# Patient Record
Sex: Female | Born: 1940 | Race: White | Hispanic: No
Health system: Southern US, Community
[De-identification: ages and names within clinical notes are randomized; demographics above are authoritative.]

## PROBLEM LIST (undated history)

## (undated) DIAGNOSIS — E785 Hyperlipidemia, unspecified: Secondary | ICD-10-CM

## (undated) DIAGNOSIS — M810 Age-related osteoporosis without current pathological fracture: Secondary | ICD-10-CM

## (undated) DIAGNOSIS — H409 Unspecified glaucoma: Secondary | ICD-10-CM

## (undated) DIAGNOSIS — K5732 Diverticulitis of large intestine without perforation or abscess without bleeding: Secondary | ICD-10-CM

## (undated) DIAGNOSIS — Z8719 Personal history of other diseases of the digestive system: Secondary | ICD-10-CM

## (undated) DIAGNOSIS — M8700 Idiopathic aseptic necrosis of unspecified bone: Secondary | ICD-10-CM

## (undated) DIAGNOSIS — I10 Essential (primary) hypertension: Secondary | ICD-10-CM

## (undated) DIAGNOSIS — K8050 Calculus of bile duct without cholangitis or cholecystitis without obstruction: Secondary | ICD-10-CM

## (undated) DIAGNOSIS — K2270 Barrett's esophagus without dysplasia: Secondary | ICD-10-CM

## (undated) DIAGNOSIS — E7800 Pure hypercholesterolemia, unspecified: Secondary | ICD-10-CM

## (undated) DIAGNOSIS — R011 Cardiac murmur, unspecified: Secondary | ICD-10-CM

## (undated) DIAGNOSIS — Z8669 Personal history of other diseases of the nervous system and sense organs: Secondary | ICD-10-CM

## (undated) DIAGNOSIS — D649 Anemia, unspecified: Secondary | ICD-10-CM

## (undated) DIAGNOSIS — K219 Gastro-esophageal reflux disease without esophagitis: Secondary | ICD-10-CM

## (undated) DIAGNOSIS — E119 Type 2 diabetes mellitus without complications: Secondary | ICD-10-CM

## (undated) DIAGNOSIS — K8309 Other cholangitis: Secondary | ICD-10-CM

## (undated) DIAGNOSIS — Z9889 Other specified postprocedural states: Secondary | ICD-10-CM

## (undated) HISTORY — DX: Calculus of bile duct without cholangitis or cholecystitis without obstruction: K80.50

## (undated) HISTORY — DX: Personal history of other diseases of the nervous system and sense organs: Z86.69

## (undated) HISTORY — PX: VAGINAL HYSTERECTOMY: SHX2639

## (undated) HISTORY — DX: Personal history of other diseases of the nervous system and sense organs: Z98.890

## (undated) HISTORY — DX: Idiopathic aseptic necrosis of unspecified bone: M87.00

## (undated) HISTORY — PX: CATARACT EXTRACTION: SUR2

## (undated) HISTORY — DX: Unspecified glaucoma: H40.9

## (undated) HISTORY — PX: TONSILLECTOMY: SUR1361

---
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
## 1990-09-30 DIAGNOSIS — H4311 Vitreous hemorrhage, right eye: Secondary | ICD-10-CM

## 1990-09-30 HISTORY — DX: Vitreous hemorrhage, right eye: H43.11

## 2005-09-30 HISTORY — PX: COLONOSCOPY: SHX174

## 2011-10-23 DIAGNOSIS — E119 Type 2 diabetes mellitus without complications: Secondary | ICD-10-CM | POA: Diagnosis not present

## 2011-10-23 DIAGNOSIS — M21969 Unspecified acquired deformity of unspecified lower leg: Secondary | ICD-10-CM | POA: Diagnosis not present

## 2011-10-23 DIAGNOSIS — B351 Tinea unguium: Secondary | ICD-10-CM | POA: Diagnosis not present

## 2012-02-18 DIAGNOSIS — Z79899 Other long term (current) drug therapy: Secondary | ICD-10-CM | POA: Diagnosis not present

## 2012-02-18 DIAGNOSIS — E119 Type 2 diabetes mellitus without complications: Secondary | ICD-10-CM | POA: Diagnosis not present

## 2012-02-18 DIAGNOSIS — I10 Essential (primary) hypertension: Secondary | ICD-10-CM | POA: Diagnosis not present

## 2012-03-10 DIAGNOSIS — I10 Essential (primary) hypertension: Secondary | ICD-10-CM | POA: Diagnosis not present

## 2012-03-10 DIAGNOSIS — Z79899 Other long term (current) drug therapy: Secondary | ICD-10-CM | POA: Diagnosis not present

## 2012-03-10 DIAGNOSIS — E119 Type 2 diabetes mellitus without complications: Secondary | ICD-10-CM | POA: Diagnosis not present

## 2012-04-13 DIAGNOSIS — R109 Unspecified abdominal pain: Secondary | ICD-10-CM | POA: Diagnosis not present

## 2012-04-13 DIAGNOSIS — D649 Anemia, unspecified: Secondary | ICD-10-CM | POA: Diagnosis not present

## 2012-04-13 DIAGNOSIS — R110 Nausea: Secondary | ICD-10-CM | POA: Diagnosis not present

## 2012-04-13 DIAGNOSIS — R112 Nausea with vomiting, unspecified: Secondary | ICD-10-CM | POA: Diagnosis not present

## 2012-04-13 DIAGNOSIS — E873 Alkalosis: Secondary | ICD-10-CM | POA: Diagnosis not present

## 2012-04-13 DIAGNOSIS — K449 Diaphragmatic hernia without obstruction or gangrene: Secondary | ICD-10-CM | POA: Diagnosis not present

## 2012-04-13 DIAGNOSIS — E876 Hypokalemia: Secondary | ICD-10-CM | POA: Diagnosis not present

## 2012-04-13 DIAGNOSIS — E860 Dehydration: Secondary | ICD-10-CM | POA: Diagnosis not present

## 2012-04-13 DIAGNOSIS — R1110 Vomiting, unspecified: Secondary | ICD-10-CM | POA: Diagnosis not present

## 2012-04-13 DIAGNOSIS — E119 Type 2 diabetes mellitus without complications: Secondary | ICD-10-CM | POA: Diagnosis not present

## 2012-04-13 DIAGNOSIS — K2270 Barrett's esophagus without dysplasia: Secondary | ICD-10-CM | POA: Diagnosis not present

## 2012-04-13 DIAGNOSIS — K8020 Calculus of gallbladder without cholecystitis without obstruction: Secondary | ICD-10-CM | POA: Diagnosis not present

## 2012-04-14 DIAGNOSIS — I10 Essential (primary) hypertension: Secondary | ICD-10-CM | POA: Diagnosis present

## 2012-04-14 DIAGNOSIS — K449 Diaphragmatic hernia without obstruction or gangrene: Secondary | ICD-10-CM | POA: Diagnosis not present

## 2012-04-14 DIAGNOSIS — M949 Disorder of cartilage, unspecified: Secondary | ICD-10-CM | POA: Diagnosis present

## 2012-04-14 DIAGNOSIS — E785 Hyperlipidemia, unspecified: Secondary | ICD-10-CM | POA: Diagnosis not present

## 2012-04-14 DIAGNOSIS — Z780 Asymptomatic menopausal state: Secondary | ICD-10-CM | POA: Diagnosis not present

## 2012-04-14 DIAGNOSIS — K2270 Barrett's esophagus without dysplasia: Secondary | ICD-10-CM | POA: Diagnosis not present

## 2012-04-14 DIAGNOSIS — H409 Unspecified glaucoma: Secondary | ICD-10-CM | POA: Diagnosis present

## 2012-04-14 DIAGNOSIS — E873 Alkalosis: Secondary | ICD-10-CM | POA: Diagnosis not present

## 2012-04-14 DIAGNOSIS — D649 Anemia, unspecified: Secondary | ICD-10-CM | POA: Diagnosis not present

## 2012-04-14 DIAGNOSIS — E119 Type 2 diabetes mellitus without complications: Secondary | ICD-10-CM | POA: Diagnosis not present

## 2012-04-14 DIAGNOSIS — R112 Nausea with vomiting, unspecified: Secondary | ICD-10-CM | POA: Diagnosis not present

## 2012-04-14 DIAGNOSIS — E8809 Other disorders of plasma-protein metabolism, not elsewhere classified: Secondary | ICD-10-CM | POA: Diagnosis not present

## 2012-04-14 DIAGNOSIS — E860 Dehydration: Secondary | ICD-10-CM | POA: Diagnosis not present

## 2012-04-14 DIAGNOSIS — K8020 Calculus of gallbladder without cholecystitis without obstruction: Secondary | ICD-10-CM | POA: Diagnosis not present

## 2012-04-14 DIAGNOSIS — R1110 Vomiting, unspecified: Secondary | ICD-10-CM | POA: Diagnosis not present

## 2012-04-14 DIAGNOSIS — K920 Hematemesis: Secondary | ICD-10-CM | POA: Diagnosis not present

## 2012-04-14 DIAGNOSIS — E876 Hypokalemia: Secondary | ICD-10-CM | POA: Diagnosis not present

## 2012-04-14 DIAGNOSIS — R918 Other nonspecific abnormal finding of lung field: Secondary | ICD-10-CM | POA: Diagnosis not present

## 2012-04-14 DIAGNOSIS — K219 Gastro-esophageal reflux disease without esophagitis: Secondary | ICD-10-CM | POA: Diagnosis present

## 2012-04-14 DIAGNOSIS — R011 Cardiac murmur, unspecified: Secondary | ICD-10-CM | POA: Diagnosis not present

## 2012-04-18 DIAGNOSIS — R609 Edema, unspecified: Secondary | ICD-10-CM | POA: Diagnosis not present

## 2012-06-13 DIAGNOSIS — Z79899 Other long term (current) drug therapy: Secondary | ICD-10-CM | POA: Diagnosis not present

## 2012-06-13 DIAGNOSIS — E119 Type 2 diabetes mellitus without complications: Secondary | ICD-10-CM | POA: Diagnosis not present

## 2012-06-13 DIAGNOSIS — S42253A Displaced fracture of greater tuberosity of unspecified humerus, initial encounter for closed fracture: Secondary | ICD-10-CM | POA: Diagnosis not present

## 2012-06-13 DIAGNOSIS — S6990XA Unspecified injury of unspecified wrist, hand and finger(s), initial encounter: Secondary | ICD-10-CM | POA: Diagnosis not present

## 2012-06-13 DIAGNOSIS — S59909A Unspecified injury of unspecified elbow, initial encounter: Secondary | ICD-10-CM | POA: Diagnosis not present

## 2012-06-13 DIAGNOSIS — I10 Essential (primary) hypertension: Secondary | ICD-10-CM | POA: Diagnosis not present

## 2012-06-13 DIAGNOSIS — S42213A Unspecified displaced fracture of surgical neck of unspecified humerus, initial encounter for closed fracture: Secondary | ICD-10-CM | POA: Diagnosis not present

## 2012-06-13 DIAGNOSIS — S42309A Unspecified fracture of shaft of humerus, unspecified arm, initial encounter for closed fracture: Secondary | ICD-10-CM | POA: Diagnosis not present

## 2012-06-13 DIAGNOSIS — S42293A Other displaced fracture of upper end of unspecified humerus, initial encounter for closed fracture: Secondary | ICD-10-CM | POA: Diagnosis not present

## 2012-06-13 DIAGNOSIS — M79609 Pain in unspecified limb: Secondary | ICD-10-CM | POA: Diagnosis not present

## 2012-06-16 DIAGNOSIS — S42293A Other displaced fracture of upper end of unspecified humerus, initial encounter for closed fracture: Secondary | ICD-10-CM | POA: Diagnosis not present

## 2012-06-23 DIAGNOSIS — S42309D Unspecified fracture of shaft of humerus, unspecified arm, subsequent encounter for fracture with routine healing: Secondary | ICD-10-CM | POA: Diagnosis not present

## 2012-06-23 DIAGNOSIS — M79609 Pain in unspecified limb: Secondary | ICD-10-CM | POA: Diagnosis not present

## 2012-06-23 DIAGNOSIS — S42209A Unspecified fracture of upper end of unspecified humerus, initial encounter for closed fracture: Secondary | ICD-10-CM | POA: Diagnosis not present

## 2012-07-06 DIAGNOSIS — S42309D Unspecified fracture of shaft of humerus, unspecified arm, subsequent encounter for fracture with routine healing: Secondary | ICD-10-CM | POA: Diagnosis not present

## 2012-07-06 DIAGNOSIS — S42309A Unspecified fracture of shaft of humerus, unspecified arm, initial encounter for closed fracture: Secondary | ICD-10-CM | POA: Diagnosis not present

## 2012-07-29 DIAGNOSIS — S42309D Unspecified fracture of shaft of humerus, unspecified arm, subsequent encounter for fracture with routine healing: Secondary | ICD-10-CM | POA: Diagnosis not present

## 2012-07-29 DIAGNOSIS — M25619 Stiffness of unspecified shoulder, not elsewhere classified: Secondary | ICD-10-CM | POA: Diagnosis not present

## 2012-07-29 DIAGNOSIS — M25519 Pain in unspecified shoulder: Secondary | ICD-10-CM | POA: Diagnosis not present

## 2012-08-06 DIAGNOSIS — Z1231 Encounter for screening mammogram for malignant neoplasm of breast: Secondary | ICD-10-CM | POA: Diagnosis not present

## 2012-08-06 DIAGNOSIS — M25519 Pain in unspecified shoulder: Secondary | ICD-10-CM | POA: Diagnosis not present

## 2012-08-12 DIAGNOSIS — M25519 Pain in unspecified shoulder: Secondary | ICD-10-CM | POA: Diagnosis not present

## 2012-08-12 DIAGNOSIS — E119 Type 2 diabetes mellitus without complications: Secondary | ICD-10-CM | POA: Diagnosis not present

## 2012-08-12 DIAGNOSIS — H338 Other retinal detachments: Secondary | ICD-10-CM | POA: Diagnosis not present

## 2012-08-12 DIAGNOSIS — H526 Other disorders of refraction: Secondary | ICD-10-CM | POA: Diagnosis not present

## 2012-08-12 DIAGNOSIS — H40019 Open angle with borderline findings, low risk, unspecified eye: Secondary | ICD-10-CM | POA: Diagnosis not present

## 2012-08-14 DIAGNOSIS — M25519 Pain in unspecified shoulder: Secondary | ICD-10-CM | POA: Diagnosis not present

## 2012-08-19 DIAGNOSIS — M25519 Pain in unspecified shoulder: Secondary | ICD-10-CM | POA: Diagnosis not present

## 2012-08-21 DIAGNOSIS — E119 Type 2 diabetes mellitus without complications: Secondary | ICD-10-CM | POA: Diagnosis not present

## 2012-08-21 DIAGNOSIS — Z23 Encounter for immunization: Secondary | ICD-10-CM | POA: Diagnosis not present

## 2012-08-21 DIAGNOSIS — I10 Essential (primary) hypertension: Secondary | ICD-10-CM | POA: Diagnosis not present

## 2012-08-21 DIAGNOSIS — E7800 Pure hypercholesterolemia, unspecified: Secondary | ICD-10-CM | POA: Diagnosis not present

## 2012-08-24 DIAGNOSIS — M25519 Pain in unspecified shoulder: Secondary | ICD-10-CM | POA: Diagnosis not present

## 2012-08-31 DIAGNOSIS — M25519 Pain in unspecified shoulder: Secondary | ICD-10-CM | POA: Diagnosis not present

## 2012-09-09 DIAGNOSIS — S42309D Unspecified fracture of shaft of humerus, unspecified arm, subsequent encounter for fracture with routine healing: Secondary | ICD-10-CM | POA: Diagnosis not present

## 2012-09-09 DIAGNOSIS — M25519 Pain in unspecified shoulder: Secondary | ICD-10-CM | POA: Diagnosis not present

## 2012-09-14 DIAGNOSIS — M25519 Pain in unspecified shoulder: Secondary | ICD-10-CM | POA: Diagnosis not present

## 2012-09-16 DIAGNOSIS — M25519 Pain in unspecified shoulder: Secondary | ICD-10-CM | POA: Diagnosis not present

## 2012-09-24 DIAGNOSIS — M25519 Pain in unspecified shoulder: Secondary | ICD-10-CM | POA: Diagnosis not present

## 2012-10-02 DIAGNOSIS — M25519 Pain in unspecified shoulder: Secondary | ICD-10-CM | POA: Diagnosis not present

## 2012-10-02 DIAGNOSIS — M25619 Stiffness of unspecified shoulder, not elsewhere classified: Secondary | ICD-10-CM | POA: Diagnosis not present

## 2012-10-07 DIAGNOSIS — M25519 Pain in unspecified shoulder: Secondary | ICD-10-CM | POA: Diagnosis not present

## 2012-10-07 DIAGNOSIS — M25619 Stiffness of unspecified shoulder, not elsewhere classified: Secondary | ICD-10-CM | POA: Diagnosis not present

## 2012-10-16 DIAGNOSIS — M25619 Stiffness of unspecified shoulder, not elsewhere classified: Secondary | ICD-10-CM | POA: Diagnosis not present

## 2012-10-16 DIAGNOSIS — M25519 Pain in unspecified shoulder: Secondary | ICD-10-CM | POA: Diagnosis not present

## 2012-10-26 DIAGNOSIS — M25619 Stiffness of unspecified shoulder, not elsewhere classified: Secondary | ICD-10-CM | POA: Diagnosis not present

## 2012-10-26 DIAGNOSIS — M25519 Pain in unspecified shoulder: Secondary | ICD-10-CM | POA: Diagnosis not present

## 2012-10-28 DIAGNOSIS — S42209A Unspecified fracture of upper end of unspecified humerus, initial encounter for closed fracture: Secondary | ICD-10-CM | POA: Diagnosis not present

## 2012-10-28 DIAGNOSIS — S42309D Unspecified fracture of shaft of humerus, unspecified arm, subsequent encounter for fracture with routine healing: Secondary | ICD-10-CM | POA: Diagnosis not present

## 2012-10-28 DIAGNOSIS — M25519 Pain in unspecified shoulder: Secondary | ICD-10-CM | POA: Diagnosis not present

## 2012-11-09 DIAGNOSIS — M25619 Stiffness of unspecified shoulder, not elsewhere classified: Secondary | ICD-10-CM | POA: Diagnosis not present

## 2012-11-09 DIAGNOSIS — M25519 Pain in unspecified shoulder: Secondary | ICD-10-CM | POA: Diagnosis not present

## 2012-11-23 DIAGNOSIS — M25519 Pain in unspecified shoulder: Secondary | ICD-10-CM | POA: Diagnosis not present

## 2012-11-23 DIAGNOSIS — M25619 Stiffness of unspecified shoulder, not elsewhere classified: Secondary | ICD-10-CM | POA: Diagnosis not present

## 2012-12-07 DIAGNOSIS — M25519 Pain in unspecified shoulder: Secondary | ICD-10-CM | POA: Diagnosis not present

## 2012-12-07 DIAGNOSIS — M25619 Stiffness of unspecified shoulder, not elsewhere classified: Secondary | ICD-10-CM | POA: Diagnosis not present

## 2012-12-30 DIAGNOSIS — S42309D Unspecified fracture of shaft of humerus, unspecified arm, subsequent encounter for fracture with routine healing: Secondary | ICD-10-CM | POA: Diagnosis not present

## 2012-12-30 DIAGNOSIS — M25519 Pain in unspecified shoulder: Secondary | ICD-10-CM | POA: Diagnosis not present

## 2013-01-10 DIAGNOSIS — R109 Unspecified abdominal pain: Secondary | ICD-10-CM | POA: Diagnosis not present

## 2013-01-10 DIAGNOSIS — K8020 Calculus of gallbladder without cholecystitis without obstruction: Secondary | ICD-10-CM | POA: Diagnosis not present

## 2013-01-10 DIAGNOSIS — K440 Diaphragmatic hernia with obstruction, without gangrene: Secondary | ICD-10-CM | POA: Diagnosis not present

## 2013-01-10 DIAGNOSIS — K922 Gastrointestinal hemorrhage, unspecified: Secondary | ICD-10-CM | POA: Diagnosis not present

## 2013-01-11 DIAGNOSIS — E785 Hyperlipidemia, unspecified: Secondary | ICD-10-CM | POA: Diagnosis present

## 2013-01-11 DIAGNOSIS — K449 Diaphragmatic hernia without obstruction or gangrene: Secondary | ICD-10-CM | POA: Diagnosis not present

## 2013-01-11 DIAGNOSIS — K8020 Calculus of gallbladder without cholecystitis without obstruction: Secondary | ICD-10-CM | POA: Diagnosis not present

## 2013-01-11 DIAGNOSIS — E119 Type 2 diabetes mellitus without complications: Secondary | ICD-10-CM | POA: Diagnosis present

## 2013-01-11 DIAGNOSIS — K440 Diaphragmatic hernia with obstruction, without gangrene: Secondary | ICD-10-CM | POA: Diagnosis not present

## 2013-01-11 DIAGNOSIS — R109 Unspecified abdominal pain: Secondary | ICD-10-CM | POA: Diagnosis not present

## 2013-01-11 DIAGNOSIS — K5900 Constipation, unspecified: Secondary | ICD-10-CM | POA: Diagnosis present

## 2013-01-11 DIAGNOSIS — Z90710 Acquired absence of both cervix and uterus: Secondary | ICD-10-CM | POA: Diagnosis not present

## 2013-01-11 DIAGNOSIS — N393 Stress incontinence (female) (male): Secondary | ICD-10-CM | POA: Diagnosis present

## 2013-01-11 DIAGNOSIS — M949 Disorder of cartilage, unspecified: Secondary | ICD-10-CM | POA: Diagnosis present

## 2013-01-11 DIAGNOSIS — E869 Volume depletion, unspecified: Secondary | ICD-10-CM | POA: Diagnosis not present

## 2013-01-11 DIAGNOSIS — I10 Essential (primary) hypertension: Secondary | ICD-10-CM | POA: Diagnosis present

## 2013-01-11 DIAGNOSIS — H409 Unspecified glaucoma: Secondary | ICD-10-CM | POA: Diagnosis present

## 2013-01-11 DIAGNOSIS — K2270 Barrett's esophagus without dysplasia: Secondary | ICD-10-CM | POA: Diagnosis not present

## 2013-01-11 DIAGNOSIS — K5730 Diverticulosis of large intestine without perforation or abscess without bleeding: Secondary | ICD-10-CM | POA: Diagnosis present

## 2013-01-11 DIAGNOSIS — K219 Gastro-esophageal reflux disease without esophagitis: Secondary | ICD-10-CM | POA: Diagnosis present

## 2013-01-11 DIAGNOSIS — K922 Gastrointestinal hemorrhage, unspecified: Secondary | ICD-10-CM | POA: Insufficient documentation

## 2013-01-19 DIAGNOSIS — K2270 Barrett's esophagus without dysplasia: Secondary | ICD-10-CM | POA: Diagnosis not present

## 2013-01-19 DIAGNOSIS — K922 Gastrointestinal hemorrhage, unspecified: Secondary | ICD-10-CM | POA: Diagnosis not present

## 2013-01-19 DIAGNOSIS — Z79899 Other long term (current) drug therapy: Secondary | ICD-10-CM | POA: Diagnosis not present

## 2013-01-19 DIAGNOSIS — I10 Essential (primary) hypertension: Secondary | ICD-10-CM | POA: Diagnosis not present

## 2013-01-19 DIAGNOSIS — K449 Diaphragmatic hernia without obstruction or gangrene: Secondary | ICD-10-CM | POA: Diagnosis not present

## 2013-01-19 DIAGNOSIS — D649 Anemia, unspecified: Secondary | ICD-10-CM | POA: Diagnosis not present

## 2013-02-04 DIAGNOSIS — E119 Type 2 diabetes mellitus without complications: Secondary | ICD-10-CM | POA: Diagnosis not present

## 2013-02-04 DIAGNOSIS — I10 Essential (primary) hypertension: Secondary | ICD-10-CM | POA: Diagnosis not present

## 2013-02-04 DIAGNOSIS — Z79899 Other long term (current) drug therapy: Secondary | ICD-10-CM | POA: Diagnosis not present

## 2013-02-15 DIAGNOSIS — M76899 Other specified enthesopathies of unspecified lower limb, excluding foot: Secondary | ICD-10-CM | POA: Diagnosis not present

## 2013-02-28 HISTORY — PX: UPPER GASTROINTESTINAL ENDOSCOPY: SHX188

## 2013-03-01 DIAGNOSIS — S7010XA Contusion of unspecified thigh, initial encounter: Secondary | ICD-10-CM | POA: Diagnosis not present

## 2013-03-01 DIAGNOSIS — M5137 Other intervertebral disc degeneration, lumbosacral region: Secondary | ICD-10-CM | POA: Diagnosis not present

## 2013-03-01 DIAGNOSIS — M79609 Pain in unspecified limb: Secondary | ICD-10-CM | POA: Diagnosis not present

## 2013-03-01 DIAGNOSIS — IMO0002 Reserved for concepts with insufficient information to code with codable children: Secondary | ICD-10-CM | POA: Diagnosis not present

## 2013-03-01 DIAGNOSIS — W19XXXA Unspecified fall, initial encounter: Secondary | ICD-10-CM | POA: Diagnosis not present

## 2013-03-16 DIAGNOSIS — K449 Diaphragmatic hernia without obstruction or gangrene: Secondary | ICD-10-CM | POA: Diagnosis not present

## 2013-03-16 DIAGNOSIS — K2270 Barrett's esophagus without dysplasia: Secondary | ICD-10-CM | POA: Diagnosis not present

## 2013-08-16 DIAGNOSIS — H40019 Open angle with borderline findings, low risk, unspecified eye: Secondary | ICD-10-CM | POA: Diagnosis not present

## 2013-08-16 DIAGNOSIS — H338 Other retinal detachments: Secondary | ICD-10-CM | POA: Diagnosis not present

## 2013-08-16 DIAGNOSIS — Z961 Presence of intraocular lens: Secondary | ICD-10-CM | POA: Diagnosis not present

## 2013-08-16 DIAGNOSIS — E119 Type 2 diabetes mellitus without complications: Secondary | ICD-10-CM | POA: Diagnosis not present

## 2013-08-17 DIAGNOSIS — Z1231 Encounter for screening mammogram for malignant neoplasm of breast: Secondary | ICD-10-CM | POA: Diagnosis not present

## 2013-08-19 DIAGNOSIS — E119 Type 2 diabetes mellitus without complications: Secondary | ICD-10-CM | POA: Diagnosis not present

## 2013-08-19 DIAGNOSIS — Z23 Encounter for immunization: Secondary | ICD-10-CM | POA: Diagnosis not present

## 2013-08-19 DIAGNOSIS — Z79899 Other long term (current) drug therapy: Secondary | ICD-10-CM | POA: Diagnosis not present

## 2013-08-19 DIAGNOSIS — I10 Essential (primary) hypertension: Secondary | ICD-10-CM | POA: Diagnosis not present

## 2013-09-13 DIAGNOSIS — J111 Influenza due to unidentified influenza virus with other respiratory manifestations: Secondary | ICD-10-CM | POA: Diagnosis not present

## 2013-09-13 DIAGNOSIS — R509 Fever, unspecified: Secondary | ICD-10-CM | POA: Diagnosis not present

## 2014-02-17 DIAGNOSIS — E119 Type 2 diabetes mellitus without complications: Secondary | ICD-10-CM | POA: Diagnosis not present

## 2014-02-17 DIAGNOSIS — Z780 Asymptomatic menopausal state: Secondary | ICD-10-CM | POA: Diagnosis not present

## 2014-02-17 DIAGNOSIS — I10 Essential (primary) hypertension: Secondary | ICD-10-CM | POA: Diagnosis not present

## 2014-02-17 DIAGNOSIS — Z79899 Other long term (current) drug therapy: Secondary | ICD-10-CM | POA: Diagnosis not present

## 2014-02-24 DIAGNOSIS — M810 Age-related osteoporosis without current pathological fracture: Secondary | ICD-10-CM | POA: Diagnosis not present

## 2014-02-24 DIAGNOSIS — Z780 Asymptomatic menopausal state: Secondary | ICD-10-CM | POA: Diagnosis not present

## 2014-04-18 DIAGNOSIS — E785 Hyperlipidemia, unspecified: Secondary | ICD-10-CM | POA: Diagnosis not present

## 2014-04-18 DIAGNOSIS — E119 Type 2 diabetes mellitus without complications: Secondary | ICD-10-CM | POA: Diagnosis not present

## 2014-04-18 DIAGNOSIS — M810 Age-related osteoporosis without current pathological fracture: Secondary | ICD-10-CM | POA: Diagnosis not present

## 2014-04-18 DIAGNOSIS — I10 Essential (primary) hypertension: Secondary | ICD-10-CM | POA: Diagnosis not present

## 2014-06-20 DIAGNOSIS — D539 Nutritional anemia, unspecified: Secondary | ICD-10-CM | POA: Diagnosis not present

## 2014-06-20 DIAGNOSIS — E785 Hyperlipidemia, unspecified: Secondary | ICD-10-CM | POA: Diagnosis not present

## 2014-06-20 DIAGNOSIS — M810 Age-related osteoporosis without current pathological fracture: Secondary | ICD-10-CM | POA: Diagnosis not present

## 2014-06-20 DIAGNOSIS — Z0000 Encounter for general adult medical examination without abnormal findings: Secondary | ICD-10-CM | POA: Diagnosis not present

## 2014-06-20 DIAGNOSIS — R7989 Other specified abnormal findings of blood chemistry: Secondary | ICD-10-CM | POA: Diagnosis not present

## 2014-06-20 DIAGNOSIS — I10 Essential (primary) hypertension: Secondary | ICD-10-CM | POA: Diagnosis not present

## 2014-06-20 DIAGNOSIS — E119 Type 2 diabetes mellitus without complications: Secondary | ICD-10-CM | POA: Diagnosis not present

## 2014-06-20 DIAGNOSIS — Z23 Encounter for immunization: Secondary | ICD-10-CM | POA: Diagnosis not present

## 2014-06-20 DIAGNOSIS — R946 Abnormal results of thyroid function studies: Secondary | ICD-10-CM | POA: Diagnosis not present

## 2014-06-20 LAB — LIPID PANEL
Cholesterol: 151 mg/dL (ref 0–200)
HDL: 45 mg/dL (ref 35–70)
LDL Cholesterol: 92 mg/dL
TRIGLYCERIDES: 70 mg/dL (ref 40–160)

## 2014-06-20 LAB — BASIC METABOLIC PANEL
BUN: 20 mg/dL (ref 4–21)
CREATININE: 0.8 mg/dL (ref <=1.1)

## 2014-06-20 LAB — CBC AND DIFFERENTIAL: Hemoglobin: 13.2 g/dL (ref 12.0–16.0)

## 2014-06-20 LAB — TSH: TSH: 5.4 u[IU]/mL (ref <=5.90)

## 2014-08-17 DIAGNOSIS — Z23 Encounter for immunization: Secondary | ICD-10-CM | POA: Diagnosis not present

## 2014-08-23 DIAGNOSIS — H338 Other retinal detachments: Secondary | ICD-10-CM | POA: Diagnosis not present

## 2014-08-23 DIAGNOSIS — E119 Type 2 diabetes mellitus without complications: Secondary | ICD-10-CM | POA: Diagnosis not present

## 2014-08-23 DIAGNOSIS — Z961 Presence of intraocular lens: Secondary | ICD-10-CM | POA: Diagnosis not present

## 2014-08-23 DIAGNOSIS — H40013 Open angle with borderline findings, low risk, bilateral: Secondary | ICD-10-CM | POA: Diagnosis not present

## 2014-10-26 ENCOUNTER — Ambulatory Visit: Payer: Self-pay | Admitting: Internal Medicine

## 2014-10-26 DIAGNOSIS — I10 Essential (primary) hypertension: Secondary | ICD-10-CM | POA: Diagnosis not present

## 2014-10-26 DIAGNOSIS — K2270 Barrett's esophagus without dysplasia: Secondary | ICD-10-CM | POA: Diagnosis not present

## 2014-10-26 DIAGNOSIS — E119 Type 2 diabetes mellitus without complications: Secondary | ICD-10-CM | POA: Diagnosis not present

## 2014-10-26 DIAGNOSIS — Z1231 Encounter for screening mammogram for malignant neoplasm of breast: Secondary | ICD-10-CM | POA: Diagnosis not present

## 2014-10-26 DIAGNOSIS — B373 Candidiasis of vulva and vagina: Secondary | ICD-10-CM | POA: Diagnosis not present

## 2014-10-26 DIAGNOSIS — E782 Mixed hyperlipidemia: Secondary | ICD-10-CM | POA: Diagnosis not present

## 2015-01-13 DIAGNOSIS — D1801 Hemangioma of skin and subcutaneous tissue: Secondary | ICD-10-CM | POA: Diagnosis not present

## 2015-01-13 DIAGNOSIS — L821 Other seborrheic keratosis: Secondary | ICD-10-CM | POA: Diagnosis not present

## 2015-02-24 DIAGNOSIS — E119 Type 2 diabetes mellitus without complications: Secondary | ICD-10-CM | POA: Diagnosis not present

## 2015-02-24 DIAGNOSIS — I10 Essential (primary) hypertension: Secondary | ICD-10-CM | POA: Diagnosis not present

## 2015-02-24 DIAGNOSIS — K2270 Barrett's esophagus without dysplasia: Secondary | ICD-10-CM | POA: Diagnosis not present

## 2015-02-24 DIAGNOSIS — M810 Age-related osteoporosis without current pathological fracture: Secondary | ICD-10-CM | POA: Diagnosis not present

## 2015-02-24 DIAGNOSIS — E782 Mixed hyperlipidemia: Secondary | ICD-10-CM | POA: Diagnosis not present

## 2015-02-24 LAB — HEMOGLOBIN A1C: Hgb A1c MFr Bld: 6.1 % — AB (ref 4.0–6.0)

## 2015-05-29 ENCOUNTER — Encounter: Payer: Self-pay | Admitting: Internal Medicine

## 2015-06-07 DIAGNOSIS — E119 Type 2 diabetes mellitus without complications: Secondary | ICD-10-CM | POA: Diagnosis not present

## 2015-06-07 DIAGNOSIS — Z961 Presence of intraocular lens: Secondary | ICD-10-CM | POA: Diagnosis not present

## 2015-06-07 DIAGNOSIS — H338 Other retinal detachments: Secondary | ICD-10-CM | POA: Diagnosis not present

## 2015-06-16 ENCOUNTER — Encounter: Payer: Self-pay | Admitting: Internal Medicine

## 2015-06-16 ENCOUNTER — Other Ambulatory Visit: Payer: Self-pay | Admitting: Internal Medicine

## 2015-06-16 DIAGNOSIS — D539 Nutritional anemia, unspecified: Secondary | ICD-10-CM | POA: Insufficient documentation

## 2015-06-16 DIAGNOSIS — E785 Hyperlipidemia, unspecified: Secondary | ICD-10-CM

## 2015-06-16 DIAGNOSIS — K2270 Barrett's esophagus without dysplasia: Secondary | ICD-10-CM | POA: Insufficient documentation

## 2015-06-16 DIAGNOSIS — E1169 Type 2 diabetes mellitus with other specified complication: Secondary | ICD-10-CM | POA: Insufficient documentation

## 2015-06-16 DIAGNOSIS — E119 Type 2 diabetes mellitus without complications: Secondary | ICD-10-CM | POA: Insufficient documentation

## 2015-06-16 DIAGNOSIS — M810 Age-related osteoporosis without current pathological fracture: Secondary | ICD-10-CM | POA: Insufficient documentation

## 2015-06-16 DIAGNOSIS — I10 Essential (primary) hypertension: Secondary | ICD-10-CM | POA: Insufficient documentation

## 2015-06-23 ENCOUNTER — Other Ambulatory Visit: Payer: Self-pay | Admitting: Internal Medicine

## 2015-06-23 ENCOUNTER — Ambulatory Visit (INDEPENDENT_AMBULATORY_CARE_PROVIDER_SITE_OTHER): Payer: Medicare Other | Admitting: Internal Medicine

## 2015-06-23 ENCOUNTER — Encounter: Payer: Self-pay | Admitting: Internal Medicine

## 2015-06-23 VITALS — BP 122/80 | HR 64 | Ht 63.5 in | Wt 156.8 lb

## 2015-06-23 DIAGNOSIS — M810 Age-related osteoporosis without current pathological fracture: Secondary | ICD-10-CM | POA: Diagnosis not present

## 2015-06-23 DIAGNOSIS — E119 Type 2 diabetes mellitus without complications: Secondary | ICD-10-CM | POA: Diagnosis not present

## 2015-06-23 DIAGNOSIS — Z1239 Encounter for other screening for malignant neoplasm of breast: Secondary | ICD-10-CM

## 2015-06-23 DIAGNOSIS — I10 Essential (primary) hypertension: Secondary | ICD-10-CM

## 2015-06-23 DIAGNOSIS — D539 Nutritional anemia, unspecified: Secondary | ICD-10-CM

## 2015-06-23 DIAGNOSIS — K2270 Barrett's esophagus without dysplasia: Secondary | ICD-10-CM | POA: Diagnosis not present

## 2015-06-23 DIAGNOSIS — E782 Mixed hyperlipidemia: Secondary | ICD-10-CM

## 2015-06-23 DIAGNOSIS — Z0000 Encounter for general adult medical examination without abnormal findings: Secondary | ICD-10-CM | POA: Diagnosis not present

## 2015-06-23 DIAGNOSIS — Z23 Encounter for immunization: Secondary | ICD-10-CM | POA: Diagnosis not present

## 2015-06-23 DIAGNOSIS — Z1231 Encounter for screening mammogram for malignant neoplasm of breast: Secondary | ICD-10-CM

## 2015-06-23 NOTE — Patient Instructions (Signed)
Health Maintenance  Topic Date Due  . OPHTHALMOLOGY EXAM  07/02/1950  . INFLUENZA VACCINE  today  . HEMOGLOBIN A1C  today  . URINE MICROALBUMIN  today  . FOOT EXAM  today  . COLONOSCOPY  10/01/2016  . MAMMOGRAM  annually  . TETANUS/TDAP  10/01/2020  . DEXA SCAN  Addressed  . ZOSTAVAX  Addressed  . PNA vac Low Risk Adult  Completed

## 2015-06-23 NOTE — Progress Notes (Signed)
Patient: Lori Harrell, Female    DOB: August 05, 1941, 74 y.o.   MRN: [id number] Visit Date: 06/23/2015  Today's Provider: Halina Maidens, MD   Chief Complaint  Patient presents with  . Medicare Wellness  . Hypertension  . Diabetes  . Hyperlipidemia  . Gastrophageal Reflux   Subjective:    Annual wellness visit Lori Harrell is a 74 y.o. female who presents today for her Subsequent Annual Wellness Visit. She feels well. She reports exercising by walking. She reports she is sleeping well.   ----------------------------------------------------------- Hypertension This is a chronic problem. The current episode started more than 1 year ago. The problem is unchanged. The problem is controlled. Pertinent negatives include no chest pain, headaches or palpitations. There are no associated agents to hypertension.  Diabetes She presents for her follow-up diabetic visit. She has type 2 diabetes mellitus. Pertinent negatives for hypoglycemia include no dizziness, headaches or tremors. Pertinent negatives for diabetes include no chest pain, no fatigue and no weakness. Current diabetic treatment includes oral agent (monotherapy). She is compliant with treatment all of the time. She is following a diabetic diet. There is no change in her home blood glucose trend. Her breakfast blood glucose is taken between 6-7 am. Her breakfast blood glucose range is generally 90-110 mg/dl.  Hyperlipidemia The current episode started more than 1 year ago. The problem is controlled. Recent lipid tests were reviewed and are normal. Pertinent negatives include no chest pain. Current antihyperlipidemic treatment includes statins. The current treatment provides significant improvement of lipids. There are no compliance problems.   Gastrophageal Reflux She reports no chest pain, no choking, no sore throat or no wheezing. This is a chronic problem. The current episode started more than 1 year ago. Pertinent negatives  include no fatigue.    Review of Systems  Constitutional: Negative for fever, chills, fatigue and unexpected weight change.  HENT: Negative for hearing loss, sore throat, trouble swallowing and voice change.   Eyes: Negative for visual disturbance.  Respiratory: Negative for choking, chest tightness, wheezing and stridor.   Cardiovascular: Negative for chest pain, palpitations and leg swelling.  Gastrointestinal: Negative for diarrhea and constipation.  Genitourinary: Negative for dysuria, hematuria and vaginal bleeding.  Musculoskeletal: Positive for back pain. Negative for arthralgias.  Neurological: Negative for dizziness, tremors, weakness and headaches.  Hematological: Negative for adenopathy.  Psychiatric/Behavioral: Negative for dysphoric mood and decreased concentration.    Social History   Social History  . Marital Status: Widowed    Spouse Name: N/A  . Number of Children: N/A  . Years of Education: N/A   Occupational History  . Not on file.   Social History Main Topics  . Smoking status: Never Smoker   . Smokeless tobacco: Not on file  . Alcohol Use: No  . Drug Use: Not on file  . Sexual Activity: Not on file   Other Topics Concern  . Not on file   Social History Narrative    Patient Active Problem List   Diagnosis Date Noted  . Anemia, deficiency 06/16/2015  . 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 Controlled diabetes mellitus type II without complication [id number]/5449  . Essential (primary) hypertension 06/16/2015  . 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 OP (osteoporosis) 06/16/2015    Past Surgical History  Procedure Laterality Date  . Vaginal hysterectomy    . Tonsillectomy    . Cataract extraction      Her family history includes CAD in her father; Diabetes in her father.  Previous Medications   ACETAMINOPHEN-CAFFEINE (EXCEDRIN TENSION HEADACHE) 500-65 MG TABS    Take by mouth.   ASPIRIN 81 MG TABLET    Take by mouth.    ATORVASTATIN (LIPITOR) 20 MG TABLET    Take 1 tablet by mouth daily.   ENALAPRIL (VASOTEC) 20 MG TABLET    Take 1 tablet by mouth daily.   FERROUS SULFATE (IRON SUPPLEMENT) 325 (65 FE) MG TABLET    Take 1 tablet by mouth daily.   GLUCOSE BLOOD (BAYER CONTOUR NEXT TEST) TEST STRIP    BAYER CONTOUR NEXT TEST (In Vitro Strip) - Historical Medication Active   METFORMIN (GLUCOPHAGE) 500 MG TABLET    Take 1 tablet by mouth daily.   MICROLET LANCETS MISC    MICROLET LANCETS (Miscellaneous) - Historical Medication Active   MULTIPLE VITAMINS-MINERALS PO    Take by mouth.   NEXIUM 20 MG CAPSULE    Take 1 tablet by mouth daily.   OMEGA 3 1200 MG CAPS    Take 1 capsule by mouth daily.   SPIRONOLACTONE (ALDACTONE) 25 MG TABLET    Take 1 tablet by mouth daily.   TIMOLOL (TIMOPTIC) 0.5 % OPHTHALMIC SOLUTION        Patient Care Team: Glean Hess, MD as PCP - General (Family Medicine)     Objective:   Vitals: BP 122/80 mmHg  Pulse 64  Ht 5' 3.5" (1.613 m)  Wt 156 lb 12.8 oz (71.124 kg)  BMI 27.34 kg/m2  Physical Exam  Constitutional: She is oriented to person, place, and time. She appears well-developed and well-nourished. No distress.  HENT:  Head: Normocephalic and atraumatic.  Eyes: Conjunctivae are normal. Pupils are equal, round, and reactive to light. Right eye exhibits no discharge. Left eye exhibits no discharge. No scleral icterus.  Neck: Neck supple. No thyromegaly present.  Cardiovascular: Normal rate, regular rhythm and normal heart sounds.   Pulmonary/Chest: Effort normal and breath sounds normal. No respiratory distress. She has no wheezes. She exhibits no tenderness. Right breast exhibits no mass, no nipple discharge, no skin change and no tenderness. Left breast exhibits no mass, no nipple discharge, no skin change and no tenderness.  Abdominal: Soft. Bowel sounds are normal. She exhibits no mass. There is no tenderness. There is no rebound and no guarding.  Musculoskeletal:  Normal range of motion. She exhibits no edema or tenderness.       Lumbar back: She exhibits bony tenderness and deformity (thoracic kyphosis). She exhibits no edema.  Lymphadenopathy:    She has no cervical adenopathy.    She has no axillary adenopathy.  Neurological: She is alert and oriented to person, place, and time. She has normal reflexes.  Skin: Skin is warm and dry. No rash noted.  Psychiatric: She has a normal mood and affect. Her speech is normal and behavior is normal. Thought content normal. Cognition and memory are normal.  Nursing note and vitals reviewed.   Activities of Daily Living In your present state of health, do you have any difficulty performing the following activities: 06/23/2015  Hearing? N  Vision? N  Difficulty concentrating or making decisions? N  Walking or climbing stairs? N  Dressing or bathing? N  Doing errands, shopping? N    Fall Risk Assessment Fall Risk  06/23/2015  Falls in the past year? No     Patient reports there are safety devices in place in shower at home.   Depression Screen PHQ 2/9 Scores 06/23/2015  PHQ - 2 Score  0    Cognitive Testing - 6-CIT   Correct? Score   What year is it? yes 0 Yes = 0    No = 4  What month is it? yes 0 Yes = 0    No = 3  Remember:     [address]ia [address], [place], [place]     What time is it? yes 0 Yes = 0    No = 3  Count backwards from 20 to 1 yes 0 Correct = 0    1 error = 2   More than 1 error = 4  Say the months of the year in reverse. yes 0 Correct = 0    1 error = 2   More than 1 error = 4  What address did I ask you to remember? yes 0 Correct = 0  1 error = 2    2 error = 4    3 error = 6    4 error = 8    All wrong = 10       TOTAL SCORE  0/28   Interpretation:  Normal  Normal (0-7) Abnormal (8-28)        Assessment & Plan:     Annual Wellness Visit  Reviewed patient's Family Medical History Reviewed and updated list of patient's medical providers Assessment of cognitive  impairment was done Assessed patient's functional ability Established a written schedule for health screening [place] Completed and Reviewed  Exercise Activities and Dietary recommendations Goals    None      Immunization History  Administered Date(s) Administered  . Pneumococcal Conjugate-13 06/20/2014  . Pneumococcal Polysaccharide-23 10/02/2007  . Tdap 10/01/2010    Health Maintenance  Topic Date Due  . OPHTHALMOLOGY EXAM  07/02/1950  . INFLUENZA VACCINE  05/01/2015  . FOOT EXAM  06/21/2015  . HEMOGLOBIN A1C  08/27/2015  . URINE MICROALBUMIN  10/27/2015  . COLONOSCOPY  10/01/2016  . MAMMOGRAM  10/26/2016  . TETANUS/TDAP  10/01/2020  . DEXA SCAN  Addressed  . ZOSTAVAX  Addressed  . PNA vac Low Risk Adult  Completed     Discussed health benefits of physical activity, and encouraged her to engage in regular exercise appropriate for her age and condition.    ------------------------------------------------------------------------------------------------------------  1. Medicare annual wellness visit, subsequent Visit completed Advanced directive information is provided  2. Essential (primary) hypertension Controlled on medication - TSH  3. Controlled diabetes mellitus type II without complication Doing well on current regimen; encouraged regular exercise such as walking - Comprehensive metabolic panel - Hemoglobin A1c - Microalbumin / creatinine urine ratio  4. Combined fat and carbohydrate induced hyperlipemia Controlled on statin therapy - Lipid panel  5. Barrett esophagus Last EGD was 2014; follow-up at [facility] next year; continue daily PPI - CBC with Differential/Platelet  6. Anemia, deficiency Recently stable; check labs and advise - CBC with Differential/Platelet  7. Flu vaccine need - Flu Vaccine QUAD 36+ mos PF IM (Fluarix & Fluzone Quad PF)  8. OP (osteoporosis) Continue multivitamin with calcium; encouraged regular  weightbearing exercise  9. Breast cancer screening Patient will schedule mammogram annually   Halina Maidens, MD [facility]  06/23/2015

## 2015-06-24 LAB — COMPREHENSIVE METABOLIC PANEL
A/G RATIO: 2.2 (ref 1.1–2.5)
ALBUMIN: 4.8 g/dL (ref 3.5–4.8)
ALT: 21 IU/L (ref 0–32)
AST: 21 IU/L (ref 0–40)
Alkaline Phosphatase: 59 IU/L (ref 39–117)
BILIRUBIN TOTAL: 0.6 mg/dL (ref 0.0–1.2)
BUN / CREAT RATIO: 19 (ref 11–26)
BUN: 18 mg/dL (ref 8–27)
CHLORIDE: 96 mmol/L — AB (ref 97–108)
CO2: 24 mmol/L (ref 18–29)
Calcium: 10.3 mg/dL (ref 8.7–10.3)
Creatinine, Ser: 0.93 mg/dL (ref 0.57–1.00)
GFR calc non Af Amer: 61 mL/min/{1.73_m2} (ref >59)
GFR, EST AFRICAN AMERICAN: 70 mL/min/{1.73_m2} (ref >59)
Globulin, Total: 2.2 g/dL (ref 1.5–4.5)
Glucose: 105 mg/dL — ABNORMAL HIGH (ref 65–99)
POTASSIUM: 4.5 mmol/L (ref 3.5–5.2)
Sodium: 137 mmol/L (ref 134–144)
TOTAL PROTEIN: 7 g/dL (ref 6.0–8.5)

## 2015-06-24 LAB — CBC WITH DIFFERENTIAL/PLATELET
BASOS: 1 %
Basophils Absolute: 0 10*3/uL (ref 0.0–0.2)
EOS (ABSOLUTE): 0.2 10*3/uL (ref 0.0–0.4)
EOS: 2 %
HEMATOCRIT: 40.3 % (ref 34.0–46.6)
Hemoglobin: 13.4 g/dL (ref 11.1–15.9)
Immature Grans (Abs): 0 10*3/uL (ref 0.0–0.1)
Immature Granulocytes: 0 %
LYMPHS ABS: 1.4 10*3/uL (ref 0.7–3.1)
Lymphs: 17 %
MCH: 31.3 pg (ref 26.6–33.0)
MCHC: 33.3 g/dL (ref 31.5–35.7)
MCV: 94 fL (ref 79–97)
MONOS ABS: 0.5 10*3/uL (ref 0.1–0.9)
Monocytes: 6 %
Neutrophils Absolute: 6.1 10*3/uL (ref 1.4–7.0)
Neutrophils: 74 %
Platelets: 258 10*3/uL (ref 150–379)
RBC: 4.28 x10E6/uL (ref 3.77–5.28)
RDW: 13.3 % (ref 12.3–15.4)
WBC: 8.1 10*3/uL (ref 3.4–10.8)

## 2015-06-24 LAB — HEMOGLOBIN A1C
Est. average glucose Bld gHb Est-mCnc: 140 mg/dL
Hgb A1c MFr Bld: 6.5 % — ABNORMAL HIGH (ref 4.8–5.6)

## 2015-06-24 LAB — LIPID PANEL
CHOL/HDL RATIO: 3.7 ratio (ref 0.0–4.4)
Cholesterol, Total: 158 mg/dL (ref 100–199)
HDL: 43 mg/dL (ref >39)
LDL CALC: 95 mg/dL (ref 0–99)
Triglycerides: 102 mg/dL (ref 0–149)
VLDL Cholesterol Cal: 20 mg/dL (ref 5–40)

## 2015-06-24 LAB — TSH: TSH: 5.38 u[IU]/mL — ABNORMAL HIGH (ref 0.450–4.500)

## 2015-06-24 LAB — MICROALBUMIN / CREATININE URINE RATIO
CREATININE, UR: 90.2 mg/dL
MICROALB/CREAT RATIO: 5.4 mg/g creat (ref 0.0–30.0)
MICROALBUM., U, RANDOM: 4.9 ug/mL

## 2015-08-28 DIAGNOSIS — Z961 Presence of intraocular lens: Secondary | ICD-10-CM | POA: Diagnosis not present

## 2015-08-28 DIAGNOSIS — H338 Other retinal detachments: Secondary | ICD-10-CM | POA: Diagnosis not present

## 2015-08-28 DIAGNOSIS — H1045 Other chronic allergic conjunctivitis: Secondary | ICD-10-CM | POA: Diagnosis not present

## 2015-08-28 DIAGNOSIS — E119 Type 2 diabetes mellitus without complications: Secondary | ICD-10-CM | POA: Diagnosis not present

## 2015-08-28 DIAGNOSIS — H401121 Primary open-angle glaucoma, left eye, mild stage: Secondary | ICD-10-CM | POA: Diagnosis not present

## 2015-08-28 DIAGNOSIS — H401113 Primary open-angle glaucoma, right eye, severe stage: Secondary | ICD-10-CM | POA: Diagnosis not present

## 2015-09-26 DIAGNOSIS — H40013 Open angle with borderline findings, low risk, bilateral: Secondary | ICD-10-CM | POA: Diagnosis not present

## 2015-09-26 DIAGNOSIS — Z961 Presence of intraocular lens: Secondary | ICD-10-CM | POA: Diagnosis not present

## 2015-10-30 ENCOUNTER — Ambulatory Visit: Payer: Self-pay | Admitting: Internal Medicine

## 2015-10-31 ENCOUNTER — Ambulatory Visit
Admission: RE | Admit: 2015-10-31 | Discharge: 2015-10-31 | Disposition: A | Discharge status: Home / Self Care | Payer: Medicare Other | Source: Ambulatory Visit | Attending: Internal Medicine | Admitting: Internal Medicine

## 2015-10-31 ENCOUNTER — Ambulatory Visit (INDEPENDENT_AMBULATORY_CARE_PROVIDER_SITE_OTHER): Payer: Medicare Other | Admitting: Internal Medicine

## 2015-10-31 ENCOUNTER — Encounter: Payer: Self-pay | Admitting: Internal Medicine

## 2015-10-31 ENCOUNTER — Ambulatory Visit: Payer: Self-pay | Admitting: Internal Medicine

## 2015-10-31 VITALS — BP 142/86 | HR 72 | Ht 63.5 in | Wt 155.4 lb

## 2015-10-31 DIAGNOSIS — Z1231 Encounter for screening mammogram for malignant neoplasm of breast: Secondary | ICD-10-CM | POA: Diagnosis not present

## 2015-10-31 DIAGNOSIS — I10 Essential (primary) hypertension: Secondary | ICD-10-CM | POA: Diagnosis not present

## 2015-10-31 DIAGNOSIS — D539 Nutritional anemia, unspecified: Secondary | ICD-10-CM | POA: Diagnosis not present

## 2015-10-31 DIAGNOSIS — K5900 Constipation, unspecified: Secondary | ICD-10-CM

## 2015-10-31 DIAGNOSIS — E119 Type 2 diabetes mellitus without complications: Secondary | ICD-10-CM

## 2015-10-31 DIAGNOSIS — E118 Type 2 diabetes mellitus with unspecified complications: Secondary | ICD-10-CM | POA: Insufficient documentation

## 2015-10-31 NOTE — Progress Notes (Signed)
Date:  10/31/2015   Name:  Lori Harrell   DOB:  1940/10/07   MRN:  [id number]   Chief Complaint: Follow-up; Diabetes; Hypertension; and Hyperlipidemia Diabetes She presents for her follow-up diabetic visit. She has type 2 diabetes mellitus. Her disease course has been stable. Pertinent negatives for hypoglycemia include no headaches or tremors. Sweats: constipationconstipationconstipation. Pertinent negatives for diabetes include no chest pain, no fatigue, no polydipsia and no polyuria. Current diabetic treatment includes oral agent (monotherapy). She is compliant with treatment all of the time. There is no change in her home blood glucose trend. Her breakfast blood glucose is taken between 6-7 am. Her breakfast blood glucose range is generally 90-110 mg/dl. An ACE inhibitor/angiotensin II receptor blocker is being taken. Eye exam is current.  Hypertension This is a chronic problem. The current episode started more than 1 year ago. The problem is unchanged. The problem is controlled. Pertinent negatives include no chest pain, headaches, palpitations or shortness of breath. Sweats: constipationconstipationconstipation. Past treatments include ACE inhibitors and diuretics. The current treatment provides significant improvement.  Constipation This is a recurrent problem. The problem has been waxing and waning since onset. The stool is described as pellet like. The patient is on a high fiber diet. She does not exercise regularly. There has been adequate water intake. Pertinent negatives include no abdominal pain or fever. She has tried diet changes for the symptoms. The treatment provided mild relief.     Review of Systems  Constitutional: Negative for fever, appetite change, fatigue and unexpected weight change.  HENT: Negative for tinnitus and trouble swallowing.   Eyes: Negative for visual disturbance.  Respiratory: Negative for cough, chest tightness and shortness of breath.     Cardiovascular: Negative for chest pain, palpitations and leg swelling.  Gastrointestinal: Positive for constipation. Negative for abdominal pain.  Endocrine: Negative for polydipsia and polyuria.  Genitourinary: Negative for dysuria and hematuria.  Musculoskeletal: Negative for arthralgias.  Neurological: Negative for tremors, numbness and headaches.  Psychiatric/Behavioral: Negative for dysphoric mood.    Patient Active Problem List   Diagnosis Date Noted  . Controlled type 2 diabetes mellitus without complication, without long-term current use of insulin ([place]) 10/31/2015  . Constipation 10/31/2015  . Anemia, deficiency 06/16/2015  . 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 Essential (primary) hypertension 06/16/2015  . 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 OP (osteoporosis) 06/16/2015    Prior to Admission medications   Medication Sig Start Date End Date Taking? Authorizing Provider  Acetaminophen-Caffeine (EXCEDRIN TENSION HEADACHE) 500-65 MG TABS Take by mouth.   Yes Historical Provider, MD  aspirin 81 MG tablet Take by mouth.   Yes Historical Provider, MD  atorvastatin (LIPITOR) 20 MG tablet Take 1 tablet by mouth daily. 10/26/14  Yes Historical Provider, MD  enalapril (VASOTEC) 20 MG tablet Take 1 tablet by mouth daily. 10/26/14  Yes Historical Provider, MD  ferrous sulfate (IRON SUPPLEMENT) 325 (65 FE) MG tablet Take 1 tablet by mouth daily.   Yes Historical Provider, MD  glucose blood (BAYER CONTOUR NEXT TEST) test strip BAYER CONTOUR NEXT TEST (In Vitro Strip) - Historical Medication Active   Yes Historical Provider, MD  latanoprost (XALATAN) 0.005 % ophthalmic solution Place 1 drop into both eyes at bedtime. 10/09/15  Yes Historical Provider, MD  metFORMIN (GLUCOPHAGE) 500 MG tablet Take 1 tablet by mouth daily. 10/26/14  Yes Historical Provider, MD  MICROLET LANCETS MISC MICROLET LANCETS (Miscellaneous) - Historical Medication Active   Yes Historical  Provider,  MD  MULTIPLE VITAMINS-MINERALS PO Take by mouth.   Yes Historical Provider, MD  NEXIUM 20 MG capsule Take 1 tablet by mouth daily. 06/18/15  Yes Historical Provider, MD  PATANOL 0.1 % ophthalmic solution Place 1 drop into both eyes daily. 08/28/15  Yes Historical Provider, MD  spironolactone (ALDACTONE) 25 MG tablet Take 1 tablet by mouth daily. 10/26/14  Yes Historical Provider, MD  timolol (TIMOPTIC) 0.5 % ophthalmic solution  04/10/15  Yes Historical Provider, MD    No Known Allergies  Past Surgical History  Procedure Laterality Date  . Vaginal hysterectomy    . Tonsillectomy    . Cataract extraction      Social History  Substance Use Topics  . Smoking status: Never Smoker   . Smokeless tobacco: None  . Alcohol Use: No   Lab Results  Component Value Date   HGBA1C 6.5* 06/23/2015   Lab Results  Component Value Date   WBC 8.1 06/23/2015   HGB 13.2 06/20/2014   HCT 40.3 06/23/2015   MCV 94 06/23/2015   PLT 258 06/23/2015    Medication list has been reviewed and updated.   Physical Exam  Constitutional: She is oriented to person, place, and time. She appears well-developed. No distress.  HENT:  Head: Normocephalic and atraumatic.  Neck: No thyromegaly present.  Cardiovascular: Normal rate, regular rhythm and normal heart sounds.   Pulmonary/Chest: Effort normal and breath sounds normal. No respiratory distress.  Abdominal: Soft. She exhibits no distension. There is no tenderness. There is no rebound.  Musculoskeletal: Normal range of motion.  Neurological: She is alert and oriented to person, place, and time.  Skin: Skin is warm and dry. No rash noted.  Psychiatric: She has a normal mood and affect. Her behavior is normal. Thought content normal.  Nursing note and vitals reviewed.   BP 142/86 mmHg  Pulse 72  Ht 5' 3.5" (1.613 m)  Wt 155 lb 6.4 oz (70.489 kg)  BMI 27.09 kg/m2  Assessment and Plan: 1. Controlled type 2 diabetes mellitus without  complication, without long-term current use of insulin (HCC) Continue current therapy - Hemoglobin A1c  2. Essential (primary) hypertension controlled  3. Anemia, deficiency Continue Fe supplement  4. Constipation, unspecified constipation type Try Colace daily  If needed, could then try Stool softener with vegetable laxative  Halina Maidens, MD [facility]  10/31/2015

## 2015-10-31 NOTE — Patient Instructions (Signed)
Stool softener - Colace or generic Ducosate Sodium 100mg   - take it once a day with big glass of water.  If needed, you could then try  "stool softener with vegetable laxative" once a day.

## 2015-11-01 ENCOUNTER — Telehealth: Payer: Self-pay

## 2015-11-01 LAB — HEMOGLOBIN A1C
Est. average glucose Bld gHb Est-mCnc: 134 mg/dL
Hgb A1c MFr Bld: 6.3 % — ABNORMAL HIGH (ref 4.8–5.6)

## 2015-11-01 NOTE — Telephone Encounter (Signed)
Spoke with patient. Patient advised of all results and verbalized understanding. Will call back with any future questions or concerns. MAH  

## 2015-11-01 NOTE — Telephone Encounter (Signed)
-----   Message from Glean Hess, MD sent at 11/01/2015  9:49 AM EST ----- DM is well controlled.

## 2015-11-27 ENCOUNTER — Other Ambulatory Visit: Payer: Self-pay | Admitting: Internal Medicine

## 2015-11-27 MED ORDER — ENALAPRIL MALEATE 20 MG PO TABS: 20.0000 mg | ORAL_TABLET | Freq: Every day | ORAL | Status: DC

## 2015-11-29 ENCOUNTER — Other Ambulatory Visit: Payer: Self-pay | Admitting: Internal Medicine

## 2015-12-13 ENCOUNTER — Other Ambulatory Visit: Payer: Self-pay | Admitting: Internal Medicine

## 2016-02-06 DIAGNOSIS — L814 Other melanin hyperpigmentation: Secondary | ICD-10-CM | POA: Diagnosis not present

## 2016-02-06 DIAGNOSIS — L821 Other seborrheic keratosis: Secondary | ICD-10-CM | POA: Diagnosis not present

## 2016-02-06 DIAGNOSIS — D1801 Hemangioma of skin and subcutaneous tissue: Secondary | ICD-10-CM | POA: Diagnosis not present

## 2016-02-06 DIAGNOSIS — X32XXXA Exposure to sunlight, initial encounter: Secondary | ICD-10-CM | POA: Diagnosis not present

## 2016-02-27 ENCOUNTER — Encounter: Payer: Self-pay | Admitting: Internal Medicine

## 2016-02-27 ENCOUNTER — Other Ambulatory Visit: Payer: Self-pay | Admitting: Internal Medicine

## 2016-02-27 ENCOUNTER — Ambulatory Visit (INDEPENDENT_AMBULATORY_CARE_PROVIDER_SITE_OTHER): Payer: Medicare Other | Admitting: Internal Medicine

## 2016-02-27 VITALS — BP 110/78 | HR 67 | Resp 16 | Ht 63.5 in | Wt 154.0 lb

## 2016-02-27 DIAGNOSIS — K2270 Barrett's esophagus without dysplasia: Secondary | ICD-10-CM | POA: Diagnosis not present

## 2016-02-27 DIAGNOSIS — E1169 Type 2 diabetes mellitus with other specified complication: Secondary | ICD-10-CM

## 2016-02-27 DIAGNOSIS — Z1231 Encounter for screening mammogram for malignant neoplasm of breast: Secondary | ICD-10-CM | POA: Diagnosis not present

## 2016-02-27 DIAGNOSIS — E119 Type 2 diabetes mellitus without complications: Secondary | ICD-10-CM | POA: Diagnosis not present

## 2016-02-27 DIAGNOSIS — I10 Essential (primary) hypertension: Secondary | ICD-10-CM | POA: Diagnosis not present

## 2016-02-27 DIAGNOSIS — E785 Hyperlipidemia, unspecified: Secondary | ICD-10-CM | POA: Diagnosis not present

## 2016-02-27 MED ORDER — GLUCOSE BLOOD VI STRP: 1.0000 | ORAL_STRIP | Freq: Every day | Status: DC

## 2016-02-27 MED ORDER — MICROLET LANCETS MISC: Status: DC

## 2016-02-27 MED ORDER — FREESTYLE LITE TEST VI STRP: ORAL_STRIP | Status: DC

## 2016-02-27 MED ORDER — PANTOPRAZOLE SODIUM 40 MG PO TBEC: 40.0000 mg | DELAYED_RELEASE_TABLET | Freq: Every day | ORAL | Status: DC

## 2016-02-27 NOTE — Progress Notes (Signed)
Date:  02/27/2016   Name:  Lori Harrell   DOB:  07-07-1941   MRN:  [id number]   Chief Complaint: Diabetes and Hypertension Diabetes She presents for her follow-up diabetic visit. She has type 2 diabetes mellitus. Her disease course has been stable. Pertinent negatives for hypoglycemia include no headaches or tremors. Pertinent negatives for diabetes include no chest pain, no fatigue, no polydipsia and no polyuria. Symptoms are stable. Current diabetic treatment includes oral agent (monotherapy). An ACE inhibitor/angiotensin II receptor blocker is being taken.  Hypertension This is a chronic problem. The current episode started more than 1 year ago. The problem is unchanged. The problem is controlled. Pertinent negatives include no chest pain, headaches, palpitations or shortness of breath.  Hyperlipidemia This is a chronic problem. The problem is controlled. Exacerbating diseases include diabetes. Pertinent negatives include no chest pain, leg pain, myalgias or shortness of breath. Current antihyperlipidemic treatment includes statins. The current treatment provides significant improvement of lipids.  Gastroesophageal Reflux She reports no abdominal pain, no chest pain, no coughing, no heartburn, no sore throat or no wheezing. This is a recurrent problem. The problem occurs rarely. Pertinent negatives include no fatigue. Risk factors include Barrett's esophagus (last EGD in 2014).  Nexium is no longer covered. She can get pantoprazole for zero copay for #90.  Lab Results  Component Value Date   HGBA1C 6.3* 10/31/2015   Lab Results  Component Value Date   CHOL 158 06/23/2015   HDL 43 06/23/2015   LDLCALC 95 06/23/2015   TRIG 102 06/23/2015   CHOLHDL 3.7 06/23/2015     Review of Systems  Constitutional: Negative for fever, appetite change, fatigue and unexpected weight change.  HENT: Negative for sore throat, tinnitus and trouble swallowing.   Eyes: Negative for visual  disturbance.  Respiratory: Negative for cough, chest tightness, shortness of breath and wheezing.   Cardiovascular: Negative for chest pain, palpitations and leg swelling.  Gastrointestinal: Negative for heartburn and abdominal pain.  Endocrine: Negative for polydipsia and polyuria.  Genitourinary: Negative for dysuria and hematuria.  Musculoskeletal: Positive for back pain. Negative for myalgias and arthralgias.  Neurological: Negative for tremors, numbness and headaches.  Psychiatric/Behavioral: Negative for dysphoric mood.    Patient Active Problem List   Diagnosis Date Noted  . Controlled type 2 diabetes mellitus without complication, without long-term current use of insulin ([place]) 10/31/2015  . Constipation 10/31/2015  . Anemia, deficiency 06/16/2015  . 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 Essential (primary) hypertension 06/16/2015  . Hyperlipidemia associated with type 2 diabetes mellitus ([place]) 06/16/2015  . OP (osteoporosis) 06/16/2015    Prior to Admission medications   Medication Sig Start Date End Date Taking? Authorizing Provider  Acetaminophen-Caffeine (EXCEDRIN TENSION HEADACHE) 500-65 MG TABS Take by mouth.    Historical Provider, MD  aspirin 81 MG tablet Take by mouth.    Historical Provider, MD  atorvastatin (LIPITOR) 20 MG tablet TAKE 1 TABLET DAILY 11/29/15   Glean Hess, MD  enalapril (VASOTEC) 20 MG tablet Take 1 tablet (20 mg total) by mouth daily. 11/27/15   Glean Hess, MD  ferrous sulfate (IRON SUPPLEMENT) 325 (65 FE) MG tablet Take 1 tablet by mouth daily.    Historical Provider, MD  glucose blood (BAYER CONTOUR NEXT TEST) test strip BAYER CONTOUR NEXT TEST (In Vitro Strip) - Historical Medication Active    Historical Provider, MD  latanoprost (XALATAN) 0.005 % ophthalmic solution Place 1 drop into both eyes at bedtime. 10/09/15   Historical  Provider, MD  metFORMIN (GLUCOPHAGE) 500 MG tablet TAKE 1 TABLET DAILY 11/29/15   Glean Hess, MD  MICROLET LANCETS  MISC MICROLET LANCETS (Miscellaneous) - Historical Medication Active    Historical Provider, MD  MULTIPLE VITAMINS-MINERALS PO Take by mouth.    Historical Provider, MD  NEXIUM 20 MG capsule TAKE 1 CAPSULE DAILY 12/13/15   Glean Hess, MD  PATANOL 0.1 % ophthalmic solution Place 1 drop into both eyes daily. 08/28/15   Historical Provider, MD  spironolactone (ALDACTONE) 25 MG tablet TAKE 1 TABLET DAILY 11/29/15   Glean Hess, MD  timolol (TIMOPTIC) 0.5 % ophthalmic solution  04/10/15   Historical Provider, MD    No Known Allergies  Past Surgical History  Procedure Laterality Date  . Vaginal hysterectomy    . Tonsillectomy    . Cataract extraction      Social History  Substance Use Topics  . Smoking status: Never Smoker   . Smokeless tobacco: None  . Alcohol Use: No     Medication list has been reviewed and updated.   Physical Exam  Constitutional: She is oriented to person, place, and time. She appears well-developed. No distress.  HENT:  Head: Normocephalic and atraumatic.  Eyes: Pupils are equal, round, and reactive to light.  Neck: Normal range of motion. Carotid bruit is not present. No thyromegaly present.  Cardiovascular: Normal rate, regular rhythm and normal heart sounds.   Pulmonary/Chest: Effort normal and breath sounds normal. No respiratory distress. She has no wheezes. She has no rales.  Abdominal: Soft. There is no tenderness.  Musculoskeletal: She exhibits no edema or tenderness.       Right knee: She exhibits no swelling and no effusion.       Left knee: She exhibits no swelling and no effusion.  Neurological: She is alert and oriented to person, place, and time. She has normal reflexes.  Skin: Skin is warm and dry. No rash noted.  Psychiatric: She has a normal mood and affect. Her behavior is normal. Thought content normal.  Nursing note and vitals reviewed.   BP 110/78 mmHg  Pulse 67  Resp 16  Ht 5' 3.5" (1.613 m)  Wt 154 lb (69.854 kg)  BMI  26.85 kg/m2  SpO2 99%  Assessment and Plan: 1. Controlled type 2 diabetes mellitus without complication, without long-term current use of insulin (HCC) Well controlled without hypoglycemia - Hemoglobin 123456 - Basic metabolic panel - glucose blood (BAYER CONTOUR NEXT TEST) test strip; 1 each by Other route daily. Use to test blood sugar daily  Dispense: 100 each; Refill: 3 - MICROLET LANCETS MISC; Test blood sugar daily  Dispense: 100 each; Refill: 3  2. Essential (primary) hypertension controlled  3. Hyperlipidemia associated with type 2 diabetes mellitus ([place]) On statin therapy  4. Barrett esophagus D/c Nexium; begin Pantoprazole Due for EGD at [facility] - pt will call  - pantoprazole (PROTONIX) 40 MG tablet; Take 1 tablet (40 mg total) by mouth daily.  Dispense: 90 tablet; Refill: 3  5. Encounter for screening mammogram for breast cancer - MM DIGITAL SCREENING BILATERAL; Future   Halina Maidens, MD [facility]  02/27/2016

## 2016-02-27 NOTE — Patient Instructions (Signed)
DASH Eating Plan  DASH stands for "Dietary Approaches to Stop Hypertension." The DASH eating plan is a healthy eating plan that has been shown to reduce high blood pressure (hypertension). Additional health benefits may include reducing the risk of type 2 diabetes mellitus, heart disease, and stroke. The DASH eating plan may also help with weight loss.  WHAT DO I NEED TO KNOW ABOUT THE DASH EATING PLAN?  For the DASH eating plan, you will follow these general guidelines:  · Choose foods with a percent daily value for sodium of less than 5% (as listed on the food label).  · Use salt-free seasonings or herbs instead of table salt or sea salt.  · Check with your health care provider or pharmacist before using salt substitutes.  · Eat lower-sodium products, often labeled as "lower sodium" or "no salt added."  · Eat fresh foods.  · Eat more vegetables, fruits, and low-fat dairy products.  · Choose whole grains. Look for the word "whole" as the first word in the ingredient list.  · Choose fish and skinless chicken or turkey more often than red meat. Limit fish, poultry, and meat to 6 oz (170 g) each day.  · Limit sweets, desserts, sugars, and sugary drinks.  · Choose heart-healthy fats.  · Limit cheese to 1 oz (28 g) per day.  · Eat more home-cooked food and less restaurant, buffet, and fast food.  · Limit fried foods.  · Cook foods using methods other than frying.  · Limit canned vegetables. If you do use them, rinse them well to decrease the sodium.  · When eating at a restaurant, ask that your food be prepared with less salt, or no salt if possible.  WHAT FOODS CAN I EAT?  Seek help from a dietitian for individual calorie needs.  Grains  Whole grain or whole wheat bread. Brown rice. Whole grain or whole wheat pasta. Quinoa, bulgur, and whole grain cereals. Low-sodium cereals. Corn or whole wheat flour tortillas. Whole grain cornbread. Whole grain crackers. Low-sodium crackers.  Vegetables  Fresh or frozen vegetables  (raw, steamed, roasted, or grilled). Low-sodium or reduced-sodium tomato and vegetable juices. Low-sodium or reduced-sodium tomato sauce and paste. Low-sodium or reduced-sodium canned vegetables.   Fruits  All fresh, canned (in natural juice), or frozen fruits.  Meat and Other Protein Products  Ground beef (85% or leaner), grass-fed beef, or beef trimmed of fat. Skinless chicken or turkey. Ground chicken or turkey. Pork trimmed of fat. All fish and seafood. Eggs. Dried beans, peas, or lentils. Unsalted nuts and seeds. Unsalted canned beans.  Dairy  Low-fat dairy products, such as skim or 1% milk, 2% or reduced-fat cheeses, low-fat ricotta or cottage cheese, or plain low-fat yogurt. Low-sodium or reduced-sodium cheeses.  Fats and Oils  Tub margarines without trans fats. Light or reduced-fat mayonnaise and salad dressings (reduced sodium). Avocado. Safflower, olive, or canola oils. Natural peanut or almond butter.  Other  Unsalted popcorn and pretzels.  The items listed above may not be a complete list of recommended foods or beverages. Contact your dietitian for more options.  WHAT FOODS ARE NOT RECOMMENDED?  Grains  White bread. White pasta. White rice. Refined cornbread. Bagels and croissants. Crackers that contain trans fat.  Vegetables  Creamed or fried vegetables. Vegetables in a cheese sauce. Regular canned vegetables. Regular canned tomato sauce and paste. Regular tomato and vegetable juices.  Fruits  Dried fruits. Canned fruit in light or heavy syrup. Fruit juice.  Meat and Other Protein   Products  Fatty cuts of meat. Ribs, chicken wings, bacon, sausage, bologna, salami, chitterlings, fatback, hot dogs, bratwurst, and packaged luncheon meats. Salted nuts and seeds. Canned beans with salt.  Dairy  Whole or 2% milk, cream, half-and-half, and cream cheese. Whole-fat or sweetened yogurt. Full-fat cheeses or blue cheese. Nondairy creamers and whipped toppings. Processed cheese, cheese spreads, or cheese  curds.  Condiments  Onion and garlic salt, seasoned salt, table salt, and sea salt. Canned and packaged gravies. Worcestershire sauce. Tartar sauce. Barbecue sauce. Teriyaki sauce. Soy sauce, including reduced sodium. Steak sauce. Fish sauce. Oyster sauce. Cocktail sauce. Horseradish. Ketchup and mustard. Meat flavorings and tenderizers. Bouillon cubes. Hot sauce. Tabasco sauce. Marinades. Taco seasonings. Relishes.  Fats and Oils  Butter, stick margarine, lard, shortening, ghee, and bacon fat. Coconut, palm kernel, or palm oils. Regular salad dressings.  Other  Pickles and olives. Salted popcorn and pretzels.  The items listed above may not be a complete list of foods and beverages to avoid. Contact your dietitian for more information.  WHERE CAN I FIND MORE INFORMATION?  National Heart, Lung, and [employer] Institute: www.nhlbi.nih.gov/health/health-topics/topics/dash/     This information is not intended to replace advice given to you by your health care provider. Make sure you discuss any questions you have with your health care provider.     Document Released: 09/05/2011 Document Revised: 10/07/2014 Document Reviewed: 07/21/2013  Elsevier Interactive Patient Education ©2016 [employer] Inc.

## 2016-02-28 LAB — BASIC METABOLIC PANEL
BUN/Creatinine Ratio: 14 (ref 12–28)
BUN: 11 mg/dL (ref 8–27)
CO2: 23 mmol/L (ref 18–29)
CREATININE: 0.8 mg/dL (ref 0.57–1.00)
Calcium: 9.8 mg/dL (ref 8.7–10.3)
Chloride: 98 mmol/L (ref 96–106)
GFR, EST AFRICAN AMERICAN: 83 mL/min/{1.73_m2} (ref >59)
GFR, EST NON AFRICAN AMERICAN: 72 mL/min/{1.73_m2} (ref >59)
Glucose: 84 mg/dL (ref 65–99)
POTASSIUM: 4.7 mmol/L (ref 3.5–5.2)
SODIUM: 140 mmol/L (ref 134–144)

## 2016-02-28 LAB — HEMOGLOBIN A1C
Est. average glucose Bld gHb Est-mCnc: 137 mg/dL
HEMOGLOBIN A1C: 6.4 % — AB (ref 4.8–5.6)

## 2016-03-26 DIAGNOSIS — E119 Type 2 diabetes mellitus without complications: Secondary | ICD-10-CM | POA: Diagnosis not present

## 2016-03-26 DIAGNOSIS — H338 Other retinal detachments: Secondary | ICD-10-CM | POA: Diagnosis not present

## 2016-03-26 DIAGNOSIS — Z961 Presence of intraocular lens: Secondary | ICD-10-CM | POA: Diagnosis not present

## 2016-03-26 DIAGNOSIS — H401121 Primary open-angle glaucoma, left eye, mild stage: Secondary | ICD-10-CM | POA: Diagnosis not present

## 2016-03-26 DIAGNOSIS — H401113 Primary open-angle glaucoma, right eye, severe stage: Secondary | ICD-10-CM | POA: Diagnosis not present

## 2016-03-27 LAB — HM DIABETES EYE EXAM

## 2016-04-22 DIAGNOSIS — I10 Essential (primary) hypertension: Secondary | ICD-10-CM | POA: Diagnosis not present

## 2016-04-22 DIAGNOSIS — E119 Type 2 diabetes mellitus without complications: Secondary | ICD-10-CM | POA: Diagnosis not present

## 2016-04-22 DIAGNOSIS — K219 Gastro-esophageal reflux disease without esophagitis: Secondary | ICD-10-CM | POA: Diagnosis not present

## 2016-04-22 DIAGNOSIS — H409 Unspecified glaucoma: Secondary | ICD-10-CM | POA: Diagnosis not present

## 2016-04-22 DIAGNOSIS — K449 Diaphragmatic hernia without obstruction or gangrene: Secondary | ICD-10-CM | POA: Diagnosis not present

## 2016-04-22 DIAGNOSIS — Z79899 Other long term (current) drug therapy: Secondary | ICD-10-CM | POA: Diagnosis not present

## 2016-04-22 DIAGNOSIS — M8580 Other specified disorders of bone density and structure, unspecified site: Secondary | ICD-10-CM | POA: Diagnosis not present

## 2016-04-22 DIAGNOSIS — K22719 Barrett's esophagus with dysplasia, unspecified: Secondary | ICD-10-CM | POA: Diagnosis not present

## 2016-04-22 DIAGNOSIS — K2270 Barrett's esophagus without dysplasia: Secondary | ICD-10-CM | POA: Diagnosis not present

## 2016-04-22 DIAGNOSIS — E7800 Pure hypercholesterolemia, unspecified: Secondary | ICD-10-CM | POA: Diagnosis not present

## 2016-04-22 HISTORY — PX: ESOPHAGOGASTRODUODENOSCOPY: SHX1529

## 2016-05-31 ENCOUNTER — Other Ambulatory Visit: Payer: Self-pay | Admitting: Internal Medicine

## 2016-05-31 DIAGNOSIS — E119 Type 2 diabetes mellitus without complications: Secondary | ICD-10-CM

## 2016-06-24 ENCOUNTER — Encounter: Payer: Self-pay | Admitting: Internal Medicine

## 2016-07-04 ENCOUNTER — Ambulatory Visit (INDEPENDENT_AMBULATORY_CARE_PROVIDER_SITE_OTHER): Payer: Medicare Other | Admitting: Internal Medicine

## 2016-07-04 ENCOUNTER — Encounter: Payer: Self-pay | Admitting: Internal Medicine

## 2016-07-04 VITALS — BP 123/78 | HR 64 | Resp 16 | Ht 63.5 in | Wt 147.0 lb

## 2016-07-04 DIAGNOSIS — E1169 Type 2 diabetes mellitus with other specified complication: Secondary | ICD-10-CM

## 2016-07-04 DIAGNOSIS — Z23 Encounter for immunization: Secondary | ICD-10-CM

## 2016-07-04 DIAGNOSIS — E785 Hyperlipidemia, unspecified: Secondary | ICD-10-CM | POA: Diagnosis not present

## 2016-07-04 DIAGNOSIS — Z0000 Encounter for general adult medical examination without abnormal findings: Secondary | ICD-10-CM

## 2016-07-04 DIAGNOSIS — K2270 Barrett's esophagus without dysplasia: Secondary | ICD-10-CM

## 2016-07-04 DIAGNOSIS — I10 Essential (primary) hypertension: Secondary | ICD-10-CM

## 2016-07-04 DIAGNOSIS — N393 Stress incontinence (female) (male): Secondary | ICD-10-CM | POA: Insufficient documentation

## 2016-07-04 DIAGNOSIS — K5732 Diverticulitis of large intestine without perforation or abscess without bleeding: Secondary | ICD-10-CM | POA: Insufficient documentation

## 2016-07-04 DIAGNOSIS — K449 Diaphragmatic hernia without obstruction or gangrene: Secondary | ICD-10-CM | POA: Insufficient documentation

## 2016-07-04 DIAGNOSIS — M8580 Other specified disorders of bone density and structure, unspecified site: Secondary | ICD-10-CM | POA: Insufficient documentation

## 2016-07-04 DIAGNOSIS — E119 Type 2 diabetes mellitus without complications: Secondary | ICD-10-CM

## 2016-07-04 MED ORDER — METFORMIN HCL 500 MG PO TABS: 500.0000 mg | ORAL_TABLET | Freq: Every day | ORAL | 3 refills | Status: DC

## 2016-07-04 MED ORDER — SPIRONOLACTONE 25 MG PO TABS: 25.0000 mg | ORAL_TABLET | Freq: Every day | ORAL | 3 refills | Status: DC

## 2016-07-04 MED ORDER — ATORVASTATIN CALCIUM 20 MG PO TABS: 20.0000 mg | ORAL_TABLET | Freq: Every day | ORAL | 3 refills | Status: DC

## 2016-07-04 NOTE — Patient Instructions (Signed)
Health Maintenance  Topic Date Due  . MAMMOGRAM  10/30/2016  . HEMOGLOBIN A1C  01/02/2017  . OPHTHALMOLOGY EXAM  03/27/2017  . FOOT EXAM  07/04/2017  . TETANUS/TDAP  10/01/2020  . INFLUENZA VACCINE  Completed  . DEXA SCAN  Addressed  . ZOSTAVAX  Addressed  . PNA vac Low Risk Adult  Completed

## 2016-07-04 NOTE — Progress Notes (Signed)
Patient: Lori Harrell, Female    DOB: September 20, 1941, 75 y.o.   MRN: [id number] Visit Date: 07/04/2016  Today's Provider: Halina Maidens, MD   Chief Complaint  Patient presents with  . Medicare Wellness   Subjective:    Annual wellness visit Lori Harrell is a 75 y.o. female who presents today for her Subsequent Annual Wellness Visit. She feels well. She reports exercising regularly. She reports she is sleeping fairly well. She denies breast problems - mammogram is already scheduled.  ----------------------------------------------------------- Diabetes  She presents for her follow-up diabetic visit. She has type 2 diabetes mellitus. Her disease course has been stable. Pertinent negatives for hypoglycemia include no dizziness, headaches, nervousness/anxiousness or tremors. Pertinent negatives for diabetes include no chest pain, no fatigue, no polydipsia and no polyuria. Symptoms are stable. She is following a generally healthy diet. Her breakfast blood glucose is taken between 7-8 am. Her breakfast blood glucose range is generally 110-130 mg/dl. An ACE inhibitor/angiotensin II receptor blocker is being taken.  Hypertension  This is a chronic problem. The current episode started more than 1 year ago. The problem is unchanged. The problem is controlled. Pertinent negatives include no chest pain, headaches, palpitations or shortness of breath. Past treatments include ACE inhibitors and diuretics. The current treatment provides significant improvement. There are no compliance problems.   Hyperlipidemia  This is a chronic problem. The problem is controlled. Recent lipid tests were reviewed and are normal. Pertinent negatives include no chest pain or shortness of breath. Current antihyperlipidemic treatment includes statins. The current treatment provides significant improvement of lipids.  Gastroesophageal Reflux  She reports no abdominal pain, no chest pain, no coughing, no heartburn,  no hoarse voice or no wheezing. This is a chronic problem. Pertinent negatives include no fatigue. Risk factors include Barrett's esophagus. Past procedures include an EGD.    Review of Systems  Constitutional: Negative for chills, fatigue and fever.  HENT: Negative for congestion, hearing loss, hoarse voice, tinnitus, trouble swallowing and voice change.   Eyes: Negative for visual disturbance.  Respiratory: Negative for cough, chest tightness, shortness of breath and wheezing.   Cardiovascular: Negative for chest pain, palpitations and leg swelling.  Gastrointestinal: Negative for abdominal pain, constipation, diarrhea, heartburn and vomiting.  Endocrine: Negative for polydipsia and polyuria.  Genitourinary: Negative for dysuria, frequency, genital sores, vaginal bleeding and vaginal discharge.  Musculoskeletal: Positive for arthralgias and back pain. Negative for gait problem and joint swelling.  Skin: Negative for color change and rash.  Neurological: Negative for dizziness, tremors, light-headedness and headaches.  Hematological: Negative for adenopathy. Does not bruise/bleed easily.  Psychiatric/Behavioral: Negative for dysphoric mood and sleep disturbance. The patient is not nervous/anxious.     Social History   Social History  . Marital status: Widowed    Spouse name: N/A  . Number of children: N/A  . Years of education: N/A   Occupational History  . Not on file.   Social History Main Topics  . Smoking status: Never Smoker  . Smokeless tobacco: Never Used  . Alcohol use No  . Drug use: No  . Sexual activity: Not on file   Other Topics Concern  . Not on file   Social History Narrative  . No narrative on file    Patient Active Problem List   Diagnosis Date Noted  . Large hiatal hernia 07/04/2016  . Osteopenia 07/04/2016  . Sigmoid diverticulitis 07/04/2016  . Stress incontinence 07/04/2016  . Controlled type 2 diabetes mellitus without complication, without  long-term current use of insulin ([place]) 10/31/2015  . Constipation 10/31/2015  . Anemia, deficiency 06/16/2015  . 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 Essential (primary) hypertension 06/16/2015  . Hyperlipidemia associated with type 2 diabetes mellitus ([place]) 06/16/2015  . OP (osteoporosis) 06/16/2015  . UGIB (upper gastrointestinal bleed) 01/11/2013  . Volume depletion 01/11/2013  . Hypercalcemia 04/22/2012    Past Surgical History:  Procedure Laterality Date  . CATARACT EXTRACTION    . TONSILLECTOMY    . UPPER GASTROINTESTINAL ENDOSCOPY  02/2013   Barrett's - done at [place]      Her family history includes CAD in her father; Diabetes in her father.    Previous Medications   ACETAMINOPHEN-CAFFEINE (EXCEDRIN TENSION HEADACHE) 500-65 MG TABS    Take by mouth.   ASPIRIN 81 MG TABLET    Take by mouth.   ATORVASTATIN (LIPITOR) 20 MG TABLET    TAKE 1 TABLET DAILY   ENALAPRIL (VASOTEC) 20 MG TABLET    Take 1 tablet (20 mg total) by mouth daily.   FERROUS SULFATE (IRON SUPPLEMENT) 325 (65 FE) MG TABLET    Take 1 tablet by mouth daily.   FREESTYLE LITE TEST STRIP    Use to test blood sugar daily   LANCETS (FREESTYLE) LANCETS    USE 1 LANCET DAILY   LATANOPROST (XALATAN) 0.005 % OPHTHALMIC SOLUTION    Place 1 drop into both eyes at bedtime.   METFORMIN (GLUCOPHAGE) 500 MG TABLET    TAKE 1 TABLET DAILY   MULTIPLE VITAMINS-MINERALS PO    Take by mouth.   PANTOPRAZOLE (PROTONIX) 40 MG TABLET    Take 1 tablet (40 mg total) by mouth daily.   SPIRONOLACTONE (ALDACTONE) 25 MG TABLET    TAKE 1 TABLET DAILY   TIMOLOL (TIMOPTIC) 0.5 % OPHTHALMIC SOLUTION        Patient Care Team: Glean Hess, MD as PCP - General (Internal Medicine) Milus Height, MD (Ophthalmology) Johnnette Litter, MD as Referring Physician (Gastroenterology)     Objective:   Vitals: BP 123/78   Pulse 64   Resp 16   Ht 5' 3.5" (1.613 m)   Wt 147 lb (66.7 kg)   SpO2 97%   BMI 25.63  kg/m   Physical Exam  Constitutional: She is oriented to person, place, and time. She appears well-developed and well-nourished. No distress.  HENT:  Head: Normocephalic and atraumatic.  Right Ear: Tympanic membrane and ear canal normal.  Left Ear: Tympanic membrane and ear canal normal.  Nose: Right sinus exhibits no maxillary sinus tenderness. Left sinus exhibits no maxillary sinus tenderness.  Mouth/Throat: Uvula is midline and oropharynx is clear and moist.  Eyes: Conjunctivae and EOM are normal. Right eye exhibits no discharge. Left eye exhibits no discharge. No scleral icterus.  Neck: Normal range of motion. Neck supple. Carotid bruit is not present. No erythema present. No thyromegaly present.  Cardiovascular: Normal rate, regular rhythm, normal heart sounds and normal pulses.   Pulmonary/Chest: Effort normal and breath sounds normal. No respiratory distress. She has no wheezes. Right breast exhibits no mass, no nipple discharge, no skin change and no tenderness. Left breast exhibits no mass, no nipple discharge, no skin change and no tenderness.  Abdominal: Soft. Bowel sounds are normal. There is no hepatosplenomegaly. There is no tenderness. There is no CVA tenderness.  Musculoskeletal:       Lumbar back: She exhibits decreased range of motion. She exhibits no deformity and no spasm.  Lymphadenopathy:    She has no cervical adenopathy.    She has no axillary adenopathy.  Neurological: She is alert and oriented to person, place, and time. No cranial nerve deficit or sensory deficit.  Skin: Skin is warm, dry and intact. No rash noted.  Psychiatric: She has a normal mood and affect. Her speech is normal and behavior is normal. Thought content normal. Cognition and memory are normal.  Nursing note and vitals reviewed.   Activities of Daily Living In your present state of health, do you have any difficulty performing the following activities: 07/04/2016 02/27/2016  Hearing? N N    Vision? N N  Difficulty concentrating or making decisions? N N  Walking or climbing stairs? N N  Dressing or bathing? N N  Doing errands, shopping? N N  Preparing Food and eating ? N -  Using the Toilet? N -  In the past six months, have you accidently leaked urine? N -  Do you have problems with loss of bowel control? N -  Managing your Medications? N -  Managing your Finances? N -  Housekeeping or managing your Housekeeping? N -  Some recent data might be hidden    Fall Risk Assessment Fall Risk  07/04/2016 02/27/2016 06/23/2015  Falls in the past year? No No No      Depression Screen PHQ 2/9 Scores 07/04/2016 02/27/2016 06/23/2015  PHQ - 2 Score 0 0 0    Cognitive Testing - 6-CIT   Correct? Score   What year is it? yes 0 Yes = 0    No = 4  What month is it? yes 0 Yes = 0    No = 3  Remember:     [address]ia [address], [place], [place]     What time is it? yes 0 Yes = 0    No = 3  Count backwards from 20 to 1 yes 0 Correct = 0    1 error = 2   More than 1 error = 4  Say the months of the year in reverse. yes 0 Correct = 0    1 error = 2   More than 1 error = 4  What address did I ask you to remember? yes 0 Correct = 0  1 error = 2    2 error = 4    3 error = 6    4 error = 8    All wrong = 10       TOTAL SCORE  0/28   Interpretation:  Normal  Normal (0-7) Abnormal (8-28)        Medicare Annual Wellness Visit Summary:  Reviewed patient's Family Medical History Reviewed and updated list of patient's medical providers Assessment of cognitive impairment was done Assessed patient's functional ability Established a written schedule for health screening [place] Completed and Reviewed  Exercise Activities and Dietary recommendations Goals    . continue activity          Taking a class in [place] in [place] with church. She also walks 2-3 days a week 30 min and helps at church every Wed night.       Immunization History  Administered Date(s)  Administered  . Influenza,inj,Quad PF,36+ Mos 06/23/2015  . Pneumococcal Conjugate-13 06/20/2014  . Pneumococcal Polysaccharide-23 10/02/2007  . Tdap 10/01/2010    Health Maintenance  Topic Date Due  . OPHTHALMOLOGY EXAM  08/14/2016  . HEMOGLOBIN A1C  08/29/2016  . MAMMOGRAM  10/30/2016  . FOOT EXAM  07/04/2017  . TETANUS/TDAP  10/01/2020  . INFLUENZA VACCINE  Completed  . DEXA SCAN  Addressed  . ZOSTAVAX  Addressed  . PNA vac Low Risk Adult  Completed     Discussed health benefits of physical activity, and encouraged her to engage in regular exercise appropriate for her age and condition.    ------------------------------------------------------------------------------------------------------------   Assessment & Plan:  1. Medicare annual wellness visit, subsequent Measures satisfied Mammogram scheduled  2. Need for influenza vaccination - Flu Vaccine QUAD 36+ mos PF IM (Fluarix & Fluzone Quad PF)  3. Essential (primary) hypertension controlled - spironolactone (ALDACTONE) 25 MG tablet; Take 1 tablet (25 mg total) by mouth daily.  Dispense: 90 tablet; Refill: 3 - Comprehensive metabolic panel  4. Barrett's esophagus without dysplasia Recent EGD Barrett's without dysplasia Continue PPI - CBC with Differential/Platelet  5. Controlled type 2 diabetes mellitus without complication, without long-term current use of insulin (HCC) Controlled Recent eye exam - no retinopathy - metFORMIN (GLUCOPHAGE) 500 MG tablet; Take 1 tablet (500 mg total) by mouth daily.  Dispense: 90 tablet; Refill: 3 - Hemoglobin A1c - TSH  6. Hyperlipidemia associated with type 2 diabetes mellitus ([place]) On statin therapy - atorvastatin (LIPITOR) 20 MG tablet; Take 1 tablet (20 mg total) by mouth daily.  Dispense: 90 tablet; Refill: 3 - Lipid panel  7. Hypercalcemia Continue to monitor for worsening - Comprehensive metabolic panel   Halina Maidens, MD [facility]  07/04/2016

## 2016-07-04 NOTE — Addendum Note (Signed)
Addended by: Theresia Majors A on: 07/04/2016 02:50 PM   Modules accepted: Orders

## 2016-07-05 LAB — CBC WITH DIFFERENTIAL/PLATELET
BASOS: 1 %
Basophils Absolute: 0.1 10*3/uL (ref 0.0–0.2)
EOS (ABSOLUTE): 0.1 10*3/uL (ref 0.0–0.4)
Eos: 1 %
HEMOGLOBIN: 12.7 g/dL (ref 11.1–15.9)
Hematocrit: 38 % (ref 34.0–46.6)
IMMATURE GRANS (ABS): 0 10*3/uL (ref 0.0–0.1)
IMMATURE GRANULOCYTES: 0 %
LYMPHS: 20 %
Lymphocytes Absolute: 1.2 10*3/uL (ref 0.7–3.1)
MCH: 31.4 pg (ref 26.6–33.0)
MCHC: 33.4 g/dL (ref 31.5–35.7)
MCV: 94 fL (ref 79–97)
MONOCYTES: 7 %
Monocytes Absolute: 0.4 10*3/uL (ref 0.1–0.9)
NEUTROS PCT: 71 %
Neutrophils Absolute: 4.5 10*3/uL (ref 1.4–7.0)
PLATELETS: 261 10*3/uL (ref 150–379)
RBC: 4.04 x10E6/uL (ref 3.77–5.28)
RDW: 13.3 % (ref 12.3–15.4)
WBC: 6.2 10*3/uL (ref 3.4–10.8)

## 2016-07-05 LAB — COMPREHENSIVE METABOLIC PANEL
A/G RATIO: 1.9 (ref 1.2–2.2)
ALT: 18 IU/L (ref 0–32)
AST: 23 IU/L (ref 0–40)
Albumin: 4.6 g/dL (ref 3.5–4.8)
Alkaline Phosphatase: 45 IU/L (ref 39–117)
BUN/Creatinine Ratio: 13 (ref 12–28)
BUN: 13 mg/dL (ref 8–27)
Bilirubin Total: 0.4 mg/dL (ref 0.0–1.2)
CALCIUM: 10.5 mg/dL — AB (ref 8.7–10.3)
CO2: 21 mmol/L (ref 18–29)
Chloride: 102 mmol/L (ref 96–106)
Creatinine, Ser: 0.99 mg/dL (ref 0.57–1.00)
GFR, EST AFRICAN AMERICAN: 64 mL/min/{1.73_m2} (ref >59)
GFR, EST NON AFRICAN AMERICAN: 56 mL/min/{1.73_m2} — AB (ref >59)
GLUCOSE: 114 mg/dL — AB (ref 65–99)
Globulin, Total: 2.4 g/dL (ref 1.5–4.5)
Potassium: 5.1 mmol/L (ref 3.5–5.2)
Sodium: 140 mmol/L (ref 134–144)
TOTAL PROTEIN: 7 g/dL (ref 6.0–8.5)

## 2016-07-05 LAB — LIPID PANEL
CHOLESTEROL TOTAL: 160 mg/dL (ref 100–199)
Chol/HDL Ratio: 3.6 ratio units (ref 0.0–4.4)
HDL: 45 mg/dL (ref >39)
LDL CALC: 97 mg/dL (ref 0–99)
TRIGLYCERIDES: 88 mg/dL (ref 0–149)
VLDL Cholesterol Cal: 18 mg/dL (ref 5–40)

## 2016-07-05 LAB — HEMOGLOBIN A1C
ESTIMATED AVERAGE GLUCOSE: 128 mg/dL
HEMOGLOBIN A1C: 6.1 % — AB (ref 4.8–5.6)

## 2016-07-05 LAB — TSH: TSH: 4.6 u[IU]/mL — ABNORMAL HIGH (ref 0.450–4.500)

## 2016-07-05 LAB — MICROALBUMIN / CREATININE URINE RATIO
Creatinine, Urine: 66 mg/dL
Microalbumin, Urine: <3 ug/mL

## 2016-09-17 DIAGNOSIS — H401113 Primary open-angle glaucoma, right eye, severe stage: Secondary | ICD-10-CM | POA: Diagnosis not present

## 2016-09-17 DIAGNOSIS — E119 Type 2 diabetes mellitus without complications: Secondary | ICD-10-CM | POA: Diagnosis not present

## 2016-09-17 DIAGNOSIS — H401121 Primary open-angle glaucoma, left eye, mild stage: Secondary | ICD-10-CM | POA: Diagnosis not present

## 2016-09-17 DIAGNOSIS — H338 Other retinal detachments: Secondary | ICD-10-CM | POA: Diagnosis not present

## 2016-09-17 DIAGNOSIS — Z961 Presence of intraocular lens: Secondary | ICD-10-CM | POA: Diagnosis not present

## 2016-10-31 ENCOUNTER — Encounter: Payer: Self-pay | Admitting: Internal Medicine

## 2016-10-31 ENCOUNTER — Ambulatory Visit
Admission: RE | Admit: 2016-10-31 | Discharge: 2016-10-31 | Disposition: A | Discharge status: Home / Self Care | Payer: Medicare Other | Source: Ambulatory Visit | Attending: Internal Medicine | Admitting: Internal Medicine

## 2016-10-31 ENCOUNTER — Ambulatory Visit (INDEPENDENT_AMBULATORY_CARE_PROVIDER_SITE_OTHER): Payer: Medicare Other | Admitting: Internal Medicine

## 2016-10-31 VITALS — BP 126/82 | HR 57 | Temp 97.6°F | Ht 63.5 in | Wt 145.0 lb

## 2016-10-31 DIAGNOSIS — Z1231 Encounter for screening mammogram for malignant neoplasm of breast: Secondary | ICD-10-CM

## 2016-10-31 DIAGNOSIS — N289 Disorder of kidney and ureter, unspecified: Secondary | ICD-10-CM

## 2016-10-31 DIAGNOSIS — E119 Type 2 diabetes mellitus without complications: Secondary | ICD-10-CM | POA: Diagnosis not present

## 2016-10-31 DIAGNOSIS — I10 Essential (primary) hypertension: Secondary | ICD-10-CM

## 2016-10-31 NOTE — Progress Notes (Signed)
Date:  10/31/2016   Name:  Lori Harrell   DOB:  [birth year]   MRN:  [id number]   Chief Complaint: Diabetes and Hypertension Diabetes  She presents for her follow-up diabetic visit. She has type 2 diabetes mellitus. Her disease course has been stable. Pertinent negatives for hypoglycemia include no headaches or tremors. Pertinent negatives for diabetes include no chest pain, no fatigue, no polydipsia and no polyuria. Symptoms are stable. Diabetic complications include nephropathy (mild increase in Cr last visit). Current diabetic treatment includes oral agent (monotherapy). She is compliant with treatment all of the time. She monitors blood glucose at home 1-2 x per day. Her breakfast blood glucose is taken between 7-8 am. Her breakfast blood glucose range is generally 110-130 mg/dl. An ACE inhibitor/angiotensin II receptor blocker is being taken.  Hypertension  This is a chronic problem. The problem is controlled. Pertinent negatives include no chest pain, headaches, palpitations or shortness of breath.    Lab Results  Component Value Date   HGBA1C 6.1 (H) 07/04/2016   Lab Results  Component Value Date   CREATININE 0.99 07/04/2016     Review of Systems  Constitutional: Negative for appetite change, fatigue, fever and unexpected weight change.  HENT: Negative for tinnitus and trouble swallowing.   Eyes: Negative for visual disturbance.  Respiratory: Negative for cough, chest tightness and shortness of breath.   Cardiovascular: Negative for chest pain, palpitations and leg swelling.  Gastrointestinal: Positive for constipation. Negative for abdominal pain, nausea and vomiting.  Endocrine: Negative for polydipsia and polyuria.  Genitourinary: Negative for dysuria and hematuria.  Musculoskeletal: Positive for back pain. Negative for arthralgias.  Skin: Negative for color change and rash.  Neurological: Negative for tremors, numbness and headaches.  Psychiatric/Behavioral:  Negative for dysphoric mood.    Patient Active Problem List   Diagnosis Date Noted  . Large hiatal hernia 07/04/2016  . Sigmoid diverticulitis 07/04/2016  . Stress incontinence 07/04/2016  . Controlled type 2 diabetes mellitus without complication, without long-term current use of insulin ([place]) 10/31/2015  . Constipation 10/31/2015  . 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 Essential (primary) hypertension 06/16/2015  . Hyperlipidemia associated with type 2 diabetes mellitus ([place]) 06/16/2015  . OP (osteoporosis) 06/16/2015  . Volume depletion 01/11/2013  . Hypercalcemia 04/22/2012    Prior to Admission medications   Medication Sig Start Date End Date Taking? Authorizing Provider  Acetaminophen-Caffeine (EXCEDRIN TENSION HEADACHE) 500-65 MG TABS Take by mouth.    Historical Provider, MD  aspirin 81 MG tablet Take by mouth.    Historical Provider, MD  atorvastatin (LIPITOR) 20 MG tablet Take 1 tablet (20 mg total) by mouth daily. 07/04/16   Glean Hess, MD  enalapril (VASOTEC) 20 MG tablet Take 1 tablet (20 mg total) by mouth daily. 11/27/15   Glean Hess, MD  ferrous sulfate (IRON SUPPLEMENT) 325 (65 FE) MG tablet Take 1 tablet by mouth daily.    Historical Provider, MD  FREESTYLE LITE test strip Use to test blood sugar daily 02/27/16   Glean Hess, MD  Lancets (FREESTYLE) lancets USE 1 LANCET DAILY 05/31/16   Glean Hess, MD  latanoprost (XALATAN) 0.005 % ophthalmic solution Place 1 drop into both eyes at bedtime. 10/09/15   Historical Provider, MD  metFORMIN (GLUCOPHAGE) 500 MG tablet Take 1 tablet (500 mg total) by mouth daily. 07/04/16   Glean Hess, MD  MULTIPLE VITAMINS-MINERALS PO Take by mouth.    Historical Provider, MD  pantoprazole (PROTONIX) 40 MG  tablet Take 1 tablet (40 mg total) by mouth daily. 02/27/16   Glean Hess, MD  spironolactone (ALDACTONE) 25 MG tablet Take 1 tablet (25 mg total) by mouth daily. 07/04/16   Glean Hess, MD  timolol (TIMOPTIC) 0.5  % ophthalmic solution  04/10/15   Historical Provider, MD    No Known Allergies  Past Surgical History:  Procedure Laterality Date  . CATARACT EXTRACTION    . TONSILLECTOMY    . UPPER GASTROINTESTINAL ENDOSCOPY  02/2013   Barrett's - done at [place] History  Substance Use Topics  . Smoking status: Never Smoker  . Smokeless tobacco: Never Used  . Alcohol use No     Medication list has been reviewed and updated.   Physical Exam  Constitutional: She is oriented to person, place, and time. She appears well-developed. No distress.  HENT:  Head: Normocephalic and atraumatic.  Neck: Normal range of motion. No thyromegaly present.  Cardiovascular: Normal rate, regular rhythm and normal heart sounds.   Pulmonary/Chest: Effort normal and breath sounds normal. No respiratory distress. She has no wheezes.  Abdominal: Soft. Bowel sounds are normal.  Musculoskeletal: She exhibits no edema or tenderness.  Neurological: She is alert and oriented to person, place, and time.  Skin: Skin is warm and dry. No rash noted.  Psychiatric: She has a normal mood and affect. Her behavior is normal. Thought content normal.  Nursing note and vitals reviewed.   BP 126/82   Pulse (!) 57   Temp 97.6 F (36.4 C)   Ht 5' 3.5" (1.613 m)   Wt 145 lb (65.8 kg)   SpO2 99%   BMI 25.28 kg/m   Assessment and Plan: 1. Essential (primary) hypertension controlled  2. Controlled type 2 diabetes mellitus without complication, without long-term current use of insulin (HCC) stable - Hemoglobin A1c  3. Renal insufficiency Check labs - Basic metabolic panel   Halina Maidens, MD [facility]  10/31/2016

## 2016-11-01 LAB — BASIC METABOLIC PANEL
BUN / CREAT RATIO: 15 (ref 12–28)
BUN: 14 mg/dL (ref 8–27)
CHLORIDE: 99 mmol/L (ref 96–106)
CO2: 23 mmol/L (ref 18–29)
Calcium: 10.1 mg/dL (ref 8.7–10.3)
Creatinine, Ser: 0.93 mg/dL (ref 0.57–1.00)
GFR calc non Af Amer: 60 mL/min/{1.73_m2} (ref >59)
GFR, EST AFRICAN AMERICAN: 69 mL/min/{1.73_m2} (ref >59)
Glucose: 92 mg/dL (ref 65–99)
POTASSIUM: 5.2 mmol/L (ref 3.5–5.2)
SODIUM: 139 mmol/L (ref 134–144)

## 2016-11-01 LAB — HEMOGLOBIN A1C
Est. average glucose Bld gHb Est-mCnc: 123 mg/dL
HEMOGLOBIN A1C: 5.9 % — AB (ref 4.8–5.6)

## 2017-02-04 DIAGNOSIS — D2271 Melanocytic nevi of right lower limb, including hip: Secondary | ICD-10-CM | POA: Diagnosis not present

## 2017-02-04 DIAGNOSIS — D485 Neoplasm of uncertain behavior of skin: Secondary | ICD-10-CM | POA: Diagnosis not present

## 2017-02-04 DIAGNOSIS — D2272 Melanocytic nevi of left lower limb, including hip: Secondary | ICD-10-CM | POA: Diagnosis not present

## 2017-02-04 DIAGNOSIS — D225 Melanocytic nevi of trunk: Secondary | ICD-10-CM | POA: Diagnosis not present

## 2017-02-04 DIAGNOSIS — L821 Other seborrheic keratosis: Secondary | ICD-10-CM | POA: Diagnosis not present

## 2017-02-04 DIAGNOSIS — D2261 Melanocytic nevi of right upper limb, including shoulder: Secondary | ICD-10-CM | POA: Diagnosis not present

## 2017-02-07 ENCOUNTER — Other Ambulatory Visit: Payer: Self-pay | Admitting: Internal Medicine

## 2017-02-07 DIAGNOSIS — K2270 Barrett's esophagus without dysplasia: Secondary | ICD-10-CM

## 2017-02-09 ENCOUNTER — Other Ambulatory Visit: Payer: Self-pay | Admitting: Internal Medicine

## 2017-02-26 ENCOUNTER — Emergency Department
Admission: EM | Admit: 2017-02-26 | Discharge: 2017-02-26 | Disposition: A | Discharge status: Home / Self Care | Payer: Medicare Other | Attending: Emergency Medicine | Admitting: Emergency Medicine

## 2017-02-26 DIAGNOSIS — M47818 Spondylosis without myelopathy or radiculopathy, sacral and sacrococcygeal region: Secondary | ICD-10-CM

## 2017-02-26 DIAGNOSIS — Z79899 Other long term (current) drug therapy: Secondary | ICD-10-CM | POA: Insufficient documentation

## 2017-02-26 DIAGNOSIS — I10 Essential (primary) hypertension: Secondary | ICD-10-CM | POA: Diagnosis not present

## 2017-02-26 DIAGNOSIS — M533 Sacrococcygeal disorders, not elsewhere classified: Secondary | ICD-10-CM | POA: Diagnosis not present

## 2017-02-26 DIAGNOSIS — E119 Type 2 diabetes mellitus without complications: Secondary | ICD-10-CM | POA: Diagnosis not present

## 2017-02-26 DIAGNOSIS — Z7984 Long term (current) use of oral hypoglycemic drugs: Secondary | ICD-10-CM | POA: Diagnosis not present

## 2017-02-26 DIAGNOSIS — M545 Low back pain: Secondary | ICD-10-CM | POA: Diagnosis present

## 2017-02-26 HISTORY — DX: Essential (primary) hypertension: I10

## 2017-02-26 HISTORY — DX: Type 2 diabetes mellitus without complications: E11.9

## 2017-02-26 HISTORY — DX: Gastro-esophageal reflux disease without esophagitis: K21.9

## 2017-02-26 MED ORDER — PREDNISONE 10 MG (21) PO TBPK: ORAL_TABLET | Freq: Every day | ORAL | 0 refills | Status: AC

## 2017-02-26 MED ORDER — METHYLPREDNISOLONE SODIUM SUCC 125 MG IJ SOLR
125.0000 mg | Freq: Once | INTRAMUSCULAR | Status: AC
  Administered 2017-02-26: 125 mg via INTRAMUSCULAR
  Filled 2017-02-26: qty 2

## 2017-02-26 NOTE — ED Triage Notes (Signed)
Pt c/o lower back pain with movement and ambulation. Pt able to ambulate.

## 2017-02-27 NOTE — ED Provider Notes (Signed)
[facility] Emergency Department Provider Note  ____________________________________________  Time seen: Approximately 12:47 AM  I have reviewed the triage vital signs and the nursing notes.   HISTORY  Chief Complaint Back Pain    HPI Lori Harrell is a 76 y.o. female presenting to the emergency department with 10/10 right SI joint pain for the past 3 days. Patient denies acute falls or traumas. Patient states that her pain is alleviated with 30 minutes of walking. She has also tried a heating pad and "arthritis cremes". She denies numbness, tingling or loss of sensation in the lower extremities. She denies bowel or bladder incontinence. She has been afebrile.   Past Medical History:  Diagnosis Date  . Acid reflux   . Diabetes mellitus without complication ([place])   . Hypertension     Patient Active Problem List   Diagnosis Date Noted  . Large hiatal hernia 07/04/2016  . Sigmoid diverticulitis 07/04/2016  . Stress incontinence 07/04/2016  . Controlled type 2 diabetes mellitus without complication, without long-term current use of insulin ([place]) 10/31/2015  . Constipation 10/31/2015  . 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 Essential (primary) hypertension 06/16/2015  . Hyperlipidemia associated with type 2 diabetes mellitus ([place]) 06/16/2015  . OP (osteoporosis) 06/16/2015  . Volume depletion 01/11/2013  . Hypercalcemia 04/22/2012    Past Surgical History:  Procedure Laterality Date  . CATARACT EXTRACTION    . COLONOSCOPY  2007   sigmoid diverticulosis  . TONSILLECTOMY    . UPPER GASTROINTESTINAL ENDOSCOPY  02/2013   Barrett's - done at [place]      Prior to Admission medications   Medication Sig Start Date End Date Taking? Authorizing Provider  Acetaminophen-Caffeine (EXCEDRIN TENSION HEADACHE) 500-65 MG TABS Take by mouth.    [provider]  aspirin 81 MG tablet Take by mouth.    [provider]   atorvastatin (LIPITOR) 20 MG tablet Take 1 tablet (20 mg total) by mouth daily. 07/04/16   Glean Hess, MD  Bisacodyl (LAXATIVE PO) Take by mouth.    [provider]  enalapril (VASOTEC) 20 MG tablet TAKE 1 TABLET DAILY 02/09/17   Glean Hess, MD  ferrous sulfate (IRON SUPPLEMENT) 325 (65 FE) MG tablet Take 1 tablet by mouth daily.    [provider]  FREESTYLE LITE test strip Use to test blood sugar daily 02/27/16   Glean Hess, MD  Lancets (FREESTYLE) lancets USE 1 LANCET DAILY 05/31/16   Glean Hess, MD  latanoprost (XALATAN) 0.005 % ophthalmic solution Place 1 drop into both eyes at bedtime. 10/09/15   [provider]  metFORMIN (GLUCOPHAGE) 500 MG tablet Take 1 tablet (500 mg total) by mouth daily. 07/04/16   Glean Hess, MD  MULTIPLE VITAMINS-MINERALS PO Take by mouth.    [provider]  pantoprazole (PROTONIX) 40 MG tablet TAKE 1 TABLET DAILY 02/07/17   Glean Hess, MD  predniSONE (STERAPRED UNI-PAK 21 TAB) 10 MG (21) TBPK tablet Take by mouth daily. Take 6 tablets the first day, take 5 tablets the second day, take 4 tablets the third day, take 3 tablets the fourth day, take 2 tablets the fifth day, take 1 tablet the sixth day. 02/26/17 03/04/17  Lannie Fields, PA-C  spironolactone (ALDACTONE) 25 MG tablet Take 1 tablet (25 mg total) by mouth daily. 07/04/16   Glean Hess, MD  timolol (TIMOPTIC) 0.5 % ophthalmic solution  04/10/15   [provider]  Allergies Patient has no known allergies.  Family History  Problem Relation Age of Onset  . Diabetes Father   . CAD Father   . Breast cancer Neg Hx     Social History Social History  Substance Use Topics  . Smoking status: Never Smoker  . Smokeless tobacco: Never Used  . Alcohol use No     Review of Systems  Constitutional: No fever/chills Cardiovascular: no chest pain. Respiratory: no cough. No SOB. Musculoskeletal: Patient has right SI joint  pain Skin: Negative for rash, abrasions, lacerations, ecchymosis. Neurological: Negative for headaches, focal weakness or numbness. ____________________________________________   PHYSICAL EXAM:  VITAL SIGNS: ED Triage Vitals  Enc Vitals Group     BP 02/26/17 2031 (!) 161/87     Pulse Rate 02/26/17 2031 86     Resp 02/26/17 2031 18     Temp 02/26/17 2031 97.9 F (36.6 C)     Temp Source 02/26/17 2031 Oral     SpO2 02/26/17 2031 98 %     Weight 02/26/17 2032 145 lb (65.8 kg)     Height --      Head Circumference --      Peak Flow --      Pain Score 02/26/17 2031 10     Pain Loc --      Pain Edu? --      Excl. in [place]? --      Constitutional: Alert and oriented. Well appearing and in no acute distress. Eyes: Conjunctivae are normal. PERRL. EOMI. Head: Atraumatic. Cardiovascular: Normal rate, regular rhythm. Normal S1 and S2.  Good peripheral circulation. Respiratory: Normal respiratory effort without tachypnea or retractions. Lungs CTAB. Good air entry to the bases with no decreased or absent breath sounds. Musculoskeletal:Patient has tenderness with palpation along the right SI joint. No pain was elicited with palpation along the lumbar spine. Negative straight leg raise test bilaterally. Palpable dorsalis pedis pulse bilaterally and symmetrically. Neurologic:  Normal speech and language. No gross focal neurologic deficits are appreciated.  Skin:  Skin is warm, dry and intact. No rash noted. Psychiatric: Mood and affect are normal. Speech and behavior are normal. Patient exhibits appropriate insight and judgement.   ____________________________________________   LABS (all labs ordered are listed, but only abnormal results are displayed)  Labs Reviewed - No data to display ____________________________________________  EKG   ____________________________________________  RADIOLOGY   No results  found.  ____________________________________________    PROCEDURES  Procedure(s) performed:    Procedures    Medications  methylPREDNISolone sodium succinate (SOLU-MEDROL) 125 mg/2 mL injection 125 mg (125 mg Intramuscular Given 02/26/17 2207)     ____________________________________________   INITIAL IMPRESSION / ASSESSMENT AND PLAN / ED COURSE  Pertinent labs & imaging results that were available during my care of the patient were reviewed by me and considered in my medical decision making (see chart for details).  Review of the [place] CSRS was performed in accordance of the [place] prior to dispensing any controlled drugs.     Assessment and plan: SI joint arthritis Patient presents to the emergency department with right SI joint pain. On physical exam, patient had tenderness over the right SI joint. Patient education was provided regarding SI joint injections. Patient was given an injection of Solu-Medrol in the emergency department and discharged with tapering prednisone. A referral was given to orthopedics, Dr. Marry Guan. Vital signs were reassuring prior to discharge. All patient questions were answered.  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 Take 6 tablets the first day, take 5 tablets the second day, take 4 tablets the third day, take 3 tablets the fourth day, take 2 tablets the fifth day, take 1 tablet the sixth day., Starting Wed 02/26/2017, Until Tue 03/04/2017, Print            This chart was dictated using voice recognition software/Dragon. Despite best efforts to proofread, errors can occur which can change the meaning. Any change was  purely unintentional.    Lannie Fields, PA-C 02/27/17 [id number]    Hinda Kehr, MD 02/27/17 [phone]

## 2017-03-11 ENCOUNTER — Other Ambulatory Visit: Payer: Self-pay | Admitting: Internal Medicine

## 2017-03-24 DIAGNOSIS — H401121 Primary open-angle glaucoma, left eye, mild stage: Secondary | ICD-10-CM | POA: Diagnosis not present

## 2017-03-24 DIAGNOSIS — E119 Type 2 diabetes mellitus without complications: Secondary | ICD-10-CM | POA: Diagnosis not present

## 2017-03-24 DIAGNOSIS — H401113 Primary open-angle glaucoma, right eye, severe stage: Secondary | ICD-10-CM | POA: Diagnosis not present

## 2017-03-24 DIAGNOSIS — Z961 Presence of intraocular lens: Secondary | ICD-10-CM | POA: Diagnosis not present

## 2017-03-24 DIAGNOSIS — H338 Other retinal detachments: Secondary | ICD-10-CM | POA: Diagnosis not present

## 2017-03-28 DIAGNOSIS — M533 Sacrococcygeal disorders, not elsewhere classified: Secondary | ICD-10-CM | POA: Diagnosis not present

## 2017-03-28 DIAGNOSIS — M545 Low back pain: Secondary | ICD-10-CM | POA: Diagnosis not present

## 2017-04-22 ENCOUNTER — Other Ambulatory Visit: Payer: Self-pay | Admitting: Internal Medicine

## 2017-04-22 DIAGNOSIS — E119 Type 2 diabetes mellitus without complications: Secondary | ICD-10-CM

## 2017-04-25 ENCOUNTER — Ambulatory Visit: Payer: Self-pay | Admitting: Internal Medicine

## 2017-05-08 ENCOUNTER — Encounter: Payer: Self-pay | Admitting: Internal Medicine

## 2017-05-08 ENCOUNTER — Ambulatory Visit (INDEPENDENT_AMBULATORY_CARE_PROVIDER_SITE_OTHER): Payer: Medicare Other | Admitting: Internal Medicine

## 2017-05-08 VITALS — BP 144/80 | HR 71 | Ht 63.5 in | Wt 142.0 lb

## 2017-05-08 DIAGNOSIS — E1169 Type 2 diabetes mellitus with other specified complication: Secondary | ICD-10-CM

## 2017-05-08 DIAGNOSIS — E785 Hyperlipidemia, unspecified: Secondary | ICD-10-CM | POA: Diagnosis not present

## 2017-05-08 DIAGNOSIS — I10 Essential (primary) hypertension: Secondary | ICD-10-CM | POA: Diagnosis not present

## 2017-05-08 DIAGNOSIS — R590 Localized enlarged lymph nodes: Secondary | ICD-10-CM

## 2017-05-08 DIAGNOSIS — E119 Type 2 diabetes mellitus without complications: Secondary | ICD-10-CM | POA: Diagnosis not present

## 2017-05-08 DIAGNOSIS — K2270 Barrett's esophagus without dysplasia: Secondary | ICD-10-CM

## 2017-05-08 DIAGNOSIS — Z1211 Encounter for screening for malignant neoplasm of colon: Secondary | ICD-10-CM | POA: Diagnosis not present

## 2017-05-08 DIAGNOSIS — H401190 Primary open-angle glaucoma, unspecified eye, stage unspecified: Secondary | ICD-10-CM | POA: Insufficient documentation

## 2017-05-08 DIAGNOSIS — H338 Other retinal detachments: Secondary | ICD-10-CM | POA: Insufficient documentation

## 2017-05-08 NOTE — Progress Notes (Signed)
Date:  05/08/2017   Name:  Lori Harrell   DOB:  November 25, 1940   MRN:  [id number]   Chief Complaint: Diabetes (BS- 105 this morning. ) and Hypertension Diabetes  She presents for her follow-up diabetic visit. She has type 2 diabetes mellitus. Her disease course has been stable. Pertinent negatives for hypoglycemia include no headaches or tremors. Pertinent negatives for diabetes include no chest pain, no fatigue, no polydipsia and no polyuria. There are no hypoglycemic complications. Her weight is stable. There is no change in her home blood glucose trend. Her breakfast blood glucose is taken between 6-7 am. Her breakfast blood glucose range is generally 90-110 mg/dl. An ACE inhibitor/angiotensin II receptor blocker is being taken. Eye exam is current.  Hypertension  This is a chronic problem. The problem is controlled. Pertinent negatives include no chest pain, headaches, palpitations or shortness of breath.  Gastroesophageal Reflux  She complains of heartburn. She reports no abdominal pain, no chest pain or no coughing. This is a chronic problem. The problem occurs occasionally. Pertinent negatives include no fatigue. Risk factors include Barrett's esophagus (had EGD last year). She has tried a PPI for the symptoms.  Colon cancer screening - last colonoscopy 2007 was normal.  She received notice that she was due for another but does not want to go to [facility] GI [facility].  Lab Results  Component Value Date   HGBA1C 5.9 (H) 10/31/2016   Lab Results  Component Value Date   CREATININE 0.93 10/31/2016     Review of Systems  Constitutional: Negative for appetite change, fatigue, fever and unexpected weight change.  HENT: Negative for tinnitus and trouble swallowing.   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  Respiratory: Negative for cough, chest tightness and shortness of breath.   Cardiovascular: Negative for chest pain, palpitations  and leg swelling.  Gastrointestinal: Positive for heartburn. Negative for abdominal pain.  Endocrine: Negative for polydipsia and polyuria.  Genitourinary: Negative for dysuria and hematuria.  Musculoskeletal: Negative for arthralgias.  Neurological: Negative for tremors, numbness and headaches.  Psychiatric/Behavioral: Negative for dysphoric mood.    Patient Active Problem List   Diagnosis Date Noted  . Large hiatal hernia 07/04/2016  . Sigmoid diverticulitis 07/04/2016  . Stress incontinence 07/04/2016  . Controlled type 2 diabetes mellitus without complication, without long-term current use of insulin ([place]) 10/31/2015  . Constipation 10/31/2015  . 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 Essential (primary) hypertension 06/16/2015  . Hyperlipidemia associated with type 2 diabetes mellitus ([place]) 06/16/2015  . OP (osteoporosis) 06/16/2015  . Volume depletion 01/11/2013  . Hypercalcemia 04/22/2012    Prior to Admission medications   Medication Sig Start Date End Date Taking? Authorizing Provider  Acetaminophen-Caffeine (EXCEDRIN TENSION HEADACHE) 500-65 MG TABS Take by mouth.   Yes [provider]  aspirin 81 MG tablet Take by mouth.   Yes [provider]  atorvastatin (LIPITOR) 20 MG tablet Take 1 tablet (20 mg total) by mouth daily. 07/04/16  Yes Glean Hess, MD  enalapril (VASOTEC) 20 MG tablet TAKE 1 TABLET DAILY 02/09/17  Yes Glean Hess, MD  ferrous sulfate (IRON SUPPLEMENT) 325 (65 FE) MG tablet Take 1 tablet by mouth 3 (three) times a week.    Yes [provider]  FREESTYLE LITE test strip USE TO TEST BLOOD SUGAR DAILY 03/11/17  Yes Glean Hess, MD  Inulin (FIBER CHOICE FRUITY BITES) 1.5 g CHEW Chew by mouth.   Yes  [provider]  Lancets (FREESTYLE) lancets USE 1 LANCET DAILY 04/22/17  Yes Glean Hess, MD  latanoprost (XALATAN) 0.005 % ophthalmic solution Place 1 drop into both eyes at bedtime. 10/09/15  Yes [provider]  metFORMIN (GLUCOPHAGE) 500 MG tablet Take 1 tablet (500 mg total) by mouth daily. 07/04/16  Yes Glean Hess, MD  MULTIPLE VITAMINS-MINERALS PO Take by mouth.   Yes [provider]  pantoprazole (PROTONIX) 40 MG tablet TAKE 1 TABLET DAILY 02/07/17  Yes Glean Hess, MD  spironolactone (ALDACTONE) 25 MG tablet Take 1 tablet (25 mg total) by mouth daily. 07/04/16  Yes Glean Hess, MD  timolol (TIMOPTIC) 0.5 % ophthalmic solution  04/10/15  Yes [provider]    No Known Allergies  Past Surgical History:  Procedure Laterality Date  . CATARACT EXTRACTION    . COLONOSCOPY  2007   sigmoid diverticulosis  . TONSILLECTOMY    . UPPER GASTROINTESTINAL ENDOSCOPY  02/2013   Barrett's - done at [place] History  Substance Use Topics  . Smoking status: Never Smoker  . Smokeless tobacco: Never Used  . Alcohol use No     Medication list has been reviewed and updated.   Physical Exam  Constitutional: She is oriented to person, place, and time. She appears well-developed. No distress.  HENT:  Head: Normocephalic and atraumatic.  Neck: Normal range of motion.  Cardiovascular: Normal rate, regular rhythm and normal heart sounds.   Pulmonary/Chest: Effort normal and breath sounds normal. No respiratory distress. She has no wheezes.  Musculoskeletal: She exhibits no edema.  Lymphadenopathy:    She has cervical adenopathy (left anterior - non tender).  Neurological: She is alert and oriented to person, place, and time.  Skin: Skin is warm and dry. No rash noted.  Psychiatric: She has a normal mood and affect. Her behavior is normal. Thought content normal.  Nursing note and vitals reviewed.   BP (!) 144/80   Pulse 71   Ht 5' 3.5" (1.613 m)   Wt 142 lb (64.4 kg)   SpO2 99%   BMI 24.76 kg/m   Assessment and Plan: 1. Essential (primary) hypertension Fair control - continue current regimen  2. Controlled  type 2 diabetes mellitus without complication, without long-term current use of insulin (HCC) stable - Hemoglobin X9J - Basic metabolic panel  3. Hyperlipidemia associated with type 2 diabetes mellitus ([place]) On statin therapy  4. Lymphadenopathy of left cervical region May be residual from recent URI Pt to monitor and call if enlarging - CBC with Differential/Platelet  5. Barrett's esophagus without dysplasia Continue PPI  6. Colon cancer screening Can call for coverage for cologuard - Cologuard   No orders of the defined types were placed in this encounter.   Halina Maidens, MD [facility]  05/08/2017

## 2017-05-09 LAB — CBC WITH DIFFERENTIAL/PLATELET
BASOS ABS: 0 10*3/uL (ref 0.0–0.2)
Basos: 1 %
EOS (ABSOLUTE): 0.1 10*3/uL (ref 0.0–0.4)
Eos: 1 %
Hematocrit: 34.2 % (ref 34.0–46.6)
Hemoglobin: 11.4 g/dL (ref 11.1–15.9)
Immature Grans (Abs): 0 10*3/uL (ref 0.0–0.1)
Immature Granulocytes: 0 %
LYMPHS ABS: 1.1 10*3/uL (ref 0.7–3.1)
Lymphs: 13 %
MCH: 31.3 pg (ref 26.6–33.0)
MCHC: 33.3 g/dL (ref 31.5–35.7)
MCV: 94 fL (ref 79–97)
MONOS ABS: 0.3 10*3/uL (ref 0.1–0.9)
Monocytes: 3 %
NEUTROS PCT: 82 %
Neutrophils Absolute: 6.6 10*3/uL (ref 1.4–7.0)
PLATELETS: 270 10*3/uL (ref 150–379)
RBC: 3.64 x10E6/uL — AB (ref 3.77–5.28)
RDW: 13.3 % (ref 12.3–15.4)
WBC: 8 10*3/uL (ref 3.4–10.8)

## 2017-05-09 LAB — BASIC METABOLIC PANEL
BUN/Creatinine Ratio: 14 (ref 12–28)
BUN: 12 mg/dL (ref 8–27)
CO2: 22 mmol/L (ref 20–29)
Calcium: 9.8 mg/dL (ref 8.7–10.3)
Chloride: 101 mmol/L (ref 96–106)
Creatinine, Ser: 0.85 mg/dL (ref 0.57–1.00)
GFR calc Af Amer: 77 mL/min/{1.73_m2} (ref >59)
GFR, EST NON AFRICAN AMERICAN: 67 mL/min/{1.73_m2} (ref >59)
GLUCOSE: 93 mg/dL (ref 65–99)
Potassium: 5 mmol/L (ref 3.5–5.2)
Sodium: 140 mmol/L (ref 134–144)

## 2017-05-09 LAB — HEMOGLOBIN A1C
ESTIMATED AVERAGE GLUCOSE: 126 mg/dL
Hgb A1c MFr Bld: 6 % — ABNORMAL HIGH (ref 4.8–5.6)

## 2017-07-07 ENCOUNTER — Ambulatory Visit: Payer: Medicare Other | Admitting: Internal Medicine

## 2017-07-07 ENCOUNTER — Ambulatory Visit
Admission: RE | Admit: 2017-07-07 | Discharge: 2017-07-07 | Disposition: A | Discharge status: Home / Self Care | Payer: Medicare Other | Source: Ambulatory Visit | Attending: Internal Medicine | Admitting: Internal Medicine

## 2017-07-07 ENCOUNTER — Encounter: Payer: Self-pay | Admitting: Internal Medicine

## 2017-07-07 ENCOUNTER — Ambulatory Visit (INDEPENDENT_AMBULATORY_CARE_PROVIDER_SITE_OTHER): Payer: Medicare Other

## 2017-07-07 ENCOUNTER — Telehealth: Payer: Self-pay

## 2017-07-07 ENCOUNTER — Other Ambulatory Visit: Payer: Self-pay | Admitting: Internal Medicine

## 2017-07-07 VITALS — BP 140/84 | HR 62 | Ht 64.0 in | Wt 138.0 lb

## 2017-07-07 VITALS — BP 140/76 | HR 76 | Temp 98.0°F | Resp 20 | Ht 64.0 in | Wt 138.0 lb

## 2017-07-07 DIAGNOSIS — M25512 Pain in left shoulder: Secondary | ICD-10-CM | POA: Insufficient documentation

## 2017-07-07 DIAGNOSIS — E119 Type 2 diabetes mellitus without complications: Secondary | ICD-10-CM | POA: Diagnosis not present

## 2017-07-07 DIAGNOSIS — M8700 Idiopathic aseptic necrosis of unspecified bone: Secondary | ICD-10-CM | POA: Insufficient documentation

## 2017-07-07 DIAGNOSIS — G8929 Other chronic pain: Secondary | ICD-10-CM

## 2017-07-07 DIAGNOSIS — E1169 Type 2 diabetes mellitus with other specified complication: Secondary | ICD-10-CM

## 2017-07-07 DIAGNOSIS — Z0000 Encounter for general adult medical examination without abnormal findings: Secondary | ICD-10-CM

## 2017-07-07 DIAGNOSIS — K2270 Barrett's esophagus without dysplasia: Secondary | ICD-10-CM

## 2017-07-07 DIAGNOSIS — Z23 Encounter for immunization: Secondary | ICD-10-CM

## 2017-07-07 DIAGNOSIS — M87822 Other osteonecrosis, left humerus: Secondary | ICD-10-CM | POA: Insufficient documentation

## 2017-07-07 DIAGNOSIS — Z1211 Encounter for screening for malignant neoplasm of colon: Secondary | ICD-10-CM

## 2017-07-07 DIAGNOSIS — E785 Hyperlipidemia, unspecified: Secondary | ICD-10-CM | POA: Diagnosis not present

## 2017-07-07 DIAGNOSIS — I6521 Occlusion and stenosis of right carotid artery: Secondary | ICD-10-CM

## 2017-07-07 DIAGNOSIS — I10 Essential (primary) hypertension: Secondary | ICD-10-CM

## 2017-07-07 DIAGNOSIS — I6523 Occlusion and stenosis of bilateral carotid arteries: Secondary | ICD-10-CM | POA: Diagnosis not present

## 2017-07-07 HISTORY — DX: Idiopathic aseptic necrosis of unspecified bone: M87.00

## 2017-07-07 LAB — POCT URINALYSIS DIPSTICK
Bilirubin, UA: NEGATIVE
GLUCOSE UA: NEGATIVE
KETONES UA: NEGATIVE
LEUKOCYTES UA: NEGATIVE
Nitrite, UA: POSITIVE
PROTEIN UA: NEGATIVE
Spec Grav, UA: 1.01 (ref 1.010–1.025)
Urobilinogen, UA: 0.2 E.U./dL
pH, UA: 5 (ref 5.0–8.0)

## 2017-07-07 NOTE — Progress Notes (Signed)
Subjective:   Lori Harrell is a 76 y.o. female who presents for Medicare Annual (Subsequent) preventive examination.  Review of Systems:  N/A Cardiac Risk Factors include: advanced age (>6men, >36 women);dyslipidemia;diabetes mellitus;hypertension     Objective:     Vitals: BP 140/76 (BP Location: Right Arm, Patient Position: Sitting, Cuff Size: Normal)   Pulse 76   Temp 98 F (36.7 C)   Resp 20   Ht 5\' 4"  (1.626 m)   Wt 138 lb (62.6 kg)   BMI 23.69 kg/m   Body mass index is 23.69 kg/m.   Tobacco History  Smoking Status  . Never Smoker  Smokeless Tobacco  . Never Used     Counseling given: Not Answered   Past Medical History:  Diagnosis Date  . Acid reflux   . Diabetes mellitus without complication ([place])   . Hypertension    Past Surgical History:  Procedure Laterality Date  . CATARACT EXTRACTION    . COLONOSCOPY  2007   sigmoid diverticulosis  . TONSILLECTOMY    . UPPER GASTROINTESTINAL ENDOSCOPY  02/2013   Barrett's - done at [facility]  . VAGINAL HYSTERECTOMY     Family History  Problem Relation Age of Onset  . Diabetes Father   . CAD Father   . Breast cancer Neg Hx    History  Sexual Activity  . Sexual activity: Not on file    Outpatient Encounter Prescriptions as of 07/07/2017  Medication Sig  . Acetaminophen-Caffeine (EXCEDRIN TENSION HEADACHE) 500-65 MG TABS Take by mouth.  Marland Kitchen aspirin 81 MG tablet Take by mouth.  Marland Kitchen atorvastatin (LIPITOR) 20 MG tablet Take 1 tablet (20 mg total) by mouth daily.  . enalapril (VASOTEC) 20 MG tablet TAKE 1 TABLET DAILY  . ferrous sulfate (IRON SUPPLEMENT) 325 (65 FE) MG tablet Take 1 tablet by mouth 3 (three) times a week.   Marland Kitchen FREESTYLE LITE test strip USE TO TEST BLOOD SUGAR DAILY  . Inulin (FIBER CHOICE FRUITY BITES) 1.5 g CHEW Chew by mouth.  . Lancets (FREESTYLE) lancets USE 1 LANCET DAILY  . latanoprost (XALATAN) 0.005 % ophthalmic solution Place 1 drop into both eyes at bedtime.  . metFORMIN  (GLUCOPHAGE) 500 MG tablet Take 1 tablet (500 mg total) by mouth daily.  . MULTIPLE VITAMINS-MINERALS PO Take by mouth.  . pantoprazole (PROTONIX) 40 MG tablet TAKE 1 TABLET DAILY  . spironolactone (ALDACTONE) 25 MG tablet Take 1 tablet (25 mg total) by mouth daily.  . timolol (TIMOPTIC) 0.5 % ophthalmic solution    No facility-administered encounter medications on file as of 07/07/2017.     Activities of Daily Living In your present state of health, do you have any difficulty performing the following activities: 07/07/2017  Hearing? N  Vision? N  Difficulty concentrating or making decisions? N  Walking or climbing stairs? N  Dressing or bathing? N  Doing errands, shopping? N  Preparing Food and eating ? N  Using the Toilet? N  In the past six months, have you accidently leaked urine? N  Do you have problems with loss of bowel control? N  Managing your Medications? N  Managing your Finances? N  Housekeeping or managing your Housekeeping? N  Some recent data might be hidden    Patient Care Team: Glean Hess, MD as PCP - General (Internal Medicine) Florinda Marker Early Chars, MD (Ophthalmology) Malissa Hippo, Gaspar Skeeters, MD as Referring Physician (Gastroenterology)    Assessment:     Exercise Activities and Dietary recommendations Current  Exercise Habits: Structured exercise class, Type of exercise: walking, Time (Minutes): 30, Frequency (Times/Week): 4, Weekly Exercise (Minutes/Week): 120, Intensity: Mild, Exercise limited by: None identified  Goals    . continue activity          Taking a class in [place] in [place] with church. She also walks 2-3 days a week 30 min and helps at church every Wed night.    . Increase water intake          Recommend to increase fluid intake from 3-4 glasses per day to 4-6 glasses per day.      Fall Risk Fall Risk  07/07/2017 07/04/2016 02/27/2016 06/23/2015  Falls in the past year? No No No No  Risk for fall due to : Impaired vision - - -    Risk for fall due to: Comment in need of updated eye exam. Scheduled to be seen in Dec 2018 - - -   Depression Screen PHQ 2/9 Scores 07/07/2017 07/04/2016 02/27/2016 06/23/2015  PHQ - 2 Score 0 0 0 0     Cognitive Function     6CIT Screen 07/07/2017 07/04/2016  What Year? 0 points 0 points  What month? 0 points 0 points  What time? 0 points 0 points  Count back from 20 0 points 0 points  Months in reverse 0 points 0 points  Repeat phrase 0 points 0 points  Total Score 0 0    Immunization History  Administered Date(s) Administered  . Influenza, High Dose Seasonal PF 07/07/2017  . Influenza,inj,Quad PF,6+ Mos 06/23/2015, 07/04/2016  . Pneumococcal Conjugate-13 06/20/2014  . Pneumococcal Polysaccharide-23 10/02/2007  . Tdap 10/01/2010   Screening Tests Health Maintenance  Topic Date Due  . INFLUENZA VACCINE  04/30/2017  . FOOT EXAM  07/04/2017  . OPHTHALMOLOGY EXAM  09/18/2017  . MAMMOGRAM  10/31/2017  . HEMOGLOBIN A1C  11/08/2017  . TETANUS/TDAP  10/01/2020  . DEXA SCAN  Addressed  . PNA vac Low Risk Adult  Completed      Plan:    I have personally reviewed and addressed the Medicare Annual Wellness questionnaire and have noted the following in the patient's chart:  A. Medical and social history B. Use of alcohol, tobacco or illicit drugs  C. Current medications and supplements D. Functional ability and status E.  Nutritional status F.  Physical activity G. Advance directives H. List of other physicians I.  Hospitalizations, surgeries, and ER visits in previous 12 months J.  [place] such as hearing and vision if needed, cognitive and depression L. Referrals and appointments - none  In addition, I have reviewed and discussed with patient certain preventive protocols, quality metrics, and best practice recommendations. A written personalized care plan for preventive services as well as general preventive health recommendations were provided to  patient.  See attached scanned questionnaire for additional information.   Signed,  Aleatha Borer, LPN Nurse Health Advisor   MD Recommendations:

## 2017-07-07 NOTE — Telephone Encounter (Signed)
Left VM calling back about Urine sample left today. Returning a call from [place].

## 2017-07-07 NOTE — Progress Notes (Signed)
Date:  07/07/2017   Name:  Lori Harrell   DOB:  1941/08/20   MRN:  [id number]   Chief Complaint: Annual Exam (Flu Vaccine- high dose. ); Shoulder Pain (Left Shoulder pain. Broke shoulder back in 2013. No having difficulty raising arm above shoulders. This starting back up 2-3 months ago and not getting better. Wants to know if can get Xray. ); Diabetes; Hypertension; and Hyperlipidemia  Lori Harrell is a 76 y.o. female who presents today for her Complete Annual Exam. She feels fairly well. She reports exercising walking 4 times per week for 30 min. She reports she is sleeping fairly well. She had lifeline screening which showed mild right-sided carotid stenosis. Also osteoporosis. Otherwise normal.  Shoulder Pain   The pain is present in the left shoulder. This is a chronic problem. The current episode started more than 1 month ago. There has been no history of extremity trauma. The quality of the pain is described as aching. The pain is mild. Associated symptoms include a limited range of motion. Pertinent negatives include no fever. hx of humerus fx 2013  Diabetes  She presents for her follow-up diabetic visit. She has type 2 diabetes mellitus. There are no hypoglycemic associated symptoms. Pertinent negatives for hypoglycemia include no dizziness, headaches, nervousness/anxiousness or tremors. Pertinent negatives for diabetes include no blurred vision, no chest pain, no fatigue, no polydipsia and no polyuria. Symptoms are stable. Current diabetic treatment includes oral agent (monotherapy). She is compliant with treatment most of the time. Her weight is stable. She participates in exercise three times a week. Her breakfast blood glucose is taken between 6-7 am. Her breakfast blood glucose range is generally 110-130 mg/dl.  Hypertension  This is a chronic problem. The problem is unchanged. The problem is controlled. Pertinent negatives include no blurred vision, chest pain,  headaches, palpitations or shortness of breath. Past treatments include ACE inhibitors and diuretics.  Hyperlipidemia  This is a chronic problem. Pertinent negatives include no chest pain or shortness of breath. Current antihyperlipidemic treatment includes statins.   Lab Results  Component Value Date   HGBA1C 6.0 (H) 05/08/2017   Lab Results  Component Value Date   CHOL 160 07/04/2016   HDL 45 07/04/2016   LDLCALC 97 07/04/2016   TRIG 88 07/04/2016   CHOLHDL 3.6 07/04/2016     Review of Systems  Constitutional: Negative for chills, fatigue and fever.  HENT: Negative for congestion, hearing loss, tinnitus, trouble swallowing and voice change.   Eyes: Negative for blurred vision and visual disturbance.  Respiratory: Negative for cough, chest tightness, shortness of breath and wheezing.   Cardiovascular: Negative for chest pain, palpitations and leg swelling.  Gastrointestinal: Negative for abdominal pain, constipation, diarrhea and vomiting.  Endocrine: Negative for polydipsia and polyuria.  Genitourinary: Negative for dysuria, frequency, genital sores, vaginal bleeding and vaginal discharge.  Musculoskeletal: Positive for arthralgias (left shoulder) and back pain. Negative for gait problem and joint swelling.  Skin: Negative for color change and rash.  Neurological: Negative for dizziness, tremors, light-headedness and headaches.  Hematological: Negative for adenopathy. Does not bruise/bleed easily.  Psychiatric/Behavioral: Negative for dysphoric mood and sleep disturbance. The patient is not nervous/anxious.     Patient Active Problem List   Diagnosis Date Noted  . Primary open angle glaucoma 05/08/2017  . 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 Large hiatal hernia 07/04/2016  . Sigmoid diverticulitis 07/04/2016  . Stress incontinence 07/04/2016  . Controlled type 2 diabetes mellitus without complication,  without long-term current use of insulin ([place]) 10/31/2015  .  Constipation 10/31/2015  . 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 Essential (primary) hypertension 06/16/2015  . Hyperlipidemia associated with type 2 diabetes mellitus ([place]) 06/16/2015  . OP (osteoporosis) 06/16/2015  . Hypercalcemia 04/22/2012    Prior to Admission medications   Medication Sig Start Date End Date Taking? Authorizing Provider  Acetaminophen-Caffeine (EXCEDRIN TENSION HEADACHE) 500-65 MG TABS Take by mouth.   Yes [provider]  aspirin 81 MG tablet Take by mouth.   Yes [provider]  atorvastatin (LIPITOR) 20 MG tablet Take 1 tablet (20 mg total) by mouth daily. 07/04/16  Yes Glean Hess, MD  enalapril (VASOTEC) 20 MG tablet TAKE 1 TABLET DAILY 02/09/17  Yes Glean Hess, MD  ferrous sulfate (IRON SUPPLEMENT) 325 (65 FE) MG tablet Take 1 tablet by mouth 3 (three) times a week.    Yes [provider]  FREESTYLE LITE test strip USE TO TEST BLOOD SUGAR DAILY 03/11/17  Yes Glean Hess, MD  Inulin (FIBER CHOICE FRUITY BITES) 1.5 g CHEW Chew by mouth.   Yes [provider]  Lancets (FREESTYLE) lancets USE 1 LANCET DAILY 04/22/17  Yes Glean Hess, MD  latanoprost (XALATAN) 0.005 % ophthalmic solution Place 1 drop into both eyes at bedtime. 10/09/15  Yes [provider]  metFORMIN (GLUCOPHAGE) 500 MG tablet Take 1 tablet (500 mg total) by mouth daily. 07/04/16  Yes Glean Hess, MD  MULTIPLE VITAMINS-MINERALS PO Take by mouth.   Yes [provider]  pantoprazole (PROTONIX) 40 MG tablet TAKE 1 TABLET DAILY 02/07/17  Yes Glean Hess, MD  spironolactone (ALDACTONE) 25 MG tablet Take 1 tablet (25 mg total) by mouth daily. 07/04/16  Yes Glean Hess, MD  timolol (TIMOPTIC) 0.5 % ophthalmic solution  04/10/15  Yes [provider]    No Known Allergies  Past Surgical History:  Procedure Laterality Date  . CATARACT EXTRACTION    . COLONOSCOPY  2007   sigmoid diverticulosis  . TONSILLECTOMY    .  UPPER GASTROINTESTINAL ENDOSCOPY  02/2013   Barrett's - done at [facility] History  Substance Use Topics  . Smoking status: Never Smoker  . Smokeless tobacco: Never Used  . Alcohol use No     Medication list has been reviewed and updated.  PHQ 2/9 Scores 07/04/2016 02/27/2016 06/23/2015  PHQ - 2 Score 0 0 0    Physical Exam  Constitutional: She is oriented to person, place, and time. She appears well-developed and well-nourished. No distress.  HENT:  Head: Normocephalic and atraumatic.  Right Ear: Tympanic membrane and ear canal normal.  Left Ear: Tympanic membrane and ear canal normal.  Nose: Right sinus exhibits no maxillary sinus tenderness. Left sinus exhibits no maxillary sinus tenderness.  Mouth/Throat: Uvula is midline and oropharynx is clear and moist.  Eyes: Conjunctivae and EOM are normal. Right eye exhibits no discharge. Left eye exhibits no discharge. No scleral icterus.  Neck: Normal range of motion. Carotid bruit is not present. No erythema present. No thyromegaly present.  Cardiovascular: Normal rate, regular rhythm, normal heart sounds and normal pulses.   Pulmonary/Chest: Effort normal. No respiratory distress. She has no wheezes. Right breast exhibits no mass, no nipple discharge, no skin change and no tenderness. Left breast exhibits no mass, no nipple discharge, no skin change and no tenderness.  Abdominal: Soft. Bowel sounds are normal. There is no  hepatosplenomegaly. There is no tenderness. There is no CVA tenderness.  Musculoskeletal: She exhibits no edema or tenderness.       Left shoulder: She exhibits decreased range of motion. She exhibits no tenderness, no bony tenderness, no swelling and no deformity.  Lymphadenopathy:    She has no cervical adenopathy.    She has no axillary adenopathy.  Neurological: She is alert and oriented to person, place, and time. She has normal reflexes. No cranial nerve deficit or sensory  deficit.  Skin: Skin is warm, dry and intact. No rash noted.  Psychiatric: She has a normal mood and affect. Her speech is normal and behavior is normal. Thought content normal. Cognition and memory are normal.  Nursing note and vitals reviewed.   BP (!) 162/90 (BP Location: Right Arm, Patient Position: Sitting, Cuff Size: Normal)   Pulse 62   Ht 5\' 4"  (1.626 m)   Wt 138 lb (62.6 kg)   SpO2 100%   BMI 23.69 kg/m   Assessment and Plan: 1. Essential (primary) hypertension Fair control - CBC with Differential/Platelet - Comprehensive metabolic panel - TSH - POCT urinalysis dipstick - positive dip but QNS to spin (will ask pt to return for recheck)  2. Barrett's esophagus without dysplasia Continue PPI - CBC with Differential/Platelet  3. Hyperlipidemia associated with type 2 diabetes mellitus ([place]) On statin therapy - Lipid panel  4. Controlled type 2 diabetes mellitus without complication, without long-term current use of insulin ([facility]) Doing well - Hemoglobin A1c  5. Carotid stenosis, asymptomatic, right Will get formal study - US Carotid Duplex Bilateral; Future  6. 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 Need for influenza vaccination - Flu vaccine HIGH DOSE PF   No orders of the defined types were placed in this encounter.   Partially dictated using Editor, commissioning. Any errors are unintentional.  Halina Maidens, MD [facility]  07/07/2017

## 2017-07-07 NOTE — Patient Instructions (Signed)
Lori Harrell , Thank you for taking time to come for your Medicare Wellness Visit. I appreciate your ongoing commitment to your health goals. Please review the following plan we discussed and let me know if I can assist you in the future.   Screening recommendations/referrals: Colonoscopy: Cologuard is covered under insurance and would like to proceed with testing. Mammogram: complete Bone Density: complete Recommended yearly ophthalmology/optometry visit for glaucoma screening and checkup Recommended yearly dental visit for hygiene and checkup  Vaccinations: Influenza vaccine: given today Pneumococcal vaccine: completed series Tdap vaccine: up to date Shingles vaccine: declined. Please check with your insurance company regarding your out of pocket expense.    Advanced directives: Copy of your POA (Power of Attorney) and/or Living can be located in your chart.   Conditions/risks identified: Recommend to increase fluid intake from 3-4 glasses per day to 4-6 glasses per day; Fall risk prevention discussed  Next appointment: Follow up annual wellness in one year.   Preventive Care 77 Years and Older, Female Preventive care refers to lifestyle choices and visits with your health care provider that can promote health and wellness. What does preventive care include?  A yearly physical exam. This is also called an annual well check.  Dental exams once or twice a year.  Routine eye exams. Ask your health care provider how often you should have your eyes checked.  Personal lifestyle choices, including:  Daily care of your teeth and gums.  Regular physical activity.  Eating a healthy diet.  Avoiding tobacco and drug use.  Limiting alcohol use.  Practicing safe sex.  Taking low-dose aspirin every day.  Taking vitamin and mineral supplements as recommended by your health care provider. What happens during an annual well check? The services and screenings done by your health  care provider during your annual well check will depend on your age, overall health, lifestyle risk factors, and family history of disease. Counseling  Your health care provider may ask you questions about your:  Alcohol use.  Tobacco use.  Drug use.  Emotional well-being.  Home and relationship well-being.  Sexual activity.  Eating habits.  History of falls.  Memory and ability to understand (cognition).  Work and work Statistician.  Reproductive health. Screening  You may have the following tests or measurements:  Height, weight, and BMI.  Blood pressure.  Lipid and cholesterol levels. These may be checked every 5 years, or more frequently if you are over 77 years old.  Skin check.  Lung cancer screening. You may have this screening every year starting at age 77 if you have a 30-pack-year history of smoking and currently smoke or have quit within the past 15 years.  Fecal occult blood test (FOBT) of the stool. You may have this test every year starting at age 104.  Flexible sigmoidoscopy or colonoscopy. You may have a sigmoidoscopy every 5 years or a colonoscopy every 10 years starting at age 77.  Hepatitis C blood test.  Hepatitis B blood test.  Sexually transmitted disease (STD) testing.  Diabetes screening. This is done by checking your blood sugar (glucose) after you have not eaten for a while (fasting). You may have this done every 1-3 years.  Bone density scan. This is done to screen for osteoporosis. You may have this done starting at age 77.  Mammogram. This may be done every 1-2 years. Talk to your health care provider about how often you should have regular mammograms. Talk with your health care provider about your test  results, treatment options, and if necessary, the need for more tests. Vaccines  Your health care provider may recommend certain vaccines, such as:  Influenza vaccine. This is recommended every year.  Tetanus, diphtheria, and  acellular pertussis (Tdap, Td) vaccine. You may need a Td booster every 10 years.  Zoster vaccine. You may need this after age 77.  Pneumococcal 13-valent conjugate (PCV13) vaccine. One dose is recommended after age 77.  Pneumococcal polysaccharide (PPSV23) vaccine. One dose is recommended after age 77. Talk to your health care provider about which screenings and vaccines you need and how often you need them. This information is not intended to replace advice given to you by your health care provider. Make sure you discuss any questions you have with your health care provider. Document Released: 10/13/2015 Document Revised: 06/05/2016 Document Reviewed: 07/18/2015 Elsevier Interactive Patient Education  2017 [employer] Prevention in the Home Falls can cause injuries. They can happen to people of all ages. There are many things you can do to make your home safe and to help prevent falls. What can I do on the outside of my home?  Regularly fix the edges of walkways and driveways and fix any cracks.  Remove anything that might make you trip as you walk through a door, such as a raised step or threshold.  Trim any bushes or trees on the path to your home.  Use bright outdoor lighting.  Clear any walking paths of anything that might make someone trip, such as rocks or tools.  Regularly check to see if handrails are loose or broken. Make sure that both sides of any steps have handrails.  Any raised decks and porches should have guardrails on the edges.  Have any leaves, snow, or ice cleared regularly.  Use sand or salt on walking paths during winter.  Clean up any spills in your garage right away. This includes oil or grease spills. What can I do in the bathroom?  Use night lights.  Install grab bars by the toilet and in the tub and shower. Do not use towel bars as grab bars.  Use non-skid mats or decals in the tub or shower.  If you need to sit down in the shower, use  a plastic, non-slip stool.  Keep the floor dry. Clean up any water that spills on the floor as soon as it happens.  Remove soap buildup in the tub or shower regularly.  Attach bath mats securely with double-sided non-slip rug tape.  Do not have throw rugs and other things on the floor that can make you trip. What can I do in the bedroom?  Use night lights.  Make sure that you have a light by your bed that is easy to reach.  Do not use any sheets or blankets that are too big for your bed. They should not hang down onto the floor.  Have a firm chair that has side arms. You can use this for support while you get dressed.  Do not have throw rugs and other things on the floor that can make you trip. What can I do in the kitchen?  Clean up any spills right away.  Avoid walking on wet floors.  Keep items that you use a lot in easy-to-reach places.  If you need to reach something above you, use a strong step stool that has a grab bar.  Keep electrical cords out of the way.  Do not use floor polish or wax that makes  floors slippery. If you must use wax, use non-skid floor wax.  Do not have throw rugs and other things on the floor that can make you trip. What can I do with my stairs?  Do not leave any items on the stairs.  Make sure that there are handrails on both sides of the stairs and use them. Fix handrails that are broken or loose. Make sure that handrails are as long as the stairways.  Check any carpeting to make sure that it is firmly attached to the stairs. Fix any carpet that is loose or worn.  Avoid having throw rugs at the top or bottom of the stairs. If you do have throw rugs, attach them to the floor with carpet tape.  Make sure that you have a light switch at the top of the stairs and the bottom of the stairs. If you do not have them, ask someone to add them for you. What else can I do to help prevent falls?  Wear shoes that:  Do not have high heels.  Have  rubber bottoms.  Are comfortable and fit you well.  Are closed at the toe. Do not wear sandals.  If you use a stepladder:  Make sure that it is fully opened. Do not climb a closed stepladder.  Make sure that both sides of the stepladder are locked into place.  Ask someone to hold it for you, if possible.  Clearly mark and make sure that you can see:  Any grab bars or handrails.  First and last steps.  Where the edge of each step is.  Use tools that help you move around (mobility aids) if they are needed. These include:  Canes.  Walkers.  Scooters.  Crutches.  Turn on the lights when you go into a dark area. Replace any light bulbs as soon as they burn out.  Set up your furniture so you have a clear path. Avoid moving your furniture around.  If any of your floors are uneven, fix them.  If there are any pets around you, be aware of where they are.  Review your medicines with your doctor. Some medicines can make you feel dizzy. This can increase your chance of falling. Ask your doctor what other things that you can do to help prevent falls. This information is not intended to replace advice given to you by your health care provider. Make sure you discuss any questions you have with your health care provider. Document Released: 07/13/2009 Document Revised: 02/22/2016 Document Reviewed: 10/21/2014 Elsevier Interactive Patient Education  2017 [employer].

## 2017-07-08 ENCOUNTER — Other Ambulatory Visit: Payer: Self-pay | Admitting: Internal Medicine

## 2017-07-08 DIAGNOSIS — Z1231 Encounter for screening mammogram for malignant neoplasm of breast: Secondary | ICD-10-CM

## 2017-07-08 LAB — CBC WITH DIFFERENTIAL/PLATELET
BASOS: 1 %
Basophils Absolute: 0.1 10*3/uL (ref 0.0–0.2)
EOS (ABSOLUTE): 0.1 10*3/uL (ref 0.0–0.4)
EOS: 1 %
Hematocrit: 36.9 % (ref 34.0–46.6)
Hemoglobin: 12 g/dL (ref 11.1–15.9)
Immature Grans (Abs): 0 10*3/uL (ref 0.0–0.1)
Immature Granulocytes: 0 %
LYMPHS ABS: 1.3 10*3/uL (ref 0.7–3.1)
Lymphs: 17 %
MCH: 31.1 pg (ref 26.6–33.0)
MCHC: 32.5 g/dL (ref 31.5–35.7)
MCV: 96 fL (ref 79–97)
Monocytes Absolute: 0.4 10*3/uL (ref 0.1–0.9)
Monocytes: 6 %
Neutrophils Absolute: 5.9 10*3/uL (ref 1.4–7.0)
Neutrophils: 75 %
PLATELETS: 247 10*3/uL (ref 150–379)
RBC: 3.86 x10E6/uL (ref 3.77–5.28)
RDW: 13 % (ref 12.3–15.4)
WBC: 7.8 10*3/uL (ref 3.4–10.8)

## 2017-07-08 LAB — COMPREHENSIVE METABOLIC PANEL
A/G RATIO: 2 (ref 1.2–2.2)
ALT: 17 IU/L (ref 0–32)
AST: 25 IU/L (ref 0–40)
Albumin: 4.5 g/dL (ref 3.5–4.8)
Alkaline Phosphatase: 42 IU/L (ref 39–117)
BILIRUBIN TOTAL: 0.5 mg/dL (ref 0.0–1.2)
BUN / CREAT RATIO: 15 (ref 12–28)
BUN: 13 mg/dL (ref 8–27)
CHLORIDE: 101 mmol/L (ref 96–106)
CO2: 21 mmol/L (ref 20–29)
Calcium: 9.9 mg/dL (ref 8.7–10.3)
Creatinine, Ser: 0.87 mg/dL (ref 0.57–1.00)
GFR, EST AFRICAN AMERICAN: 74 mL/min/{1.73_m2} (ref >59)
GFR, EST NON AFRICAN AMERICAN: 64 mL/min/{1.73_m2} (ref >59)
GLOBULIN, TOTAL: 2.2 g/dL (ref 1.5–4.5)
Glucose: 99 mg/dL (ref 65–99)
POTASSIUM: 4.8 mmol/L (ref 3.5–5.2)
SODIUM: 140 mmol/L (ref 134–144)
TOTAL PROTEIN: 6.7 g/dL (ref 6.0–8.5)

## 2017-07-08 LAB — LIPID PANEL
CHOL/HDL RATIO: 3.2 ratio (ref 0.0–4.4)
Cholesterol, Total: 148 mg/dL (ref 100–199)
HDL: 46 mg/dL (ref >39)
LDL CALC: 87 mg/dL (ref 0–99)
Triglycerides: 77 mg/dL (ref 0–149)
VLDL CHOLESTEROL CAL: 15 mg/dL (ref 5–40)

## 2017-07-08 LAB — HEMOGLOBIN A1C
Est. average glucose Bld gHb Est-mCnc: 120 mg/dL
Hgb A1c MFr Bld: 5.8 % — ABNORMAL HIGH (ref 4.8–5.6)

## 2017-07-08 LAB — TSH: TSH: 4.01 u[IU]/mL (ref 0.450–4.500)

## 2017-07-10 DIAGNOSIS — M19012 Primary osteoarthritis, left shoulder: Secondary | ICD-10-CM | POA: Diagnosis not present

## 2017-07-10 DIAGNOSIS — E119 Type 2 diabetes mellitus without complications: Secondary | ICD-10-CM | POA: Diagnosis not present

## 2017-07-11 ENCOUNTER — Other Ambulatory Visit (INDEPENDENT_AMBULATORY_CARE_PROVIDER_SITE_OTHER): Payer: Medicare Other

## 2017-07-11 ENCOUNTER — Other Ambulatory Visit: Payer: Self-pay | Admitting: Internal Medicine

## 2017-07-11 DIAGNOSIS — R8290 Unspecified abnormal findings in urine: Secondary | ICD-10-CM

## 2017-07-11 LAB — POCT URINALYSIS DIPSTICK
BILIRUBIN UA: NEGATIVE
GLUCOSE UA: NEGATIVE
KETONES UA: NEGATIVE
Nitrite, UA: POSITIVE
Protein, UA: NEGATIVE
SPEC GRAV UA: 1.01 (ref 1.010–1.025)
Urobilinogen, UA: 0.2 E.U./dL
pH, UA: 6 (ref 5.0–8.0)

## 2017-07-15 ENCOUNTER — Other Ambulatory Visit: Payer: Self-pay | Admitting: Internal Medicine

## 2017-07-15 LAB — URINE CULTURE

## 2017-07-15 MED ORDER — SULFAMETHOXAZOLE-TRIMETHOPRIM 800-160 MG PO TABS: 1.0000 | ORAL_TABLET | Freq: Two times a day (BID) | ORAL | 0 refills | Status: AC

## 2017-07-15 NOTE — Progress Notes (Signed)
Sent!

## 2017-07-15 NOTE — Progress Notes (Signed)
[facility] In [place].

## 2017-07-21 ENCOUNTER — Other Ambulatory Visit: Payer: Self-pay | Admitting: Internal Medicine

## 2017-07-21 DIAGNOSIS — I10 Essential (primary) hypertension: Secondary | ICD-10-CM

## 2017-07-21 DIAGNOSIS — E119 Type 2 diabetes mellitus without complications: Secondary | ICD-10-CM

## 2017-07-21 DIAGNOSIS — E785 Hyperlipidemia, unspecified: Principal | ICD-10-CM

## 2017-07-21 DIAGNOSIS — E1169 Type 2 diabetes mellitus with other specified complication: Secondary | ICD-10-CM

## 2017-08-06 DIAGNOSIS — Z1211 Encounter for screening for malignant neoplasm of colon: Secondary | ICD-10-CM | POA: Diagnosis not present

## 2017-08-13 LAB — COLOGUARD

## 2017-09-29 DIAGNOSIS — E119 Type 2 diabetes mellitus without complications: Secondary | ICD-10-CM | POA: Diagnosis not present

## 2017-09-29 DIAGNOSIS — H1045 Other chronic allergic conjunctivitis: Secondary | ICD-10-CM | POA: Diagnosis not present

## 2017-09-29 DIAGNOSIS — H5213 Myopia, bilateral: Secondary | ICD-10-CM | POA: Diagnosis not present

## 2017-09-29 DIAGNOSIS — H52223 Regular astigmatism, bilateral: Secondary | ICD-10-CM | POA: Diagnosis not present

## 2017-09-29 DIAGNOSIS — H401113 Primary open-angle glaucoma, right eye, severe stage: Secondary | ICD-10-CM | POA: Diagnosis not present

## 2017-09-29 DIAGNOSIS — H338 Other retinal detachments: Secondary | ICD-10-CM | POA: Diagnosis not present

## 2017-09-29 DIAGNOSIS — Z961 Presence of intraocular lens: Secondary | ICD-10-CM | POA: Diagnosis not present

## 2017-09-29 DIAGNOSIS — H401121 Primary open-angle glaucoma, left eye, mild stage: Secondary | ICD-10-CM | POA: Diagnosis not present

## 2017-09-29 DIAGNOSIS — H524 Presbyopia: Secondary | ICD-10-CM | POA: Diagnosis not present

## 2017-10-01 ENCOUNTER — Encounter: Payer: Self-pay | Admitting: Internal Medicine

## 2017-10-01 LAB — HM DIABETES EYE EXAM

## 2018-01-05 ENCOUNTER — Ambulatory Visit
Admission: RE | Admit: 2018-01-05 | Discharge: 2018-01-05 | Disposition: A | Discharge status: Home / Self Care | Payer: Medicare Other | Source: Ambulatory Visit | Attending: Internal Medicine | Admitting: Internal Medicine

## 2018-01-05 ENCOUNTER — Ambulatory Visit (INDEPENDENT_AMBULATORY_CARE_PROVIDER_SITE_OTHER): Payer: Medicare Other | Admitting: Internal Medicine

## 2018-01-05 ENCOUNTER — Encounter: Payer: Self-pay | Admitting: Internal Medicine

## 2018-01-05 VITALS — BP 144/82 | HR 62 | Ht 64.0 in | Wt 137.6 lb

## 2018-01-05 DIAGNOSIS — E785 Hyperlipidemia, unspecified: Secondary | ICD-10-CM | POA: Diagnosis not present

## 2018-01-05 DIAGNOSIS — I10 Essential (primary) hypertension: Secondary | ICD-10-CM | POA: Diagnosis not present

## 2018-01-05 DIAGNOSIS — Z1231 Encounter for screening mammogram for malignant neoplasm of breast: Secondary | ICD-10-CM | POA: Diagnosis not present

## 2018-01-05 DIAGNOSIS — E1169 Type 2 diabetes mellitus with other specified complication: Secondary | ICD-10-CM | POA: Diagnosis not present

## 2018-01-05 DIAGNOSIS — E119 Type 2 diabetes mellitus without complications: Secondary | ICD-10-CM | POA: Diagnosis not present

## 2018-01-05 NOTE — Progress Notes (Signed)
Date:  01/05/2018   Name:  Lori Harrell   DOB:  1941/05/26   MRN:  [id number]   Chief Complaint: Diabetes and Hypertension Diabetes  She presents for her follow-up diabetic visit. She has type 2 diabetes mellitus. Pertinent negatives for hypoglycemia include no headaches or tremors. Pertinent negatives for diabetes include no chest pain, no fatigue, no polydipsia and no polyuria. Symptoms are stable. Current diabetic treatment includes oral agent (monotherapy). She is compliant with treatment all of the time. Her weight is stable. She monitors blood glucose at home 1-2 x per day. Her breakfast blood glucose is taken between 6-7 am. Her breakfast blood glucose range is generally 110-130 mg/dl. An ACE inhibitor/angiotensin II receptor blocker is being taken. Eye exam is current.  Hypertension  This is a chronic problem. The problem is controlled. Pertinent negatives include no chest pain, headaches, palpitations or shortness of breath. Past treatments include beta blockers and ACE inhibitors. The current treatment provides significant improvement.  Hyperlipidemia  This is a chronic problem. Pertinent negatives include no chest pain or shortness of breath. Current antihyperlipidemic treatment includes statins.   Lab Results  Component Value Date   CREATININE 0.87 07/07/2017   BUN 13 07/07/2017   NA 140 07/07/2017   K 4.8 07/07/2017   CL 101 07/07/2017   CO2 21 07/07/2017   Lab Results  Component Value Date   CHOL 148 07/07/2017   HDL 46 07/07/2017   LDLCALC 87 07/07/2017   TRIG 77 07/07/2017   CHOLHDL 3.2 07/07/2017      Review of Systems  Constitutional: Negative for appetite change, fatigue, fever and unexpected weight change.  HENT: Negative for tinnitus and trouble swallowing.   Eyes: Negative for visual disturbance.  Respiratory: Negative for cough, chest tightness and shortness of breath.   Cardiovascular: Negative for chest pain, palpitations and leg swelling.    Gastrointestinal: Negative for abdominal pain.  Endocrine: Negative for polydipsia and polyuria.  Genitourinary: Negative for dysuria and hematuria.  Musculoskeletal: Positive for arthralgias (shoulders).  Skin: Negative for rash.  Neurological: Negative for tremors, numbness and headaches.  Psychiatric/Behavioral: Negative for dysphoric mood and sleep disturbance.    Patient Active Problem List   Diagnosis Date Noted  . Carotid artery stenosis, asymptomatic, bilateral 07/07/2017  . Primary open angle glaucoma 05/08/2017  . 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 Large hiatal hernia 07/04/2016  . Sigmoid diverticulitis 07/04/2016  . Stress incontinence 07/04/2016  . Controlled type 2 diabetes mellitus without complication, without long-term current use of insulin ([place]) 10/31/2015  . Constipation 10/31/2015  . 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 Essential (primary) hypertension 06/16/2015  . Hyperlipidemia associated with type 2 diabetes mellitus ([place]) 06/16/2015  . OP (osteoporosis) 06/16/2015  . Hypercalcemia 04/22/2012    Prior to Admission medications   Medication Sig Start Date End Date Taking? Authorizing Provider  Acetaminophen-Caffeine (EXCEDRIN TENSION HEADACHE) 500-65 MG TABS Take by mouth.    [provider]  aspirin 81 MG tablet Take by mouth.    [provider]  atorvastatin (LIPITOR) 20 MG tablet TAKE 1 TABLET DAILY 07/21/17   Glean Hess, MD  enalapril (VASOTEC) 20 MG tablet TAKE 1 TABLET DAILY 02/09/17   Glean Hess, MD  ferrous sulfate (IRON SUPPLEMENT) 325 (65 FE) MG tablet Take 1 tablet by mouth 3 (three) times a week.     [provider]  FREESTYLE LITE test strip USE TO TEST BLOOD SUGAR DAILY 03/11/17   Glean Hess,  MD  Inulin (FIBER CHOICE FRUITY BITES) 1.5 g CHEW Chew by mouth.    [provider]  Lancets (FREESTYLE) lancets USE 1 LANCET DAILY 04/22/17   Glean Hess, MD  latanoprost (XALATAN) 0.005 %  ophthalmic solution Place 1 drop into both eyes at bedtime. 10/09/15   [provider]  metFORMIN (GLUCOPHAGE) 500 MG tablet TAKE 1 TABLET DAILY 07/21/17   Glean Hess, MD  MULTIPLE VITAMINS-MINERALS PO Take by mouth.    [provider]  pantoprazole (PROTONIX) 40 MG tablet TAKE 1 TABLET DAILY 02/07/17   Glean Hess, MD  spironolactone (ALDACTONE) 25 MG tablet TAKE 1 TABLET DAILY 07/21/17   Glean Hess, MD  timolol (TIMOPTIC) 0.5 % ophthalmic solution  04/10/15   [provider]    No Known Allergies  Past Surgical History:  Procedure Laterality Date  . CATARACT EXTRACTION    . COLONOSCOPY  2007   sigmoid diverticulosis  . TONSILLECTOMY    . UPPER GASTROINTESTINAL ENDOSCOPY  02/2013   [facility]rett's - done at [facility] History   Tobacco Use  . Smoking status: Never Smoker  . Smokeless tobacco: Never Used  Substance Use Topics  . Alcohol use: No    Alcohol/week: 0.0 oz  . Drug use: No     Medication list has been reviewed and updated.  PHQ 2/9 Scores 07/07/2017 07/04/2016 02/27/2016 06/23/2015  PHQ - 2 Score 0 0 0 0    Physical Exam  Constitutional: She is oriented to person, place, and time. She appears well-developed. No distress.  HENT:  Head: Normocephalic and atraumatic.  Neck: Normal range of motion. No thyromegaly present.  Cardiovascular: Normal rate, regular rhythm and normal heart sounds.  Pulmonary/Chest: Effort normal and breath sounds normal. No respiratory distress. She has no wheezes.  Musculoskeletal: She exhibits no edema or tenderness.  Neurological: She is alert and oriented to person, place, and time.  Skin: Skin is warm and dry. No rash noted.  Psychiatric: She has a normal mood and affect. Her behavior is normal. Thought content normal.  Nursing note and vitals reviewed.   BP (!) 144/82   Pulse 62   Ht 5\' 4"  (1.626 m)   Wt 137 lb 9.6 oz (62.4 kg)   SpO2 100%   BMI 23.62  kg/m   Assessment and Plan: 1. Essential (primary) hypertension controlled - Comprehensive metabolic panel  2. Controlled type 2 diabetes mellitus without complication, without long-term current use of insulin ([facility]) Doing well on oral agents - Comprehensive metabolic panel - Hemoglobin A1c  3. Hyperlipidemia associated with type 2 diabetes mellitus ([place]) On statin therapy - Comprehensive metabolic panel   No orders of the defined types were placed in this encounter.   Partially dictated using Editor, commissioning. Any errors are unintentional.  Halina Maidens, MD [facility]  01/05/2018

## 2018-01-06 LAB — COMPREHENSIVE METABOLIC PANEL
A/G RATIO: 2.1 (ref 1.2–2.2)
ALBUMIN: 4.5 g/dL (ref 3.5–4.8)
ALT: 22 IU/L (ref 0–32)
AST: 25 IU/L (ref 0–40)
Alkaline Phosphatase: 47 IU/L (ref 39–117)
BILIRUBIN TOTAL: 0.4 mg/dL (ref 0.0–1.2)
BUN / CREAT RATIO: 15 (ref 12–28)
BUN: 14 mg/dL (ref 8–27)
CHLORIDE: 98 mmol/L (ref 96–106)
CO2: 23 mmol/L (ref 20–29)
Calcium: 10.1 mg/dL (ref 8.7–10.3)
Creatinine, Ser: 0.93 mg/dL (ref 0.57–1.00)
GFR calc Af Amer: 69 mL/min/{1.73_m2} (ref >59)
GFR calc non Af Amer: 59 mL/min/{1.73_m2} — ABNORMAL LOW (ref >59)
GLOBULIN, TOTAL: 2.1 g/dL (ref 1.5–4.5)
Glucose: 130 mg/dL — ABNORMAL HIGH (ref 65–99)
POTASSIUM: 5 mmol/L (ref 3.5–5.2)
SODIUM: 137 mmol/L (ref 134–144)
Total Protein: 6.6 g/dL (ref 6.0–8.5)

## 2018-01-06 LAB — HEMOGLOBIN A1C
Est. average glucose Bld gHb Est-mCnc: 131 mg/dL
Hgb A1c MFr Bld: 6.2 % — ABNORMAL HIGH (ref 4.8–5.6)

## 2018-02-03 ENCOUNTER — Other Ambulatory Visit: Payer: Self-pay | Admitting: Internal Medicine

## 2018-02-03 DIAGNOSIS — K2270 Barrett's esophagus without dysplasia: Secondary | ICD-10-CM

## 2018-02-03 DIAGNOSIS — D2272 Melanocytic nevi of left lower limb, including hip: Secondary | ICD-10-CM | POA: Diagnosis not present

## 2018-02-03 DIAGNOSIS — D2262 Melanocytic nevi of left upper limb, including shoulder: Secondary | ICD-10-CM | POA: Diagnosis not present

## 2018-02-03 DIAGNOSIS — D2271 Melanocytic nevi of right lower limb, including hip: Secondary | ICD-10-CM | POA: Diagnosis not present

## 2018-02-03 DIAGNOSIS — D225 Melanocytic nevi of trunk: Secondary | ICD-10-CM | POA: Diagnosis not present

## 2018-02-03 DIAGNOSIS — D485 Neoplasm of uncertain behavior of skin: Secondary | ICD-10-CM | POA: Diagnosis not present

## 2018-02-03 DIAGNOSIS — D235 Other benign neoplasm of skin of trunk: Secondary | ICD-10-CM | POA: Diagnosis not present

## 2018-02-03 DIAGNOSIS — D2261 Melanocytic nevi of right upper limb, including shoulder: Secondary | ICD-10-CM | POA: Diagnosis not present

## 2018-02-03 DIAGNOSIS — L821 Other seborrheic keratosis: Secondary | ICD-10-CM | POA: Diagnosis not present

## 2018-03-05 ENCOUNTER — Other Ambulatory Visit: Payer: Self-pay | Admitting: Internal Medicine

## 2018-03-24 DIAGNOSIS — Z961 Presence of intraocular lens: Secondary | ICD-10-CM | POA: Diagnosis not present

## 2018-03-24 DIAGNOSIS — H338 Other retinal detachments: Secondary | ICD-10-CM | POA: Diagnosis not present

## 2018-03-24 DIAGNOSIS — H401121 Primary open-angle glaucoma, left eye, mild stage: Secondary | ICD-10-CM | POA: Diagnosis not present

## 2018-03-24 DIAGNOSIS — E119 Type 2 diabetes mellitus without complications: Secondary | ICD-10-CM | POA: Diagnosis not present

## 2018-03-24 DIAGNOSIS — H401113 Primary open-angle glaucoma, right eye, severe stage: Secondary | ICD-10-CM | POA: Diagnosis not present

## 2018-04-28 ENCOUNTER — Encounter: Payer: Self-pay | Admitting: Internal Medicine

## 2018-04-28 ENCOUNTER — Ambulatory Visit (INDEPENDENT_AMBULATORY_CARE_PROVIDER_SITE_OTHER): Payer: Medicare Other | Admitting: Internal Medicine

## 2018-04-28 VITALS — BP 134/78 | HR 71 | Ht 64.0 in | Wt 132.0 lb

## 2018-04-28 DIAGNOSIS — K5732 Diverticulitis of large intestine without perforation or abscess without bleeding: Secondary | ICD-10-CM

## 2018-04-28 MED ORDER — AMOXICILLIN-POT CLAVULANATE 875-125 MG PO TABS: 1.0000 | ORAL_TABLET | Freq: Two times a day (BID) | ORAL | 0 refills | Status: AC

## 2018-04-28 NOTE — Progress Notes (Signed)
Date:  04/28/2018   Name:  Lori Harrell   DOB:  May 14, 1941   MRN:  [id number]   Chief Complaint: Abdominal Pain (Last endoscopy they were not able to see lower part of stomach because of hernia. Not "painful but just uncomfortable feeling". Last thursday had chills and vomiting. Has not vomited since then. Yesterday went to the bathroom 6 times in the morning. This morning was the same way. ) Abdominal Pain  This is a new problem. The current episode started in the past 7 days. The onset quality is sudden. The problem occurs intermittently. The problem has been unchanged. The pain is located in the suprapubic region and LLQ. The pain is mild. The quality of the pain is cramping. The abdominal pain does not radiate. Associated symptoms include diarrhea (had 6 formed stools yesterday) and vomiting (twice one day last week). Pertinent negatives include no constipation, dysuria or fever. The pain is relieved by nothing. She has tried nothing for the symptoms.  She had one day of chills and vomiting last week then has been fine since.  Yesterday had 5-6 stools, formed, without blood or mucus.  No fever, chills, n/v.  Today has already had 4 stools, again formed but softer.  She has noticed that her blood sugars are running slightly higher than usual. Colonoscopy in 2007 - multiple large and small mouthed diverticuli   Review of Systems  Constitutional: Positive for chills (one day last week). Negative for fatigue and fever.  Respiratory: Negative for chest tightness and shortness of breath.   Cardiovascular: Negative for chest pain and palpitations.  Gastrointestinal: Positive for abdominal pain, diarrhea (had 6 formed stools yesterday) and vomiting (twice one day last week). Negative for blood in stool and constipation.  Genitourinary: Negative for dysuria.    Patient Active Problem List   Diagnosis Date Noted  . Carotid artery stenosis, asymptomatic, bilateral 07/07/2017  . Primary  open angle glaucoma 05/08/2017  . 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 Large hiatal hernia 07/04/2016  . Sigmoid diverticulitis 07/04/2016  . Stress incontinence 07/04/2016  . Controlled type 2 diabetes mellitus without complication, without long-term current use of insulin ([place]) 10/31/2015  . Constipation 10/31/2015  . 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 Essential (primary) hypertension 06/16/2015  . Hyperlipidemia associated with type 2 diabetes mellitus ([place]) 06/16/2015  . OP (osteoporosis) 06/16/2015  . Hypercalcemia 04/22/2012    Prior to Admission medications   Medication Sig Start Date End Date Taking? Authorizing Provider  Acetaminophen-Caffeine (EXCEDRIN TENSION HEADACHE) 500-65 MG TABS Take by mouth.   Yes [provider]  aspirin 81 MG tablet Take by mouth.   Yes [provider]  atorvastatin (LIPITOR) 20 MG tablet TAKE 1 TABLET DAILY 07/21/17  Yes Glean Hess, MD  enalapril (VASOTEC) 20 MG tablet TAKE 1 TABLET DAILY 02/03/18  Yes Glean Hess, MD  ferrous sulfate (IRON SUPPLEMENT) 325 (65 FE) MG tablet Take 1 tablet by mouth 3 (three) times a week.    Yes [provider]  FREESTYLE LITE test strip USE TO TEST BLOOD SUGAR DAILY 03/05/18  Yes Glean Hess, MD  Inulin (FIBER CHOICE FRUITY BITES) 1.5 g CHEW Chew by mouth.   Yes [provider]  Lancets (FREESTYLE) lancets USE 1 LANCET DAILY 04/22/17  Yes Glean Hess, MD  latanoprost (XALATAN) 0.005 % ophthalmic solution Place 1 drop into both eyes at bedtime. 10/09/15  Yes [provider]  metFORMIN (GLUCOPHAGE) 500 MG tablet TAKE 1  TABLET DAILY 07/21/17  Yes Glean Hess, MD  MULTIPLE VITAMINS-MINERALS PO Take by mouth.   Yes [provider]  pantoprazole (PROTONIX) 40 MG tablet TAKE 1 TABLET DAILY 02/03/18  Yes Glean Hess, MD  spironolactone (ALDACTONE) 25 MG tablet TAKE 1 TABLET DAILY 07/21/17  Yes Glean Hess, MD  timolol (TIMOPTIC) 0.5 %  ophthalmic solution  04/10/15  Yes [provider]    No Known Allergies  Past Surgical History:  Procedure Laterality Date  . CATARACT EXTRACTION    . COLONOSCOPY  2007   sigmoid diverticulosis  . TONSILLECTOMY    . UPPER GASTROINTESTINAL ENDOSCOPY  02/2013   Barrett's - done at [facility] History   Tobacco Use  . Smoking status: Never Smoker  . Smokeless tobacco: Never Used  Substance Use Topics  . Alcohol use: No    Alcohol/week: 0.0 oz  . Drug use: No     Medication list has been reviewed and updated.  Current Meds  Medication Sig  . Acetaminophen-Caffeine (EXCEDRIN TENSION HEADACHE) 500-65 MG TABS Take by mouth.  Marland Kitchen aspirin 81 MG tablet Take by mouth.  Marland Kitchen atorvastatin (LIPITOR) 20 MG tablet TAKE 1 TABLET DAILY  . enalapril (VASOTEC) 20 MG tablet TAKE 1 TABLET DAILY  . ferrous sulfate (IRON SUPPLEMENT) 325 (65 FE) MG tablet Take 1 tablet by mouth 3 (three) times a week.   Marland Kitchen FREESTYLE LITE test strip USE TO TEST BLOOD SUGAR DAILY  . Inulin (FIBER CHOICE FRUITY BITES) 1.5 g CHEW Chew by mouth.  . Lancets (FREESTYLE) lancets USE 1 LANCET DAILY  . latanoprost (XALATAN) 0.005 % ophthalmic solution Place 1 drop into both eyes at bedtime.  . metFORMIN (GLUCOPHAGE) 500 MG tablet TAKE 1 TABLET DAILY  . MULTIPLE VITAMINS-MINERALS PO Take by mouth.  . pantoprazole (PROTONIX) 40 MG tablet TAKE 1 TABLET DAILY  . spironolactone (ALDACTONE) 25 MG tablet TAKE 1 TABLET DAILY  . timolol (TIMOPTIC) 0.5 % ophthalmic solution     PHQ 2/9 Scores 07/07/2017 07/04/2016 02/27/2016 06/23/2015  PHQ - 2 Score 0 0 0 0    Physical Exam  Constitutional: She is oriented to person, place, and time. She appears well-developed. No distress.  HENT:  Head: Normocephalic and atraumatic.  Cardiovascular: Normal rate and regular rhythm.  Pulmonary/Chest: Effort normal. No respiratory distress.  Abdominal: Soft. Normal appearance. Bowel sounds are increased.  There is no hepatosplenomegaly. There is tenderness in the left lower quadrant. There is no rigidity and no guarding.  Musculoskeletal: Normal range of motion.  Neurological: She is alert and oriented to person, place, and time.  Skin: Skin is warm and dry. No rash noted.  Psychiatric: She has a normal mood and affect. Her behavior is normal. Thought content normal.  Nursing note and vitals reviewed.   BP 134/78   Pulse 71   Ht 5\' 4"  (1.626 m)   Wt 132 lb (59.9 kg)   SpO2 100%   BMI 22.66 kg/m   Assessment and Plan: 1. Diverticulitis of large intestine without perforation or abscess without bleeding Suspect mild diverticulitis Bland diet, fluids, probiotics Follow up or go to ED if worsening - amoxicillin-clavulanate (AUGMENTIN) 875-125 MG tablet; Take 1 tablet by mouth 2 (two) times daily for 10 days.  Dispense: 20 tablet; Refill: 0   Meds ordered this encounter  Medications  . amoxicillin-clavulanate (AUGMENTIN) 875-125 MG tablet    Sig: Take 1 tablet by mouth 2 (two)  times daily for 10 days.    Dispense:  20 tablet    Refill:  0    Partially dictated using Editor, commissioning. Any errors are unintentional.  Halina Maidens, MD [facility]  04/28/2018

## 2018-04-28 NOTE — Patient Instructions (Signed)
Diverticulitis °Diverticulitis is infection or inflammation of small pouches (diverticula) in the colon that form due to a condition called diverticulosis. Diverticula can trap stool (feces) and bacteria, causing infection and inflammation. °Diverticulitis may cause severe stomach pain and diarrhea. It may lead to tissue damage in the colon that causes bleeding. The diverticula may also burst (rupture) and cause infected stool to enter other areas of the abdomen. °Complications of diverticulitis can include: °· Bleeding. °· Severe infection. °· Severe pain. °· Rupture (perforation) of the colon. °· Blockage (obstruction) of the colon. ° °What are the causes? °This condition is caused by stool becoming trapped in the diverticula, which allows bacteria to grow in the diverticula. This leads to inflammation and infection. °What increases the risk? °You are more likely to develop this condition if: °· You have diverticulosis. The risk for diverticulosis increases if: °? You are overweight or obese. °? You use tobacco products. °? You do not get enough exercise. °· You eat a diet that does not include enough fiber. High-fiber foods include fruits, vegetables, beans, nuts, and whole grains. ° °What are the signs or symptoms? °Symptoms of this condition may include: °· Pain and tenderness in the abdomen. The pain is normally located on the left side of the abdomen, but it may occur in other areas. °· Fever and chills. °· Bloating. °· Cramping. °· Nausea. °· Vomiting. °· Changes in bowel routines. °· Blood in your stool. ° °How is this diagnosed? °This condition is diagnosed based on: °· Your medical history. °· A physical exam. °· Tests to make sure there is nothing else causing your condition. These tests may include: °? Blood tests. °? Urine tests. °? Imaging tests of the abdomen, including X-rays, ultrasounds, MRIs, or CT scans. ° °How is this treated? °Most cases of this condition are mild and can be treated at home.  Treatment may include: °· Taking over-the-counter pain medicines. °· Following a clear liquid diet. °· Taking antibiotic medicines by mouth. °· Rest. ° °More severe cases may need to be treated at a hospital. Treatment may include: °· Not eating or drinking. °· Taking prescription pain medicine. °· Receiving antibiotic medicines through an IV tube. °· Receiving fluids and nutrition through an IV tube. °· Surgery. ° °When your condition is under control, your health care provider may recommend that you have a colonoscopy. This is an exam to look at the entire large intestine. During the exam, a lubricated, bendable tube is inserted into the anus and then passed into the rectum, colon, and other parts of the large intestine. A colonoscopy can show how severe your diverticula are and whether something else may be causing your symptoms. °Follow these instructions at home: °Medicines °· Take over-the-counter and prescription medicines only as told by your health care provider. These include fiber supplements, probiotics, and stool softeners. °· If you were prescribed an antibiotic medicine, take it as told by your health care provider. Do not stop taking the antibiotic even if you start to feel better. °· Do not drive or use heavy machinery while taking prescription pain medicine. °General instructions °· Follow a full liquid diet or another diet as directed by your health care provider. After your symptoms improve, your health care provider may tell you to change your diet. He or she may recommend that you eat a diet that contains at least 25 g (25 grams) of fiber daily. Fiber makes it easier to pass stool. Healthy sources of fiber include: °? Berries. One cup   contains 4-8 grams of fiber. °? Beans or lentils. One half cup contains 5-8 grams of fiber. °? Green vegetables. One cup contains 4 grams of fiber. °· Exercise for at least 30 minutes, 3 times each week. You should exercise hard enough to raise your heart rate and  break a sweat. °· Keep all follow-up visits as told by your health care provider. This is important. You may need a colonoscopy. °Contact a health care provider if: °· Your pain does not improve. °· You have a hard time drinking or eating food. °· Your bowel movements do not return to normal. °Get help right away if: °· Your pain gets worse. °· Your symptoms do not get better with treatment. °· Your symptoms suddenly get worse. °· You have a fever. °· You vomit more than one time. °· You have stools that are bloody, black, or tarry. °Summary °· Diverticulitis is infection or inflammation of small pouches (diverticula) in the colon that form due to a condition called diverticulosis. Diverticula can trap stool (feces) and bacteria, causing infection and inflammation. °· You are at higher risk for this condition if you have diverticulosis and you eat a diet that does not include enough fiber. °· Most cases of this condition are mild and can be treated at home. More severe cases may need to be treated at a hospital. °· When your condition is under control, your health care provider may recommend that you have an exam called a colonoscopy. This exam can show how severe your diverticula are and whether something else may be causing your symptoms. °This information is not intended to replace advice given to you by your health care provider. Make sure you discuss any questions you have with your health care provider. °Document Released: 06/26/2005 Document Revised: 10/19/2016 Document Reviewed: 10/19/2016 °[employer]vier Interactive Patient Education © 2018 [employer] ° °

## 2018-07-08 ENCOUNTER — Ambulatory Visit (INDEPENDENT_AMBULATORY_CARE_PROVIDER_SITE_OTHER): Payer: Medicare Other

## 2018-07-08 VITALS — BP 132/78 | HR 70 | Temp 97.5°F | Resp 12 | Ht 64.0 in | Wt 132.4 lb

## 2018-07-08 DIAGNOSIS — E2839 Other primary ovarian failure: Secondary | ICD-10-CM

## 2018-07-08 DIAGNOSIS — Z0000 Encounter for general adult medical examination without abnormal findings: Secondary | ICD-10-CM | POA: Diagnosis not present

## 2018-07-08 DIAGNOSIS — Z23 Encounter for immunization: Secondary | ICD-10-CM

## 2018-07-08 NOTE — Patient Instructions (Signed)
[name] , Thank you for taking time to come for your Medicare Wellness Visit. I appreciate your ongoing commitment to your health goals. Please review the following plan we discussed and let me know if I can assist you in the future.   Screening recommendations/referrals: Colorectal Screening: No longer required Mammogram: Up to date Bone Density: Please call to schedule your appointment  Vision and Dental Exams: Recommended annual ophthalmology exams for early detection of glaucoma and other disorders of the eye Recommended annual dental exams for proper oral hygiene  Diabetic Exams: Diabetic Eye Exam: Up to date Diabetic Foot Exam: To be completed tomorrow  Vaccinations: Influenza vaccine: Completed today Pneumococcal vaccine: Up to date Tdap vaccine: Up to date Shingles vaccine: Please call your insurance company to determine your out of pocket expense for the Shingrix vaccine. You may receive this vaccine at your local pharmacy.  Advanced directives: We have received a copy of your POA (Power of [place]) and/or Living Will. These documents can be located in your chart.  Goals: Recommend to eat 3 small healthy meals and at least 2 healthy snacks per day  Next appointment: Please schedule your Annual Wellness Visit with your Nurse Health Advisor in one year.  Preventive Care 67 Years and Older, Female Preventive care refers to lifestyle choices and visits with your health care provider that can promote health and wellness. What does preventive care include?  A yearly physical exam. This is also called an annual well check.  Dental exams once or twice a year.  Routine eye exams. Ask your health care provider how often you should have your eyes checked.  Personal lifestyle choices, including:  Daily care of your teeth and gums.  Regular physical activity.  Eating a healthy diet.  Avoiding tobacco and drug use.  Limiting alcohol use.  Practicing safe  sex.  Taking low-dose aspirin every day if recommended by your health care provider.  Taking vitamin and mineral supplements as recommended by your health care provider. What happens during an annual well check? The services and screenings done by your health care provider during your annual well check will depend on your age, overall health, lifestyle risk factors, and family history of disease. Counseling  Your health care provider may ask you questions about your:  Alcohol use.  Tobacco use.  Drug use.  Emotional well-being.  Home and relationship well-being.  Sexual activity.  Eating habits.  History of falls.  Memory and ability to understand (cognition).  Work and work Statistician.  Reproductive health. Screening  You may have the following tests or measurements:  Height, weight, and BMI.  Blood pressure.  Lipid and cholesterol levels. These may be checked every 5 years, or more frequently if you are over 89 years old.  Skin check.  Lung cancer screening. You may have this screening every year starting at age 48 if you have a 30-pack-year history of smoking and currently smoke or have quit within the past 15 years.  Fecal occult blood test (FOBT) of the stool. You may have this test every year starting at age 63.  Flexible sigmoidoscopy or colonoscopy. You may have a sigmoidoscopy every 5 years or a colonoscopy every 10 years starting at age 51.  Hepatitis C blood test.  Hepatitis B blood test.  Sexually transmitted disease (STD) testing.  Diabetes screening. This is done by checking your blood sugar (glucose) after you have not eaten for a while (fasting). You may have this done every 1-3 years.  Bone density scan. This is done to screen for osteoporosis. You may have this done starting at age 57.  Mammogram. This may be done every 1-2 years. Talk to your health care provider about how often you should have regular mammograms. Talk with your health  care provider about your test results, treatment options, and if necessary, the need for more tests. Vaccines  Your health care provider may recommend certain vaccines, such as:  Influenza vaccine. This is recommended every year.  Tetanus, diphtheria, and acellular pertussis (Tdap, Td) vaccine. You may need a Td booster every 10 years.  Zoster vaccine. You may need this after age 97.  Pneumococcal 13-valent conjugate (PCV13) vaccine. One dose is recommended after age 33.  Pneumococcal polysaccharide (PPSV23) vaccine. One dose is recommended after age 55. Talk to your health care provider about which screenings and vaccines you need and how often you need them. This information is not intended to replace advice given to you by your health care provider. Make sure you discuss any questions you have with your health care provider. Document Released: 10/13/2015 Document Revised: 06/05/2016 Document Reviewed: 07/18/2015 Elsevier Interactive Patient Education  2017 [place] Prevention in the Home Falls can cause injuries. They can happen to people of all ages. There are many things you can do to make your home safe and to help prevent falls. What can I do on the outside of my home?  Regularly fix the edges of walkways and driveways and fix any cracks.  Remove anything that might make you trip as you walk through a door, such as a raised step or threshold.  Trim any bushes or trees on the path to your home.  Use bright outdoor lighting.  Clear any walking paths of anything that might make someone trip, such as rocks or tools.  Regularly check to see if handrails are loose or broken. Make sure that both sides of any steps have handrails.  Any raised decks and porches should have guardrails on the edges.  Have any leaves, snow, or ice cleared regularly.  Use sand or salt on walking paths during winter.  Clean up any spills in your garage right away. This includes oil or  grease spills. What can I do in the bathroom?  Use night lights.  Install grab bars by the toilet and in the tub and shower. Do not use towel bars as grab bars.  Use non-skid mats or decals in the tub or shower.  If you need to sit down in the shower, use a plastic, non-slip stool.  Keep the floor dry. Clean up any water that spills on the floor as soon as it happens.  Remove soap buildup in the tub or shower regularly.  Attach bath mats securely with double-sided non-slip rug tape.  Do not have throw rugs and other things on the floor that can make you trip. What can I do in the bedroom?  Use night lights.  Make sure that you have a light by your bed that is easy to reach.  Do not use any sheets or blankets that are too big for your bed. They should not hang down onto the floor.  Have a firm chair that has side arms. You can use this for support while you get dressed.  Do not have throw rugs and other things on the floor that can make you trip. What can I do in the kitchen?  Clean up any spills right away.  Avoid walking  on wet floors.  Keep items that you use a lot in easy-to-reach places.  If you need to reach something above you, use a strong step stool that has a grab bar.  Keep electrical cords out of the way.  Do not use floor polish or wax that makes floors slippery. If you must use wax, use non-skid floor wax.  Do not have throw rugs and other things on the floor that can make you trip. What can I do with my stairs?  Do not leave any items on the stairs.  Make sure that there are handrails on both sides of the stairs and use them. Fix handrails that are broken or loose. Make sure that handrails are as long as the stairways.  Check any carpeting to make sure that it is firmly attached to the stairs. Fix any carpet that is loose or worn.  Avoid having throw rugs at the top or bottom of the stairs. If you do have throw rugs, attach them to the floor with  carpet tape.  Make sure that you have a light switch at the top of the stairs and the bottom of the stairs. If you do not have them, ask someone to add them for you. What else can I do to help prevent falls?  Wear shoes that:  Do not have high heels.  Have rubber bottoms.  Are comfortable and fit you well.  Are closed at the toe. Do not wear sandals.  If you use a stepladder:  Make sure that it is fully opened. Do not climb a closed stepladder.  Make sure that both sides of the stepladder are locked into place.  Ask someone to hold it for you, if possible.  Clearly mark and make sure that you can see:  Any grab bars or handrails.  First and last steps.  Where the edge of each step is.  Use tools that help you move around (mobility aids) if they are needed. These include:  Canes.  Walkers.  Scooters.  Crutches.  Turn on the lights when you go into a dark area. Replace any light bulbs as soon as they burn out.  Set up your furniture so you have a clear path. Avoid moving your furniture around.  If any of your floors are uneven, fix them.  If there are any pets around you, be aware of where they are.  Review your medicines with your doctor. Some medicines can make you feel dizzy. This can increase your chance of falling. Ask your doctor what other things that you can do to help prevent falls. This information is not intended to replace advice given to you by your health care provider. Make sure you discuss any questions you have with your health care provider. Document Released: 07/13/2009 Document Revised: 02/22/2016 Document Reviewed: 10/21/2014 Elsevier Interactive Patient Education  2017 [employer].

## 2018-07-08 NOTE — Progress Notes (Signed)
Subjective:   Lori Harrell is a 77 y.o. female who presents for Medicare Annual (Subsequent) preventive examination.  Review of Systems:  N/A Cardiac Risk Factors include: advanced age (>12men, >48 women);diabetes mellitus;dyslipidemia;hypertension;sedentary lifestyle     Objective:     Vitals: BP 132/78 (BP Location: Right Arm, Patient Position: Sitting, Cuff Size: Normal)   Pulse 70   Temp (!) 97.5 F (36.4 C) (Oral)   Resp 12   Ht 5\' 4"  (1.626 m)   Wt 132 lb 6.4 oz (60.1 kg)   SpO2 96%   BMI 22.73 kg/m   Body mass index is 22.73 kg/m.  Advanced Directives 07/08/2018 07/07/2017 02/26/2017 07/04/2016 10/31/2015 06/23/2015  Does Patient Have a Medical Advance Directive? Yes Yes No Yes Yes No  Type of Paramedic of [place];Living will [place];Living will - - Living will -  Copy of [place] in Chart? Yes Yes - - Yes -  Would patient like information on creating a medical advance directive? - - - - - Yes - Scientist, clinical (histocompatibility and immunogenetics) given    Tobacco Social History   Tobacco Use  Smoking Status Never Smoker  Smokeless Tobacco Never Used  Tobacco Comment   smoking cessation materials not required     Counseling given: No Comment: smoking cessation materials not required  Clinical Intake:  Pre-visit preparation completed: Yes  Pain : No/denies pain   BMI - recorded: 22.73 Nutritional Status: BMI of 19-24  Normal Nutritional Risks: Nausea/ vomitting/ diarrhea(States she had coffee ground appearing emesis last week but nothing since then. Denies any abdominal pain or discomfort, fatigue or discolored stool. Abdomen soft and non-tender. BS x4 and hyperactive. Pt scheduled for further evaluation with Dr. Army Melia on 07/09/18)  Nutrition Risk Assessment:  Does the patient have any non-healing wounds?  No  Has the patient had any unintentional weight loss or weight gain?  No   Diabetes:  Is the patient  diabetic?  Yes  If diabetic, was a CBG obtained today?  No  Did the patient bring in their glucometer from home?  No  How often do you monitor your CBG's? daily.   Financial Strains and Diabetes Management:  Are you having any financial strains with the device, your supplies or your medication? No .  Does the patient want to be seen by Chronic Care Management for management of their diabetes?  No  Would the patient like to be referred to a Nutritionist or for Diabetic Management?  No   Diabetic Exams:  Diabetic Eye Exam: Completed 10/01/17.   Diabetic Foot Exam: Completed 108/18. Pt has been advised about the importance in completing this exam. Pt is scheduled for diabetic foot exam on 07/09/18.  How often do you need to have someone help you when you read instructions, pamphlets, or other written materials from your doctor or pharmacy?: 1 - Never  Interpreter Needed?: No  Information entered by :: Idell Pickles, LPN  Past Medical History:  Diagnosis Date  . Acid reflux   . 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 Diabetes mellitus without complication ([place])   . Hypertension    Past Surgical History:  Procedure Laterality Date  . CATARACT EXTRACTION    . COLONOSCOPY  2007   sigmoid diverticulosis  . TONSILLECTOMY    . UPPER GASTROINTESTINAL ENDOSCOPY  02/2013   [facility]'s - done at [facility]  . VAGINAL HYSTERECTOMY     Family History  Problem Relation Age of  Onset  . Diabetes Father   . CAD Father   . Breast cancer Neg Hx    Social History   Socioeconomic History  . Marital status: Widowed    Spouse name: Not on file  . Number of children: 2  . Years of education: Not on file  . Highest education level: 12th grade  Occupational History  . Occupation: Retired  Scientific laboratory technician  . Financial resource strain: Not hard at all  . Food insecurity:    Worry: Never true    Inability: Never true  . Transportation needs:    Medical: No    Non-medical: No  Tobacco Use   . Smoking status: Never Smoker  . Smokeless tobacco: Never Used  . Tobacco comment: smoking cessation materials not required  Substance and Sexual Activity  . Alcohol use: No    Alcohol/week: 0.0 standard drinks  . Drug use: No  . Sexual activity: Not Currently  Lifestyle  . Physical activity:    Days per week: 3 days    Minutes per session: 40 min  . Stress: Not at all  Relationships  . Social connections:    Talks on phone: Patient refused    Gets together: Patient refused    Attends religious service: Patient refused    Active member of club or organization: Patient refused    Attends meetings of clubs or organizations: Patient refused    Relationship status: Widowed  Other Topics Concern  . Not on file  Social History Narrative  . Not on file    Outpatient Encounter Medications as of 07/08/2018  Medication Sig  . Acetaminophen-Caffeine (EXCEDRIN TENSION HEADACHE) 500-65 MG TABS Take by mouth.  Marland Kitchen aspirin 81 MG tablet Take by mouth.  Marland Kitchen atorvastatin (LIPITOR) 20 MG tablet TAKE 1 TABLET DAILY  . enalapril (VASOTEC) 20 MG tablet TAKE 1 TABLET DAILY  . ferrous sulfate (IRON SUPPLEMENT) 325 (65 FE) MG tablet Take 1 tablet by mouth 3 (three) times a week.   Marland Kitchen FREESTYLE LITE test strip USE TO TEST BLOOD SUGAR DAILY  . Inulin (FIBER CHOICE FRUITY BITES) 1.5 g CHEW Chew by mouth.  . Lancets (FREESTYLE) lancets USE 1 LANCET DAILY  . latanoprost (XALATAN) 0.005 % ophthalmic solution Place 1 drop into both eyes at bedtime.  . metFORMIN (GLUCOPHAGE) 500 MG tablet TAKE 1 TABLET DAILY  . MULTIPLE VITAMINS-MINERALS PO Take by mouth.  . pantoprazole (PROTONIX) 40 MG tablet TAKE 1 TABLET DAILY  . spironolactone (ALDACTONE) 25 MG tablet TAKE 1 TABLET DAILY  . timolol (TIMOPTIC) 0.5 % ophthalmic solution    No facility-administered encounter medications on file as of 07/08/2018.     Activities of Daily Living In your present state of health, do you have any difficulty performing the  following activities: 07/08/2018  Hearing? N  Comment denies hearing aids  Vision? N  Comment wears eyeglasses, glaucoma  Difficulty concentrating or making decisions? N  Walking or climbing stairs? N  Dressing or bathing? N  Doing errands, shopping? N  Preparing Food and eating ? N  Comment denies dentures  Using the Toilet? N  In the past six months, have you accidently leaked urine? N  Do you have problems with loss of bowel control? N  Managing your Medications? N  Managing your Finances? N  Housekeeping or managing your Housekeeping? N  Some recent data might be hidden    Patient Care Team: Glean Hess, MD as PCP - General (Internal Medicine) Florinda Marker Early Chars, MD  as Consulting Physician (Ophthalmology) Malissa Hippo, Gaspar Skeeters, MD as Consulting Physician (Gastroenterology)    Assessment:   This is a routine wellness examination for [place].  Exercise Activities and Dietary recommendations Current Exercise Habits: Home exercise routine, Type of exercise: walking, Time (Minutes): 40, Frequency (Times/Week): 3, Weekly Exercise (Minutes/Week): 120, Intensity: Mild, Exercise limited by: None identified  Goals    . continue activity     Taking a class in [place] in [place] with church. She also walks 2-3 days a week 30 min and helps at church every Wed night.    Marland Kitchen DIET - EAT MORE FRUITS AND VEGETABLES     Recommend to eat 3 small healthy meals and at least 2 healthy snacks per day.    . Increase water intake     Recommend to increase fluid intake from 3-4 glasses per day to 4-6 glasses per day.       Fall Risk Fall Risk  07/08/2018 07/07/2017 07/04/2016 02/27/2016 06/23/2015  Falls in the past year? No No No No No  Risk for fall due to : Impaired vision;History of fall(s) Impaired vision - - -  Risk for fall due to: Comment wears eyeglasses, glaucoma in need of updated eye exam. Scheduled to be seen in Dec 2018 - - -   [place]:  Any stairs in or around the home WITH handrails? No stairs. Lives in one level home Home free of loose throw rugs in walkways, pet beds, electrical cords, etc? Yes  Adequate lighting in your home to reduce risk of falls? Yes   ASSISTIVE DEVICES UTILIZED TO PREVENT FALLS:  Life alert? Yes  Use of a cane, walker or w/c? Yes  Grab bars in the bathroom? No  Shower chair or bench in shower? Yes  Elevated toilet seat or a handicapped toilet? Yes   DME ORDERS:  DME order needed?  No   TIMED UP AND GO:  Was the test performed? Yes .  Length of time to ambulate 10 feet: 12 sec.   GAIT:  Appearance of gait: Gait stead-fast and without the use of an assistive device.  Education: Fall risk prevention has been discussed.  Intervention(s) required? No   Depression Screen PHQ 2/9 Scores 07/08/2018 07/07/2017 07/04/2016 02/27/2016  PHQ - 2 Score 0 0 0 0  PHQ- 9 Score 0 - - -     Cognitive Function     6CIT Screen 07/08/2018 07/07/2017 07/04/2016  What Year? 0 points 0 points 0 points  What month? 0 points 0 points 0 points  What time? 0 points 0 points 0 points  Count back from 20 0 points 0 points 0 points  Months in reverse 0 points 0 points 0 points  Repeat phrase 0 points 0 points 0 points  Total Score 0 0 0    Immunization History  Administered Date(s) Administered  . Influenza, High Dose Seasonal PF 07/07/2017, 07/08/2018  . Influenza, Seasonal, Injecte, Preservative Fre 08/19/2013  . Influenza,inj,Quad PF,6+ Mos 06/23/2015, 07/04/2016  . Influenza-Unspecified 06/18/2011, 08/21/2012  . Pneumococcal Conjugate-13 06/20/2014  . Pneumococcal Polysaccharide-23 10/02/2007  . Tdap 10/01/2010    Qualifies for Shingles Vaccine? Yes . Due for Shingrix. Education has been provided regarding the importance of this vaccine. Pt has been advised to call insurance company to determine out of pocket expense. Advised may also receive vaccine at local pharmacy or Health Dept. Verbalized  acceptance and understanding.  Flu Vaccine: Due for Flu vaccine. Does the  patient want to receive this vaccine today?  Yes .   Screening Tests Health Maintenance  Topic Date Due  . FOOT EXAM  07/07/2018  . HEMOGLOBIN A1C  07/07/2018  . OPHTHALMOLOGY EXAM  10/01/2018  . MAMMOGRAM  01/06/2019  . TETANUS/TDAP  10/01/2020  . INFLUENZA VACCINE  Completed  . PNA vac Low Risk Adult  Completed  . DEXA SCAN  Addressed    Cancer Screenings:  Colorectal Screening: No longer required.   Mammogram: Completed 01/05/18. Repeat every year  Bone Density: Completed 01/28/14. Results reflect OSTEOPENIA/OSTEOPOROSIS. Repeat every 2 years. Ordered today. Pt provided with contact info and advised to call to schedule appt.   Lung Cancer Screening: (Low Dose CT Chest recommended if Age 86-80 years, 30 pack-year currently smoking OR have quit w/in 15years.) does not qualify.   Additional Screening:  Hepatitis C Screening: does not qualify  Dental Screening: Recommended annual dental exams for proper oral hygiene  Community Resource Referral:  CRR required this visit?  No     Plan:  I have personally reviewed and addressed the Medicare Annual Wellness questionnaire and have noted the following in the patient's chart:  A. Medical and social history B. Use of alcohol, tobacco or illicit drugs  C. Current medications and supplements D. Functional ability and status E.  Nutritional status F.  Physical activity G. Advance directives H. List of other physicians I.  Hospitalizations, surgeries, and ER visits in previous 12 months J.  [place] such as hearing and vision if needed, cognitive and depression L. Referrals and appointments  In addition, I have reviewed and discussed with patient certain preventive protocols, quality metrics, and best practice recommendations. A written personalized care plan for preventive services as well as general preventive health recommendations were  provided to patient.  Signed,  Aleatha Borer, LPN Nurse Health Advisor  MD Recommendations: Bone Density: Completed 01/28/14. Results reflect OSTEOPENIA/OSTEOPOROSIS. Repeat every 2 years. Ordered today. Pt provided with contact info and advised to call to schedule appt.   Nausea/ vomitting/ diarrhea: States she had coffee ground appearing emesis last week but nothing since then. Denies any abdominal pain or discomfort, fatigue or discolored stool. Abdomen soft and non-tender. BS x4 and hyperactive. Pt is scheduled for f/u with Dr. Army Melia on 07/09/18. Pt states she plans to further discuss during her appt with Dr. Army Melia

## 2018-07-09 ENCOUNTER — Ambulatory Visit (INDEPENDENT_AMBULATORY_CARE_PROVIDER_SITE_OTHER): Payer: Medicare Other | Admitting: Internal Medicine

## 2018-07-09 ENCOUNTER — Encounter: Payer: Self-pay | Admitting: Internal Medicine

## 2018-07-09 VITALS — BP 130/82 | HR 69 | Ht 64.0 in | Wt 132.0 lb

## 2018-07-09 DIAGNOSIS — K2270 Barrett's esophagus without dysplasia: Secondary | ICD-10-CM | POA: Diagnosis not present

## 2018-07-09 DIAGNOSIS — E1169 Type 2 diabetes mellitus with other specified complication: Secondary | ICD-10-CM

## 2018-07-09 DIAGNOSIS — E785 Hyperlipidemia, unspecified: Secondary | ICD-10-CM | POA: Diagnosis not present

## 2018-07-09 DIAGNOSIS — E119 Type 2 diabetes mellitus without complications: Secondary | ICD-10-CM

## 2018-07-09 DIAGNOSIS — I10 Essential (primary) hypertension: Secondary | ICD-10-CM | POA: Diagnosis not present

## 2018-07-09 DIAGNOSIS — R1013 Epigastric pain: Secondary | ICD-10-CM | POA: Diagnosis not present

## 2018-07-09 LAB — POCT URINALYSIS DIPSTICK
BILIRUBIN UA: NEGATIVE
GLUCOSE UA: NEGATIVE
KETONES UA: NEGATIVE
Nitrite, UA: NEGATIVE
PH UA: 6 (ref 5.0–8.0)
PROTEIN UA: NEGATIVE
Spec Grav, UA: 1.02 (ref 1.010–1.025)
Urobilinogen, UA: 0.2 E.U./dL

## 2018-07-09 NOTE — Progress Notes (Signed)
Date:  07/09/2018   Name:  Lori Harrell   DOB:  [birth year]   MRN:  [id number]   Chief Complaint: Annual Exam (Breast Exam. Just had MAW yesterday. ) Pt reports doing well. No breast complaints.  She continues to have yearly mammograms, last done in April.   Hypertension  This is a chronic problem. The problem is controlled. Pertinent negatives include no chest pain, headaches, palpitations or shortness of breath.  Diabetes  She presents for her follow-up diabetic visit. She has type 2 diabetes mellitus. Pertinent negatives for hypoglycemia include no dizziness, headaches, nervousness/anxiousness or tremors. Pertinent negatives for diabetes include no chest pain, no fatigue, no polydipsia and no polyuria. She monitors blood glucose at home 3-4 x per week. Her breakfast blood glucose is taken between 6-7 am. Her breakfast blood glucose range is generally 110-130 mg/dl.  Hyperlipidemia  Pertinent negatives include no chest pain or shortness of breath.  Abdominal Pain  This is a recurrent problem. The problem has been waxing and waning. The pain is located in the LLQ and RLQ. Associated symptoms include nausea and vomiting (one episode last week of dark material). Pertinent negatives include no arthralgias, constipation, diarrhea, dysuria, fever, frequency or headaches. She has tried proton pump inhibitors for the symptoms.  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  She has no dysphagia but feels nauseated if she over eats.  Lab Results  Component Value Date   HGBA1C 6.2 (H) 01/05/2018    Review of Systems  Constitutional: Negative for chills, fatigue and fever.  HENT: Negative for congestion, hearing loss, tinnitus, trouble swallowing and voice change.   Eyes: Negative for visual disturbance.  Respiratory: Negative for cough, chest tightness, shortness of breath and wheezing.   Cardiovascular: Negative for chest pain, palpitations and leg swelling.   Gastrointestinal: Positive for abdominal pain, nausea and vomiting (one episode last week of dark material). Negative for blood in stool, constipation and diarrhea.  Endocrine: Negative for polydipsia and polyuria.  Genitourinary: Negative for dysuria, frequency, genital sores, vaginal bleeding and vaginal discharge.  Musculoskeletal: Negative for arthralgias, gait problem and joint swelling.  Skin: Negative for color change and rash.  Neurological: Negative for dizziness, tremors, light-headedness and headaches.  Hematological: Negative for adenopathy. Does not bruise/bleed easily.  Psychiatric/Behavioral: Negative for dysphoric mood and sleep disturbance. The patient is not nervous/anxious.     Patient Active Problem List   Diagnosis Date Noted  . Carotid artery stenosis, asymptomatic, bilateral 07/07/2017  . Primary open angle glaucoma 05/08/2017  . 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 Large hiatal hernia 07/04/2016  . Sigmoid diverticulitis 07/04/2016  . Stress incontinence 07/04/2016  . Controlled type 2 diabetes mellitus without complication, without long-term current use of insulin ([place]) 10/31/2015  . Constipation 10/31/2015  . 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 Essential (primary) hypertension 06/16/2015  . Hyperlipidemia associated with type 2 diabetes mellitus ([place]) 06/16/2015  . OP (osteoporosis) 06/16/2015  . Hypercalcemia 04/22/2012    No Known Allergies  Past Surgical History:  Procedure Laterality Date  . CATARACT EXTRACTION    . COLONOSCOPY  2007   sigmoid diverticulosis  . TONSILLECTOMY    . UPPER GASTROINTESTINAL ENDOSCOPY  02/2013   Barrett's - done at [place] History   Tobacco Use  . Smoking status: Never Smoker  . Smokeless tobacco: Never Used  . Tobacco comment: smoking cessation materials not required  Substance  Use Topics  . Alcohol use: No    Alcohol/week: 0.0 standard drinks  . Drug use: No      Medication list has been reviewed and updated.  Current Meds  Medication Sig  . Acetaminophen-Caffeine (EXCEDRIN TENSION HEADACHE) 500-65 MG TABS Take by mouth.  Marland Kitchen aspirin 81 MG tablet Take by mouth.  Marland Kitchen atorvastatin (LIPITOR) 20 MG tablet TAKE 1 TABLET DAILY  . enalapril (VASOTEC) 20 MG tablet TAKE 1 TABLET DAILY  . ferrous sulfate (IRON SUPPLEMENT) 325 (65 FE) MG tablet Take 1 tablet by mouth 3 (three) times a week.   Marland Kitchen FREESTYLE LITE test strip USE TO TEST BLOOD SUGAR DAILY  . Inulin (FIBER CHOICE FRUITY BITES) 1.5 g CHEW Chew by mouth.  . Lancets (FREESTYLE) lancets USE 1 LANCET DAILY  . latanoprost (XALATAN) 0.005 % ophthalmic solution Place 1 drop into both eyes at bedtime.  . metFORMIN (GLUCOPHAGE) 500 MG tablet TAKE 1 TABLET DAILY  . MULTIPLE VITAMINS-MINERALS PO Take by mouth.  . pantoprazole (PROTONIX) 40 MG tablet TAKE 1 TABLET DAILY  . spironolactone (ALDACTONE) 25 MG tablet TAKE 1 TABLET DAILY  . timolol (TIMOPTIC) 0.5 % ophthalmic solution     PHQ 2/9 Scores 07/08/2018 07/07/2017 07/04/2016 02/27/2016  PHQ - 2 Score 0 0 0 0  PHQ- 9 Score 0 - - -    Physical Exam  Constitutional: She is oriented to person, place, and time. She appears well-developed and well-nourished. No distress.  HENT:  Head: Normocephalic and atraumatic.  Right Ear: Tympanic membrane and ear canal normal.  Left Ear: Tympanic membrane and ear canal normal.  Nose: Right sinus exhibits no maxillary sinus tenderness. Left sinus exhibits no maxillary sinus tenderness.  Mouth/Throat: Uvula is midline and oropharynx is clear and moist.  Eyes: Conjunctivae and EOM are normal. Right eye exhibits no discharge. Left eye exhibits no discharge. No scleral icterus.  Neck: Normal range of motion. Carotid bruit is not present. No erythema present. No thyromegaly present.  Cardiovascular: Normal rate, regular rhythm, normal heart sounds and normal pulses.  Pulmonary/Chest: Effort normal. No respiratory  distress. She has no wheezes. Right breast exhibits no mass, no nipple discharge, no skin change and no tenderness. Left breast exhibits no mass, no nipple discharge, no skin change and no tenderness.  Abdominal: Soft. Bowel sounds are normal. There is no hepatosplenomegaly. There is no tenderness. There is no CVA tenderness.  Musculoskeletal: Normal range of motion.  Lymphadenopathy:    She has no cervical adenopathy.    She has no axillary adenopathy.  Neurological: She is alert and oriented to person, place, and time. She has normal reflexes. No cranial nerve deficit or sensory deficit.  Skin: Skin is warm, dry and intact. No rash noted.  Psychiatric: She has a normal mood and affect. Her speech is normal and behavior is normal. Thought content normal.  Nursing note and vitals reviewed.   BP 130/82 (BP Location: Right Arm, Patient Position: Sitting, Cuff Size: Normal)   Pulse 69   Ht 5\' 4"  (1.626 m)   Wt 132 lb (59.9 kg)   SpO2 100%   BMI 22.66 kg/m   Assessment and Plan: 1. Essential (primary) hypertension Controlled - continue current therapy - POCT urinalysis dipstick  2. Barrett's esophagus without dysplasia Continue PPI, if blood count lower or H Pylori positive, will need GI consult - CBC with Differential/Platelet  3. Controlled type 2 diabetes mellitus without complication, without long-term current use of insulin ([place]) Controlled on metformin - Comprehensive  metabolic panel - Hemoglobin A1c - TSH  4. Hyperlipidemia associated with type 2 diabetes mellitus (HCC) On atorvastain - Lipid panel  5. Hypercalcemia Check labs  6. Epigastric pain Continue PPI, check H pylori - H. pylori breath test   Partially dictated using Editor, commissioning. Any errors are unintentional.  Halina Maidens, MD [facility]  07/09/2018

## 2018-07-10 ENCOUNTER — Other Ambulatory Visit: Payer: Self-pay | Admitting: Internal Medicine

## 2018-07-10 DIAGNOSIS — Z1231 Encounter for screening mammogram for malignant neoplasm of breast: Secondary | ICD-10-CM

## 2018-07-10 LAB — COMPREHENSIVE METABOLIC PANEL
ALT: 17 IU/L (ref 0–32)
AST: 24 IU/L (ref 0–40)
Albumin/Globulin Ratio: 2.1 (ref 1.2–2.2)
Albumin: 4.4 g/dL (ref 3.5–4.8)
Alkaline Phosphatase: 52 IU/L (ref 39–117)
BILIRUBIN TOTAL: 0.5 mg/dL (ref 0.0–1.2)
BUN/Creatinine Ratio: 12 (ref 12–28)
BUN: 11 mg/dL (ref 8–27)
CALCIUM: 10.1 mg/dL (ref 8.7–10.3)
CHLORIDE: 97 mmol/L (ref 96–106)
CO2: 21 mmol/L (ref 20–29)
Creatinine, Ser: 0.93 mg/dL (ref 0.57–1.00)
GFR calc non Af Amer: 59 mL/min/{1.73_m2} — ABNORMAL LOW (ref >59)
GFR, EST AFRICAN AMERICAN: 68 mL/min/{1.73_m2} (ref >59)
GLUCOSE: 90 mg/dL (ref 65–99)
Globulin, Total: 2.1 g/dL (ref 1.5–4.5)
Potassium: 4.5 mmol/L (ref 3.5–5.2)
Sodium: 137 mmol/L (ref 134–144)
Total Protein: 6.5 g/dL (ref 6.0–8.5)

## 2018-07-10 LAB — CBC WITH DIFFERENTIAL/PLATELET
BASOS ABS: 0.1 10*3/uL (ref 0.0–0.2)
BASOS: 1 %
EOS (ABSOLUTE): 0.1 10*3/uL (ref 0.0–0.4)
Eos: 1 %
Hematocrit: 34.1 % (ref 34.0–46.6)
Hemoglobin: 11.9 g/dL (ref 11.1–15.9)
IMMATURE GRANS (ABS): 0 10*3/uL (ref 0.0–0.1)
IMMATURE GRANULOCYTES: 0 %
LYMPHS: 12 %
Lymphocytes Absolute: 0.9 10*3/uL (ref 0.7–3.1)
MCH: 33.1 pg — ABNORMAL HIGH (ref 26.6–33.0)
MCHC: 34.9 g/dL (ref 31.5–35.7)
MCV: 95 fL (ref 79–97)
Monocytes Absolute: 0.5 10*3/uL (ref 0.1–0.9)
Monocytes: 7 %
NEUTROS PCT: 79 %
Neutrophils Absolute: 5.6 10*3/uL (ref 1.4–7.0)
Platelets: 233 10*3/uL (ref 150–450)
RBC: 3.6 x10E6/uL — AB (ref 3.77–5.28)
RDW: 12.3 % (ref 12.3–15.4)
WBC: 7.1 10*3/uL (ref 3.4–10.8)

## 2018-07-10 LAB — LIPID PANEL
CHOL/HDL RATIO: 2.7 ratio (ref 0.0–4.4)
Cholesterol, Total: 140 mg/dL (ref 100–199)
HDL: 51 mg/dL (ref >39)
LDL Calculated: 76 mg/dL (ref 0–99)
TRIGLYCERIDES: 65 mg/dL (ref 0–149)
VLDL CHOLESTEROL CAL: 13 mg/dL (ref 5–40)

## 2018-07-10 LAB — H. PYLORI BREATH TEST: H pylori Breath Test: NEGATIVE

## 2018-07-10 LAB — HEMOGLOBIN A1C
Est. average glucose Bld gHb Est-mCnc: 126 mg/dL
Hgb A1c MFr Bld: 6 % — ABNORMAL HIGH (ref 4.8–5.6)

## 2018-07-10 LAB — TSH: TSH: 4.76 u[IU]/mL — ABNORMAL HIGH (ref 0.450–4.500)

## 2018-07-16 ENCOUNTER — Other Ambulatory Visit: Payer: Self-pay | Admitting: Internal Medicine

## 2018-07-16 DIAGNOSIS — E119 Type 2 diabetes mellitus without complications: Secondary | ICD-10-CM

## 2018-07-16 DIAGNOSIS — I10 Essential (primary) hypertension: Secondary | ICD-10-CM

## 2018-07-16 DIAGNOSIS — E785 Hyperlipidemia, unspecified: Principal | ICD-10-CM

## 2018-07-16 DIAGNOSIS — E1169 Type 2 diabetes mellitus with other specified complication: Secondary | ICD-10-CM

## 2018-07-21 ENCOUNTER — Ambulatory Visit
Admission: RE | Admit: 2018-07-21 | Discharge: 2018-07-21 | Disposition: A | Discharge status: Home / Self Care | Payer: Medicare Other | Source: Ambulatory Visit | Attending: Internal Medicine | Admitting: Internal Medicine

## 2018-07-21 DIAGNOSIS — E2839 Other primary ovarian failure: Secondary | ICD-10-CM | POA: Diagnosis not present

## 2018-07-21 DIAGNOSIS — M810 Age-related osteoporosis without current pathological fracture: Secondary | ICD-10-CM | POA: Diagnosis not present

## 2018-07-22 NOTE — Progress Notes (Signed)
Patient informed. Mailed a copy as requested.

## 2018-08-15 ENCOUNTER — Other Ambulatory Visit: Payer: Self-pay | Admitting: Internal Medicine

## 2018-08-15 DIAGNOSIS — E119 Type 2 diabetes mellitus without complications: Secondary | ICD-10-CM

## 2018-08-20 ENCOUNTER — Other Ambulatory Visit: Payer: Self-pay

## 2018-10-07 DIAGNOSIS — H338 Other retinal detachments: Secondary | ICD-10-CM | POA: Diagnosis not present

## 2018-10-07 DIAGNOSIS — Z961 Presence of intraocular lens: Secondary | ICD-10-CM | POA: Diagnosis not present

## 2018-10-07 DIAGNOSIS — H401121 Primary open-angle glaucoma, left eye, mild stage: Secondary | ICD-10-CM | POA: Diagnosis not present

## 2018-10-07 DIAGNOSIS — H401113 Primary open-angle glaucoma, right eye, severe stage: Secondary | ICD-10-CM | POA: Diagnosis not present

## 2018-10-07 DIAGNOSIS — E119 Type 2 diabetes mellitus without complications: Secondary | ICD-10-CM | POA: Diagnosis not present

## 2018-10-09 LAB — HM DIABETES EYE EXAM

## 2018-10-23 ENCOUNTER — Encounter: Payer: Self-pay | Admitting: Internal Medicine

## 2018-10-28 ENCOUNTER — Encounter: Payer: Self-pay | Admitting: Internal Medicine

## 2018-10-28 ENCOUNTER — Other Ambulatory Visit: Payer: Self-pay

## 2018-10-28 ENCOUNTER — Ambulatory Visit
Admission: RE | Admit: 2018-10-28 | Discharge: 2018-10-28 | Disposition: A | Discharge status: Home / Self Care | Payer: Medicare Other | Source: Ambulatory Visit | Attending: Internal Medicine | Admitting: Internal Medicine

## 2018-10-28 ENCOUNTER — Ambulatory Visit (INDEPENDENT_AMBULATORY_CARE_PROVIDER_SITE_OTHER): Payer: Medicare Other | Admitting: Internal Medicine

## 2018-10-28 ENCOUNTER — Ambulatory Visit
Admission: RE | Admit: 2018-10-28 | Discharge: 2018-10-28 | Disposition: A | Discharge status: Home / Self Care | Payer: Medicare Other | Attending: Internal Medicine | Admitting: Internal Medicine

## 2018-10-28 VITALS — BP 118/68 | HR 64 | Ht 64.0 in

## 2018-10-28 DIAGNOSIS — M25551 Pain in right hip: Secondary | ICD-10-CM | POA: Insufficient documentation

## 2018-10-28 DIAGNOSIS — M160 Bilateral primary osteoarthritis of hip: Secondary | ICD-10-CM | POA: Diagnosis not present

## 2018-10-28 MED ORDER — PREDNISONE 10 MG PO TABS: 10.0000 mg | ORAL_TABLET | ORAL | 0 refills | Status: AC

## 2018-10-28 NOTE — Progress Notes (Signed)
Date:  10/28/2018   Name:  Lori Harrell   DOB:  Jan 03, 1941   MRN:  [id number]   Chief Complaint: Leg Pain (R) leg pain radiating from hip down into thigh. Also, inside of knee pain. Painw as at a 10 on Sunday. Started SATURDAY. Can't hardly walk. No falls.)  Hip Pain   The incident occurred 2 days ago. There was no injury mechanism. The pain is present in the right hip, right thigh and right knee. The quality of the pain is described as aching and shooting. She has tried acetaminophen for the symptoms. The treatment provided mild relief.    Review of Systems  Constitutional: Negative for chills, diaphoresis, fatigue and fever.  Respiratory: Negative for shortness of breath.   Cardiovascular: Negative for chest pain.  Musculoskeletal: Positive for arthralgias, back pain and gait problem.    Patient Active Problem List   Diagnosis Date Noted  . Carotid artery stenosis, asymptomatic, bilateral 07/07/2017  . Primary open angle glaucoma 05/08/2017  . 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 Large hiatal hernia 07/04/2016  . Sigmoid diverticulitis 07/04/2016  . Stress incontinence 07/04/2016  . Type 2 diabetes mellitus with circulatory disorder ([place]) 10/31/2015  . Constipation 10/31/2015  . 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 Essential (primary) hypertension 06/16/2015  . Hyperlipidemia associated with type 2 diabetes mellitus ([place]) 06/16/2015  . OP (osteoporosis) 06/16/2015  . Hypercalcemia 04/22/2012    No Known Allergies  Past Surgical History:  Procedure Laterality Date  . CATARACT EXTRACTION    . COLONOSCOPY  2007   sigmoid diverticulosis  . TONSILLECTOMY    . UPPER GASTROINTESTINAL ENDOSCOPY  02/2013   Barrett's - done at [place] History   Tobacco Use  . Smoking status: Never Smoker  . Smokeless tobacco: Never Used  . Tobacco comment: smoking cessation materials not required  Substance Use Topics  . Alcohol use: No      Alcohol/week: 0.0 standard drinks  . Drug use: No     Medication list has been reviewed and updated.  Current Meds  Medication Sig  . Acetaminophen-Caffeine (EXCEDRIN TENSION HEADACHE) 500-65 MG TABS Take by mouth 3 (three) times daily.   Marland Kitchen aspirin 81 MG tablet Take by mouth.  Marland Kitchen atorvastatin (LIPITOR) 20 MG tablet TAKE 1 TABLET DAILY  . Calcium Carbonate-Vit D-Min (CALCIUM 1200 PO) Take by mouth. 4 times weekly  . enalapril (VASOTEC) 20 MG tablet TAKE 1 TABLET DAILY  . ferrous sulfate (IRON SUPPLEMENT) 325 (65 FE) MG tablet Take 1 tablet by mouth 3 (three) times a week.   Marland Kitchen FREESTYLE LITE test strip USE TO TEST BLOOD SUGAR DAILY  . Inulin (FIBER CHOICE FRUITY BITES) 1.5 g CHEW Chew by mouth.  . Lancets (FREESTYLE) lancets USE 1 LANCET DAILY  . latanoprost (XALATAN) 0.005 % ophthalmic solution Place 1 drop into both eyes at bedtime.  . metFORMIN (GLUCOPHAGE) 500 MG tablet TAKE 1 TABLET DAILY  . MULTIPLE VITAMINS-MINERALS PO Take by mouth.  . pantoprazole (PROTONIX) 40 MG tablet TAKE 1 TABLET DAILY  . spironolactone (ALDACTONE) 25 MG tablet TAKE 1 TABLET DAILY  . timolol (TIMOPTIC) 0.5 % ophthalmic solution     PHQ 2/9 Scores 10/28/2018 07/08/2018 07/07/2017 07/04/2016  PHQ - 2 Score 0 0 0 0  PHQ- 9 Score - 0 - -    Physical Exam Vitals signs and nursing note reviewed.  Constitutional:      General: She is  not in acute distress.    Appearance: She is well-developed.  HENT:     Head: Normocephalic and atraumatic.  Neck:     Musculoskeletal: Normal range of motion and neck supple.  Cardiovascular:     Rate and Rhythm: Normal rate and regular rhythm.  Pulmonary:     Effort: Pulmonary effort is normal. No respiratory distress.     Breath sounds: Normal breath sounds.  Musculoskeletal: Normal range of motion.     Lumbar back: She exhibits deformity (scoliosis).       Back:       Legs:  Skin:    General: Skin is warm and dry.     Findings: No rash.  Neurological:      Mental Status: She is alert and oriented to person, place, and time.     Motor: Weakness (mild weakness to SLR on right due to pain) present.     Deep Tendon Reflexes: Reflexes are normal and symmetric.  Psychiatric:        Behavior: Behavior normal.        Thought Content: Thought content normal.     BP 118/68   Pulse 64   Ht 5\' 4"  (1.626 m)   SpO2 100%   BMI 22.66 kg/m   Assessment and Plan: 1. Pain of right hip joint Continue to use heat Rest with activity as tolerated - DG HIP UNILAT WITH PELVIS 2-3 VIEWS RIGHT; Future - predniSONE (DELTASONE) 10 MG tablet; Take 1 tablet (10 mg total) by mouth as directed for 6 days. Take 6,5,4,3,2,1 then stop  Dispense: 21 tablet; Refill: 0   Partially dictated using Editor, [place]. Any errors are unintentional.  Halina Maidens, MD [facility]  10/28/2018

## 2018-11-05 ENCOUNTER — Other Ambulatory Visit: Payer: Self-pay | Admitting: Internal Medicine

## 2018-11-05 ENCOUNTER — Telehealth: Payer: Self-pay

## 2018-11-05 DIAGNOSIS — M25551 Pain in right hip: Secondary | ICD-10-CM

## 2018-11-05 NOTE — Telephone Encounter (Signed)
Patient called stating still having leg pains. Wants to be referred to ortho for this.  Please Advise.

## 2018-11-11 DIAGNOSIS — H401113 Primary open-angle glaucoma, right eye, severe stage: Secondary | ICD-10-CM | POA: Diagnosis not present

## 2018-11-11 DIAGNOSIS — Z961 Presence of intraocular lens: Secondary | ICD-10-CM | POA: Diagnosis not present

## 2018-11-11 DIAGNOSIS — H401121 Primary open-angle glaucoma, left eye, mild stage: Secondary | ICD-10-CM | POA: Diagnosis not present

## 2018-11-11 DIAGNOSIS — M1711 Unilateral primary osteoarthritis, right knee: Secondary | ICD-10-CM | POA: Diagnosis not present

## 2018-12-15 DIAGNOSIS — M5416 Radiculopathy, lumbar region: Secondary | ICD-10-CM | POA: Diagnosis not present

## 2018-12-15 DIAGNOSIS — M545 Low back pain: Secondary | ICD-10-CM | POA: Diagnosis not present

## 2018-12-18 DIAGNOSIS — M5416 Radiculopathy, lumbar region: Secondary | ICD-10-CM | POA: Diagnosis not present

## 2018-12-18 DIAGNOSIS — M545 Low back pain: Secondary | ICD-10-CM | POA: Diagnosis not present

## 2018-12-22 DIAGNOSIS — M5416 Radiculopathy, lumbar region: Secondary | ICD-10-CM | POA: Diagnosis not present

## 2018-12-22 DIAGNOSIS — M545 Low back pain: Secondary | ICD-10-CM | POA: Diagnosis not present

## 2018-12-24 DIAGNOSIS — M48061 Spinal stenosis, lumbar region without neurogenic claudication: Secondary | ICD-10-CM | POA: Diagnosis not present

## 2018-12-24 DIAGNOSIS — E119 Type 2 diabetes mellitus without complications: Secondary | ICD-10-CM | POA: Diagnosis not present

## 2019-01-06 ENCOUNTER — Ambulatory Visit: Payer: Self-pay | Admitting: Internal Medicine

## 2019-01-07 ENCOUNTER — Other Ambulatory Visit: Payer: Self-pay

## 2019-01-07 ENCOUNTER — Ambulatory Visit (INDEPENDENT_AMBULATORY_CARE_PROVIDER_SITE_OTHER): Payer: Medicare Other | Admitting: Internal Medicine

## 2019-01-07 ENCOUNTER — Ambulatory Visit: Payer: Medicare Other

## 2019-01-07 ENCOUNTER — Encounter: Payer: Self-pay | Admitting: Internal Medicine

## 2019-01-07 VITALS — BP 138/80 | HR 80 | Ht 64.0 in

## 2019-01-07 DIAGNOSIS — I10 Essential (primary) hypertension: Secondary | ICD-10-CM | POA: Diagnosis not present

## 2019-01-07 DIAGNOSIS — L89100 Pressure ulcer of unspecified part of back, unstageable: Secondary | ICD-10-CM

## 2019-01-07 DIAGNOSIS — E785 Hyperlipidemia, unspecified: Secondary | ICD-10-CM | POA: Diagnosis not present

## 2019-01-07 DIAGNOSIS — M51369 Other intervertebral disc degeneration, lumbar region without mention of lumbar back pain or lower extremity pain: Secondary | ICD-10-CM

## 2019-01-07 DIAGNOSIS — I6523 Occlusion and stenosis of bilateral carotid arteries: Secondary | ICD-10-CM

## 2019-01-07 DIAGNOSIS — E1169 Type 2 diabetes mellitus with other specified complication: Secondary | ICD-10-CM | POA: Diagnosis not present

## 2019-01-07 DIAGNOSIS — M5136 Other intervertebral disc degeneration, lumbar region: Secondary | ICD-10-CM | POA: Diagnosis not present

## 2019-01-07 DIAGNOSIS — E118 Type 2 diabetes mellitus with unspecified complications: Secondary | ICD-10-CM

## 2019-01-07 DIAGNOSIS — L89102 Pressure ulcer of unspecified part of back, stage 2: Secondary | ICD-10-CM | POA: Diagnosis not present

## 2019-01-07 HISTORY — DX: Pressure ulcer of unspecified part of back, unstageable: L89.100

## 2019-01-07 MED ORDER — MUPIROCIN CALCIUM 2 % EX CREA: 1.0000 "application " | TOPICAL_CREAM | Freq: Every day | CUTANEOUS | 0 refills | Status: DC

## 2019-01-07 NOTE — Progress Notes (Signed)
Date:  01/07/2019   Name:  Lori Harrell   DOB:  [birth year]   MRN:  [id number]   Chief Complaint: Diabetes; Hypertension; and Depression (18)  Diabetes  She presents for her follow-up diabetic visit. She has type 2 diabetes mellitus. Her disease course has been stable. Pertinent negatives for hypoglycemia include no dizziness, headaches, nervousness/anxiousness or tremors. Pertinent negatives for diabetes include no chest pain, no fatigue, no polydipsia and no polyuria. Current diabetic treatment includes oral agent (monotherapy) (metformin). She is compliant with treatment all of the time. Her weight is stable. She monitors blood glucose at home 3-4 x per week. There is no change in her home blood glucose trend. Her breakfast blood glucose is taken between 7-8 am. Her breakfast blood glucose range is generally 130-140 mg/dl. An ACE inhibitor/angiotensin II receptor blocker is being taken. Eye exam is current.  Hypertension  This is a chronic problem. The problem is controlled. Pertinent negatives include no chest pain, headaches, palpitations or shortness of breath. Past treatments include ACE inhibitors and diuretics. The current treatment provides significant improvement.  Back Pain  This is a chronic problem. The current episode started more than 1 month ago. The problem occurs constantly. The problem is unchanged. The pain is present in the lumbar spine. The quality of the pain is described as aching. Pertinent negatives include no abdominal pain, chest pain, dysuria, fever, headaches or numbness. Treatments tried: MRI done by [facility] - pinched nerve in lumbar spine, had ESI with minimal benefit.  Now being referred to a spinal surgeon.  CAS - last [place] 2018 - showed < 50% bilateral stenosis.  She is on atorvastatin and aspirin. Skin Ulcer - she noted some pain and mild bleeding of her mid back after her MRI last month.  Her daughter in law put some ointment on it one time.  No one  has evaluated it since then.  She has mild discomfort but has not seen any more bleeding.  Lab Results  Component Value Date   HGBA1C 6.0 (H) 07/09/2018   Lab Results  Component Value Date   CREATININE 0.93 07/09/2018   BUN 11 07/09/2018   NA 137 07/09/2018   K 4.5 07/09/2018   CL 97 07/09/2018   CO2 21 07/09/2018     Review of Systems  Constitutional: Negative for appetite change, fatigue, fever and unexpected weight change.  HENT: Negative for tinnitus and trouble swallowing.   Eyes: Negative for visual disturbance.  Respiratory: Negative for cough, chest tightness and shortness of breath.   Cardiovascular: Negative for chest pain, palpitations and leg swelling.  Gastrointestinal: Negative for abdominal pain.  Endocrine: Negative for polydipsia and polyuria.  Genitourinary: Negative for dysuria and hematuria.  Musculoskeletal: Positive for back pain. Negative for arthralgias.  Skin: Positive for wound.  Neurological: Negative for dizziness, tremors, numbness and headaches.  Psychiatric/Behavioral: Positive for dysphoric mood. Negative for sleep disturbance. The patient is not nervous/anxious.     Patient Active Problem List   Diagnosis Date Noted  . Carotid artery stenosis, asymptomatic, bilateral 07/07/2017  . Primary open angle glaucoma 05/08/2017  . 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 Large hiatal hernia 07/04/2016  . Sigmoid diverticulitis 07/04/2016  . Stress incontinence 07/04/2016  . Type II diabetes mellitus with manifestations ([place]) 10/31/2015  . Constipation 10/31/2015  . 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 Essential (primary) hypertension 06/16/2015  . Hyperlipidemia associated with type 2 diabetes mellitus ([place]) 06/16/2015  . OP (osteoporosis) 06/16/2015  .  Hypercalcemia 04/22/2012    No Known Allergies  Past Surgical History:  Procedure Laterality Date  . CATARACT EXTRACTION    . COLONOSCOPY  2007   sigmoid diverticulosis  . TONSILLECTOMY     . UPPER GASTROINTESTINAL ENDOSCOPY  02/2013   Barrett's - done at [place] History   Tobacco Use  . Smoking status: Never Smoker  . Smokeless tobacco: Never Used  . Tobacco comment: smoking cessation materials not required  Substance Use Topics  . Alcohol use: No    Alcohol/week: 0.0 standard drinks  . Drug use: No     Medication list has been reviewed and updated.  Current Meds  Medication Sig  . Acetaminophen-Caffeine (EXCEDRIN TENSION HEADACHE) 500-65 MG TABS Take by mouth 3 (three) times daily.   Marland Kitchen aspirin 81 MG tablet Take by mouth.  Marland Kitchen atorvastatin (LIPITOR) 20 MG tablet TAKE 1 TABLET DAILY  . Calcium Carbonate-Vit D-Min (CALCIUM 1200 PO) Take by mouth. 4 times weekly  . dorzolamide-timolol (COSOPT) 22.3-6.8 MG/ML ophthalmic solution Apply to eye.  . enalapril (VASOTEC) 20 MG tablet TAKE 1 TABLET DAILY  . ferrous sulfate (IRON SUPPLEMENT) 325 (65 FE) MG tablet Take 1 tablet by mouth 3 (three) times a week.   Marland Kitchen FREESTYLE LITE test strip USE TO TEST BLOOD SUGAR DAILY  . Inulin (FIBER CHOICE FRUITY BITES) 1.5 g CHEW Chew by mouth.  . Lancets (FREESTYLE) lancets USE 1 LANCET DAILY  . latanoprost (XALATAN) 0.005 % ophthalmic solution Place 1 drop into both eyes at bedtime.  . metFORMIN (GLUCOPHAGE) 500 MG tablet TAKE 1 TABLET DAILY  . MULTIPLE VITAMINS-MINERALS PO Take by mouth.  . pantoprazole (PROTONIX) 40 MG tablet TAKE 1 TABLET DAILY  . spironolactone (ALDACTONE) 25 MG tablet TAKE 1 TABLET DAILY  . timolol (TIMOPTIC) 0.5 % ophthalmic solution     PHQ 2/9 Scores 01/07/2019 10/28/2018 07/08/2018 07/07/2017  PHQ - 2 Score 4 0 0 0  PHQ- 9 Score 18 - 0 -    BP Readings from Last 3 Encounters:  01/07/19 138/80  10/28/18 118/68  07/09/18 130/82    Physical Exam Vitals signs and nursing note reviewed.  Constitutional:      General: She is not in acute distress.    Appearance: Normal appearance. She is well-developed.  HENT:     Head:  Normocephalic and atraumatic.  Neck:     Musculoskeletal: Normal range of motion.  Cardiovascular:     Rate and Rhythm: Normal rate and regular rhythm.     Heart sounds: No murmur.  Pulmonary:     Effort: Pulmonary effort is normal. No respiratory distress.     Breath sounds: No wheezing or rales.  Musculoskeletal:        General: Deformity (scoliosis of spine) present.  Lymphadenopathy:     Cervical: No cervical adenopathy.  Skin:    General: Skin is warm and dry.     Findings: Lesion present. No rash.       Neurological:     Mental Status: She is alert and oriented to person, place, and time.     Comments: Pt seen sitting in wheelchair.  Psychiatric:        Attention and Perception: Attention normal.        Behavior: Behavior normal.        Thought Content: Thought content normal.     Wt Readings from Last 3 Encounters:  07/09/18 132 lb (59.9 kg)  07/08/18  132 lb 6.4 oz (60.1 kg)  04/28/18 132 lb (59.9 kg)    BP 138/80   Pulse 80   Ht 5\' 4"  (1.626 m)   SpO2 99%   BMI 22.66 kg/m   Assessment and Plan: 1. Type II diabetes mellitus with manifestations (HCC) Stable FSBG, continue current regimen - Basic metabolic panel - Hemoglobin A1c  2. Essential (primary) hypertension controlled - CBC with Differential/Platelet  3. Hyperlipidemia associated with type 2 diabetes mellitus ([place]) On atorvastatin  4. Carotid artery stenosis, asymptomatic, bilateral Will repeat US in several months - US Carotid Duplex Bilateral; Future  5. Unstageable pressure ulcer of back ([place]) Recommend local care and evaluation daily Call if not improving - CBC with Differential/Platelet - mupirocin cream (BACTROBAN) 2 %; Apply 1 application topically daily.  Dispense: 15 g; Refill: 0  6. DDD (degenerative disc disease), lumbar Follow up with spine specialist (referred to Dr. Cari Caraway)   Partially dictated using [place]. Any errors are unintentional.  Halina Maidens, MD  [facility]  01/07/2019

## 2019-01-07 NOTE — Patient Instructions (Addendum)
Wash the ulcer area daily with warm water and mild soap, pat dry and apply Mupirocin cream and cover loosely with a bandaid - change daily and inspect area for change.  This information is directly available on the CDC website: RunningShows.co.za.html    Source:CDC Reference to specific commercial products, manufacturers, companies, or trademarks does not constitute its endorsement or recommendation by the [place], [place], or Centers for [employer] and [employer].

## 2019-01-08 ENCOUNTER — Other Ambulatory Visit: Payer: Self-pay | Admitting: Internal Medicine

## 2019-01-08 ENCOUNTER — Ambulatory Visit: Payer: Self-pay | Admitting: Internal Medicine

## 2019-01-08 DIAGNOSIS — K2270 Barrett's esophagus without dysplasia: Secondary | ICD-10-CM

## 2019-01-08 LAB — CBC WITH DIFFERENTIAL/PLATELET
Basophils Absolute: 0.1 10*3/uL (ref 0.0–0.2)
Basos: 1 %
EOS (ABSOLUTE): 0.1 10*3/uL (ref 0.0–0.4)
Eos: 1 %
Hematocrit: 38.2 % (ref 34.0–46.6)
Hemoglobin: 12.6 g/dL (ref 11.1–15.9)
Immature Grans (Abs): 0.1 10*3/uL (ref 0.0–0.1)
Immature Granulocytes: 1 %
Lymphocytes Absolute: 1.1 10*3/uL (ref 0.7–3.1)
Lymphs: 9 %
MCH: 32.1 pg (ref 26.6–33.0)
MCHC: 33 g/dL (ref 31.5–35.7)
MCV: 97 fL (ref 79–97)
Monocytes Absolute: 0.5 10*3/uL (ref 0.1–0.9)
Monocytes: 4 %
Neutrophils Absolute: 10.2 10*3/uL — ABNORMAL HIGH (ref 1.4–7.0)
Neutrophils: 84 %
Platelets: 376 10*3/uL (ref 150–450)
RBC: 3.93 x10E6/uL (ref 3.77–5.28)
RDW: 12.3 % (ref 11.7–15.4)
WBC: 12 10*3/uL — ABNORMAL HIGH (ref 3.4–10.8)

## 2019-01-08 LAB — BASIC METABOLIC PANEL
BUN/Creatinine Ratio: 13 (ref 12–28)
BUN: 11 mg/dL (ref 8–27)
CO2: 22 mmol/L (ref 20–29)
Calcium: 10.3 mg/dL (ref 8.7–10.3)
Chloride: 100 mmol/L (ref 96–106)
Creatinine, Ser: 0.82 mg/dL (ref 0.57–1.00)
GFR calc Af Amer: 79 mL/min/{1.73_m2} (ref >59)
GFR calc non Af Amer: 69 mL/min/{1.73_m2} (ref >59)
Glucose: 126 mg/dL — ABNORMAL HIGH (ref 65–99)
Potassium: 4.2 mmol/L (ref 3.5–5.2)
Sodium: 140 mmol/L (ref 134–144)

## 2019-01-08 LAB — HEMOGLOBIN A1C
Est. average glucose Bld gHb Est-mCnc: 131 mg/dL
Hgb A1c MFr Bld: 6.2 % — ABNORMAL HIGH (ref 4.8–5.6)

## 2019-01-20 ENCOUNTER — Other Ambulatory Visit: Payer: Self-pay | Admitting: Internal Medicine

## 2019-01-28 DIAGNOSIS — M5416 Radiculopathy, lumbar region: Secondary | ICD-10-CM | POA: Diagnosis not present

## 2019-02-01 ENCOUNTER — Ambulatory Visit (INDEPENDENT_AMBULATORY_CARE_PROVIDER_SITE_OTHER)
Admission: EM | Admit: 2019-02-01 | Discharge: 2019-02-01 | Disposition: A | Discharge status: Home / Self Care | Payer: Medicare Other | Source: Home / Self Care | Attending: Family Medicine | Admitting: Family Medicine

## 2019-02-01 ENCOUNTER — Ambulatory Visit
Admit: 2019-02-01 | Discharge: 2019-02-01 | Disposition: A | Discharge status: Home / Self Care | Payer: Medicare Other | Attending: Family Medicine | Admitting: Family Medicine

## 2019-02-01 ENCOUNTER — Inpatient Hospital Stay
Admission: EM | Admit: 2019-02-01 | Discharge: 2019-02-02 | DRG: 445 | Disposition: A | Discharge status: Other Acute Inpatient Hospital | Payer: Medicare Other | Attending: Specialist | Admitting: Specialist

## 2019-02-01 ENCOUNTER — Ambulatory Visit: Payer: Medicare Other | Admitting: Internal Medicine

## 2019-02-01 ENCOUNTER — Other Ambulatory Visit: Payer: Self-pay

## 2019-02-01 DIAGNOSIS — K5730 Diverticulosis of large intestine without perforation or abscess without bleeding: Secondary | ICD-10-CM | POA: Diagnosis present

## 2019-02-01 DIAGNOSIS — H401190 Primary open-angle glaucoma, unspecified eye, stage unspecified: Secondary | ICD-10-CM | POA: Diagnosis present

## 2019-02-01 DIAGNOSIS — Z1159 Encounter for screening for other viral diseases: Secondary | ICD-10-CM

## 2019-02-01 DIAGNOSIS — K810 Acute cholecystitis: Secondary | ICD-10-CM | POA: Diagnosis not present

## 2019-02-01 DIAGNOSIS — M810 Age-related osteoporosis without current pathological fracture: Secondary | ICD-10-CM | POA: Diagnosis present

## 2019-02-01 DIAGNOSIS — K2270 Barrett's esophagus without dysplasia: Secondary | ICD-10-CM | POA: Diagnosis present

## 2019-02-01 DIAGNOSIS — E785 Hyperlipidemia, unspecified: Secondary | ICD-10-CM | POA: Diagnosis present

## 2019-02-01 DIAGNOSIS — Z794 Long term (current) use of insulin: Secondary | ICD-10-CM

## 2019-02-01 DIAGNOSIS — R1084 Generalized abdominal pain: Secondary | ICD-10-CM | POA: Diagnosis not present

## 2019-02-01 DIAGNOSIS — E871 Hypo-osmolality and hyponatremia: Secondary | ICD-10-CM | POA: Diagnosis present

## 2019-02-01 DIAGNOSIS — Z9849 Cataract extraction status, unspecified eye: Secondary | ICD-10-CM

## 2019-02-01 DIAGNOSIS — R52 Pain, unspecified: Secondary | ICD-10-CM | POA: Diagnosis not present

## 2019-02-01 DIAGNOSIS — Z833 Family history of diabetes mellitus: Secondary | ICD-10-CM | POA: Diagnosis not present

## 2019-02-01 DIAGNOSIS — K449 Diaphragmatic hernia without obstruction or gangrene: Secondary | ICD-10-CM | POA: Diagnosis present

## 2019-02-01 DIAGNOSIS — E1169 Type 2 diabetes mellitus with other specified complication: Secondary | ICD-10-CM | POA: Diagnosis present

## 2019-02-01 DIAGNOSIS — E860 Dehydration: Secondary | ICD-10-CM | POA: Diagnosis present

## 2019-02-01 DIAGNOSIS — I10 Essential (primary) hypertension: Secondary | ICD-10-CM | POA: Diagnosis present

## 2019-02-01 DIAGNOSIS — Z7982 Long term (current) use of aspirin: Secondary | ICD-10-CM

## 2019-02-01 DIAGNOSIS — Z90710 Acquired absence of both cervix and uterus: Secondary | ICD-10-CM

## 2019-02-01 DIAGNOSIS — Z79899 Other long term (current) drug therapy: Secondary | ICD-10-CM

## 2019-02-01 DIAGNOSIS — K8050 Calculus of bile duct without cholangitis or cholecystitis without obstruction: Secondary | ICD-10-CM

## 2019-02-01 DIAGNOSIS — K219 Gastro-esophageal reflux disease without esophagitis: Secondary | ICD-10-CM | POA: Diagnosis present

## 2019-02-01 DIAGNOSIS — K8020 Calculus of gallbladder without cholecystitis without obstruction: Secondary | ICD-10-CM | POA: Diagnosis not present

## 2019-02-01 DIAGNOSIS — Z8249 Family history of ischemic heart disease and other diseases of the circulatory system: Secondary | ICD-10-CM | POA: Diagnosis not present

## 2019-02-01 DIAGNOSIS — Z5309 Procedure and treatment not carried out because of other contraindication: Secondary | ICD-10-CM | POA: Diagnosis present

## 2019-02-01 DIAGNOSIS — R17 Unspecified jaundice: Secondary | ICD-10-CM

## 2019-02-01 DIAGNOSIS — K8043 Calculus of bile duct with acute cholecystitis with obstruction: Secondary | ICD-10-CM | POA: Diagnosis not present

## 2019-02-01 DIAGNOSIS — R7989 Other specified abnormal findings of blood chemistry: Secondary | ICD-10-CM

## 2019-02-01 DIAGNOSIS — R945 Abnormal results of liver function studies: Secondary | ICD-10-CM

## 2019-02-01 DIAGNOSIS — Z03818 Encounter for observation for suspected exposure to other biological agents ruled out: Secondary | ICD-10-CM | POA: Diagnosis not present

## 2019-02-01 DIAGNOSIS — R1011 Right upper quadrant pain: Secondary | ICD-10-CM | POA: Diagnosis not present

## 2019-02-01 DIAGNOSIS — K8309 Other cholangitis: Secondary | ICD-10-CM

## 2019-02-01 DIAGNOSIS — Z9889 Other specified postprocedural states: Secondary | ICD-10-CM

## 2019-02-01 DIAGNOSIS — E119 Type 2 diabetes mellitus without complications: Secondary | ICD-10-CM | POA: Diagnosis not present

## 2019-02-01 HISTORY — DX: Other cholangitis: K83.09

## 2019-02-01 LAB — CBC WITH DIFFERENTIAL/PLATELET
Abs Immature Granulocytes: 0.25 10*3/uL — ABNORMAL HIGH (ref 0.00–0.07)
Basophils Absolute: 0 10*3/uL (ref 0.0–0.1)
Basophils Relative: 0 %
Eosinophils Absolute: 0 10*3/uL (ref 0.0–0.5)
Eosinophils Relative: 0 %
HCT: 35.1 % — ABNORMAL LOW (ref 36.0–46.0)
Hemoglobin: 12 g/dL (ref 12.0–15.0)
Immature Granulocytes: 2 %
Lymphocytes Relative: 6 %
Lymphs Abs: 0.9 10*3/uL (ref 0.7–4.0)
MCH: 32.9 pg (ref 26.0–34.0)
MCHC: 34.2 g/dL (ref 30.0–36.0)
MCV: 96.2 fL (ref 80.0–100.0)
Monocytes Absolute: 0.9 10*3/uL (ref 0.1–1.0)
Monocytes Relative: 6 %
Neutro Abs: 13.4 10*3/uL — ABNORMAL HIGH (ref 1.7–7.7)
Neutrophils Relative %: 86 %
Platelets: 294 10*3/uL (ref 150–400)
RBC: 3.65 MIL/uL — ABNORMAL LOW (ref 3.87–5.11)
RDW: 13.5 % (ref 11.5–15.5)
WBC: 15.4 10*3/uL — ABNORMAL HIGH (ref 4.0–10.5)
nRBC: 0 % (ref 0.0–0.2)

## 2019-02-01 LAB — COMPREHENSIVE METABOLIC PANEL
ALT: 232 U/L — ABNORMAL HIGH (ref 0–44)
AST: 163 U/L — ABNORMAL HIGH (ref 15–41)
Albumin: 3.3 g/dL — ABNORMAL LOW (ref 3.5–5.0)
Alkaline Phosphatase: 339 U/L — ABNORMAL HIGH (ref 38–126)
Anion gap: 10 (ref 5–15)
BUN: 24 mg/dL — ABNORMAL HIGH (ref 8–23)
CO2: 22 mmol/L (ref 22–32)
Calcium: 9.7 mg/dL (ref 8.9–10.3)
Chloride: 96 mmol/L — ABNORMAL LOW (ref 98–111)
Creatinine, Ser: 0.8 mg/dL (ref 0.44–1.00)
GFR calc Af Amer: >60 mL/min (ref >60)
GFR calc non Af Amer: >60 mL/min (ref >60)
Glucose, Bld: 195 mg/dL — ABNORMAL HIGH (ref 70–99)
Potassium: 3.4 mmol/L — ABNORMAL LOW (ref 3.5–5.1)
Sodium: 128 mmol/L — ABNORMAL LOW (ref 135–145)
Total Bilirubin: 4.3 mg/dL — ABNORMAL HIGH (ref 0.3–1.2)
Total Protein: 7 g/dL (ref 6.5–8.1)

## 2019-02-01 LAB — SARS CORONAVIRUS 2 BY RT PCR (HOSPITAL ORDER, PERFORMED IN ~~LOC~~ HOSPITAL LAB): SARS Coronavirus 2: NEGATIVE

## 2019-02-01 LAB — PROTIME-INR
INR: 1 (ref 0.8–1.2)
Prothrombin Time: 12.8 seconds (ref 11.4–15.2)

## 2019-02-01 LAB — LIPASE, BLOOD: Lipase: 34 U/L (ref 11–51)

## 2019-02-01 LAB — GLUCOSE, CAPILLARY: Glucose-Capillary: 120 mg/dL — ABNORMAL HIGH (ref 70–99)

## 2019-02-01 MED ORDER — ASPIRIN-ACETAMINOPHEN-CAFFEINE 250-250-65 MG PO TABS
1.0000 | ORAL_TABLET | Freq: Three times a day (TID) | ORAL | Status: DC | PRN
  Filled 2019-02-01: qty 1

## 2019-02-01 MED ORDER — LATANOPROST 0.005 % OP SOLN
1.0000 [drp] | Freq: Every day | OPHTHALMIC | Status: DC
  Administered 2019-02-01: 1 [drp] via OPHTHALMIC
  Filled 2019-02-01: qty 2.5

## 2019-02-01 MED ORDER — INSULIN ASPART 100 UNIT/ML ~~LOC~~ SOLN: 0.0000 [IU] | Freq: Every day | SUBCUTANEOUS | Status: DC

## 2019-02-01 MED ORDER — MUPIROCIN CALCIUM 2 % EX CREA
1.0000 "application " | TOPICAL_CREAM | Freq: Every day | CUTANEOUS | Status: DC | PRN
  Filled 2019-02-01: qty 15

## 2019-02-01 MED ORDER — PANTOPRAZOLE SODIUM 40 MG PO TBEC
40.0000 mg | DELAYED_RELEASE_TABLET | Freq: Every day | ORAL | Status: DC
  Administered 2019-02-02: 40 mg via ORAL
  Filled 2019-02-01: qty 1

## 2019-02-01 MED ORDER — SODIUM CHLORIDE 0.9 % IV SOLN
INTRAVENOUS | Status: DC
  Administered 2019-02-01 – 2019-02-02 (×2): via INTRAVENOUS

## 2019-02-01 MED ORDER — SODIUM CHLORIDE 0.9 % IV BOLUS
1000.0000 mL | Freq: Once | INTRAVENOUS | Status: AC
  Administered 2019-02-01: 13:00:00 1000 mL via INTRAVENOUS

## 2019-02-01 MED ORDER — ENOXAPARIN SODIUM 40 MG/0.4ML ~~LOC~~ SOLN
40.0000 mg | SUBCUTANEOUS | Status: DC
  Administered 2019-02-01: 40 mg via SUBCUTANEOUS
  Filled 2019-02-01: qty 0.4

## 2019-02-01 MED ORDER — PIPERACILLIN-TAZOBACTAM 3.375 G IVPB
3.3750 g | Freq: Three times a day (TID) | INTRAVENOUS | Status: DC
  Administered 2019-02-01 – 2019-02-02 (×3): 3.375 g via INTRAVENOUS
  Filled 2019-02-01 (×3): qty 50

## 2019-02-01 MED ORDER — ENALAPRIL MALEATE 20 MG PO TABS
20.0000 mg | ORAL_TABLET | Freq: Every day | ORAL | Status: DC
  Administered 2019-02-02: 20 mg via ORAL
  Filled 2019-02-01: qty 2

## 2019-02-01 MED ORDER — SPIRONOLACTONE 25 MG PO TABS
25.0000 mg | ORAL_TABLET | Freq: Every day | ORAL | Status: DC
  Administered 2019-02-02: 25 mg via ORAL
  Filled 2019-02-01: qty 1

## 2019-02-01 MED ORDER — PIPERACILLIN-TAZOBACTAM 3.375 G IVPB 30 MIN
3.3750 g | Freq: Once | INTRAVENOUS | Status: AC
  Administered 2019-02-01: 14:00:00 3.375 g via INTRAVENOUS
  Filled 2019-02-01: qty 50

## 2019-02-01 MED ORDER — FERROUS SULFATE 325 (65 FE) MG PO TABS: 325.0000 mg | ORAL_TABLET | ORAL | Status: DC

## 2019-02-01 MED ORDER — INSULIN ASPART 100 UNIT/ML ~~LOC~~ SOLN: 0.0000 [IU] | Freq: Three times a day (TID) | SUBCUTANEOUS | Status: DC

## 2019-02-01 NOTE — ED Notes (Signed)
Pt assisted to the toilet, pt able to walk unassisted

## 2019-02-01 NOTE — ED Triage Notes (Signed)
Pt to ED via EMS from [facility] urgent care, pt states she been having abd pain for about a week, at [facility] urgent care pt was found to have 67mm common bile duct stone. Pt states pain is 4/10. Pt states she had 2 episodes of vomiting last week. Pt denies n/v now. VSS. Nad.

## 2019-02-01 NOTE — ED Notes (Signed)
Son called to receive update at this time, this RN made family aware that pt is still waiting for bed in ED and does not know when surgery will be. Informed he is able to call for update at any time during visit. Family verbalize understanding and have no further concerns.

## 2019-02-01 NOTE — Consult Note (Signed)
Lori Harrell , MD [address] [place], [place], [place], [address] [address], [place], [place], [place], [address] Phone: [phone]  Fax: [phone]  Consultation  Referring Provider:     No ref. provider found Primary Care Physician:  Glean Hess, MD Primary Gastroenterologist:  Dr. Quillian Quince wild   Reason for Consultation:     ERCP  Date of Admission:  02/01/2019 Date of Consultation:  02/01/2019         HPI:   Lori Harrell is a 78 y.o. female presented to the ER today after being referred from [facility] urgent care clinic by Dr Lacinda Axon. She presented to him with a 10 day history of abdominal pain. Some vomiting, chills , ongoing for a month.   Found to have elevated LFT's including T bil 4.3, dilated CBD on CT abdomen with features of cholecystitis and a 15 mm stone in distal CBD .     Past Medical History:  Diagnosis Date  . Acid reflux   . 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 Diabetes mellitus without complication ([place])   . Hypertension     Past Surgical History:  Procedure Laterality Date  . CATARACT EXTRACTION    . COLONOSCOPY  2007   sigmoid diverticulosis  . TONSILLECTOMY    . UPPER GASTROINTESTINAL ENDOSCOPY  02/2013   Barrett's - done at [facility]      Prior to Admission medications   Medication Sig Start Date End Date Taking? Authorizing Provider  Acetaminophen-Caffeine (EXCEDRIN TENSION HEADACHE) 500-65 MG TABS Take by mouth 3 (three) times daily.     [provider]  aspirin 81 MG tablet Take by mouth.    [provider]  atorvastatin (LIPITOR) 20 MG tablet TAKE 1 TABLET DAILY 07/16/18   Glean Hess, MD  Calcium Carbonate-Vit D-Min (CALCIUM 1200 PO) Take by mouth. 4 times weekly    [provider]  dorzolamide-timolol (COSOPT) 22.3-6.8 MG/ML ophthalmic solution Apply to eye. 11/11/18 11/11/19  [provider]  enalapril (VASOTEC) 20 MG tablet TAKE 1 TABLET DAILY  01/20/19   Glean Hess, MD  ferrous sulfate (IRON SUPPLEMENT) 325 (65 FE) MG tablet Take 1 tablet by mouth 3 (three) times a week.     [provider]  FREESTYLE LITE test strip USE TO TEST BLOOD SUGAR DAILY 03/05/18   Glean Hess, MD  Inulin (FIBER CHOICE FRUITY BITES) 1.5 g CHEW Chew by mouth.    [provider]  Lancets (FREESTYLE) lancets USE 1 LANCET DAILY 08/15/18   Glean Hess, MD  latanoprost (XALATAN) 0.005 % ophthalmic solution Place 1 drop into both eyes at bedtime. 10/09/15   [provider]  metFORMIN (GLUCOPHAGE) 500 MG tablet TAKE 1 TABLET DAILY 07/16/18   Glean Hess, MD  MULTIPLE VITAMINS-MINERALS PO Take by mouth.    [provider]  mupirocin cream (BACTROBAN) 2 % Apply 1 application topically daily. 01/07/19   Glean Hess, MD  pantoprazole (PROTONIX) 40 MG tablet TAKE 1 TABLET DAILY 01/08/19   Glean Hess, MD  spironolactone (ALDACTONE) 25 MG tablet TAKE 1 TABLET DAILY 07/16/18   Glean Hess, MD  timolol (TIMOPTIC) 0.5 % ophthalmic solution  04/10/15   [provider]    Family History  Problem Relation Age of Onset  . Diabetes Father   . CAD Father   . Breast cancer Neg Hx      Social History  Tobacco Use  . Smoking status: Never Smoker  . Smokeless tobacco: Never Used  . Tobacco comment: smoking cessation materials not required  Substance Use Topics  . Alcohol use: No    Alcohol/week: 0.0 standard drinks  . Drug use: No    Allergies as of 02/01/2019  . (No Known Allergies)    Review of Systems:    All systems reviewed and negative except where noted in HPI.   Physical Exam:  Vital signs in last 24 hours: Temp:  [97.7 F (36.5 C)-98.2 F (36.8 C)] 98.2 F (36.8 C) (05/04 1304) Pulse Rate:  [92-93] 93 (05/04 1304) Resp:  [16-18] 16 (05/04 1304) BP: (129-138)/(84-90) 138/90 (05/04 1304) SpO2:  [100 %] 100 % (05/04 1304) Weight:  [55.3 kg] 55.3 kg (05/04 1259)    General:   Pleasant, cooperative in NAD Head:  Normocephalic and atraumatic. Eyes:   No icterus.   Conjunctiva pink. PERRLA. Ears:  Normal auditory acuity. Neck:  Supple; no masses or thyroidomegaly Lungs: Respirations even and unlabored. Lungs clear to auscultation bilaterally.   No wheezes, crackles, or rhonchi.  Heart:  Regular rate and rhythm;  Without murmur, clicks, rubs or gallops Abdomen:  Soft, nondistended, nontender. Normal bowel sounds. No appreciable masses or hepatomegaly.  No rebound or guarding.  Neurologic:  Alert and oriented x3;  grossly normal neurologically. Skin:  Intact without significant lesions or rashes. Cervical Nodes:  No significant cervical adenopathy. Psych:  Alert and cooperative. Normal affect.  LAB RESULTS: Recent Labs    02/01/19 1055  WBC 15.4*  HGB 12.0  HCT 35.1*  PLT 294   BMET Recent Labs    02/01/19 1055  NA 128*  K 3.4*  CL 96*  CO2 22  GLUCOSE 195*  BUN 24*  CREATININE 0.80  CALCIUM 9.7   LFT Recent Labs    02/01/19 1055  PROT 7.0  ALBUMIN 3.3*  AST 163*  ALT 232*  ALKPHOS 339*  BILITOT 4.3*   PT/INR No results for input(s): LABPROT, INR in the last 72 hours.  STUDIES: Ct Abdomen Pelvis Wo Contrast  Result Date: 02/01/2019 CLINICAL DATA:  Epigastric and right upper quadrant abdominal pain. EXAM: CT ABDOMEN AND PELVIS WITHOUT CONTRAST TECHNIQUE: Multidetector CT imaging of the abdomen and pelvis was performed following the standard protocol without IV contrast. COMPARISON:  None. FINDINGS: Lower chest: The patient has a large hiatal hernia containing most of the stomach as well as portions of the transverse colon. Heart size is normal. Coronary artery calcifications. Pericardial effusion. Lung bases are clear. Hepatobiliary: Gallbladder is markedly distended thickening of the gallbladder wall. There are multiple stones in the gallbladder. There is a 15 mm stone in the distal common bile duct. There is intra and  extrahepatic biliary dilatation. Pancreas: Pancreas appears normal except for the stone in the distal common bile duct adjacent to the head of the pancreas. Spleen: Normal in size without focal abnormality. Adrenals/Urinary Tract: Adrenal glands are unremarkable. Kidneys are normal, without renal calculi, focal lesion, or hydronephrosis. Bladder is unremarkable. Stomach/Bowel: There is a huge hiatal hernia containing most of the stomach as well as portions of the transverse colon. There multiple diverticula in the distal colon without diverticulitis. Small bowel and appendix appear normal. Vascular/Lymphatic: Aortic atherosclerosis. No enlarged abdominal or pelvic lymph nodes. Reproductive: Status post hysterectomy. No adnexal masses. Other: No abdominal wall hernia or abnormality. No abdominopelvic ascites. Musculoskeletal: No acute abnormality. Lumbar scoliosis with multilevel degenerative disc disease. IMPRESSION: 1. 15 mm stone obstructing  the distal common bile duct with dilated intra and extrahepatic bile ducts. The gallbladder is markedly distended 15 cm in length. Multiple stones in the gallbladder. Gallbladder wall is thickened. 2. Huge hiatal hernia containing most of the stomach and portions of the transverse colon. 3. Sigmoid diverticulosis. Critical Value/emergent results were called by telephone at the time of interpretation on 02/01/2019 at 12:05 pm to Dr. Thersa Salt , who verbally acknowledged these results. Electronically Signed   By: Lorriane Shire M.D.   On: 02/01/2019 12:07      Impression / Plan:   Lori Harrell is a 78 y.o. y/o female presents to the ER with ongoing abdominal pain for weeks and diagnosed with acute cholecystitis, choledocholithiasis. Possible moderate acute cholangitis.   Plan  1. Cover with antibiotics for acute cholangitis suggest either Zosyn or Ertapenem . 2. Plan for ERCP tomorrow. 3. Surgical consult to decide on timing of cholecystectomy   I have  discussed alternative options, risks & benefits,  which include, but are not limited to, bleeding, infection, perforation,respiratory complication,drug reaction, acute pancreatitis and death.  The patient agrees with this plan & written consent will be obtained.     Thank you for involving me in the care of this patient.      LOS: 0 days   Lori Bellows, MD  02/01/2019, 1:39 PM

## 2019-02-01 NOTE — ED Provider Notes (Signed)
Patient seen by me, CT evidence confirms choledocholithiasis.  We will discuss with GI for ERCP.   Earleen Newport, MD 02/01/19 [phone]

## 2019-02-01 NOTE — ED Notes (Signed)
Patient assisted to restroom by this writer.  

## 2019-02-01 NOTE — Progress Notes (Signed)
Pharmacy Antibiotic Note  Lori Harrell is a 78 y.o. female admitted on 02/01/2019 with cholangitis.  Pharmacy has been consulted for Zosyn dosing.  Plan: Zosyn 3.375 gm IV X 1 given over 30 minutes in ED on 5/4 @ 1330. Zosyn 3.375 gm IV Q8H EI ordered to continue on 5/4 @ 1939.  Height: 5\' 1"  (154.9 cm) Weight: 121 lb 14.6 oz (55.3 kg) IBW/kg (Calculated) : 47.8  Temp (24hrs), Avg:98 F (36.7 C), Min:97.7 F (36.5 C), Max:98.2 F (36.8 C)  Recent Labs  Lab 02/01/19 1055  WBC 15.4*  CREATININE 0.80    Estimated Creatinine Clearance: 43.7 mL/min (by C-G formula based on SCr of 0.8 mg/dL).    No Known Allergies  Antimicrobials this admission:   >>    >>   Dose adjustments this admission:   Microbiology results:  BCx:   UCx:    Sputum:    MRSA PCR:   Thank you for allowing pharmacy to be a part of this patient's care.  Zohal Reny D 02/01/2019 7:32 PM

## 2019-02-01 NOTE — ED Provider Notes (Signed)
[facility] Emergency Department Provider Note  ____________________________________________  Time seen: Approximately 1:02 PM  I have reviewed the triage vital signs and the nursing notes.   HISTORY  Chief Complaint Abdominal Pain    HPI Lori Harrell is a 78 y.o. female who presents the emergency department via EMS from [facility] urgent care.  Patient reports that she has had bilateral upper abdominal pain for approximately a week, she describes it worse on the left than right.  Patient denies any  fevers but does endorse chills.  Patient does endorse some nausea, one episode of vomiting.  She endorses decreased appetite over the past week.  Patient states that she was able to eat earlier this morning but has had nothing since breakfast.  Patient does have a history of hiatal hernia, Barrett's esophagus.  Patient denies any headache, neck pain, chest pain, shortness of breath.  No urinary complaints.  On review of medical records from urgent care, patient does have an elevated white blood cell count of 15.4, hyponatremia at 128, elevated AST and ALT.  Patient also has an elevated alkaline phosphatase.  At this point, they proceeded with a CT scan which reveals distended gallbladder with gallstone in the common bile duct.  Patient has had no medications from urgent care or in route via EMS.        Past Medical History:  Diagnosis Date  . Acid reflux   . 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 Diabetes mellitus without complication ([place])   . Hypertension     Patient Active Problem List   Diagnosis Date Noted  . Acute cholangitis 02/01/2019  . Unstageable pressure ulcer of back ([place]) 01/07/2019  . DDD (degenerative disc disease), lumbar 01/07/2019  . Carotid artery stenosis, asymptomatic, bilateral 07/07/2017  . Primary open angle glaucoma 05/08/2017  . 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 Large hiatal hernia 07/04/2016  .  Sigmoid diverticulitis 07/04/2016  . Stress incontinence 07/04/2016  . Type II diabetes mellitus with manifestations ([place]) 10/31/2015  . Constipation 10/31/2015  . 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 Essential (primary) hypertension 06/16/2015  . Hyperlipidemia associated with type 2 diabetes mellitus ([place]) 06/16/2015  . OP (osteoporosis) 06/16/2015  . Hypercalcemia 04/22/2012    Past Surgical History:  Procedure Laterality Date  . CATARACT EXTRACTION    . COLONOSCOPY  2007   sigmoid diverticulosis  . TONSILLECTOMY    . UPPER GASTROINTESTINAL ENDOSCOPY  02/2013   Barrett's - done at [facility]      Prior to Admission medications   Medication Sig Start Date End Date Taking? Authorizing Provider  aspirin 81 MG tablet Take 81 mg by mouth daily.    Yes [provider]  atorvastatin (LIPITOR) 20 MG tablet TAKE 1 TABLET DAILY Patient taking differently: Take 20 mg by mouth daily at 6 PM.  07/16/18  Yes Glean Hess, MD  Calcium Carbonate-Vit D-Min (CALCIUM 1200 PO) Take by mouth. 4 times weekly   Yes [provider]  dorzolamide-timolol (COSOPT) 22.3-6.8 MG/ML ophthalmic solution Apply to eye. 11/11/18 11/11/19 Yes [provider]  enalapril (VASOTEC) 20 MG tablet TAKE 1 TABLET DAILY Patient taking differently: Take 20 mg by mouth daily.  01/20/19  Yes Glean Hess, MD  ferrous sulfate (IRON SUPPLEMENT) 325 (65 FE) MG tablet Take 1 tablet by mouth 3 (three) times a week.    Yes [provider]  Inulin (FIBER CHOICE FRUITY BITES) 1.5 g CHEW Chew  by mouth.   Yes [provider]  latanoprost (XALATAN) 0.005 % ophthalmic solution Place 1 drop into both eyes at bedtime. 10/09/15  Yes [provider]  metFORMIN (GLUCOPHAGE) 500 MG tablet TAKE 1 TABLET DAILY 07/16/18  Yes Glean Hess, MD  MULTIPLE VITAMINS-MINERALS PO Take by mouth.   Yes [provider]  pantoprazole (PROTONIX) 40 MG tablet TAKE 1 TABLET  DAILY 01/08/19  Yes Glean Hess, MD  spironolactone (ALDACTONE) 25 MG tablet TAKE 1 TABLET DAILY 07/16/18  Yes Glean Hess, MD  timolol (TIMOPTIC) 0.5 % ophthalmic solution  04/10/15  Yes [provider]  Acetaminophen-Caffeine (EXCEDRIN TENSION HEADACHE) 500-65 MG TABS Take by mouth 3 (three) times daily.     [provider]  FREESTYLE LITE test strip USE TO TEST BLOOD SUGAR DAILY 03/05/18   Glean Hess, MD  Lancets (FREESTYLE) lancets USE 1 LANCET DAILY 08/15/18   Glean Hess, MD  mupirocin cream (BACTROBAN) 2 % Apply 1 application topically daily. 01/07/19   Glean Hess, MD    Allergies Patient has no known allergies.  Family History  Problem Relation Age of Onset  . Diabetes Father   . CAD Father   . Breast cancer Neg Hx     Social History Social History   Tobacco Use  . Smoking status: Never Smoker  . Smokeless tobacco: Never Used  . Tobacco comment: smoking cessation materials not required  Substance Use Topics  . Alcohol use: No    Alcohol/week: 0.0 standard drinks  . Drug use: No     Review of Systems  Constitutional: No fever/chills Eyes: No visual changes. No discharge ENT: No upper respiratory complaints. Cardiovascular: no chest pain. Respiratory: no cough. No SOB. Gastrointestinal: Positive for upper abdominal pain which patient describes is worse on the left than right..  Decreased appetite.  Positive for nausea, 1 episode of emesis..  No diarrhea.  No constipation. Genitourinary: Negative for dysuria. No hematuria Musculoskeletal: Negative for musculoskeletal pain. Skin: Negative for rash, abrasions, lacerations, ecchymosis. Neurological: Negative for headaches, focal weakness or numbness. 10-point ROS otherwise negative.  ____________________________________________   PHYSICAL EXAM:  VITAL SIGNS: ED Triage Vitals [02/01/19 1259]  Enc Vitals Group     BP      Pulse      Resp      Temp      Temp src       SpO2      Weight 121 lb 14.6 oz (55.3 kg)     Height 5\' 1"  (1.549 m)     Head Circumference      Peak Flow      Pain Score 7     Pain Loc      Pain Edu?      Excl. in [place]?      Constitutional: Alert and oriented. Well appearing and in no acute distress. Eyes: Conjunctivae are normal. PERRL. EOMI. Head: Atraumatic. ENT:      Ears:       Nose: No congestion/rhinnorhea.      Mouth/Throat: Mucous membranes are moist.  Neck: No stridor.    Cardiovascular: Normal rate, regular rhythm. Normal S1 and S2.  Good peripheral circulation. Respiratory: Normal respiratory effort without tachypnea or retractions. Lungs CTAB. Good air entry to the bases with no decreased or absent breath sounds. Gastrointestinal: Bowel sounds 4 quadrants.  Soft to palpation all quadrants.  Patient is diffusely tender to palpation in the right upper and left upper quadrant.  Positive Murphy sign.  No tenderness to palpation in bilateral lower quadrants.. No guarding or rigidity. No palpable masses. No distention. No CVA tenderness. Musculoskeletal: Full range of motion to all extremities. No gross deformities appreciated. Neurologic:  Normal speech and language. No gross focal neurologic deficits are appreciated.  Skin:  Skin is warm, dry and intact. No rash noted. Psychiatric: Mood and affect are normal. Speech and behavior are normal. Patient exhibits appropriate insight and judgement.   ____________________________________________   LABS (all labs ordered are listed, but only abnormal results are displayed)  Labs Reviewed  SARS CORONAVIRUS 2 ([facility])  PROTIME-INR  HEMOGLOBIN A1C  CBC  COMPREHENSIVE METABOLIC PANEL  MAGNESIUM  PHOSPHORUS     Labs (FROM URGENT CARE)       Labs Reviewed  CBC WITH DIFFERENTIAL/PLATELET - Abnormal; Notable for the following components:      Result Value    WBC 15.4 (*)    RBC 3.65 (*)    HCT 35.1 (*)    Neutro  Abs 13.4 (*)    Abs Immature Granulocytes 0.25 (*)    All other components within normal limits  COMPREHENSIVE METABOLIC PANEL - Abnormal; Notable for the following components:   Sodium 128 (*)    Potassium 3.4 (*)    Chloride 96 (*)    Glucose, Bld 195 (*)    BUN 24 (*)    Albumin 3.3 (*)    AST 163 (*)    ALT 232 (*)    Alkaline Phosphatase 339 (*)    Total Bilirubin 4.3 (*)    All other components within normal limits  LIPASE, BLOOD    ____________________________________________  EKG   ____________________________________________  RADIOLOGY I personally viewed and evaluated these images as part of my medical decision making, as well as reviewing the written report by the radiologist.  Ct Abdomen Pelvis Wo Contrast  Result Date: 02/01/2019 CLINICAL DATA:  Epigastric and right upper quadrant abdominal pain. EXAM: CT ABDOMEN AND PELVIS WITHOUT CONTRAST TECHNIQUE: Multidetector CT imaging of the abdomen and pelvis was performed following the standard protocol without IV contrast. COMPARISON:  None. FINDINGS: Lower chest: The patient has a large hiatal hernia containing most of the stomach as well as portions of the transverse colon. Heart size is normal. Coronary artery calcifications. Pericardial effusion. Lung bases are clear. Hepatobiliary: Gallbladder is markedly distended thickening of the gallbladder wall. There are multiple stones in the gallbladder. There is a 15 mm stone in the distal common bile duct. There is intra and extrahepatic biliary dilatation. Pancreas: Pancreas appears normal except for the stone in the distal common bile duct adjacent to the head of the pancreas. Spleen: Normal in size without focal abnormality. Adrenals/Urinary Tract: Adrenal glands are unremarkable. Kidneys are normal, without renal calculi, focal lesion, or hydronephrosis. Bladder is unremarkable. Stomach/Bowel: There is a huge hiatal hernia containing most of the stomach  as well as portions of the transverse colon. There multiple diverticula in the distal colon without diverticulitis. Small bowel and appendix appear normal. Vascular/Lymphatic: Aortic atherosclerosis. No enlarged abdominal or pelvic lymph nodes. Reproductive: Status post hysterectomy. No adnexal masses. Other: No abdominal wall hernia or abnormality. No abdominopelvic ascites. Musculoskeletal: No acute abnormality. Lumbar scoliosis with multilevel degenerative disc disease. IMPRESSION: 1. 15 mm stone obstructing the distal common bile duct with dilated intra and extrahepatic bile ducts. The gallbladder is markedly distended 15 cm in length. Multiple stones in the gallbladder. Gallbladder wall is thickened. 2.  Huge hiatal hernia containing most of the stomach and portions of the transverse colon. 3. Sigmoid diverticulosis. Critical Value/emergent results were called by telephone at the time of interpretation on 02/01/2019 at 12:05 pm to Dr. Thersa Salt , who verbally acknowledged these results. Electronically Signed   By: Lorriane Shire M.D.   On: 02/01/2019 12:07    ____________________________________________    PROCEDURES  Procedure(s) performed:    Procedures    Medications  aspirin-acetaminophen-caffeine (EXCEDRIN MIGRAINE) per tablet 1 tablet (has no administration in time range)  enalapril (VASOTEC) tablet 20 mg (has no administration in time range)  spironolactone (ALDACTONE) tablet 25 mg (has no administration in time range)  pantoprazole (PROTONIX) EC tablet 40 mg (has no administration in time range)  ferrous sulfate tablet 325 mg (has no administration in time range)  latanoprost (XALATAN) 0.005 % ophthalmic solution 1 drop (has no administration in time range)  mupirocin cream (BACTROBAN) 2 % 1 application (has no administration in time range)  enoxaparin (LOVENOX) injection 40 mg (has no administration in time range)  0.9 %  sodium chloride infusion (has no administration in time  range)  insulin aspart (novoLOG) injection 0-9 Units (has no administration in time range)  insulin aspart (novoLOG) injection 0-5 Units (has no administration in time range)  piperacillin-tazobactam (ZOSYN) IVPB 3.375 g (has no administration in time range)  sodium chloride 0.9 % bolus 1,000 mL (0 mLs Intravenous Stopped 02/01/19 1541)  piperacillin-tazobactam (ZOSYN) IVPB 3.375 g (0 g Intravenous Stopped 02/01/19 1458)     ____________________________________________   INITIAL IMPRESSION / ASSESSMENT AND PLAN / ED COURSE  Pertinent labs & imaging results that were available during my care of the patient were reviewed by me and considered in my medical decision making (see chart for details).  Review of the [place] CSRS was performed in accordance of the [place] prior to dispensing any controlled drugs.    The patient was evaluated for the symptoms described in the history of present illness. The patient was evaluated in the context of the global COVID-19 pandemic, which necessitated consideration that the patient might be at risk for infection with the SARS-CoV-2 virus that causes COVID-19. Institutional protocols and algorithms that pertain to the evaluation of patients at risk for COVID-19 are in a state of rapid change based on information released by regulatory bodies including the CDC and federal and state organizations. The most current policies and algorithms were followed during the patient's care in the ED.     Patient's diagnosis is consistent with choledocholithiasis.  Patient presented to emergency department via EMS from [facility] urgent care.  Patient was experiencing upper abdominal pain, nausea, one episode of emesis and decreased appetite for the past week.  On presentation to urgent care, labs and imaging revealed that patient had elevated white blood cell count, elevated LFTs, obstructing gallstone in the common bile duct.  Patient was sent to the emergency department for further  evaluation.  I discussed the case with on-call GI specialist, Dr. Vicente Males who advises that the patient will be admitted for ERCP.  GI requested IV antibiotics, which were started emergency department.  Patient also had hyponatremia with a sodium of 128.  Patient is given IV fluids along with her IV antibiotics.  Patient will be admitted.    ____________________________________________  FINAL CLINICAL IMPRESSION(S) / ED DIAGNOSES  Final diagnoses:  Choledocholithiasis  Hyponatremia      NEW MEDICATIONS STARTED DURING THIS VISIT:  ED Discharge Orders    None  This chart was dictated using voice recognition software/Dragon. Despite best efforts to proofread, errors can occur which can change the meaning. Any change was purely unintentional.    Darletta Moll, PA-C 02/01/19 2113    Earleen Newport, MD 02/03/19 [phone]

## 2019-02-01 NOTE — H&P (Signed)
[facility] [place] NAME: Lori Harrell    MR#:  [id number]  [place] OF BIRTH:  [birth year]  DATE OF ADMISSION:  02/01/2019  PRIMARY CARE PHYSICIAN: Glean Hess, MD   REQUESTING/REFERRING PHYSICIAN: Lenise Arena  CHIEF COMPLAINT:   Chief Complaint  Patient presents with   Abdominal Pain    HISTORY OF PRESENT ILLNESS:  Lori Harrell  is a 78 y.o. female with a known history of hypertension, diabetes mellitus and hyperlipidemia who presented to the emergency room with at least 1 week history of upper abdominal pains.  Described pain as severe.  Had some nausea and vomiting but no diarrhea.  No fevers but reported some chills.  No chest pain.  No shortness of breath.  Patient was evaluated in the emergency room and found to have elevated liver enzymes with total bilirubin of 4.3.  CT scan of the abdomen and pelvis done reviewed 50 mm stone obstructing the distal common bile duct with dilated intra-and extrahepatic bile duct.  The gallbladder is markedly distended with multiple gallstones.  Gallbladder wall is thickened.  Patient was diagnosed with acute cholangitis and acute cholecystitis with choledocholithiasis.  Patient already seen by gastroenterologist Dr. Vicente Males in the emergency room and is scheduled for ERCP tomorrow.  Recommendation was also to consult surgery for lap cholecystectomy at some point.  Medical service called to admit patient for further evaluation and management. 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   Diabetes mellitus without complication ([place])    Hypertension     PAST SURGICAL HISTORY:   Past Surgical History:  Procedure Laterality Date   CATARACT EXTRACTION     COLONOSCOPY  2007   sigmoid diverticulosis   TONSILLECTOMY     UPPER GASTROINTESTINAL ENDOSCOPY  02/2013   Barrett's - done at [facility]    VAGINAL HYSTERECTOMY      SOCIAL HISTORY:   Social History   Tobacco Use   Smoking status: Never Smoker   Smokeless tobacco: Never Used   Tobacco comment: smoking cessation materials not required  Substance Use Topics   Alcohol use: No    Alcohol/week: 0.0 standard drinks    FAMILY HISTORY:   Family History  Problem Relation Age of Onset   Diabetes Father    CAD Father    Breast cancer Neg Hx     DRUG ALLERGIES:  No Known Allergies  REVIEW OF SYSTEMS:   Review of Systems  Constitutional: Negative for chills and fever.  HENT: Negative for hearing loss and tinnitus.   Eyes: Negative for blurred vision and double vision.  Respiratory: Negative for cough and hemoptysis.   Cardiovascular: Negative for chest pain and palpitations.  Gastrointestinal: Positive for abdominal pain, nausea and vomiting. Negative for diarrhea.  Genitourinary: Negative for dysuria.  Musculoskeletal: Negative for myalgias and neck pain.  Skin: Negative for itching and rash.  Neurological: Negative for dizziness and tingling.  Psychiatric/Behavioral: Negative for depression and substance abuse.    MEDICATIONS AT HOME:   Prior to Admission medications   Medication Sig Start Date End Date Taking? Authorizing Provider  aspirin 81 MG tablet Take 81 mg by mouth daily.    Yes [provider]  atorvastatin (LIPITOR) 20 MG tablet TAKE 1 TABLET DAILY Patient taking differently: Take 20 mg by mouth daily at 6 PM.  07/16/18  Yes Glean Hess,  MD  Calcium Carbonate-Vit D-Min (CALCIUM 1200 PO) Take by mouth. 4 times weekly   Yes [provider]  dorzolamide-timolol (COSOPT) 22.3-6.8 MG/ML ophthalmic solution Apply to eye. 11/11/18 11/11/19 Yes [provider]  enalapril (VASOTEC) 20 MG tablet TAKE 1 TABLET DAILY Patient taking differently: Take 20 mg by mouth daily.  01/20/19  Yes Glean Hess, MD  ferrous sulfate (IRON SUPPLEMENT) 325 (65 FE) MG tablet Take 1  tablet by mouth 3 (three) times a week.    Yes [provider]  Inulin (FIBER CHOICE FRUITY BITES) 1.5 g CHEW Chew by mouth.   Yes [provider]  latanoprost (XALATAN) 0.005 % ophthalmic solution Place 1 drop into both eyes at bedtime. 10/09/15  Yes [provider]  metFORMIN (GLUCOPHAGE) 500 MG tablet TAKE 1 TABLET DAILY 07/16/18  Yes Glean Hess, MD  MULTIPLE VITAMINS-MINERALS PO Take by mouth.   Yes [provider]  pantoprazole (PROTONIX) 40 MG tablet TAKE 1 TABLET DAILY 01/08/19  Yes Glean Hess, MD  spironolactone (ALDACTONE) 25 MG tablet TAKE 1 TABLET DAILY 07/16/18  Yes Glean Hess, MD  timolol (TIMOPTIC) 0.5 % ophthalmic solution  04/10/15  Yes [provider]  Acetaminophen-Caffeine (EXCEDRIN TENSION HEADACHE) 500-65 MG TABS Take by mouth 3 (three) times daily.     [provider]  FREESTYLE LITE test strip USE TO TEST BLOOD SUGAR DAILY 03/05/18   Glean Hess, MD  Lancets (FREESTYLE) lancets USE 1 LANCET DAILY 08/15/18   Glean Hess, MD  mupirocin cream (BACTROBAN) 2 % Apply 1 application topically daily. 01/07/19   Glean Hess, MD      VITAL SIGNS:  Blood pressure 136/71, pulse 96, temperature 98.2 F (36.8 C), temperature source Oral, resp. rate 16, height 5\' 1"  (1.549 m), weight 55.3 kg, SpO2 100 %.  PHYSICAL EXAMINATION:  Physical Exam  GENERAL:  78 y.o.-year-old patient lying in the bed with no acute distress.  EYES: Pupils equal, round, reactive to light and accommodation. No scleral icterus. Extraocular muscles intact.  HEENT: Head atraumatic, normocephalic. Oropharynx and nasopharynx clear.  NECK:  Supple, no jugular venous distention. No thyroid enlargement, no tenderness.  LUNGS: Normal breath sounds bilaterally, no wheezing, rales,rhonchi or crepitation. No use of accessory muscles of respiration.  CARDIOVASCULAR: S1, S2 normal. No murmurs, rubs, or gallops.  ABDOMEN: Soft, minimal  right upper quadrant tenderness but no rebound or guarding.  Bowel sounds present. No organomegaly or mass.  EXTREMITIES: No pedal edema, cyanosis, or clubbing.  NEUROLOGIC: Cranial nerves II through XII are intact. Muscle strength 5/5 in all extremities. Sensation intact. Gait not checked.  PSYCHIATRIC: The patient is alert and oriented x 3.  SKIN: No obvious rash, lesion, or ulcer.   LABORATORY PANEL:   CBC Recent Labs  Lab 02/01/19 1055  WBC 15.4*  HGB 12.0  HCT 35.1*  PLT 294   ------------------------------------------------------------------------------------------------------------------  Chemistries  Recent Labs  Lab 02/01/19 1055  NA 128*  K 3.4*  CL 96*  CO2 22  GLUCOSE 195*  BUN 24*  CREATININE 0.80  CALCIUM 9.7  AST 163*  ALT 232*  ALKPHOS 339*  BILITOT 4.3*   ------------------------------------------------------------------------------------------------------------------  Cardiac Enzymes No results for input(s): TROPONINI in the last 168 hours. ------------------------------------------------------------------------------------------------------------------  RADIOLOGY:  Ct Abdomen Pelvis Wo Contrast  Result Date: 02/01/2019 CLINICAL DATA:  Epigastric and right upper quadrant abdominal pain. EXAM: CT ABDOMEN AND PELVIS WITHOUT CONTRAST TECHNIQUE: Multidetector CT imaging of the abdomen and pelvis was performed  following the standard protocol without IV contrast. COMPARISON:  None. FINDINGS: Lower chest: The patient has a large hiatal hernia containing most of the stomach as well as portions of the transverse colon. Heart size is normal. Coronary artery calcifications. Pericardial effusion. Lung bases are clear. Hepatobiliary: Gallbladder is markedly distended thickening of the gallbladder wall. There are multiple stones in the gallbladder. There is a 15 mm stone in the distal common bile duct. There is intra and extrahepatic biliary dilatation. Pancreas:  Pancreas appears normal except for the stone in the distal common bile duct adjacent to the head of the pancreas. Spleen: Normal in size without focal abnormality. Adrenals/Urinary Tract: Adrenal glands are unremarkable. Kidneys are normal, without renal calculi, focal lesion, or hydronephrosis. Bladder is unremarkable. Stomach/Bowel: There is a huge hiatal hernia containing most of the stomach as well as portions of the transverse colon. There multiple diverticula in the distal colon without diverticulitis. Small bowel and appendix appear normal. Vascular/Lymphatic: Aortic atherosclerosis. No enlarged abdominal or pelvic lymph nodes. Reproductive: Status post hysterectomy. No adnexal masses. Other: No abdominal wall hernia or abnormality. No abdominopelvic ascites. Musculoskeletal: No acute abnormality. Lumbar scoliosis with multilevel degenerative disc disease. IMPRESSION: 1. 15 mm stone obstructing the distal common bile duct with dilated intra and extrahepatic bile ducts. The gallbladder is markedly distended 15 cm in length. Multiple stones in the gallbladder. Gallbladder wall is thickened. 2. Huge hiatal hernia containing most of the stomach and portions of the transverse colon. 3. Sigmoid diverticulosis. Critical Value/emergent results were called by telephone at the time of interpretation on 02/01/2019 at 12:05 pm to Dr. Thersa Salt , who verbally acknowledged these results. Electronically Signed   By: Lorriane Shire M.D.   On: 02/01/2019 12:07      IMPRESSION AND PLAN:   Patient is an 78 year old female with history of hypertension, diabetes mellitus and hyperlipidemia.  Presented with abdominal pains and nausea with vomiting.  Diagnosed with acute cholangitis acute cholecystitis.    1.  Acute cholangitis and acute cholecystitis Patient with elevated liver enzymes with evidence of 50 mm stone obstructing the distal common bile duct with dilated intra-and extrahepatic ducts.  Patient also with  multiple stones in the gallbladder with thickened gallbladder wall. Patient already seen by gastroenterologist.  Scheduled for ERCP in a.m. Placed on IV antibiotics with IV Zosyn.  IV fluid hydration.  Follow-up liver enzymes in a.m. Surgery consult placed.  I discussed case with Dr. Peyton Najjar who will see patient in consultation for possible laparoscopic cholecystectomy as well.  2.  Hypertension Blood pressure fairly controlled.  Monitor and adjust regimen as needed  3.  Diabetes mellitus type 2 Patient currently n.p.o.  Hold off on metformin.  Placed on sliding scale insulin coverage.  Glycosylated hemoglobin level in a.m.  4.  Hyponatremia with sodium of 128 Secondary to dehydration.  Placed on IV fluids.  Follow-up on repeat sodium level in a.m.  5.  Hyperlipidemia Continue statins.  DVT prophylaxis; Lovenox All the records are reviewed and case discussed with ED provider. Management plans discussed with the patient, family and they are in agreement.  CODE STATUS: Full code  TOTAL TIME TAKING CARE OF THIS PATIENT: 59 minutes.    Lori Harrell M.D on 02/01/2019 at 7:23 PM  Between 7am to 6pm - Pager - [phone]  After 6pm go to [email or web address] - Proofreader  [facility]  Office  [phone]  CC: Primary care physician; Glean Hess, MD   Note: This  dictation was prepared with Dragon dictation along with smaller phrase technology. Any transcriptional errors that result from this process are unintentional.

## 2019-02-01 NOTE — ED Notes (Signed)
ED TO INPATIENT HANDOFF REPORT  ED Nurse Name and Phone #: Joelene Millin [phone]  S Name/Age/Gender Lori Harrell 78 y.o. female Room/Bed: ED18A/ED18A  Code Status   Code Status: Full Code  Home/SNF/Other Home Patient oriented to: self, place, time and situation Is this baseline? Yes   Triage Complete: Triage complete  Chief Complaint abd pain  Triage Note Pt to ED via EMS from [facility] urgent care, pt states she been having abd pain for about a week, at [facility] urgent care pt was found to have 56mm common bile duct stone. Pt states pain is 4/10. Pt states she had 2 episodes of vomiting last week. Pt denies n/v now. VSS. Nad.    Allergies No Known Allergies  Level of Care/Admitting Diagnosis ED Disposition    ED Disposition Condition [facility] Area: [place] [[id number]]  Level of Care: Med-Surg [16]  Covid Evaluation: N/A  Diagnosis: Acute cholangitis [425956]  Admitting Physician: Otila Back [place]  Attending Physician: Otila Back [[id number]]  Estimated length of stay: 3 - 4 days  Certification:: I certify this patient will need inpatient services for at least 2 midnights  PT Class (Do Not Modify): Inpatient [101]  PT Acc Code (Do Not Modify): Private [1]       B Medical/Surgery History Past Medical History:  Diagnosis Date  . Acid reflux   . 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 Diabetes mellitus without complication ([place])   . Hypertension    Past Surgical History:  Procedure Laterality Date  . CATARACT EXTRACTION    . COLONOSCOPY  2007   sigmoid diverticulosis  . TONSILLECTOMY    . UPPER GASTROINTESTINAL ENDOSCOPY  02/2013   Barrett's - done at [place]       A IV Location/Drains/Wounds Patient Lines/Drains/Airways Status   Active Line/Drains/Airways    Name:   Placement date:   Placement time:   Site:   Days:   Peripheral IV 02/01/19 Left Antecubital   02/01/19    1219     Antecubital   less than 1          Intake/Output Last 24 hours  Intake/Output Summary (Last 24 hours) at 02/01/2019 1948 Last data filed at 02/01/2019 1541 Gross per 24 hour  Intake 1049 ml  Output -  Net 1049 ml    Labs/Imaging Results for orders placed or performed during the hospital encounter of 02/01/19 (from the past 48 hour(s))  Protime-INR     Status: None   Collection Time: 02/01/19  1:37 PM  Result Value Ref Range   Prothrombin Time 12.8 11.4 - 15.2 seconds   INR 1.0 0.8 - 1.2    Comment: (NOTE) INR goal varies based on device and disease states. Performed at [facility], [address] [place], [place] [address]   SARS Coronavirus 2 (CEPHEID - Performed in [facility] hospital lab), Hosp Order     Status: None   Collection Time: 02/01/19  3:10 PM  Result Value Ref Range   SARS Coronavirus 2 NEGATIVE NEGATIVE    Comment: (NOTE) If result is NEGATIVE SARS-CoV-2 target nucleic acids are NOT DETECTED. The SARS-CoV-2 RNA is generally detectable in upper and lower  respiratory specimens during the acute phase of infection. The lowest  concentration of SARS-CoV-2 viral copies this assay can detect is 250  copies / mL. A negative result does not preclude SARS-CoV-2 infection  and should not be used  as the sole basis for treatment or other  patient management decisions.  A negative result may occur with  improper specimen collection / handling, submission of specimen other  than nasopharyngeal swab, presence of viral mutation(s) within the  areas targeted by this assay, and inadequate number of viral copies  (<250 copies / mL). A negative result must be combined with clinical  observations, patient history, and epidemiological information. If result is POSITIVE SARS-CoV-2 target nucleic acids are DETECTED. The SARS-CoV-2 RNA is generally detectable in upper and lower  respiratory specimens dur ing the acute phase of infection.  Positive  results are  indicative of active infection with SARS-CoV-2.  Clinical  correlation with patient history and other diagnostic information is  necessary to determine patient infection status.  Positive results do  not rule out bacterial infection or co-infection with other viruses. If result is PRESUMPTIVE POSTIVE SARS-CoV-2 nucleic acids MAY BE PRESENT.   A presumptive positive result was obtained on the submitted specimen  and confirmed on repeat testing.  While 2019 novel coronavirus  (SARS-CoV-2) nucleic acids may be present in the submitted sample  additional confirmatory testing may be necessary for epidemiological  and / or clinical management purposes  to differentiate between  SARS-CoV-2 and other Sarbecovirus currently known to infect humans.  If clinically indicated additional testing with an alternate test  methodology [phone]) is advised. The SARS-CoV-2 RNA is generally  detectable in upper and lower respiratory sp ecimens during the acute  phase of infection. The expected result is Negative. Fact Sheet for Patients:  StrictlyIdeas.no Fact Sheet for Healthcare Providers: BankingDealers.co.za This test is not yet approved or cleared by the [place] FDA and has been authorized for detection and/or diagnosis of SARS-CoV-2 by FDA under an Emergency Use Authorization (EUA).  This EUA will remain in effect (meaning this test can be used) for the duration of the COVID-19 declaration under Section 564(b)(1) of the Act, 21 U.S.C. section 360bbb-3(b)(1), unless the authorization is terminated or revoked sooner. Performed at [facility], [address] [place], [place] [address]    Ct Abdomen Pelvis Wo Contrast  Result Date: 02/01/2019 CLINICAL DATA:  Epigastric and right upper quadrant abdominal pain. EXAM: CT ABDOMEN AND PELVIS WITHOUT CONTRAST TECHNIQUE: Multidetector CT imaging of the abdomen and pelvis was performed following  the standard protocol without IV contrast. COMPARISON:  None. FINDINGS: Lower chest: The patient has a large hiatal hernia containing most of the stomach as well as portions of the transverse colon. Heart size is normal. Coronary artery calcifications. Pericardial effusion. Lung bases are clear. Hepatobiliary: Gallbladder is markedly distended thickening of the gallbladder wall. There are multiple stones in the gallbladder. There is a 15 mm stone in the distal common bile duct. There is intra and extrahepatic biliary dilatation. Pancreas: Pancreas appears normal except for the stone in the distal common bile duct adjacent to the head of the pancreas. Spleen: Normal in size without focal abnormality. Adrenals/Urinary Tract: Adrenal glands are unremarkable. Kidneys are normal, without renal calculi, focal lesion, or hydronephrosis. Bladder is unremarkable. Stomach/Bowel: There is a huge hiatal hernia containing most of the stomach as well as portions of the transverse colon. There multiple diverticula in the distal colon without diverticulitis. Small bowel and appendix appear normal. Vascular/Lymphatic: Aortic atherosclerosis. No enlarged abdominal or pelvic lymph nodes. Reproductive: Status post hysterectomy. No adnexal masses. Other: No abdominal wall hernia or abnormality. No abdominopelvic ascites. Musculoskeletal: No acute abnormality. Lumbar scoliosis with multilevel degenerative disc disease. IMPRESSION:  1. 15 mm stone obstructing the distal common bile duct with dilated intra and extrahepatic bile ducts. The gallbladder is markedly distended 15 cm in length. Multiple stones in the gallbladder. Gallbladder wall is thickened. 2. Huge hiatal hernia containing most of the stomach and portions of the transverse colon. 3. Sigmoid diverticulosis. Critical Value/emergent results were called by telephone at the time of interpretation on 02/01/2019 at 12:05 pm to Dr. Thersa Salt , who verbally acknowledged these results.  Electronically Signed   By: Lorriane Shire M.D.   On: 02/01/2019 12:07    Pending Labs Unresulted Labs (From admission, onward)    Start     Ordered   02/02/19 0500  Hemoglobin A1c  Tomorrow morning,   STAT     02/01/19 1923   02/02/19 0500  CBC  Tomorrow morning,   STAT     02/01/19 1923   02/02/19 0500  Comprehensive metabolic panel  Tomorrow morning,   STAT     02/01/19 1923   02/02/19 0500  Magnesium  Tomorrow morning,   STAT     02/01/19 1923   02/02/19 0500  Phosphorus  Tomorrow morning,   STAT     02/01/19 1923          Vitals/Pain Today's Vitals   02/01/19 1615 02/01/19 1630 02/01/19 1645 02/01/19 1700  BP:  135/82  136/71  Pulse: 97 94 95 96  Resp:      Temp:      TempSrc:      SpO2: 100% 100% 100% 100%  Weight:      Height:      PainSc:        Isolation Precautions No active isolations  Medications Medications  Acetaminophen-Caffeine 500-65 MG TABS (has no administration in time range)  enalapril (VASOTEC) tablet 20 mg (has no administration in time range)  spironolactone (ALDACTONE) tablet 25 mg (has no administration in time range)  pantoprazole (PROTONIX) EC tablet 40 mg (has no administration in time range)  ferrous sulfate tablet 325 mg (has no administration in time range)  latanoprost (XALATAN) 0.005 % ophthalmic solution 1 drop (has no administration in time range)  mupirocin cream (BACTROBAN) 2 % 1 application (has no administration in time range)  enoxaparin (LOVENOX) injection 40 mg (has no administration in time range)  0.9 %  sodium chloride infusion (has no administration in time range)  insulin aspart (novoLOG) injection 0-9 Units (has no administration in time range)  insulin aspart (novoLOG) injection 0-5 Units (has no administration in time range)  piperacillin-tazobactam (ZOSYN) IVPB 3.375 g (has no administration in time range)  sodium chloride 0.9 % bolus 1,000 mL (0 mLs Intravenous Stopped 02/01/19 1541)  piperacillin-tazobactam  (ZOSYN) IVPB 3.375 g (0 g Intravenous Stopped 02/01/19 1458)    Mobility walks Low fall risk   Focused Assessments Gall Bladder   R Recommendations: See Admitting Provider Note  Report given to:   Additional Notes:

## 2019-02-01 NOTE — ED Provider Notes (Addendum)
[facility]    CSN: [id number] Arrival date & time: 02/01/19  1008  History   Chief Complaint Chief Complaint  Patient presents with  . Abdominal Pain   HPI   78 year old female presents with abdominal pain.  Patient reports a one-week to 10-day history of upper abdominal pain.  Located throughout the upper abdomen.  Pain is severe.  She states that on Monday of last week she had some nausea, vomiting, and chills.  No documented fever.  She had additional episode of vomiting on Saturday.  She reports ongoing decreased appetite and early satiety.  She states that this is been going on for the past month.  She did eat earlier today.  She ate a pop tart this morning.  Patient was unsure if this was related to reflux as she has known hiatal hernia, Barrett's esophagus, and reflux.  She endorses compliance with her home medication.  No relieving factors.  No other associated symptoms.  No other complaints.  Hx reviewed as below. Past Medical History:  Diagnosis Date  . Acid reflux   . 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 Diabetes mellitus without complication ([place])   . Hypertension     Patient Active Problem List   Diagnosis Date Noted  . Unstageable pressure ulcer of back ([place]) 01/07/2019  . DDD (degenerative disc disease), lumbar 01/07/2019  . Carotid artery stenosis, asymptomatic, bilateral 07/07/2017  . Primary open angle glaucoma 05/08/2017  . 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 Large hiatal hernia 07/04/2016  . Sigmoid diverticulitis 07/04/2016  . Stress incontinence 07/04/2016  . Type II diabetes mellitus with manifestations ([place]) 10/31/2015  . Constipation 10/31/2015  . 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 Essential (primary) hypertension 06/16/2015  . Hyperlipidemia associated with type 2 diabetes mellitus ([place]) 06/16/2015  . OP (osteoporosis) 06/16/2015  . Hypercalcemia 04/22/2012    Past Surgical History:  Procedure Laterality Date   . CATARACT EXTRACTION    . COLONOSCOPY  2007   sigmoid diverticulosis  . TONSILLECTOMY    . UPPER GASTROINTESTINAL ENDOSCOPY  02/2013   Barrett's - done at [facility]      OB History   No obstetric history on file.      Home Medications    Prior to Admission medications   Medication Sig Start Date End Date Taking? Authorizing Provider  Acetaminophen-Caffeine (EXCEDRIN TENSION HEADACHE) 500-65 MG TABS Take by mouth 3 (three) times daily.    Yes [provider]  aspirin 81 MG tablet Take 81 mg by mouth daily.    Yes [provider]  atorvastatin (LIPITOR) 20 MG tablet TAKE 1 TABLET DAILY Patient taking differently: Take 20 mg by mouth daily at 6 PM.  07/16/18  Yes Glean Hess, MD  Calcium Carbonate-Vit D-Min (CALCIUM 1200 PO) Take by mouth. 4 times weekly   Yes [provider]  dorzolamide-timolol (COSOPT) 22.3-6.8 MG/ML ophthalmic solution Apply to eye. 11/11/18 11/11/19 Yes [provider]  enalapril (VASOTEC) 20 MG tablet TAKE 1 TABLET DAILY Patient taking differently: Take 20 mg by mouth daily.  01/20/19  Yes Glean Hess, MD  ferrous sulfate (IRON SUPPLEMENT) 325 (65 FE) MG tablet Take 1 tablet by mouth 3 (three) times a week.    Yes [provider]  FREESTYLE LITE test strip USE TO TEST BLOOD SUGAR DAILY 03/05/18  Yes Glean Hess, MD  Inulin (FIBER CHOICE FRUITY BITES) 1.5 g CHEW Chew by mouth.   Yes  [provider]  Lancets (FREESTYLE) lancets USE 1 LANCET DAILY 08/15/18  Yes Glean Hess, MD  latanoprost (XALATAN) 0.005 % ophthalmic solution Place 1 drop into both eyes at bedtime. 10/09/15  Yes [provider]  metFORMIN (GLUCOPHAGE) 500 MG tablet TAKE 1 TABLET DAILY 07/16/18  Yes Glean Hess, MD  MULTIPLE VITAMINS-MINERALS PO Take by mouth.   Yes [provider]  mupirocin cream (BACTROBAN) 2 % Apply 1 application topically daily. 01/07/19  Yes Glean Hess, MD   pantoprazole (PROTONIX) 40 MG tablet TAKE 1 TABLET DAILY 01/08/19  Yes Glean Hess, MD  spironolactone (ALDACTONE) 25 MG tablet TAKE 1 TABLET DAILY 07/16/18  Yes Glean Hess, MD  timolol (TIMOPTIC) 0.5 % ophthalmic solution  04/10/15  Yes [provider]    Family History Family History  Problem Relation Age of Onset  . Diabetes Father   . CAD Father   . Breast cancer Neg Hx     Social History Social History   Tobacco Use  . Smoking status: Never Smoker  . Smokeless tobacco: Never Used  . Tobacco comment: smoking cessation materials not required  Substance Use Topics  . Alcohol use: No    Alcohol/week: 0.0 standard drinks  . Drug use: No     Allergies   Patient has no known allergies.   Review of Systems Review of Systems  Constitutional: Positive for appetite change and chills.  Gastrointestinal: Positive for abdominal pain, nausea and vomiting. Negative for blood in stool.   Physical Exam Triage Vital Signs ED Triage Vitals  Enc Vitals Group     BP 02/01/19 1037 129/84     Pulse Rate 02/01/19 1037 92     Resp 02/01/19 1037 18     Temp 02/01/19 1037 97.7 F (36.5 C)     Temp Source 02/01/19 1037 Oral     SpO2 02/01/19 1037 100 %     Weight 02/01/19 1033 122 lb (55.3 kg)     Height 02/01/19 1033 5\' 1"  (1.549 m)     Head Circumference --      Peak Flow --      Pain Score 02/01/19 1032 7     Pain Loc --      Pain Edu? --      Excl. in [place]? --    Updated Vital Signs BP 129/84 (BP Location: Left Arm)   Pulse 92   Temp 97.7 F (36.5 C) (Oral)   Resp 18   Ht 5\' 1"  (1.549 m)   Wt 55.3 kg   SpO2 100%   BMI 23.05 kg/m   Visual Acuity Right Eye Distance:   Left Eye Distance:   Bilateral Distance:    Right Eye Near:   Left Eye Near:    Bilateral Near:     Physical Exam Vitals signs and nursing note reviewed.  Constitutional:      Comments: Frail, elderly female. Appears unwell but is in no acute distress.  HENT:     Head:  Normocephalic and atraumatic.  Eyes:     Comments: No apparent scleral icterus.  Cardiovascular:     Rate and Rhythm: Normal rate and regular rhythm.  Pulmonary:     Effort: Pulmonary effort is normal. No respiratory distress.     Breath sounds: Normal breath sounds.  Abdominal:     Palpations: Abdomen is soft.     Comments: Nondistended.  Exquisitely tender to palpation in the epigastric region as well as right  upper quadrant.  Neurological:     Mental Status: She is alert.  Psychiatric:        Mood and Affect: Mood normal.        Behavior: Behavior normal.    UC Treatments / Results  Labs (all labs ordered are listed, but only abnormal results are displayed) Labs Reviewed  CBC WITH DIFFERENTIAL/PLATELET - Abnormal; Notable for the following components:      Result Value   WBC 15.4 (*)    RBC 3.65 (*)    HCT 35.1 (*)    Neutro Abs 13.4 (*)    Abs Immature Granulocytes 0.25 (*)    All other components within normal limits  COMPREHENSIVE METABOLIC PANEL - Abnormal; Notable for the following components:   Sodium 128 (*)    Potassium 3.4 (*)    Chloride 96 (*)    Glucose, Bld 195 (*)    BUN 24 (*)    Albumin 3.3 (*)    AST 163 (*)    ALT 232 (*)    Alkaline Phosphatase 339 (*)    Total Bilirubin 4.3 (*)    All other components within normal limits  LIPASE, BLOOD    EKG None  Radiology Ct Abdomen Pelvis Wo Contrast  Result Date: 02/01/2019 CLINICAL DATA:  Epigastric and right upper quadrant abdominal pain. EXAM: CT ABDOMEN AND PELVIS WITHOUT CONTRAST TECHNIQUE: Multidetector CT imaging of the abdomen and pelvis was performed following the standard protocol without IV contrast. COMPARISON:  None. FINDINGS: Lower chest: The patient has a large hiatal hernia containing most of the stomach as well as portions of the transverse colon. Heart size is normal. Coronary artery calcifications. Pericardial effusion. Lung bases are clear. Hepatobiliary: Gallbladder is markedly  distended thickening of the gallbladder wall. There are multiple stones in the gallbladder. There is a 15 mm stone in the distal common bile duct. There is intra and extrahepatic biliary dilatation. Pancreas: Pancreas appears normal except for the stone in the distal common bile duct adjacent to the head of the pancreas. Spleen: Normal in size without focal abnormality. Adrenals/Urinary Tract: Adrenal glands are unremarkable. Kidneys are normal, without renal calculi, focal lesion, or hydronephrosis. Bladder is unremarkable. Stomach/Bowel: There is a huge hiatal hernia containing most of the stomach as well as portions of the transverse colon. There multiple diverticula in the distal colon without diverticulitis. Small bowel and appendix appear normal. Vascular/Lymphatic: Aortic atherosclerosis. No enlarged abdominal or pelvic lymph nodes. Reproductive: Status post hysterectomy. No adnexal masses. Other: No abdominal wall hernia or abnormality. No abdominopelvic ascites. Musculoskeletal: No acute abnormality. Lumbar scoliosis with multilevel degenerative disc disease. IMPRESSION: 1. 15 mm stone obstructing the distal common bile duct with dilated intra and extrahepatic bile ducts. The gallbladder is markedly distended 15 cm in length. Multiple stones in the gallbladder. Gallbladder wall is thickened. 2. Huge hiatal hernia containing most of the stomach and portions of the transverse colon. 3. Sigmoid diverticulosis. Critical Value/emergent results were called by telephone at the time of interpretation on 02/01/2019 at 12:05 pm to Dr. Thersa Salt , who verbally acknowledged these results. Electronically Signed   By: Lorriane Shire M.D.   On: 02/01/2019 12:07    Procedures Procedures (including critical care time)  Medications Ordered in UC Medications - No data to display  Initial Impression / Assessment and Plan / UC Course  I have reviewed the triage vital signs and the nursing notes.  Pertinent labs &  imaging results that were available during my care  of the patient were reviewed by me and considered in my medical decision making (see chart for details).    78 year old female presents with ongoing severe upper abdominal pain, nausea, vomiting, and early satiety.  Laboratory studies revealed hyponatremia at 128.  Elevated alkaline phosphatase at 339 as well as elevated AST and ALT (163 and 232 respectively).  Bilirubin 4.3.  Elevated white blood cell count at 15.4.  CT scan of the abdomen and pelvis was obtained and revealed a markedly distended gallbladder with gallbladder wall thickening, and a large 15 mm common bile duct stone.  IV placed.  Patient being transported via EMS to the hospital for further management.  Final Clinical Impressions(s) / UC Diagnoses   Final diagnoses:  Choledocholithiasis  Elevated bilirubin  Elevated LFTs   Discharge Instructions   None    ED Prescriptions    None     Controlled Substance Prescriptions [place] Controlled Substance Registry consulted? Not Applicable   Coral Spikes, DO 02/01/19 1231    Thersa Salt G, DO 02/01/19 1356

## 2019-02-01 NOTE — Progress Notes (Signed)
Care Alignment Note  Advanced Directives Documents (Living Will, Power of Attorney) currently in the EHR no advanced directives documents available .  Has the patient discussed their wishes with their family/healthcare power of attorney no.  What does the patient/decision maker understand about their medical condition and the natural course of their disease.  Acute cholangitis.  Acute cholecystitis.  Hypertension.  Diabetes mellitus.  Hyperlipidemia.  What is the patient/decision maker's biggest fear or concern for the future pain and suffering   What is the most important goal for this patient should their health condition worsen maintenance of function.  Current   Code Status: Full Code  Current code status has been reviewed/updated.  Time spent:23 minutes.

## 2019-02-01 NOTE — ED Triage Notes (Addendum)
Patient complains of abdominal pain x 10 days. Patient states that she has no appetite and has been vomiting. Patient states that this pain occurs in upper abdomen and is moves around from side to side. Scheduled for Endoscopy in July for reflux.

## 2019-02-02 ENCOUNTER — Ambulatory Visit (HOSPITAL_COMMUNITY)
Admission: AD | Admit: 2019-02-02 | Discharge: 2019-02-02 | Disposition: A | Discharge status: Home / Self Care | Payer: Medicare Other | Source: Other Acute Inpatient Hospital | Attending: Specialist | Admitting: Specialist

## 2019-02-02 ENCOUNTER — Inpatient Hospital Stay: Payer: Medicare Other | Admitting: Anesthesiology

## 2019-02-02 ENCOUNTER — Inpatient Hospital Stay: Payer: Medicare Other

## 2019-02-02 ENCOUNTER — Encounter
Admission: EM | Disposition: A | Discharge status: Other Acute Inpatient Hospital | Payer: Self-pay | Source: Home / Self Care | Attending: Specialist

## 2019-02-02 ENCOUNTER — Encounter: Payer: Self-pay | Admitting: *Deleted

## 2019-02-02 DIAGNOSIS — H409 Unspecified glaucoma: Secondary | ICD-10-CM | POA: Diagnosis present

## 2019-02-02 DIAGNOSIS — R109 Unspecified abdominal pain: Secondary | ICD-10-CM | POA: Diagnosis not present

## 2019-02-02 DIAGNOSIS — Z9889 Other specified postprocedural states: Secondary | ICD-10-CM | POA: Diagnosis not present

## 2019-02-02 DIAGNOSIS — Z7982 Long term (current) use of aspirin: Secondary | ICD-10-CM | POA: Diagnosis not present

## 2019-02-02 DIAGNOSIS — K828 Other specified diseases of gallbladder: Secondary | ICD-10-CM | POA: Diagnosis present

## 2019-02-02 DIAGNOSIS — R768 Other specified abnormal immunological findings in serum: Secondary | ICD-10-CM | POA: Diagnosis not present

## 2019-02-02 DIAGNOSIS — K8001 Calculus of gallbladder with acute cholecystitis with obstruction: Secondary | ICD-10-CM | POA: Diagnosis not present

## 2019-02-02 DIAGNOSIS — K838 Other specified diseases of biliary tract: Secondary | ICD-10-CM | POA: Diagnosis not present

## 2019-02-02 DIAGNOSIS — Z1159 Encounter for screening for other viral diseases: Secondary | ICD-10-CM | POA: Diagnosis not present

## 2019-02-02 DIAGNOSIS — E860 Dehydration: Secondary | ICD-10-CM | POA: Diagnosis not present

## 2019-02-02 DIAGNOSIS — K810 Acute cholecystitis: Secondary | ICD-10-CM | POA: Diagnosis not present

## 2019-02-02 DIAGNOSIS — K8040 Calculus of bile duct with cholecystitis, unspecified, without obstruction: Secondary | ICD-10-CM | POA: Diagnosis not present

## 2019-02-02 DIAGNOSIS — K8050 Calculus of bile duct without cholangitis or cholecystitis without obstruction: Secondary | ICD-10-CM

## 2019-02-02 DIAGNOSIS — K5710 Diverticulosis of small intestine without perforation or abscess without bleeding: Secondary | ICD-10-CM | POA: Diagnosis present

## 2019-02-02 DIAGNOSIS — I10 Essential (primary) hypertension: Secondary | ICD-10-CM | POA: Diagnosis present

## 2019-02-02 DIAGNOSIS — D649 Anemia, unspecified: Secondary | ICD-10-CM | POA: Diagnosis present

## 2019-02-02 DIAGNOSIS — R6889 Other general symptoms and signs: Secondary | ICD-10-CM | POA: Diagnosis not present

## 2019-02-02 DIAGNOSIS — E118 Type 2 diabetes mellitus with unspecified complications: Secondary | ICD-10-CM | POA: Diagnosis not present

## 2019-02-02 DIAGNOSIS — K2270 Barrett's esophagus without dysplasia: Secondary | ICD-10-CM | POA: Diagnosis not present

## 2019-02-02 DIAGNOSIS — K8020 Calculus of gallbladder without cholecystitis without obstruction: Secondary | ICD-10-CM | POA: Diagnosis not present

## 2019-02-02 DIAGNOSIS — E7800 Pure hypercholesterolemia, unspecified: Secondary | ICD-10-CM | POA: Diagnosis present

## 2019-02-02 DIAGNOSIS — Z7984 Long term (current) use of oral hypoglycemic drugs: Secondary | ICD-10-CM | POA: Diagnosis not present

## 2019-02-02 DIAGNOSIS — K449 Diaphragmatic hernia without obstruction or gangrene: Secondary | ICD-10-CM | POA: Diagnosis present

## 2019-02-02 DIAGNOSIS — K8071 Calculus of gallbladder and bile duct without cholecystitis with obstruction: Secondary | ICD-10-CM | POA: Diagnosis not present

## 2019-02-02 DIAGNOSIS — E119 Type 2 diabetes mellitus without complications: Secondary | ICD-10-CM | POA: Diagnosis present

## 2019-02-02 DIAGNOSIS — E785 Hyperlipidemia, unspecified: Secondary | ICD-10-CM | POA: Diagnosis present

## 2019-02-02 DIAGNOSIS — Z90710 Acquired absence of both cervix and uterus: Secondary | ICD-10-CM | POA: Diagnosis not present

## 2019-02-02 DIAGNOSIS — K219 Gastro-esophageal reflux disease without esophagitis: Secondary | ICD-10-CM | POA: Diagnosis not present

## 2019-02-02 DIAGNOSIS — Z20828 Contact with and (suspected) exposure to other viral communicable diseases: Secondary | ICD-10-CM | POA: Diagnosis present

## 2019-02-02 DIAGNOSIS — K8042 Calculus of bile duct with acute cholecystitis without obstruction: Secondary | ICD-10-CM | POA: Diagnosis present

## 2019-02-02 DIAGNOSIS — E871 Hypo-osmolality and hyponatremia: Secondary | ICD-10-CM | POA: Diagnosis not present

## 2019-02-02 DIAGNOSIS — K8043 Calculus of bile duct with acute cholecystitis with obstruction: Secondary | ICD-10-CM | POA: Diagnosis not present

## 2019-02-02 DIAGNOSIS — K812 Acute cholecystitis with chronic cholecystitis: Secondary | ICD-10-CM | POA: Diagnosis not present

## 2019-02-02 HISTORY — PX: ENDOSCOPIC RETROGRADE CHOLANGIOPANCREATOGRAPHY (ERCP) WITH PROPOFOL: SHX5810

## 2019-02-02 LAB — COMPREHENSIVE METABOLIC PANEL
ALT: 201 U/L — ABNORMAL HIGH (ref 0–44)
AST: 150 U/L — ABNORMAL HIGH (ref 15–41)
Albumin: 2.7 g/dL — ABNORMAL LOW (ref 3.5–5.0)
Alkaline Phosphatase: 312 U/L — ABNORMAL HIGH (ref 38–126)
Anion gap: 10 (ref 5–15)
BUN: 14 mg/dL (ref 8–23)
CO2: 20 mmol/L — ABNORMAL LOW (ref 22–32)
Calcium: 8.4 mg/dL — ABNORMAL LOW (ref 8.9–10.3)
Chloride: 106 mmol/L (ref 98–111)
Creatinine, Ser: 0.45 mg/dL (ref 0.44–1.00)
GFR calc Af Amer: >60 mL/min (ref >60)
GFR calc non Af Amer: >60 mL/min (ref >60)
Glucose, Bld: 108 mg/dL — ABNORMAL HIGH (ref 70–99)
Potassium: 3.1 mmol/L — ABNORMAL LOW (ref 3.5–5.1)
Sodium: 136 mmol/L (ref 135–145)
Total Bilirubin: 4.1 mg/dL — ABNORMAL HIGH (ref 0.3–1.2)
Total Protein: 5.5 g/dL — ABNORMAL LOW (ref 6.5–8.1)

## 2019-02-02 LAB — CBC
HCT: 30.5 % — ABNORMAL LOW (ref 36.0–46.0)
Hemoglobin: 10.3 g/dL — ABNORMAL LOW (ref 12.0–15.0)
MCH: 31.9 pg (ref 26.0–34.0)
MCHC: 33.8 g/dL (ref 30.0–36.0)
MCV: 94.4 fL (ref 80.0–100.0)
Platelets: 244 10*3/uL (ref 150–400)
RBC: 3.23 MIL/uL — ABNORMAL LOW (ref 3.87–5.11)
RDW: 13.2 % (ref 11.5–15.5)
WBC: 11.4 10*3/uL — ABNORMAL HIGH (ref 4.0–10.5)
nRBC: 0 % (ref 0.0–0.2)

## 2019-02-02 LAB — HEMOGLOBIN A1C
Hgb A1c MFr Bld: 6.2 % — ABNORMAL HIGH (ref 4.8–5.6)
Mean Plasma Glucose: 131.24 mg/dL

## 2019-02-02 LAB — GLUCOSE, CAPILLARY
Glucose-Capillary: 101 mg/dL — ABNORMAL HIGH (ref 70–99)
Glucose-Capillary: 80 mg/dL (ref 70–99)
Glucose-Capillary: 102 mg/dL — ABNORMAL HIGH (ref 70–99)

## 2019-02-02 LAB — MAGNESIUM: Magnesium: 1.4 mg/dL — ABNORMAL LOW (ref 1.7–2.4)

## 2019-02-02 LAB — PHOSPHORUS: Phosphorus: 2.5 mg/dL (ref 2.5–4.6)

## 2019-02-02 SURGERY — ENDOSCOPIC RETROGRADE CHOLANGIOPANCREATOGRAPHY (ERCP) WITH PROPOFOL
Anesthesia: General

## 2019-02-02 MED ORDER — TIMOLOL MALEATE 0.5 % OP SOLN
1.0000 [drp] | Freq: Two times a day (BID) | OPHTHALMIC | Status: DC
  Administered 2019-02-02: 1 [drp] via OPHTHALMIC
  Filled 2019-02-02: qty 5

## 2019-02-02 MED ORDER — PROPOFOL 10 MG/ML IV BOLUS
INTRAVENOUS | Status: DC | PRN
  Administered 2019-02-02: 70 mg via INTRAVENOUS

## 2019-02-02 MED ORDER — SODIUM CHLORIDE 0.9 % IV SOLN
INTRAVENOUS | Status: DC
  Administered 2019-02-02: 14:00:00 via INTRAVENOUS

## 2019-02-02 MED ORDER — PIPERACILLIN-TAZOBACTAM 3.375 G IVPB: 3.3750 g | Freq: Three times a day (TID) | INTRAVENOUS | Status: DC

## 2019-02-02 MED ORDER — DORZOLAMIDE HCL 2 % OP SOLN
1.0000 [drp] | Freq: Two times a day (BID) | OPHTHALMIC | Status: DC
  Administered 2019-02-02: 1 [drp] via OPHTHALMIC
  Filled 2019-02-02: qty 10

## 2019-02-02 MED ORDER — LACTATED RINGERS IV SOLN
INTRAVENOUS | Status: DC
  Administered 2019-02-02: 14:00:00 via INTRAVENOUS

## 2019-02-02 MED ORDER — PROPOFOL 500 MG/50ML IV EMUL
INTRAVENOUS | Status: DC | PRN
  Administered 2019-02-02: 100 ug/kg/min via INTRAVENOUS

## 2019-02-02 MED ORDER — POTASSIUM CHLORIDE CRYS ER 20 MEQ PO TBCR
40.0000 meq | EXTENDED_RELEASE_TABLET | Freq: Once | ORAL | Status: AC
  Administered 2019-02-02: 40 meq via ORAL
  Filled 2019-02-02: qty 2

## 2019-02-02 MED ORDER — LIDOCAINE 2% (20 MG/ML) 5 ML SYRINGE
INTRAMUSCULAR | Status: DC | PRN
  Administered 2019-02-02: 25 mg via INTRAVENOUS

## 2019-02-02 MED ORDER — INDOMETHACIN 50 MG RE SUPP
100.0000 mg | Freq: Once | RECTAL | Status: AC
  Administered 2019-02-02: 14:00:00 100 mg via RECTAL

## 2019-02-02 MED ORDER — INDOMETHACIN 50 MG RE SUPP
RECTAL | Status: AC
  Administered 2019-02-02: 100 mg via RECTAL
  Filled 2019-02-02: qty 2

## 2019-02-02 MED ORDER — MAGNESIUM SULFATE IN D5W 1-5 GM/100ML-% IV SOLN
1.0000 g | Freq: Once | INTRAVENOUS | Status: AC
  Administered 2019-02-02: 1 g via INTRAVENOUS
  Filled 2019-02-02 (×2): qty 100

## 2019-02-02 NOTE — Progress Notes (Signed)
[facility] [place] NAME: Lori Harrell    MR#:  [id number]  DATE OF BIRTH:  Feb 23, 1941  SUBJECTIVE:   Patient presented to the hospital secondary to abdominal pain from choledocholithiasis/acute cholecystitis.  Patient still having some abdominal pain but improved since yesterday.  No nausea or vomiting.  Afebrile.  Plan for ERCP today.  REVIEW OF SYSTEMS:    Review of Systems  Constitutional: Negative for chills and fever.  HENT: Negative for congestion and tinnitus.   Eyes: Negative for blurred vision and double vision.  Respiratory: Negative for cough, shortness of breath and wheezing.   Cardiovascular: Negative for chest pain, orthopnea and PND.  Gastrointestinal: Positive for abdominal pain. Negative for diarrhea, nausea and vomiting.  Genitourinary: Negative for dysuria and hematuria.  Neurological: Negative for dizziness, sensory change and focal weakness.  All other systems reviewed and are negative.   Nutrition: NPO Tolerating Diet: No Tolerating PT: Ambulatory   DRUG ALLERGIES:  No Known Allergies  VITALS:  Blood pressure 134/79, pulse 86, temperature 97.9 F (36.6 C), temperature source Oral, resp. rate 18, height 5\' 1"  (1.549 m), weight 55.3 kg, SpO2 99 %.  PHYSICAL EXAMINATION:   Physical Exam  GENERAL:  78 y.o.-year-old patient lying in bed in no acute distress.  EYES: Pupils equal, round, reactive to light and accommodation. No scleral icterus. Extraocular muscles intact.  HEENT: Head atraumatic, normocephalic. Oropharynx and nasopharynx clear.  NECK:  Supple, no jugular venous distention. No thyroid enlargement, no tenderness.  LUNGS: Normal breath sounds bilaterally, no wheezing, rales, rhonchi. No use of accessory muscles of respiration.  CARDIOVASCULAR: S1, S2 normal. No murmurs, rubs, or gallops.  ABDOMEN: Soft, tender in RUQ/epigatric area, nondistended. Bowel sounds present. No organomegaly or mass.   EXTREMITIES: No cyanosis, clubbing or edema b/l.    NEUROLOGIC: Cranial nerves II through XII are intact. No focal Motor or sensory deficits b/l.   PSYCHIATRIC: The patient is alert and oriented x 3.  SKIN: No obvious rash, lesion, or ulcer.    LABORATORY PANEL:   CBC Recent Labs  Lab 02/02/19 0426  WBC 11.4*  HGB 10.3*  HCT 30.5*  PLT 244   ------------------------------------------------------------------------------------------------------------------  Chemistries  Recent Labs  Lab 02/02/19 0426  NA 136  K 3.1*  CL 106  CO2 20*  GLUCOSE 108*  BUN 14  CREATININE 0.45  CALCIUM 8.4*  MG 1.4*  AST 150*  ALT 201*  ALKPHOS 312*  BILITOT 4.1*   ------------------------------------------------------------------------------------------------------------------  Cardiac Enzymes No results for input(s): TROPONINI in the last 168 hours. ------------------------------------------------------------------------------------------------------------------  RADIOLOGY:  Ct Abdomen Pelvis Wo Contrast  Result Date: 02/01/2019 CLINICAL DATA:  Epigastric and right upper quadrant abdominal pain. EXAM: CT ABDOMEN AND PELVIS WITHOUT CONTRAST TECHNIQUE: Multidetector CT imaging of the abdomen and pelvis was performed following the standard protocol without IV contrast. COMPARISON:  None. FINDINGS: Lower chest: The patient has a large hiatal hernia containing most of the stomach as well as portions of the transverse colon. Heart size is normal. Coronary artery calcifications. Pericardial effusion. Lung bases are clear. Hepatobiliary: Gallbladder is markedly distended thickening of the gallbladder wall. There are multiple stones in the gallbladder. There is a 15 mm stone in the distal common bile duct. There is intra and extrahepatic biliary dilatation. Pancreas: Pancreas appears normal except for the stone in the distal common bile duct adjacent to the head of the pancreas. Spleen: Normal in size  without focal abnormality. Adrenals/Urinary Tract: Adrenal  glands are unremarkable. Kidneys are normal, without renal calculi, focal lesion, or hydronephrosis. Bladder is unremarkable. Stomach/Bowel: There is a huge hiatal hernia containing most of the stomach as well as portions of the transverse colon. There multiple diverticula in the distal colon without diverticulitis. Small bowel and appendix appear normal. Vascular/Lymphatic: Aortic atherosclerosis. No enlarged abdominal or pelvic lymph nodes. Reproductive: Status post hysterectomy. No adnexal masses. Other: No abdominal wall hernia or abnormality. No abdominopelvic ascites. Musculoskeletal: No acute abnormality. Lumbar scoliosis with multilevel degenerative disc disease. IMPRESSION: 1. 15 mm stone obstructing the distal common bile duct with dilated intra and extrahepatic bile ducts. The gallbladder is markedly distended 15 cm in length. Multiple stones in the gallbladder. Gallbladder wall is thickened. 2. Huge hiatal hernia containing most of the stomach and portions of the transverse colon. 3. Sigmoid diverticulosis. Critical Value/emergent results were called by telephone at the time of interpretation on 02/01/2019 at 12:05 pm to Dr. Thersa Salt , who verbally acknowledged these results. Electronically Signed   By: Lorriane Shire M.D.   On: 02/01/2019 12:07     ASSESSMENT AND PLAN:   78 year old female with past medical history of hypertension diabetes, GERD, history of avascular necrosis who presented to the hospital due to abdominal pain and noted to have choledocholithiasis with acute cholecystitis.  1.  Acute cholecystitis with choledocholithiasis-this was the cause of patient's abdominal pain nausea vomiting on admission. - Seen by gastroenterology and plan for ERCP today. -Await surgical input as patient may also need laparoscopic cholecystectomy in the near future. - Continue empiric Zosyn, supportive care with IV fluids, antiemetics pain  control.  2.  Abnormal LFTs-secondary to #1.  - improving and will cont. To monitor.   3. HTN - BP stable.  - cont. Vasotec, Aldactone.   4. DM type II without complication - cont. SSI and follow BS.  5. GERD - cont. Protonix.   6. Glaucoma - cont. Xalatan, Timolol eye drops.    All the records are reviewed and case discussed with Care Management/Social Worker. Management plans discussed with the patient, family and they are in agreement.  CODE STATUS: Full code  DVT Prophylaxis: Ted's & SCD's  TOTAL TIME TAKING CARE OF THIS PATIENT: 30 minutes.   POSSIBLE D/C IN 2-3 DAYS, DEPENDING ON CLINICAL CONDITION.   Henreitta Leber M.D on 02/02/2019 at 12:02 PM  Between 7am to 6pm - Pager - [phone]  After 6pm go to [email or web address] - Proofreader  [facility]  Office  [phone]  CC: Primary care physician; Glean Hess, MD

## 2019-02-02 NOTE — Anesthesia Post-op Follow-up Note (Signed)
Anesthesia QCDR form completed.        

## 2019-02-02 NOTE — Anesthesia Preprocedure Evaluation (Addendum)
Anesthesia Evaluation  Patient identified by MRN, date of birth, ID band Patient awake    Reviewed: Allergy & Precautions, H&P , NPO status , Patient's Chart, lab work & pertinent test results  History of Anesthesia Complications Negative for: history of anesthetic complications  Airway Mallampati: III  TM Distance: >3 FB Neck ROM: limited    Dental  (+) Chipped   Pulmonary neg pulmonary ROS, neg shortness of breath,           Cardiovascular Exercise Tolerance: Good hypertension, (-) angina(-) Past MI and (-) DOE      Neuro/Psych negative neurological ROS  negative psych ROS   GI/Hepatic Neg liver ROS, hiatal hernia, GERD  Medicated and Controlled,  Endo/Other  diabetes, Type 2, Oral Hypoglycemic Agents  Renal/GU negative Renal ROS  negative genitourinary   Musculoskeletal  (+) Arthritis ,   Abdominal   Peds  Hematology negative hematology ROS (+)   Anesthesia Other Findings Patient is NPO appropriate and reports no nausea or vomiting today.  Past Medical History: No date: Acid reflux 07/07/2017: AVN (avascular necrosis of bone) ([facility]) No date: Diabetes mellitus without complication ([facility]) No date: Hypertension  Past Surgical History: No date: CATARACT EXTRACTION 2007: COLONOSCOPY     Comment:  sigmoid diverticulosis No date: TONSILLECTOMY 02/2013: UPPER GASTROINTESTINAL ENDOSCOPY     Comment:  Barrett's - done at [facility] No date: VAGINAL HYSTERECTOMY  BMI    Body Mass Index:  23.04 kg/m      Reproductive/Obstetrics negative OB ROS                            Anesthesia Physical Anesthesia Plan  ASA: III  Anesthesia Plan: General   Post-op Pain Management:    Induction: Intravenous  PONV Risk Score and Plan: Propofol infusion and TIVA  Airway Management Planned: Natural Airway and Nasal Cannula  Additional Equipment:   Intra-op Plan:   Post-operative Plan:    Informed Consent: I have reviewed the patients History and Physical, chart, labs and discussed the procedure including the risks, benefits and alternatives for the proposed anesthesia with the patient or authorized representative who has indicated his/her understanding and acceptance.     Dental Advisory Given  Plan Discussed with: Anesthesiologist, CRNA and Surgeon  Anesthesia Plan Comments: (Patient consented for risks of anesthesia including but not limited to:  - adverse reactions to medications - risk of intubation if required - damage to teeth, lips or other oral mucosa - sore throat or hoarseness - Damage to heart, brain, lungs or loss of life  Patient voiced understanding.)        Anesthesia Quick Evaluation

## 2019-02-02 NOTE — OR Nursing (Signed)
Report to [place]. ERCP ABORTED. PT TRANSFERRED BACK TO FLOOR AFTER FLAT AND UPRIGHT DONE

## 2019-02-02 NOTE — Op Note (Signed)
[facility] Patient Name: Lori Harrell Procedure Date: 02/02/2019 1:57 PM MRN: [id number] Account #: [id number] Date of Birth: 20-Apr-1941 Admit Type: Inpatient Age: 78 Room: [facility] ENDO ROOM 4 Gender: Female Note Status: Finalized Procedure:            ERCP Indications:          Common bile duct stone(s), PRevious EGD reported not to                        be able to get into the duodenum due to anatomy in                        [place], [place] Providers:            Lucilla Lame MD, MD Referring MD:         Halina Maidens, MD (Referring MD) Complications:        No immediate complications. Procedure:            Pre-Anesthesia Assessment:                       - Prior to the procedure, a History and Physical was                        performed, and patient medications and allergies were                        reviewed. The patient's tolerance of previous                        anesthesia was also reviewed. The risks and benefits of                        the procedure and the sedation options and risks were                        discussed with the patient. All questions were                        answered, and informed consent was obtained. Prior                        Anticoagulants: The patient has taken no previous                        anticoagulant or antiplatelet agents. ASA Grade                        Assessment: III - A patient with severe systemic                        disease. After reviewing the risks and benefits, the                        patient was deemed in satisfactory condition to undergo                        the procedure.                       After  obtaining informed consent, the scope was passed                        under direct vision. Throughout the procedure, the                        patient's blood pressure, pulse, and oxygen saturations                        were monitored continuously. The Duodenoscope was                    introduced through the mouth, The procedure was                        aborted. The scope was not inserted. bile ducts                        Medications were stomach. The ERCP was technically                        difficult and complex due to abnormal anatomy. The                        patient tolerated the procedure well. Findings:      The upper GI tract was traversed under direct vision without detailed       examination. A large hiatal hernia was present. The patient had most of       the stomach in the chest with a J-shaped stomach. The pylorus could not       be reached. Impression:           - Large hiatal hernia.                       - Inability to transferse the pylorus due to anatomy. Recommendation:       - KUB stat to r/o perforation.                       Consider transfer to tertiary care center. Procedure Code(s):    --- Professional ---                       [phone], 52, Endoscopic retrograde                        cholangiopancreatography (ERCP); diagnostic, including                        collection of specimen(s) by brushing or washing, when                        performed (separate procedure) Diagnosis Code(s):    --- Professional ---                       K80.50, Calculus of bile duct without cholangitis or                        cholecystitis without obstruction                       K44.9, Diaphragmatic hernia without obstruction  or                        gangrene CPT copyright 2019 American Medical Association. All rights reserved. The codes documented in this report are preliminary and upon coder review may  be revised to meet current compliance requirements. Lucilla Lame MD, MD 02/02/2019 2:43:10 PM This report has been signed electronically. Number of Addenda: 0 Note Initiated On: 02/02/2019 1:57 PM      [facility]

## 2019-02-02 NOTE — Transfer of Care (Signed)
Immediate Anesthesia Transfer of Care Note  Patient: Lori Harrell  Procedure(s) Performed: ENDOSCOPIC RETROGRADE CHOLANGIOPANCREATOGRAPHY (ERCP) WITH PROPOFOL (N/A )  Patient Location: PACU  Anesthesia Type:General  Level of Consciousness: sedated  Airway & Oxygen Therapy: Patient Spontanous Breathing and Patient connected to nasal cannula oxygen  Post-op Assessment: Report given to RN and Post -op Vital signs reviewed and stable  Post vital signs: Reviewed and stable  Last Vitals:  Vitals Value Taken Time  BP 118/67 02/02/2019  2:37 PM  Temp 36 C 02/02/2019  2:37 PM  Pulse 90 02/02/2019  2:37 PM  Resp 16 02/02/2019  2:37 PM  SpO2 100 % 02/02/2019  2:37 PM  Vitals shown include unvalidated device data.  Last Pain:  Vitals:   02/02/19 1437  TempSrc: Tympanic  PainSc: 0-No pain      Patients Stated Pain Goal: 0 (70/26/37 8588)  Complications: No apparent anesthesia complications

## 2019-02-02 NOTE — Consult Note (Signed)
SURGICAL CONSULTATION NOTE   HISTORY OF PRESENT ILLNESS (HPI):  78 y.o. female presented to [facility] ED for evaluation of abdominal pain. Patient reports having abdominal pain on the upper abdomen mostly on the left upper quadrant.  This pain has been there for at least 10 days.  Complaints pain radiates to the right side of the abdomen.  This morning patient reports significant improvement compared to yesterday came to the ED.  She endorses nausea.  Denies vomiting.  There is no alleviating or aggravating factor.  Patient denies fever chills. ED labs were done showing leukocytosis and elevated liver enzyme including hyperbilirubinemia.  CT scan was done of the abdomen and shows a distended gallbladder with stones and a dilated common bile duct with a stone in the distal common bile duct.  There is a large hiatal hernia with most of the stomach in the chest.  I personally reviewed the images.  I also evaluated the labs results.  Surgery is consulted by Dr. Stark Jock in this context for evaluation and management of cholecystitis.  PAST MEDICAL HISTORY (PMH):  Past Medical History:  Diagnosis Date  . Acid reflux   . 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 Diabetes mellitus without complication ([place])   . Hypertension      PAST SURGICAL HISTORY ([place]):  Past Surgical History:  Procedure Laterality Date  . CATARACT EXTRACTION    . COLONOSCOPY  2007   sigmoid diverticulosis  . TONSILLECTOMY    . UPPER GASTROINTESTINAL ENDOSCOPY  02/2013   [facility] - done at [place]:  Prior to Admission medications   Medication Sig Start Date End Date Taking? Authorizing Provider  aspirin 81 MG tablet Take 81 mg by mouth daily.    Yes [provider]  atorvastatin (LIPITOR) 20 MG tablet TAKE 1 TABLET DAILY Patient taking differently: Take 20 mg by mouth daily at 6 PM.  07/16/18  Yes Glean Hess, MD  Calcium Carbonate-Vit D-Min (CALCIUM 1200 PO)  Take by mouth. 4 times weekly   Yes [provider]  dorzolamide-timolol (COSOPT) 22.3-6.8 MG/ML ophthalmic solution Apply to eye. 11/11/18 11/11/19 Yes [provider]  enalapril (VASOTEC) 20 MG tablet TAKE 1 TABLET DAILY Patient taking differently: Take 20 mg by mouth daily.  01/20/19  Yes Glean Hess, MD  ferrous sulfate (IRON SUPPLEMENT) 325 (65 FE) MG tablet Take 1 tablet by mouth 3 (three) times a week.    Yes [provider]  Inulin (FIBER CHOICE FRUITY BITES) 1.5 g CHEW Chew by mouth.   Yes [provider]  latanoprost (XALATAN) 0.005 % ophthalmic solution Place 1 drop into both eyes at bedtime. 10/09/15  Yes [provider]  metFORMIN (GLUCOPHAGE) 500 MG tablet TAKE 1 TABLET DAILY 07/16/18  Yes Glean Hess, MD  MULTIPLE VITAMINS-MINERALS PO Take by mouth.   Yes [provider]  pantoprazole (PROTONIX) 40 MG tablet TAKE 1 TABLET DAILY 01/08/19  Yes Glean Hess, MD  spironolactone (ALDACTONE) 25 MG tablet TAKE 1 TABLET DAILY 07/16/18  Yes Glean Hess, MD  Acetaminophen-Caffeine [facility] TENSION HEADACHE) 500-65 MG TABS Take by mouth 3 (three) times daily.     [provider]  FREESTYLE LITE test strip USE TO TEST BLOOD SUGAR DAILY 03/05/18   Glean Hess, MD  Lancets (FREESTYLE) lancets USE 1 LANCET DAILY 08/15/18   Glean Hess, MD  mupirocin cream (BACTROBAN) 2 % Apply 1  application topically daily. 01/07/19   Glean Hess, MD  timolol (TIMOPTIC) 0.5 % ophthalmic solution  04/10/15   [provider]     ALLERGIES:  No Known Allergies   SOCIAL HISTORY:  Social History   Socioeconomic History  . Marital status: Widowed    Spouse name: Not on file  . Number of children: 2  . Years of education: Not on file  . Highest education level: 12th grade  Occupational History  . Occupation: Retired  Scientific laboratory technician  . Financial resource strain: Not hard at all  . Food insecurity:    Worry:  Never true    Inability: Never true  . Transportation needs:    Medical: No    Non-medical: No  Tobacco Use  . Smoking status: Never Smoker  . Smokeless tobacco: Never Used  . Tobacco comment: smoking cessation materials not required  Substance and Sexual Activity  . Alcohol use: No    Alcohol/week: 0.0 standard drinks  . Drug use: No  . Sexual activity: Not Currently  Lifestyle  . Physical activity:    Days per week: 3 days    Minutes per session: 40 min  . Stress: Not at all  Relationships  . Social connections:    Talks on phone: Patient refused    Gets together: Patient refused    Attends religious service: Patient refused    Active member of club or organization: Patient refused    Attends meetings of clubs or organizations: Patient refused    Relationship status: Widowed  . Intimate partner violence:    Fear of current or ex partner: No    Emotionally abused: No    Physically abused: No    Forced sexual activity: No  Other Topics Concern  . Not on file  Social History Narrative  . Not on file    The patient currently resides (home / rehab facility / nursing home): Home The patient normally is (ambulatory / bedbound): Ambulatory   FAMILY HISTORY:  Family History  Problem Relation Age of Onset  . Diabetes Father   . CAD Father   . Breast cancer Neg Hx      REVIEW OF SYSTEMS:  Constitutional: denies weight loss, fever, chills, or sweats  Eyes: denies any other vision changes, history of eye injury  ENT: denies sore throat, hearing problems  Respiratory: denies shortness of breath, wheezing  Cardiovascular: denies chest pain, palpitations  Gastrointestinal: Positive abdominal pain, N/V,  Genitourinary: denies burning with urination or urinary frequency Musculoskeletal: denies any other joint pains or cramps  Skin: denies any other rashes or skin discolorations  Neurological: denies any other headache, dizziness, weakness  Psychiatric: denies any other  depression, anxiety   All other review of systems were negative   VITAL SIGNS:  Temp:  [96.8 F (36 C)-98.2 F (36.8 C)] 96.8 F (36 C) (05/05 1437) Pulse Rate:  [81-111] 81 (05/05 1450) Resp:  [18-25] 25 (05/05 1450) BP: (109-145)/(57-92) 142/75 (05/05 1506) SpO2:  [96 %-100 %] 100 % (05/05 1450)     Height: 5\' 1"  (154.9 cm) Weight: 55.3 kg BMI (Calculated): 23.05   PHYSICAL EXAM:  Constitutional:  -- Normal body habitus  -- Awake, alert, and oriented x3  Eyes:  -- Pupils equally round and reactive to light  -- No scleral icterus  Ear, nose, and throat:  -- No jugular venous distension  Pulmonary:  -- No crackles  -- Equal breath sounds bilaterally -- Breathing non-labored at rest Cardiovascular:  --  S1, S2 present  -- No pericardial rubs Gastrointestinal:  -- Abdomen soft, mild tender in upper quadrants, non-distended, no guarding or rebound tenderness -- No abdominal masses appreciated, pulsatile or otherwise  Musculoskeletal and Integumentary:  -- Wounds or skin discoloration: None appreciated -- Extremities: B/L UE and LE FROM, hands and feet warm, no edema  Neurologic:  -- Motor function: intact and symmetric -- Sensation: intact and symmetric   Labs:  CBC Latest Ref Rng & Units 02/02/2019 02/01/2019 01/07/2019  WBC 4.0 - 10.5 K/uL 11.4(H) 15.4(H) 12.0(H)  Hemoglobin 12.0 - 15.0 g/dL 10.3(L) 12.0 12.6  Hematocrit 36.0 - 46.0 % 30.5(L) 35.1(L) 38.2  Platelets 150 - 400 K/uL 244 294 376   CMP Latest Ref Rng & Units 02/02/2019 02/01/2019 01/07/2019  Glucose 70 - 99 mg/dL 108(H) 195(H) 126(H)  BUN 8 - 23 mg/dL 14 24(H) 11  Creatinine 0.44 - 1.00 mg/dL 0.45 0.80 0.82  Sodium 135 - 145 mmol/L 136 128(L) 140  Potassium 3.5 - 5.1 mmol/L 3.1(L) 3.4(L) 4.2  Chloride 98 - 111 mmol/L 106 96(L) 100  CO2 22 - 32 mmol/L 20(L) 22 22  Calcium 8.9 - 10.3 mg/dL 8.4(L) 9.7 10.3  Total Protein 6.5 - 8.1 g/dL 5.5(L) 7.0 -  Total Bilirubin 0.3 - 1.2 mg/dL 4.1(H) 4.3(H) -  Alkaline Phos  38 - 126 U/L 312(H) 339(H) -  AST 15 - 41 U/L 150(H) 163(H) -  ALT 0 - 44 U/L 201(H) 232(H) -     Imaging studies:  EXAM: CT ABDOMEN AND PELVIS WITHOUT CONTRAST  TECHNIQUE: Multidetector CT imaging of the abdomen and pelvis was performed following the standard protocol without IV contrast.  COMPARISON:  None.  FINDINGS: Lower chest: The patient has a large hiatal hernia containing most of the stomach as well as portions of the transverse colon. Heart size is normal. Coronary artery calcifications. Pericardial effusion. Lung bases are clear.  Hepatobiliary: Gallbladder is markedly distended thickening of the gallbladder wall. There are multiple stones in the gallbladder. There is a 15 mm stone in the distal common bile duct. There is intra and extrahepatic biliary dilatation.  Pancreas: Pancreas appears normal except for the stone in the distal common bile duct adjacent to the head of the pancreas.  Spleen: Normal in size without focal abnormality.  Adrenals/Urinary Tract: Adrenal glands are unremarkable. Kidneys are normal, without renal calculi, focal lesion, or hydronephrosis. Bladder is unremarkable.  Stomach/Bowel: There is a huge hiatal hernia containing most of the stomach as well as portions of the transverse colon. There multiple diverticula in the distal colon without diverticulitis. Small bowel and appendix appear normal.  Vascular/Lymphatic: Aortic atherosclerosis. No enlarged abdominal or pelvic lymph nodes.  Reproductive: Status post hysterectomy. No adnexal masses.  Other: No abdominal wall hernia or abnormality. No abdominopelvic ascites.  Musculoskeletal: No acute abnormality. Lumbar scoliosis with multilevel degenerative disc disease.  IMPRESSION: 1. 15 mm stone obstructing the distal common bile duct with dilated intra and extrahepatic bile ducts. The gallbladder is markedly distended 15 cm in length. Multiple stones in the  gallbladder. Gallbladder wall is thickened. 2. Huge hiatal hernia containing most of the stomach and portions of the transverse colon. 3. Sigmoid diverticulosis.  Critical Value/emergent results were called by telephone at the time of interpretation on 02/01/2019 at 12:05 pm to Dr. Thersa Salt , who verbally acknowledged these results.   Electronically Signed   By: Lorriane Shire M.D.   On: 02/01/2019 12:07  Assessment/Plan:  78 y.o. female with cholecystitis with  choledocholithiasis and ascending cholangitis, complicated by pertinent comorbidities including hypertension, diabetes mellitus and hyperlipidemia. Patient with physical exam, labs and imaging consistent with acute cholecystitis and ascending cholangitis with obstruction of the common bile duct.  The cholecystitis has been progressing for at least 10 days at the day she got to the hospital.  I discussed with the patient that due to the timing of the onset of symptoms the risk of complication from cholecystectomy at this moment is really high including increased rates of open cholecystectomy, but will need to be considered if she does not progress or if cholecystostomy cannot be done.  Today she was planned to have ERCP.  ERCP was attempted by Dr. Allen Norris and due to the complex anatomy of the large hiatal hernia and stomach in the chest he was unable to pass the pelvis.  He recommended transfer to tertiary care center.  I agree with his recommendation.  I recommend that the tertiary center has the capacity of hepatobiliary surgeon because if ERCP cannot be performed patient will need a cholecystectomy with common bile duct evaluation.  If this cannot be done in a timely manner, I recommend percutaneous cholecystostomy if the patient does not respond to IV antibiotics and having cholecystectomy after 6 weeks from this acute episode.  This is the safest alternative to reduce the risk of complication for this patient.  I discussed all this  recommendation with the patient and she understand.  Arnold Long, MD

## 2019-02-02 NOTE — Progress Notes (Signed)
Per MD okay for RN to DC telemetry order. 

## 2019-02-02 NOTE — Discharge Summary (Signed)
[facility] [place] NAME: Lori Harrell    MR#:  [id number]  [place] OF BIRTH:  11-29-1940  DATE OF ADMISSION:  02/01/2019 ADMITTING PHYSICIAN: Otila Back, MD  DATE OF DISCHARGE: 02/02/2019  PRIMARY CARE PHYSICIAN: Glean Hess, MD    ADMISSION DIAGNOSIS:  Choledocholithiasis [K80.50] Hyponatremia [E87.1]  DISCHARGE DIAGNOSIS:  Active Problems:   Acute cholangitis   SECONDARY DIAGNOSIS:   Past Medical History:  Diagnosis Date  . Acid reflux   . AVN (avascular necrosis of bone) ([place]) 07/07/2017  . Diabetes mellitus without complication ([place])   . Hypertension      HOSPITAL COURSE:   78 year old female with past medical history of hypertension diabetes, GERD, history of avascular necrosis who presented to the hospital due to abdominal pain and noted to have choledocholithiasis with acute cholecystitis.  1.  Acute cholecystitis with choledocholithiasis-this was the cause of patient's abdominal pain nausea vomiting on admission. -Patient was admitted to the hospital and treated for choledocholithiasis and cholecystitis with IV Zosyn.  A gastroenterology consult was obtained.  In ERCP was attempted today but due to the patient's large hiatal hernia it was not successful. -Patient needs transfer to a tertiary care center for ERCP and possible cholecystectomy in the near future.  Due to the risk of developing cholangitis and further deterioration patient is being transferred to a tertiary care center.  Spoke to the transfer center and patient is being transferred to [facility].  The accepting physician is Dr. Kalman Shan.  2.  Abnormal LFTs-secondary to #1.  - improving and can be further followed after ERCP.  3. HTN - BP stable.  -Patient well cont. Vasotec, Aldactone.   4. DM type II without complication -patient has been on sliding scale insulin for now blood sugars are stable.  5. GERD -patient will cont. Protonix.    6. Glaucoma -patient will cont. Xalatan, Timolol eye drops.   Patient has been transferred to [facility] for further treatment and evaluation of her underlying choledocholithiasis and acute cholecystitis.   DISCHARGE CONDITIONS:   Stable  CONSULTS OBTAINED:  Treatment Team:  [name], MD Herbert Pun, MD  DRUG ALLERGIES:  No Known Allergies  DISCHARGE MEDICATIONS:   Allergies as of 02/02/2019   No Known Allergies     Medication List    STOP taking these medications   aspirin 81 MG tablet     TAKE these medications   atorvastatin 20 MG tablet Commonly known as:  LIPITOR TAKE 1 TABLET DAILY What changed:  when to take this   CALCIUM 1200 PO Take by mouth. 4 times weekly   dorzolamide-timolol 22.3-6.8 MG/ML ophthalmic solution Commonly known as:  COSOPT Apply to eye.   enalapril 20 MG tablet Commonly known as:  VASOTEC TAKE 1 TABLET DAILY   Excedrin Tension Headache 500-65 MG Tabs Generic drug:  Acetaminophen-Caffeine Take by mouth 3 (three) times daily.   Fiber Choice Fruity Bites 1.5 g Chew Generic drug:  Inulin Chew by mouth.   freestyle lancets USE 1 LANCET DAILY   FREESTYLE LITE test strip Generic drug:  glucose blood USE TO TEST BLOOD SUGAR DAILY   Iron Supplement 325 (65 FE) MG tablet Generic drug:  ferrous sulfate Take 1 tablet by mouth 3 (three) times a week.   latanoprost 0.005 % ophthalmic solution Commonly known as:  XALATAN Place 1 drop into both eyes at bedtime.   metFORMIN 500 MG tablet Commonly known as:  GLUCOPHAGE TAKE  1 TABLET DAILY   MULTIPLE VITAMINS-MINERALS PO Take by mouth.   mupirocin cream 2 % Commonly known as:  BACTROBAN Apply 1 application topically daily.   pantoprazole 40 MG tablet Commonly known as:  PROTONIX TAKE 1 TABLET DAILY   piperacillin-tazobactam 3.375 GM/50ML IVPB Commonly known as:  ZOSYN Inject 50 mLs (3.375 g total) into the vein every 8 (eight) hours.    spironolactone 25 MG tablet Commonly known as:  ALDACTONE TAKE 1 TABLET DAILY   timolol 0.5 % ophthalmic solution Commonly known as:  TIMOPTIC         DISCHARGE INSTRUCTIONS:   DIET:   NPO  DISCHARGE CONDITION:   Stable  ACTIVITY:  AS tolerated  OXYGEN:  Home Oxygen: No   Oxygen Delivery: No  DISCHARGE LOCATION:  [facility]  If you experience worsening of your admission symptoms, develop shortness of breath, life threatening emergency, suicidal or homicidal thoughts you must seek medical attention immediately by calling 911 or calling your MD immediately  if symptoms less severe.  You Must read complete instructions/literature along with all the possible adverse reactions/side effects for all the Medicines you take and that have been prescribed to you. Take any new Medicines after you have completely understood and accpet all the possible adverse reactions/side effects.   Please note  You were cared for by a hospitalist during your hospital stay. If you have any questions about your discharge medications or the care you received while you were in the hospital after you are discharged, you can call the unit and asked to speak with the hospitalist on call if the hospitalist that took care of you is not available. Once you are discharged, your primary care physician will handle any further medical issues. Please note that NO REFILLS for any discharge medications will be authorized once you are discharged, as it is imperative that you return to your primary care physician (or establish a relationship with a primary care physician if you do not have one) for your aftercare needs so that they can reassess your need for medications and monitor your lab values.   DATA REVIEW:   CBC Recent Labs  Lab 02/02/19 0426  WBC 11.4*  HGB 10.3*  HCT 30.5*  PLT 244    Chemistries  Recent Labs  Lab 02/02/19 0426  NA 136  K 3.1*  CL 106  CO2 20*  GLUCOSE 108*   BUN 14  CREATININE 0.45  CALCIUM 8.4*  MG 1.4*  AST 150*  ALT 201*  ALKPHOS 312*  BILITOT 4.1*    Cardiac Enzymes No results for input(s): TROPONINI in the last 168 hours.  Microbiology Results  Results for orders placed or performed during the hospital encounter of 02/01/19  SARS Coronavirus 2 (CEPHEID - Performed in [facility] hospital lab), Hosp Order     Status: None   Collection Time: 02/01/19  3:10 PM  Result Value Ref Range Status   SARS Coronavirus 2 NEGATIVE NEGATIVE Final    Comment: (NOTE) If result is NEGATIVE SARS-CoV-2 target nucleic acids are NOT DETECTED. The SARS-CoV-2 RNA is generally detectable in upper and lower  respiratory specimens during the acute phase of infection. The lowest  concentration of SARS-CoV-2 viral copies this assay can detect is 250  copies / mL. A negative result does not preclude SARS-CoV-2 infection  and should not be used as the sole basis for treatment or other  patient management decisions.  A negative result may occur with  improper specimen  collection / handling, submission of specimen other  than nasopharyngeal swab, presence of viral mutation(s) within the  areas targeted by this assay, and inadequate number of viral copies  (<250 copies / mL). A negative result must be combined with clinical  observations, patient history, and epidemiological information. If result is POSITIVE SARS-CoV-2 target nucleic acids are DETECTED. The SARS-CoV-2 RNA is generally detectable in upper and lower  respiratory specimens dur ing the acute phase of infection.  Positive  results are indicative of active infection with SARS-CoV-2.  Clinical  correlation with patient history and other diagnostic information is  necessary to determine patient infection status.  Positive results do  not rule out bacterial infection or co-infection with other viruses. If result is PRESUMPTIVE POSTIVE SARS-CoV-2 nucleic acids MAY BE PRESENT.   A presumptive  positive result was obtained on the submitted specimen  and confirmed on repeat testing.  While 2019 novel coronavirus  (SARS-CoV-2) nucleic acids may be present in the submitted sample  additional confirmatory testing may be necessary for epidemiological  and / or clinical management purposes  to differentiate between  SARS-CoV-2 and other Sarbecovirus currently known to infect humans.  If clinically indicated additional testing with an alternate test  methodology [phone]) is advised. The SARS-CoV-2 RNA is generally  detectable in upper and lower respiratory sp ecimens during the acute  phase of infection. The expected result is Negative. Fact Sheet for Patients:  StrictlyIdeas.no Fact Sheet for Healthcare Providers: BankingDealers.co.za This test is not yet approved or cleared by the [place] FDA and has been authorized for detection and/or diagnosis of SARS-CoV-2 by FDA under an Emergency Use Authorization (EUA).  This EUA will remain in effect (meaning this test can be used) for the duration of the COVID-19 declaration under Section 564(b)(1) of the Act, 21 U.S.C. section 360bbb-3(b)(1), unless the authorization is terminated or revoked sooner. Performed at [facility], [address] [place], [place] [address]     RADIOLOGY:  Ct Abdomen Pelvis Wo Contrast  Result Date: 02/01/2019 CLINICAL DATA:  Epigastric and right upper quadrant abdominal pain. EXAM: CT ABDOMEN AND PELVIS WITHOUT CONTRAST TECHNIQUE: Multidetector CT imaging of the abdomen and pelvis was performed following the standard protocol without IV contrast. COMPARISON:  None. FINDINGS: Lower chest: The patient has a large hiatal hernia containing most of the stomach as well as portions of the transverse colon. Heart size is normal. Coronary artery calcifications. Pericardial effusion. Lung bases are clear. Hepatobiliary: Gallbladder is markedly distended  thickening of the gallbladder wall. There are multiple stones in the gallbladder. There is a 15 mm stone in the distal common bile duct. There is intra and extrahepatic biliary dilatation. Pancreas: Pancreas appears normal except for the stone in the distal common bile duct adjacent to the head of the pancreas. Spleen: Normal in size without focal abnormality. Adrenals/Urinary Tract: Adrenal glands are unremarkable. Kidneys are normal, without renal calculi, focal lesion, or hydronephrosis. Bladder is unremarkable. Stomach/Bowel: There is a huge hiatal hernia containing most of the stomach as well as portions of the transverse colon. There multiple diverticula in the distal colon without diverticulitis. Small bowel and appendix appear normal. Vascular/Lymphatic: Aortic atherosclerosis. No enlarged abdominal or pelvic lymph nodes. Reproductive: Status post hysterectomy. No adnexal masses. Other: No abdominal wall hernia or abnormality. No abdominopelvic ascites. Musculoskeletal: No acute abnormality. Lumbar scoliosis with multilevel degenerative disc disease. IMPRESSION: 1. 15 mm stone obstructing the distal common bile duct with dilated intra and extrahepatic bile ducts. The gallbladder  is markedly distended 15 cm in length. Multiple stones in the gallbladder. Gallbladder wall is thickened. 2. Huge hiatal hernia containing most of the stomach and portions of the transverse colon. 3. Sigmoid diverticulosis. Critical Value/emergent results were called by telephone at the time of interpretation on 02/01/2019 at 12:05 pm to Dr. Thersa Salt , who verbally acknowledged these results. Electronically Signed   By: Lorriane Shire M.D.   On: 02/01/2019 12:07   Dg Abd Portable 2v  Result Date: 02/02/2019 CLINICAL DATA:  Abdominal pain.  Incomplete ERCP. EXAM: PORTABLE ABDOMEN - 2 VIEW COMPARISON:  CT dated 02/01/2019 FINDINGS: Again noted is a large hiatal hernia. The bowel gas pattern is nonobstructive and nonspecific. There  is some gaseous distention of loops of colon. Degenerative changes are noted throughout the lumbar spine. There is no radiographic evidence of pneumatosis or free air. IMPRESSION: Nonobstructive bowel gas pattern. Mild gaseous distention is noted of the colon. Electronically Signed   By: Constance Holster M.D.   On: 02/02/2019 15:05      Management plans discussed with the patient, family and they are in agreement.  CODE STATUS:     Code Status Orders  (From admission, onward)         Start     Ordered   02/01/19 1919  Full code  Continuous     02/01/19 1919        Code Status History    This patient has a current code status but no historical code status.    Advance Directive Documentation     Most Recent Value  Type of Advance Directive  Healthcare Power of Attorney, Living will  Pre-existing out of facility DNR order (yellow form or pink MOST form)  -  "MOST" Form in Place?  -      TOTAL TIME TAKING CARE OF THIS PATIENT: 45 minutes.    Henreitta Leber M.D on 02/02/2019 at 5:16 PM  Between 7am to 6pm - Pager - [phone]  After 6pm go to [email or web address] - Proofreader  [facility]  Office  [phone]  CC: Primary care physician; Glean Hess, MD

## 2019-02-03 DIAGNOSIS — R768 Other specified abnormal immunological findings in serum: Secondary | ICD-10-CM | POA: Insufficient documentation

## 2019-02-03 DIAGNOSIS — K8050 Calculus of bile duct without cholangitis or cholecystitis without obstruction: Secondary | ICD-10-CM

## 2019-02-03 MED ORDER — TUSSI PRES-B 2-15-200 MG/5ML PO LIQD
0.50 | ORAL | Status: DC
Start: ? — End: 2019-02-03

## 2019-02-03 MED ORDER — Medication
1.00 | Status: DC
Start: 2019-02-03 — End: 2019-02-03

## 2019-02-03 MED ORDER — APAP PO
20.00 | ORAL | Status: DC
Start: 2019-02-03 — End: 2019-02-03

## 2019-02-03 MED ORDER — PANTOPRAZOLE SODIUM 40 MG PO TBEC
40.00 | DELAYED_RELEASE_TABLET | ORAL | Status: DC
Start: 2019-02-03 — End: 2019-02-03

## 2019-02-03 MED ORDER — CHEWABLE CALCIUM 500 MG PO WAFR
1.00 | WAFER | ORAL | Status: DC
Start: 2019-02-03 — End: 2019-02-03

## 2019-02-03 MED ORDER — ACETAMINOPHEN 325 MG PO TABS
500.00 | ORAL_TABLET | ORAL | Status: DC
Start: ? — End: 2019-02-03

## 2019-02-03 MED ORDER — Medication
3.38 | Status: DC
Start: 2019-02-03 — End: 2019-02-03

## 2019-02-03 MED ORDER — LACTATED RINGERS IV SOLN
INTRAVENOUS | Status: DC
Start: 2019-02-03 — End: 2019-02-03

## 2019-02-03 MED ORDER — GUAIFENESIN-POT GUAIACOL-DM 400-200-50 MG PO CP12
1.00 | ORAL_CAPSULE | ORAL | Status: DC
Start: 2019-02-03 — End: 2019-02-03

## 2019-02-03 NOTE — Anesthesia Postprocedure Evaluation (Signed)
Anesthesia Post Note  Patient: Lori Harrell  Procedure(s) Performed: ENDOSCOPIC RETROGRADE CHOLANGIOPANCREATOGRAPHY (ERCP) WITH PROPOFOL (N/A )  Patient location during evaluation: Endoscopy Anesthesia Type: General Level of consciousness: awake and alert Pain management: pain level controlled Vital Signs Assessment: post-procedure vital signs reviewed and stable Respiratory status: spontaneous breathing, nonlabored ventilation, respiratory function stable and patient connected to nasal cannula oxygen Cardiovascular status: blood pressure returned to baseline and stable Postop Assessment: no apparent nausea or vomiting Anesthetic complications: no     Last Vitals:  Vitals:   02/02/19 1921 02/02/19 1952  BP: 133/73 (!) 143/75  Pulse: 85 87  Resp: 20 18  Temp: 36.7 C 36.8 C  SpO2: 99% 99%    Last Pain:  Vitals:   02/02/19 1921  TempSrc: Oral  PainSc:                  [name]

## 2019-02-05 HISTORY — PX: CHOLECYSTECTOMY, LAPAROSCOPIC: SHX56

## 2019-02-13 ENCOUNTER — Encounter: Payer: Self-pay | Admitting: Internal Medicine

## 2019-02-15 ENCOUNTER — Ambulatory Visit (INDEPENDENT_AMBULATORY_CARE_PROVIDER_SITE_OTHER): Payer: Medicare Other | Admitting: Internal Medicine

## 2019-02-15 ENCOUNTER — Other Ambulatory Visit: Payer: Self-pay

## 2019-02-15 ENCOUNTER — Encounter: Payer: Self-pay | Admitting: Internal Medicine

## 2019-02-15 VITALS — BP 128/80 | HR 84 | Ht 64.0 in | Wt 118.0 lb

## 2019-02-15 DIAGNOSIS — M5136 Other intervertebral disc degeneration, lumbar region: Secondary | ICD-10-CM | POA: Diagnosis not present

## 2019-02-15 DIAGNOSIS — K8309 Other cholangitis: Secondary | ICD-10-CM | POA: Diagnosis not present

## 2019-02-15 DIAGNOSIS — D649 Anemia, unspecified: Secondary | ICD-10-CM

## 2019-02-15 DIAGNOSIS — I6523 Occlusion and stenosis of bilateral carotid arteries: Secondary | ICD-10-CM | POA: Diagnosis not present

## 2019-02-15 NOTE — Progress Notes (Signed)
Date:  02/15/2019   Name:  Lori Harrell   DOB:  1941/07/16   MRN:  [id number]   Chief Complaint: Hospitalization Follow-up (Pt states she is feeling better every day. ) Hospital follow up.  Patient initially admitted to [facility] on 02/01/19  with acute cholangitis due to a retained stone.  Attempted ERCP to remove the stone but was not able to be done due to complex anatomy.  Due to complexity of her case, she was transferred to [facility] on 02/02/1009 where she had successful ERCP with stone removal/sphinterotomy.  Also showed large HH.  She underwent cholecystectomy on 06/08/82 without complications was discharged home the following day on 02/05/19. She reports doing well since being home.  Her appetite is improving.  She has mild constipation for which she takes Miralax as needed.  No abdominal pain.  Laparoscopy sites are healing without pain or drainage. She is wondering if she can start PT for her back next week.  Also needs to get her back injection and wants to be sure she can lay prone for that.  Summary of Hospital course: -CHOLANGITIS -acute cholecystitis -CHOLELITHIASIS S/P CHOLECYSTECTOMY Got pip tazo in hospital and discharged to finish course of Augmentin  Had ERCP done with CBD stone removal and sphincterotomy done. ERCP showed large hiatal hernia and duodenal diverticulum  Cholecystectomy done the day before discharge Did well after both procedures No fever in hospital and blood cx neg LFT improved during her stay Repeat LFT on follow up  -large hiatal hernia/hx of Barrett esophagus On PPI CT done at OSH showed large hiatal hernia containing stomach,fat and loops of bowel without evidence of obstruction  Defer further management to outpatient Denied aspiration,dysphagia  -ANEMIA No bleeding Repeat CBC on follow up with PCP,FERRITIN 133 and iron sat 24% Defer further work up to outpatient   -DM On metformin at home; no current issues A1C 6.2  -HTN bp well controlled  during her stay Controlled at home  -Non gap acidosis on admission Repeat BMP on follow up with PCP  -Mg 1.7 Consider repeat level on follow up with PCP  -BACK PAIN RADIATING to RLE Followed as outpatient with ortho and getting injections and outpatient PT  All other medical problems stable,inclydunb  Surgeries and Procedures Performed:  ERCP LAPAROSCOPY, SURGICAL; CHOLECYSTECTOMY  HPI  Review of Systems  Constitutional: Negative for chills, diaphoresis, fatigue, fever and unexpected weight change.  HENT: Negative for trouble swallowing.   Respiratory: Negative for chest tightness, shortness of breath and wheezing.   Cardiovascular: Negative for chest pain and palpitations.  Gastrointestinal: Positive for constipation. Negative for abdominal pain, blood in stool and diarrhea.  Neurological: Negative for dizziness, light-headedness and headaches.  Hematological: Negative for adenopathy.  Psychiatric/Behavioral: Positive for sleep disturbance. Negative for dysphoric mood. The patient is not nervous/anxious.     Patient Active Problem List   Diagnosis Date Noted  . 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 Unstageable pressure ulcer of back ([place]) 01/07/2019  . DDD (degenerative disc disease), lumbar 01/07/2019  . Carotid artery stenosis, asymptomatic, bilateral 07/07/2017  . Primary open angle glaucoma 05/08/2017  . 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 Large hiatal hernia 07/04/2016  . Sigmoid diverticulitis 07/04/2016  . Stress incontinence 07/04/2016  . Type II diabetes mellitus with manifestations ([place]) 10/31/2015  . Constipation 10/31/2015  . 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 Essential (primary) hypertension 06/16/2015  . Hyperlipidemia associated with type 2 diabetes mellitus ([place]) 06/16/2015  . OP (osteoporosis)  06/16/2015  . Hypercalcemia 04/22/2012    No Known Allergies  Past Surgical History:  Procedure Laterality Date  . CATARACT EXTRACTION     . CHOLECYSTECTOMY, LAPAROSCOPIC  02/05/2019   [facility]  . COLONOSCOPY  2007   sigmoid diverticulosis  . ENDOSCOPIC RETROGRADE CHOLANGIOPANCREATOGRAPHY (ERCP) WITH PROPOFOL N/A 02/02/2019   Procedure: ENDOSCOPIC RETROGRADE CHOLANGIOPANCREATOGRAPHY (ERCP) WITH PROPOFOL;  Surgeon: Lucilla Lame, MD;  Location: [facility] ENDOSCOPY;  Service: Endoscopy;  Laterality: N/A;  . TONSILLECTOMY    . UPPER GASTROINTESTINAL ENDOSCOPY  02/2013   Barrett's - done at [place] History   Tobacco Use  . Smoking status: Never Smoker  . Smokeless tobacco: Never Used  . Tobacco comment: smoking cessation materials not required  Substance Use Topics  . Alcohol use: No    Alcohol/week: 0.0 standard drinks  . Drug use: No     Medication list has been reviewed and updated.  Current Meds  Medication Sig  . Acetaminophen-Caffeine (EXCEDRIN TENSION HEADACHE) 500-65 MG TABS Take by mouth 3 (three) times daily.   Marland Kitchen atorvastatin (LIPITOR) 20 MG tablet TAKE 1 TABLET DAILY (Patient taking differently: Take 20 mg by mouth daily at 6 PM. )  . Calcium Carbonate-Vit D-Min (CALCIUM 1200 PO) Take by mouth. 4 times weekly  . dorzolamide-timolol (COSOPT) 22.3-6.8 MG/ML ophthalmic solution Apply to eye.  . enalapril (VASOTEC) 20 MG tablet TAKE 1 TABLET DAILY (Patient taking differently: Take 20 mg by mouth daily. )  . ferrous sulfate (IRON SUPPLEMENT) 325 (65 FE) MG tablet Take 1 tablet by mouth 3 (three) times a week.   Marland Kitchen FREESTYLE LITE test strip USE TO TEST BLOOD SUGAR DAILY  . Inulin (FIBER CHOICE FRUITY BITES) 1.5 g CHEW Chew by mouth.  . Lancets (FREESTYLE) lancets USE 1 LANCET DAILY  . latanoprost (XALATAN) 0.005 % ophthalmic solution Place 1 drop into both eyes at bedtime.  . metFORMIN (GLUCOPHAGE) 500 MG tablet TAKE 1 TABLET DAILY  . MULTIPLE VITAMINS-MINERALS PO Take by mouth.  . mupirocin cream (BACTROBAN) 2 % Apply 1 application topically daily.  . pantoprazole (PROTONIX) 40 MG  tablet TAKE 1 TABLET DAILY  . piperacillin-tazobactam (ZOSYN) 3.375 GM/50ML IVPB Inject 50 mLs (3.375 g total) into the vein every 8 (eight) hours.  Marland Kitchen spironolactone (ALDACTONE) 25 MG tablet TAKE 1 TABLET DAILY  . timolol (TIMOPTIC) 0.5 % ophthalmic solution     PHQ 2/9 Scores 02/15/2019 01/07/2019 10/28/2018 07/08/2018  PHQ - 2 Score 0 4 0 0  PHQ- 9 Score 0 18 - 0    BP Readings from Last 3 Encounters:  02/15/19 128/80  02/02/19 (!) 143/75  02/01/19 129/84    Physical Exam Constitutional:      Appearance: Normal appearance.  HENT:     Head: Normocephalic.  Eyes:     Pupils: Pupils are equal, round, and reactive to light.  Neck:     Musculoskeletal: Normal range of motion and neck supple.  Cardiovascular:     Rate and Rhythm: Normal rate and regular rhythm.     Pulses: Normal pulses.  Pulmonary:     Effort: Pulmonary effort is normal.     Breath sounds: Normal breath sounds. No wheezing or rhonchi.  Abdominal:     General: Bowel sounds are normal.     Palpations: Abdomen is soft.       Comments: 4 laparotomy sites healing well with skin adhesive still in place - no s/s infection  Musculoskeletal:  Right lower leg: No edema.     Left lower leg: No edema.  Lymphadenopathy:     Cervical: No cervical adenopathy.  Skin:    General: Skin is warm and dry.  Neurological:     Mental Status: She is alert.  Psychiatric:        Mood and Affect: Mood normal.        Behavior: Behavior normal.     Wt Readings from Last 3 Encounters:  02/15/19 118 lb (53.5 kg)  02/01/19 121 lb 14.6 oz (55.3 kg)  02/01/19 122 lb (55.3 kg)    BP 128/80   Pulse 84   Ht 5\' 4"  (1.626 m)   Wt 118 lb (53.5 kg)   SpO2 100%   BMI 20.25 kg/m   Assessment and Plan: 1. Acute cholangitis S/p cholecystectomy; doing well with normal healing Continue to advance diet - Comprehensive metabolic panel  2. Anemia, unspecified type Hold iron for now - check labs - CBC with Differential/Platelet   3. Hypomagnesemia Check labs and supplement if needed - Magnesium  4. DDD (degenerative disc disease), lumbar Followed by Neurosurgery and pain management   Partially dictated using Dragon software. Any errors are unintentional.  Halina Maidens, MD [facility]  02/15/2019

## 2019-02-16 LAB — CBC WITH DIFFERENTIAL/PLATELET
Basophils Absolute: 0.1 10*3/uL (ref 0.0–0.2)
Basos: 1 %
EOS (ABSOLUTE): 0.1 10*3/uL (ref 0.0–0.4)
Eos: 2 %
Hematocrit: 31.5 % — ABNORMAL LOW (ref 34.0–46.6)
Hemoglobin: 10.7 g/dL — ABNORMAL LOW (ref 11.1–15.9)
Immature Grans (Abs): 0 10*3/uL (ref 0.0–0.1)
Immature Granulocytes: 0 %
Lymphocytes Absolute: 1.6 10*3/uL (ref 0.7–3.1)
Lymphs: 20 %
MCH: 32.2 pg (ref 26.6–33.0)
MCHC: 34 g/dL (ref 31.5–35.7)
MCV: 95 fL (ref 79–97)
Monocytes Absolute: 0.6 10*3/uL (ref 0.1–0.9)
Monocytes: 7 %
Neutrophils Absolute: 5.4 10*3/uL (ref 1.4–7.0)
Neutrophils: 70 %
Platelets: 484 10*3/uL — ABNORMAL HIGH (ref 150–450)
RBC: 3.32 x10E6/uL — ABNORMAL LOW (ref 3.77–5.28)
RDW: 12.5 % (ref 11.7–15.4)
WBC: 7.8 10*3/uL (ref 3.4–10.8)

## 2019-02-16 LAB — COMPREHENSIVE METABOLIC PANEL
ALT: 24 IU/L (ref 0–32)
AST: 24 IU/L (ref 0–40)
Albumin/Globulin Ratio: 2.1 (ref 1.2–2.2)
Albumin: 4.1 g/dL (ref 3.7–4.7)
Alkaline Phosphatase: 162 IU/L — ABNORMAL HIGH (ref 39–117)
BUN/Creatinine Ratio: 15 (ref 12–28)
BUN: 10 mg/dL (ref 8–27)
Bilirubin Total: 0.7 mg/dL (ref 0.0–1.2)
CO2: 21 mmol/L (ref 20–29)
Calcium: 10.1 mg/dL (ref 8.7–10.3)
Chloride: 100 mmol/L (ref 96–106)
Creatinine, Ser: 0.68 mg/dL (ref 0.57–1.00)
GFR calc Af Amer: 97 mL/min/{1.73_m2} (ref >59)
GFR calc non Af Amer: 84 mL/min/{1.73_m2} (ref >59)
Globulin, Total: 2 g/dL (ref 1.5–4.5)
Glucose: 103 mg/dL — ABNORMAL HIGH (ref 65–99)
Potassium: 4.1 mmol/L (ref 3.5–5.2)
Sodium: 138 mmol/L (ref 134–144)
Total Protein: 6.1 g/dL (ref 6.0–8.5)

## 2019-02-16 LAB — MAGNESIUM: Magnesium: 1.5 mg/dL — ABNORMAL LOW (ref 1.6–2.3)

## 2019-02-18 DIAGNOSIS — M5441 Lumbago with sciatica, right side: Secondary | ICD-10-CM | POA: Diagnosis not present

## 2019-02-18 DIAGNOSIS — M2560 Stiffness of unspecified joint, not elsewhere classified: Secondary | ICD-10-CM | POA: Diagnosis not present

## 2019-02-18 DIAGNOSIS — M5416 Radiculopathy, lumbar region: Secondary | ICD-10-CM | POA: Diagnosis not present

## 2019-02-18 DIAGNOSIS — M6281 Muscle weakness (generalized): Secondary | ICD-10-CM | POA: Diagnosis not present

## 2019-02-18 DIAGNOSIS — G8929 Other chronic pain: Secondary | ICD-10-CM | POA: Diagnosis not present

## 2019-02-23 DIAGNOSIS — M5441 Lumbago with sciatica, right side: Secondary | ICD-10-CM | POA: Diagnosis not present

## 2019-02-23 DIAGNOSIS — G8929 Other chronic pain: Secondary | ICD-10-CM | POA: Diagnosis not present

## 2019-02-23 DIAGNOSIS — M5416 Radiculopathy, lumbar region: Secondary | ICD-10-CM | POA: Diagnosis not present

## 2019-02-23 DIAGNOSIS — M6281 Muscle weakness (generalized): Secondary | ICD-10-CM | POA: Diagnosis not present

## 2019-02-23 DIAGNOSIS — M2560 Stiffness of unspecified joint, not elsewhere classified: Secondary | ICD-10-CM | POA: Diagnosis not present

## 2019-02-24 DIAGNOSIS — M5136 Other intervertebral disc degeneration, lumbar region: Secondary | ICD-10-CM | POA: Diagnosis not present

## 2019-02-24 DIAGNOSIS — M5416 Radiculopathy, lumbar region: Secondary | ICD-10-CM | POA: Diagnosis not present

## 2019-03-02 ENCOUNTER — Ambulatory Visit
Admission: RE | Admit: 2019-03-02 | Discharge: 2019-03-02 | Disposition: A | Discharge status: Home / Self Care | Payer: Medicare Other | Source: Ambulatory Visit | Attending: Internal Medicine | Admitting: Internal Medicine

## 2019-03-02 ENCOUNTER — Encounter (INDEPENDENT_AMBULATORY_CARE_PROVIDER_SITE_OTHER): Payer: Self-pay

## 2019-03-02 ENCOUNTER — Other Ambulatory Visit: Payer: Self-pay

## 2019-03-02 DIAGNOSIS — M5416 Radiculopathy, lumbar region: Secondary | ICD-10-CM | POA: Diagnosis not present

## 2019-03-02 DIAGNOSIS — M2560 Stiffness of unspecified joint, not elsewhere classified: Secondary | ICD-10-CM | POA: Diagnosis not present

## 2019-03-02 DIAGNOSIS — M6281 Muscle weakness (generalized): Secondary | ICD-10-CM | POA: Diagnosis not present

## 2019-03-02 DIAGNOSIS — M5441 Lumbago with sciatica, right side: Secondary | ICD-10-CM | POA: Diagnosis not present

## 2019-03-02 DIAGNOSIS — G8929 Other chronic pain: Secondary | ICD-10-CM | POA: Diagnosis not present

## 2019-03-02 DIAGNOSIS — Z1231 Encounter for screening mammogram for malignant neoplasm of breast: Secondary | ICD-10-CM | POA: Diagnosis not present

## 2019-03-08 DIAGNOSIS — M5416 Radiculopathy, lumbar region: Secondary | ICD-10-CM | POA: Diagnosis not present

## 2019-03-08 DIAGNOSIS — G8929 Other chronic pain: Secondary | ICD-10-CM | POA: Diagnosis not present

## 2019-03-08 DIAGNOSIS — M6281 Muscle weakness (generalized): Secondary | ICD-10-CM | POA: Diagnosis not present

## 2019-03-08 DIAGNOSIS — M5441 Lumbago with sciatica, right side: Secondary | ICD-10-CM | POA: Diagnosis not present

## 2019-03-08 DIAGNOSIS — M2560 Stiffness of unspecified joint, not elsewhere classified: Secondary | ICD-10-CM | POA: Diagnosis not present

## 2019-03-11 DIAGNOSIS — M5416 Radiculopathy, lumbar region: Secondary | ICD-10-CM | POA: Diagnosis not present

## 2019-03-17 DIAGNOSIS — M5416 Radiculopathy, lumbar region: Secondary | ICD-10-CM | POA: Diagnosis not present

## 2019-03-17 DIAGNOSIS — M5126 Other intervertebral disc displacement, lumbar region: Secondary | ICD-10-CM | POA: Diagnosis not present

## 2019-03-22 DIAGNOSIS — M5441 Lumbago with sciatica, right side: Secondary | ICD-10-CM | POA: Diagnosis not present

## 2019-03-22 DIAGNOSIS — M5416 Radiculopathy, lumbar region: Secondary | ICD-10-CM | POA: Diagnosis not present

## 2019-03-22 DIAGNOSIS — M6281 Muscle weakness (generalized): Secondary | ICD-10-CM | POA: Diagnosis not present

## 2019-03-22 DIAGNOSIS — M2560 Stiffness of unspecified joint, not elsewhere classified: Secondary | ICD-10-CM | POA: Diagnosis not present

## 2019-03-22 DIAGNOSIS — G8929 Other chronic pain: Secondary | ICD-10-CM | POA: Diagnosis not present

## 2019-03-25 DIAGNOSIS — M2560 Stiffness of unspecified joint, not elsewhere classified: Secondary | ICD-10-CM | POA: Diagnosis not present

## 2019-03-25 DIAGNOSIS — M5441 Lumbago with sciatica, right side: Secondary | ICD-10-CM | POA: Diagnosis not present

## 2019-03-25 DIAGNOSIS — M5416 Radiculopathy, lumbar region: Secondary | ICD-10-CM | POA: Diagnosis not present

## 2019-03-25 DIAGNOSIS — G8929 Other chronic pain: Secondary | ICD-10-CM | POA: Diagnosis not present

## 2019-03-25 DIAGNOSIS — M6281 Muscle weakness (generalized): Secondary | ICD-10-CM | POA: Diagnosis not present

## 2019-03-29 DIAGNOSIS — M6281 Muscle weakness (generalized): Secondary | ICD-10-CM | POA: Diagnosis not present

## 2019-03-29 DIAGNOSIS — G8929 Other chronic pain: Secondary | ICD-10-CM | POA: Diagnosis not present

## 2019-03-29 DIAGNOSIS — M5441 Lumbago with sciatica, right side: Secondary | ICD-10-CM | POA: Diagnosis not present

## 2019-03-29 DIAGNOSIS — M2560 Stiffness of unspecified joint, not elsewhere classified: Secondary | ICD-10-CM | POA: Diagnosis not present

## 2019-03-29 DIAGNOSIS — M5416 Radiculopathy, lumbar region: Secondary | ICD-10-CM | POA: Diagnosis not present

## 2019-04-01 DIAGNOSIS — M5416 Radiculopathy, lumbar region: Secondary | ICD-10-CM | POA: Diagnosis not present

## 2019-04-01 DIAGNOSIS — G8929 Other chronic pain: Secondary | ICD-10-CM | POA: Diagnosis not present

## 2019-04-01 DIAGNOSIS — M5441 Lumbago with sciatica, right side: Secondary | ICD-10-CM | POA: Diagnosis not present

## 2019-04-01 DIAGNOSIS — M2560 Stiffness of unspecified joint, not elsewhere classified: Secondary | ICD-10-CM | POA: Diagnosis not present

## 2019-04-01 DIAGNOSIS — M6281 Muscle weakness (generalized): Secondary | ICD-10-CM | POA: Diagnosis not present

## 2019-04-05 DIAGNOSIS — K8044 Calculus of bile duct with chronic cholecystitis without obstruction: Secondary | ICD-10-CM | POA: Diagnosis not present

## 2019-04-07 ENCOUNTER — Other Ambulatory Visit: Payer: Self-pay

## 2019-04-07 ENCOUNTER — Encounter (INDEPENDENT_AMBULATORY_CARE_PROVIDER_SITE_OTHER): Payer: Self-pay

## 2019-04-07 ENCOUNTER — Ambulatory Visit
Admission: RE | Admit: 2019-04-07 | Discharge: 2019-04-07 | Disposition: A | Discharge status: Home / Self Care | Payer: Medicare Other | Source: Ambulatory Visit | Attending: Internal Medicine | Admitting: Internal Medicine

## 2019-04-07 DIAGNOSIS — I6523 Occlusion and stenosis of bilateral carotid arteries: Secondary | ICD-10-CM | POA: Diagnosis not present

## 2019-04-08 DIAGNOSIS — M5416 Radiculopathy, lumbar region: Secondary | ICD-10-CM | POA: Diagnosis not present

## 2019-04-08 DIAGNOSIS — M2560 Stiffness of unspecified joint, not elsewhere classified: Secondary | ICD-10-CM | POA: Diagnosis not present

## 2019-04-08 DIAGNOSIS — G8929 Other chronic pain: Secondary | ICD-10-CM | POA: Diagnosis not present

## 2019-04-08 DIAGNOSIS — M5441 Lumbago with sciatica, right side: Secondary | ICD-10-CM | POA: Diagnosis not present

## 2019-04-08 DIAGNOSIS — M6281 Muscle weakness (generalized): Secondary | ICD-10-CM | POA: Diagnosis not present

## 2019-04-13 DIAGNOSIS — M5416 Radiculopathy, lumbar region: Secondary | ICD-10-CM | POA: Diagnosis not present

## 2019-04-15 DIAGNOSIS — M5416 Radiculopathy, lumbar region: Secondary | ICD-10-CM | POA: Diagnosis not present

## 2019-04-19 DIAGNOSIS — M2560 Stiffness of unspecified joint, not elsewhere classified: Secondary | ICD-10-CM | POA: Diagnosis not present

## 2019-04-19 DIAGNOSIS — M5441 Lumbago with sciatica, right side: Secondary | ICD-10-CM | POA: Diagnosis not present

## 2019-04-19 DIAGNOSIS — G8929 Other chronic pain: Secondary | ICD-10-CM | POA: Diagnosis not present

## 2019-04-19 DIAGNOSIS — M5416 Radiculopathy, lumbar region: Secondary | ICD-10-CM | POA: Diagnosis not present

## 2019-04-19 DIAGNOSIS — M6281 Muscle weakness (generalized): Secondary | ICD-10-CM | POA: Diagnosis not present

## 2019-04-20 ENCOUNTER — Telehealth: Payer: Self-pay

## 2019-04-20 NOTE — Telephone Encounter (Signed)
Called and spoke with patient. She will ask her neurosurgeon for pain management until surgery.

## 2019-04-20 NOTE — Telephone Encounter (Signed)
I hate to be difficult, but if they are unable to help her with surgery until next month, then they need to be the ones giving her pain medication until then.  They will surely give her pain meds after surgery so I don't see what the issue is.

## 2019-04-20 NOTE — Telephone Encounter (Signed)
Patient called saying she has been seeing Dr. Jefm Bryant ( in care everywhere ) for hip/ knee/ back pain. They have decided that the only form of treatment for this will be surgery.  She said they cannot get her in for surgery until the middle of next month. Was told by her ortho doctor to call her pcp discuss pain relief until surgery.  Please advise for documentation. ( I know we do not typically do pain meds. )

## 2019-04-23 DIAGNOSIS — Z1159 Encounter for screening for other viral diseases: Secondary | ICD-10-CM | POA: Diagnosis not present

## 2019-04-23 DIAGNOSIS — I10 Essential (primary) hypertension: Secondary | ICD-10-CM | POA: Diagnosis not present

## 2019-04-26 DIAGNOSIS — K2270 Barrett's esophagus without dysplasia: Secondary | ICD-10-CM | POA: Diagnosis not present

## 2019-04-26 DIAGNOSIS — K449 Diaphragmatic hernia without obstruction or gangrene: Secondary | ICD-10-CM | POA: Diagnosis not present

## 2019-04-26 DIAGNOSIS — K210 Gastro-esophageal reflux disease with esophagitis: Secondary | ICD-10-CM | POA: Diagnosis not present

## 2019-04-26 DIAGNOSIS — Z79899 Other long term (current) drug therapy: Secondary | ICD-10-CM | POA: Diagnosis not present

## 2019-04-26 DIAGNOSIS — Z8719 Personal history of other diseases of the digestive system: Secondary | ICD-10-CM | POA: Diagnosis not present

## 2019-04-26 DIAGNOSIS — E119 Type 2 diabetes mellitus without complications: Secondary | ICD-10-CM | POA: Diagnosis not present

## 2019-04-26 DIAGNOSIS — K2210 Ulcer of esophagus without bleeding: Secondary | ICD-10-CM | POA: Diagnosis not present

## 2019-04-26 DIAGNOSIS — Z09 Encounter for follow-up examination after completed treatment for conditions other than malignant neoplasm: Secondary | ICD-10-CM | POA: Diagnosis not present

## 2019-04-26 HISTORY — PX: ESOPHAGOGASTRODUODENOSCOPY: SHX1529

## 2019-04-27 ENCOUNTER — Telehealth: Payer: Self-pay

## 2019-04-27 DIAGNOSIS — M2560 Stiffness of unspecified joint, not elsewhere classified: Secondary | ICD-10-CM | POA: Diagnosis not present

## 2019-04-27 DIAGNOSIS — M5441 Lumbago with sciatica, right side: Secondary | ICD-10-CM | POA: Diagnosis not present

## 2019-04-27 DIAGNOSIS — M6281 Muscle weakness (generalized): Secondary | ICD-10-CM | POA: Diagnosis not present

## 2019-04-27 DIAGNOSIS — M5416 Radiculopathy, lumbar region: Secondary | ICD-10-CM | POA: Diagnosis not present

## 2019-04-27 DIAGNOSIS — G8929 Other chronic pain: Secondary | ICD-10-CM | POA: Diagnosis not present

## 2019-04-27 NOTE — Telephone Encounter (Signed)
Patient called to inform us she is having back surgery August 19th.  FYI

## 2019-04-30 ENCOUNTER — Other Ambulatory Visit: Payer: Self-pay | Admitting: Neurosurgery

## 2019-05-10 ENCOUNTER — Other Ambulatory Visit: Payer: Self-pay

## 2019-05-10 ENCOUNTER — Encounter
Admission: RE | Admit: 2019-05-10 | Discharge: 2019-05-10 | Disposition: A | Discharge status: Home / Self Care | Payer: Medicare Other | Source: Ambulatory Visit | Attending: Neurosurgery | Admitting: Neurosurgery

## 2019-05-10 ENCOUNTER — Other Ambulatory Visit: Payer: Self-pay | Admitting: Internal Medicine

## 2019-05-10 DIAGNOSIS — E785 Hyperlipidemia, unspecified: Secondary | ICD-10-CM | POA: Diagnosis not present

## 2019-05-10 DIAGNOSIS — Z20828 Contact with and (suspected) exposure to other viral communicable diseases: Secondary | ICD-10-CM | POA: Diagnosis not present

## 2019-05-10 DIAGNOSIS — Z01818 Encounter for other preprocedural examination: Secondary | ICD-10-CM | POA: Insufficient documentation

## 2019-05-10 HISTORY — DX: Hyperlipidemia, unspecified: E78.5

## 2019-05-10 LAB — CBC
HCT: 39.8 % (ref 36.0–46.0)
Hemoglobin: 12.7 g/dL (ref 12.0–15.0)
MCH: 29.8 pg (ref 26.0–34.0)
MCHC: 31.9 g/dL (ref 30.0–36.0)
MCV: 93.4 fL (ref 80.0–100.0)
Platelets: 295 10*3/uL (ref 150–400)
RBC: 4.26 MIL/uL (ref 3.87–5.11)
RDW: 13.1 % (ref 11.5–15.5)
WBC: 7.5 10*3/uL (ref 4.0–10.5)
nRBC: 0 % (ref 0.0–0.2)

## 2019-05-10 LAB — PROTIME-INR
INR: 0.9 (ref 0.8–1.2)
Prothrombin Time: 12 seconds (ref 11.4–15.2)

## 2019-05-10 LAB — BASIC METABOLIC PANEL
Anion gap: 11 (ref 5–15)
BUN: 15 mg/dL (ref 8–23)
CO2: 25 mmol/L (ref 22–32)
Calcium: 10.2 mg/dL (ref 8.9–10.3)
Chloride: 103 mmol/L (ref 98–111)
Creatinine, Ser: 0.6 mg/dL (ref 0.44–1.00)
GFR calc Af Amer: >60 mL/min (ref >60)
GFR calc non Af Amer: >60 mL/min (ref >60)
Glucose, Bld: 98 mg/dL (ref 70–99)
Potassium: 3.8 mmol/L (ref 3.5–5.1)
Sodium: 139 mmol/L (ref 135–145)

## 2019-05-10 LAB — MRSA PCR SCREENING: MRSA by PCR: NEGATIVE

## 2019-05-10 LAB — APTT: aPTT: 26 seconds (ref 24–36)

## 2019-05-10 NOTE — Patient Instructions (Addendum)
Your procedure is scheduled on: 05/19/2019 Wed Report to Same Day Surgery 2nd floor medical mall [facility] Entrance-take elevator on left to 2nd floor.  Check in with surgery information desk.) To find out your arrival time please call [phone] between 1PM - 3PM on 05/18/2019 Tues  Remember: Instructions that are not followed completely may result in serious medical risk, up to and including death, or upon the discretion of your surgeon and anesthesiologist your surgery may need to be rescheduled.    _x___ 1. Do not eat food after midnight the night before your procedure. You may drink clear liquids up to 2 hours before you are scheduled to arrive at the hospital for your procedure.  Do not drink clear liquids within 2 hours of your scheduled arrival to the hospital.  Clear liquids include  --Water or Apple juice without pulp  --Clear carbohydrate beverage such as ClearFast or Gatorade  --Black Coffee or Clear Tea (No milk, no creamers, do not add anything to                  the coffee or Tea Type 1 and type 2 diabetics should only drink water.   ____Ensure clear carbohydrate drink on the way to the hospital for bariatric patients  ____Ensure clear carbohydrate drink 3 hours before surgery.   No gum chewing or hard candies.     __x__ 2. No Alcohol for 24 hours before or after surgery.   __x__3. No Smoking or e-cigarettes for 24 prior to surgery.  Do not use any chewable tobacco products for at least 6 hour prior to surgery   ____  4. Bring all medications with you on the day of surgery if instructed.    __x__ 5. Notify your doctor if there is any change in your medical condition     (cold, fever, infections).    x___6. On the morning of surgery brush your teeth with toothpaste and water.  You may rinse your mouth with mouth wash if you wish.  Do not swallow any toothpaste or mouthwash.   Do not wear jewelry, make-up, hairpins, clips or nail polish.  Do not wear lotions,  powders, or perfumes. You may wear deodorant.  Do not shave 48 hours prior to surgery. Men may shave face and neck.  Do not bring valuables to the hospital.    [facility] is not responsible for any belongings or valuables.               Contacts, dentures or bridgework may not be worn into surgery.  Leave your suitcase in the car. After surgery it may be brought to your room.  For patients admitted to the hospital, discharge time is determined by your                       treatment team.  _  Patients discharged the day of surgery will not be allowed to drive home.  You will need someone to drive you home and stay with you the night of your procedure.    Please read over the following fact sheets that you were given:   [facility] Preparing for Surgery and or MRSA Information   _x___ Take anti-hypertensive listed below, cardiac, seizure, asthma,     anti-reflux and psychiatric medicines. These include:  1. latanoprost (XALATAN) 0.005 % ophthalmic solution  2.methocarbamol (ROBAXIN) 500 MG tablet  3.pantoprazole (PROTONIX) 40 MG tablet  4.timolol (TIMOPTIC) 0.5 % ophthalmic solution  5.  6.  ____Fleets enema or Magnesium Citrate as directed.   _x___ Use CHG Soap or sage wipes as directed on instruction sheet   ____ Use inhalers on the day of surgery and bring to hospital day of surgery  _x___ Stop Metformin and Janumet 2 days prior to surgery.    ____ Take 1/2 of usual insulin dose the night before surgery and none on the morning     surgery.   _x___ Follow recommendations from Cardiologist, Pulmonologist or PCP regarding          stopping Aspirin, Coumadin, Plavix ,Eliquis, Effient, or Pradaxa, and Pletal.  X____Stop Anti-inflammatories such as Advil, Aleve, Ibuprofen, Motrin, Naproxen, Naprosyn, Goodies powders or aspirin products. OK to take Tylenol and                          Celebrex.   _x___ Stop supplements until after surgery.  But may continue Vitamin D, Vitamin B,        and multivitamin.   ____ Bring C-Pap to the hospital.

## 2019-05-11 ENCOUNTER — Encounter
Admission: RE | Admit: 2019-05-11 | Discharge: 2019-05-11 | Disposition: A | Discharge status: Home / Self Care | Payer: Medicare Other | Source: Ambulatory Visit | Attending: Neurosurgery | Admitting: Neurosurgery

## 2019-05-11 DIAGNOSIS — I10 Essential (primary) hypertension: Secondary | ICD-10-CM | POA: Diagnosis not present

## 2019-05-11 DIAGNOSIS — Z20828 Contact with and (suspected) exposure to other viral communicable diseases: Secondary | ICD-10-CM | POA: Diagnosis not present

## 2019-05-11 DIAGNOSIS — E785 Hyperlipidemia, unspecified: Secondary | ICD-10-CM | POA: Diagnosis not present

## 2019-05-11 DIAGNOSIS — Z01818 Encounter for other preprocedural examination: Secondary | ICD-10-CM | POA: Diagnosis not present

## 2019-05-12 ENCOUNTER — Other Ambulatory Visit: Payer: Medicare Other

## 2019-05-12 DIAGNOSIS — H401121 Primary open-angle glaucoma, left eye, mild stage: Secondary | ICD-10-CM | POA: Diagnosis not present

## 2019-05-12 DIAGNOSIS — Z961 Presence of intraocular lens: Secondary | ICD-10-CM | POA: Diagnosis not present

## 2019-05-12 DIAGNOSIS — H338 Other retinal detachments: Secondary | ICD-10-CM | POA: Diagnosis not present

## 2019-05-12 DIAGNOSIS — H401113 Primary open-angle glaucoma, right eye, severe stage: Secondary | ICD-10-CM | POA: Diagnosis not present

## 2019-05-12 DIAGNOSIS — E119 Type 2 diabetes mellitus without complications: Secondary | ICD-10-CM | POA: Diagnosis not present

## 2019-05-14 ENCOUNTER — Other Ambulatory Visit: Payer: Self-pay

## 2019-05-14 ENCOUNTER — Other Ambulatory Visit: Payer: Medicare Other

## 2019-05-14 ENCOUNTER — Other Ambulatory Visit
Admission: RE | Admit: 2019-05-14 | Discharge: 2019-05-14 | Disposition: A | Discharge status: Home / Self Care | Payer: Medicare Other | Source: Ambulatory Visit | Attending: Neurosurgery | Admitting: Neurosurgery

## 2019-05-14 DIAGNOSIS — E785 Hyperlipidemia, unspecified: Secondary | ICD-10-CM | POA: Diagnosis not present

## 2019-05-14 DIAGNOSIS — Z01818 Encounter for other preprocedural examination: Secondary | ICD-10-CM | POA: Diagnosis not present

## 2019-05-14 DIAGNOSIS — Z20828 Contact with and (suspected) exposure to other viral communicable diseases: Secondary | ICD-10-CM | POA: Diagnosis not present

## 2019-05-14 LAB — URINALYSIS, ROUTINE W REFLEX MICROSCOPIC
Bilirubin Urine: NEGATIVE
Glucose, UA: NEGATIVE mg/dL
Hgb urine dipstick: NEGATIVE
Ketones, ur: NEGATIVE mg/dL
Leukocytes,Ua: NEGATIVE
Nitrite: NEGATIVE
Protein, ur: NEGATIVE mg/dL
Specific Gravity, Urine: 1.009 (ref 1.005–1.030)
pH: 7 (ref 5.0–8.0)

## 2019-05-14 LAB — SARS CORONAVIRUS 2 (TAT 6-24 HRS): SARS Coronavirus 2: NEGATIVE

## 2019-05-18 DIAGNOSIS — D2272 Melanocytic nevi of left lower limb, including hip: Secondary | ICD-10-CM | POA: Diagnosis not present

## 2019-05-18 DIAGNOSIS — D2262 Melanocytic nevi of left upper limb, including shoulder: Secondary | ICD-10-CM | POA: Diagnosis not present

## 2019-05-18 DIAGNOSIS — L821 Other seborrheic keratosis: Secondary | ICD-10-CM | POA: Diagnosis not present

## 2019-05-18 DIAGNOSIS — D2261 Melanocytic nevi of right upper limb, including shoulder: Secondary | ICD-10-CM | POA: Diagnosis not present

## 2019-05-18 DIAGNOSIS — D225 Melanocytic nevi of trunk: Secondary | ICD-10-CM | POA: Diagnosis not present

## 2019-05-18 DIAGNOSIS — D2271 Melanocytic nevi of right lower limb, including hip: Secondary | ICD-10-CM | POA: Diagnosis not present

## 2019-05-19 ENCOUNTER — Inpatient Hospital Stay
Admission: RE | Admit: 2019-05-19 | Discharge: 2019-05-21 | DRG: 455 | Disposition: A | Discharge status: Home Health | Payer: Medicare Other | Attending: Neurosurgery | Admitting: Neurosurgery

## 2019-05-19 ENCOUNTER — Other Ambulatory Visit: Payer: Self-pay

## 2019-05-19 ENCOUNTER — Inpatient Hospital Stay: Payer: Medicare Other | Admitting: Certified Registered Nurse Anesthetist

## 2019-05-19 ENCOUNTER — Inpatient Hospital Stay: Payer: Medicare Other

## 2019-05-19 ENCOUNTER — Encounter
Admission: RE | Disposition: A | Discharge status: Home Health | Payer: Self-pay | Source: Home / Self Care | Attending: Neurosurgery

## 2019-05-19 ENCOUNTER — Encounter: Payer: Self-pay | Admitting: Emergency Medicine

## 2019-05-19 DIAGNOSIS — Z7982 Long term (current) use of aspirin: Secondary | ICD-10-CM | POA: Diagnosis not present

## 2019-05-19 DIAGNOSIS — D649 Anemia, unspecified: Secondary | ICD-10-CM | POA: Diagnosis present

## 2019-05-19 DIAGNOSIS — Z7984 Long term (current) use of oral hypoglycemic drugs: Secondary | ICD-10-CM

## 2019-05-19 DIAGNOSIS — Z981 Arthrodesis status: Secondary | ICD-10-CM

## 2019-05-19 DIAGNOSIS — M4186 Other forms of scoliosis, lumbar region: Secondary | ICD-10-CM | POA: Diagnosis present

## 2019-05-19 DIAGNOSIS — Z419 Encounter for procedure for purposes other than remedying health state, unspecified: Secondary | ICD-10-CM

## 2019-05-19 DIAGNOSIS — H409 Unspecified glaucoma: Secondary | ICD-10-CM | POA: Diagnosis present

## 2019-05-19 DIAGNOSIS — I10 Essential (primary) hypertension: Secondary | ICD-10-CM | POA: Diagnosis present

## 2019-05-19 DIAGNOSIS — Z79899 Other long term (current) drug therapy: Secondary | ICD-10-CM

## 2019-05-19 DIAGNOSIS — E785 Hyperlipidemia, unspecified: Secondary | ICD-10-CM | POA: Diagnosis not present

## 2019-05-19 DIAGNOSIS — E119 Type 2 diabetes mellitus without complications: Secondary | ICD-10-CM | POA: Diagnosis present

## 2019-05-19 DIAGNOSIS — E7800 Pure hypercholesterolemia, unspecified: Secondary | ICD-10-CM | POA: Diagnosis present

## 2019-05-19 DIAGNOSIS — M810 Age-related osteoporosis without current pathological fracture: Secondary | ICD-10-CM | POA: Diagnosis present

## 2019-05-19 DIAGNOSIS — M5416 Radiculopathy, lumbar region: Secondary | ICD-10-CM | POA: Diagnosis not present

## 2019-05-19 DIAGNOSIS — M5116 Intervertebral disc disorders with radiculopathy, lumbar region: Secondary | ICD-10-CM | POA: Diagnosis present

## 2019-05-19 DIAGNOSIS — M47816 Spondylosis without myelopathy or radiculopathy, lumbar region: Secondary | ICD-10-CM | POA: Diagnosis not present

## 2019-05-19 DIAGNOSIS — K219 Gastro-esophageal reflux disease without esophagitis: Secondary | ICD-10-CM | POA: Diagnosis present

## 2019-05-19 DIAGNOSIS — K449 Diaphragmatic hernia without obstruction or gangrene: Secondary | ICD-10-CM | POA: Diagnosis present

## 2019-05-19 DIAGNOSIS — M419 Scoliosis, unspecified: Secondary | ICD-10-CM | POA: Diagnosis not present

## 2019-05-19 HISTORY — PX: ANTERIOR LATERAL LUMBAR FUSION WITH PERCUTANEOUS SCREW 2 LEVEL: SHX5554

## 2019-05-19 LAB — GLUCOSE, CAPILLARY
Glucose-Capillary: 111 mg/dL — ABNORMAL HIGH (ref 70–99)
Glucose-Capillary: 120 mg/dL — ABNORMAL HIGH (ref 70–99)
Glucose-Capillary: 135 mg/dL — ABNORMAL HIGH (ref 70–99)

## 2019-05-19 LAB — ABO/RH: ABO/RH(D): A POS

## 2019-05-19 SURGERY — ANTERIOR LATERAL LUMBAR FUSION WITH PERCUTANEOUS SCREW 2 LEVEL
Anesthesia: General | Site: Back

## 2019-05-19 MED ORDER — FENTANYL CITRATE (PF) 100 MCG/2ML IJ SOLN
INTRAMUSCULAR | Status: AC
  Administered 2019-05-19: 50 ug via INTRAVENOUS
  Filled 2019-05-19: qty 2

## 2019-05-19 MED ORDER — KETOROLAC TROMETHAMINE 30 MG/ML IJ SOLN
30.0000 mg | Freq: Once | INTRAMUSCULAR | Status: AC
  Administered 2019-05-19: 19:00:00 30 mg via INTRAVENOUS
  Filled 2019-05-19: qty 1

## 2019-05-19 MED ORDER — OXYCODONE HCL 5 MG PO TABS
5.0000 mg | ORAL_TABLET | ORAL | Status: DC | PRN
  Administered 2019-05-19 – 2019-05-21 (×2): 5 mg via ORAL
  Filled 2019-05-19 (×2): qty 1

## 2019-05-19 MED ORDER — THROMBIN 5000 UNITS EX SOLR
CUTANEOUS | Status: AC
  Filled 2019-05-19: qty 5000

## 2019-05-19 MED ORDER — FENTANYL CITRATE (PF) 100 MCG/2ML IJ SOLN
INTRAMUSCULAR | Status: DC | PRN
  Administered 2019-05-19 (×2): 25 ug via INTRAVENOUS
  Administered 2019-05-19: 50 ug via INTRAVENOUS

## 2019-05-19 MED ORDER — MAGNESIUM CITRATE PO SOLN
1.0000 | Freq: Once | ORAL | Status: DC | PRN
  Filled 2019-05-19: qty 296

## 2019-05-19 MED ORDER — BUPIVACAINE HCL (PF) 0.5 % IJ SOLN
INTRAMUSCULAR | Status: AC
  Filled 2019-05-19: qty 30

## 2019-05-19 MED ORDER — PHENYLEPHRINE HCL (PRESSORS) 10 MG/ML IV SOLN
INTRAVENOUS | Status: AC
  Filled 2019-05-19: qty 1

## 2019-05-19 MED ORDER — MENTHOL 3 MG MT LOZG
1.0000 | LOZENGE | OROMUCOSAL | Status: DC | PRN
  Filled 2019-05-19: qty 9

## 2019-05-19 MED ORDER — HYDROMORPHONE HCL 1 MG/ML IJ SOLN: 0.5000 mg | INTRAMUSCULAR | Status: DC | PRN

## 2019-05-19 MED ORDER — BUPIVACAINE HCL (PF) 0.5 % IJ SOLN
INTRAMUSCULAR | Status: DC | PRN
  Administered 2019-05-19: 20 mL

## 2019-05-19 MED ORDER — PANTOPRAZOLE SODIUM 40 MG PO TBEC
40.0000 mg | DELAYED_RELEASE_TABLET | Freq: Two times a day (BID) | ORAL | Status: DC
  Administered 2019-05-19 – 2019-05-21 (×4): 40 mg via ORAL
  Filled 2019-05-19 (×4): qty 1

## 2019-05-19 MED ORDER — ENALAPRIL MALEATE 20 MG PO TABS
20.0000 mg | ORAL_TABLET | Freq: Every day | ORAL | Status: DC
  Administered 2019-05-19 – 2019-05-21 (×3): 20 mg via ORAL
  Filled 2019-05-19 (×3): qty 1

## 2019-05-19 MED ORDER — PROPOFOL 500 MG/50ML IV EMUL
INTRAVENOUS | Status: DC | PRN
  Administered 2019-05-19: 100 ug/kg/min via INTRAVENOUS

## 2019-05-19 MED ORDER — SODIUM CHLORIDE 0.9% FLUSH: 3.0000 mL | INTRAVENOUS | Status: DC | PRN

## 2019-05-19 MED ORDER — FENTANYL CITRATE (PF) 100 MCG/2ML IJ SOLN
25.0000 ug | INTRAMUSCULAR | Status: DC | PRN
  Administered 2019-05-19 (×3): 50 ug via INTRAVENOUS

## 2019-05-19 MED ORDER — OXYCODONE HCL 5 MG/5ML PO SOLN: 5.0000 mg | Freq: Once | ORAL | Status: DC | PRN

## 2019-05-19 MED ORDER — SUCCINYLCHOLINE CHLORIDE 20 MG/ML IJ SOLN
INTRAMUSCULAR | Status: DC | PRN
  Administered 2019-05-19: 60 mg via INTRAVENOUS

## 2019-05-19 MED ORDER — INSULIN ASPART 100 UNIT/ML ~~LOC~~ SOLN
0.0000 [IU] | Freq: Three times a day (TID) | SUBCUTANEOUS | Status: DC
  Administered 2019-05-20 – 2019-05-21 (×2): 2 [IU] via SUBCUTANEOUS
  Filled 2019-05-19: qty 1

## 2019-05-19 MED ORDER — METFORMIN HCL 500 MG PO TABS
500.0000 mg | ORAL_TABLET | Freq: Every day | ORAL | Status: DC
  Administered 2019-05-20 – 2019-05-21 (×2): 500 mg via ORAL
  Filled 2019-05-19 (×2): qty 1

## 2019-05-19 MED ORDER — METHOCARBAMOL 500 MG PO TABS
750.0000 mg | ORAL_TABLET | Freq: Four times a day (QID) | ORAL | Status: DC
  Administered 2019-05-20 – 2019-05-21 (×7): 750 mg via ORAL
  Filled 2019-05-19 (×6): qty 2
  Filled 2019-05-19: qty 1
  Filled 2019-05-19: qty 2

## 2019-05-19 MED ORDER — LATANOPROST 0.005 % OP SOLN
1.0000 [drp] | Freq: Every day | OPHTHALMIC | Status: DC
  Administered 2019-05-19 – 2019-05-20 (×2): 1 [drp] via OPHTHALMIC
  Filled 2019-05-19: qty 2.5

## 2019-05-19 MED ORDER — BACITRACIN 50000 UNITS IM SOLR
INTRAMUSCULAR | Status: AC
  Filled 2019-05-19: qty 1

## 2019-05-19 MED ORDER — DEXAMETHASONE SODIUM PHOSPHATE 10 MG/ML IJ SOLN
INTRAMUSCULAR | Status: AC
  Filled 2019-05-19: qty 1

## 2019-05-19 MED ORDER — SODIUM CHLORIDE 0.9 % IV SOLN
INTRAVENOUS | Status: DC | PRN
  Administered 2019-05-19: 40 ug/min via INTRAVENOUS

## 2019-05-19 MED ORDER — DORZOLAMIDE HCL-TIMOLOL MAL 2-0.5 % OP SOLN
1.0000 [drp] | Freq: Two times a day (BID) | OPHTHALMIC | Status: DC
  Administered 2019-05-19 – 2019-05-21 (×4): 1 [drp] via OPHTHALMIC
  Filled 2019-05-19: qty 10

## 2019-05-19 MED ORDER — SODIUM CHLORIDE 0.9% FLUSH
3.0000 mL | Freq: Two times a day (BID) | INTRAVENOUS | Status: DC
  Administered 2019-05-19 – 2019-05-20 (×3): 3 mL via INTRAVENOUS

## 2019-05-19 MED ORDER — PHENYLEPHRINE HCL (PRESSORS) 10 MG/ML IV SOLN
INTRAVENOUS | Status: DC | PRN
  Administered 2019-05-19: 200 ug via INTRAVENOUS
  Administered 2019-05-19: 100 ug via INTRAVENOUS

## 2019-05-19 MED ORDER — ACETAMINOPHEN 325 MG PO TABS
650.0000 mg | ORAL_TABLET | ORAL | Status: DC | PRN
  Filled 2019-05-19: qty 2

## 2019-05-19 MED ORDER — SODIUM CHLORIDE 0.9 % IV SOLN
INTRAVENOUS | Status: DC | PRN
  Administered 2019-05-19: .1 ug/kg/min via INTRAVENOUS

## 2019-05-19 MED ORDER — REMIFENTANIL HCL 1 MG IV SOLR
INTRAVENOUS | Status: AC
  Filled 2019-05-19: qty 2000

## 2019-05-19 MED ORDER — SENNA 8.6 MG PO TABS
1.0000 | ORAL_TABLET | Freq: Two times a day (BID) | ORAL | Status: DC
  Administered 2019-05-19 – 2019-05-21 (×4): 8.6 mg via ORAL
  Filled 2019-05-19 (×4): qty 1

## 2019-05-19 MED ORDER — OXYCODONE HCL 5 MG PO TABS: 5.0000 mg | ORAL_TABLET | Freq: Once | ORAL | Status: DC | PRN

## 2019-05-19 MED ORDER — KETOROLAC TROMETHAMINE 30 MG/ML IJ SOLN
15.0000 mg | Freq: Four times a day (QID) | INTRAMUSCULAR | Status: DC
  Administered 2019-05-20 – 2019-05-21 (×5): 15 mg via INTRAVENOUS
  Filled 2019-05-19 (×5): qty 1

## 2019-05-19 MED ORDER — PHENOL 1.4 % MT LIQD
1.0000 | OROMUCOSAL | Status: DC | PRN
  Filled 2019-05-19: qty 177

## 2019-05-19 MED ORDER — FENTANYL CITRATE (PF) 100 MCG/2ML IJ SOLN
INTRAMUSCULAR | Status: AC
  Filled 2019-05-19: qty 2

## 2019-05-19 MED ORDER — ONDANSETRON HCL 4 MG PO TABS: 4.0000 mg | ORAL_TABLET | Freq: Four times a day (QID) | ORAL | Status: DC | PRN

## 2019-05-19 MED ORDER — ONDANSETRON HCL 4 MG/2ML IJ SOLN
INTRAMUSCULAR | Status: DC | PRN
  Administered 2019-05-19: 4 mg via INTRAVENOUS

## 2019-05-19 MED ORDER — VANCOMYCIN HCL IN DEXTROSE 1-5 GM/200ML-% IV SOLN
INTRAVENOUS | Status: AC
  Administered 2019-05-19: 14:00:00 1000 mg via INTRAVENOUS
  Filled 2019-05-19: qty 200

## 2019-05-19 MED ORDER — PROPOFOL 10 MG/ML IV BOLUS
INTRAVENOUS | Status: AC
  Filled 2019-05-19: qty 20

## 2019-05-19 MED ORDER — LIDOCAINE HCL (CARDIAC) PF 100 MG/5ML IV SOSY
PREFILLED_SYRINGE | INTRAVENOUS | Status: DC | PRN
  Administered 2019-05-19: 50 mg via INTRAVENOUS

## 2019-05-19 MED ORDER — LIDOCAINE HCL (PF) 2 % IJ SOLN
INTRAMUSCULAR | Status: AC
  Filled 2019-05-19: qty 10

## 2019-05-19 MED ORDER — ATORVASTATIN CALCIUM 20 MG PO TABS
20.0000 mg | ORAL_TABLET | Freq: Every evening | ORAL | Status: DC
  Administered 2019-05-19 – 2019-05-20 (×2): 20 mg via ORAL
  Filled 2019-05-19 (×2): qty 1

## 2019-05-19 MED ORDER — SODIUM CHLORIDE FLUSH 0.9 % IV SOLN
INTRAVENOUS | Status: AC
  Filled 2019-05-19: qty 10

## 2019-05-19 MED ORDER — SUCCINYLCHOLINE CHLORIDE 20 MG/ML IJ SOLN
INTRAMUSCULAR | Status: AC
  Filled 2019-05-19: qty 1

## 2019-05-19 MED ORDER — SODIUM CHLORIDE 0.9 % IV SOLN
INTRAVENOUS | Status: DC
  Administered 2019-05-19: 12:00:00 via INTRAVENOUS

## 2019-05-19 MED ORDER — OXYCODONE HCL 5 MG PO TABS: 10.0000 mg | ORAL_TABLET | ORAL | Status: DC | PRN

## 2019-05-19 MED ORDER — THROMBIN 5000 UNITS EX SOLR
CUTANEOUS | Status: DC | PRN
  Administered 2019-05-19: 5000 [IU] via TOPICAL

## 2019-05-19 MED ORDER — BUPIVACAINE-EPINEPHRINE 0.5% -1:200000 IJ SOLN
INTRAMUSCULAR | Status: DC | PRN
  Administered 2019-05-19: 6 mL
  Administered 2019-05-19: 10 mL

## 2019-05-19 MED ORDER — SODIUM CHLORIDE 0.9 % IV SOLN
INTRAVENOUS | Status: DC | PRN
  Administered 2019-05-19: 40 mL

## 2019-05-19 MED ORDER — SODIUM CHLORIDE FLUSH 0.9 % IV SOLN
INTRAVENOUS | Status: AC
  Filled 2019-05-19: qty 30

## 2019-05-19 MED ORDER — POLYETHYLENE GLYCOL 3350 17 G PO PACK: 17.0000 g | PACK | Freq: Every day | ORAL | Status: DC | PRN

## 2019-05-19 MED ORDER — SODIUM CHLORIDE 0.9 % IV SOLN: 250.0000 mL | INTRAVENOUS | Status: DC

## 2019-05-19 MED ORDER — EPHEDRINE SULFATE 50 MG/ML IJ SOLN
INTRAMUSCULAR | Status: DC | PRN
  Administered 2019-05-19: 10 mg via INTRAVENOUS

## 2019-05-19 MED ORDER — ACETAMINOPHEN 10 MG/ML IV SOLN
INTRAVENOUS | Status: DC | PRN
  Administered 2019-05-19: 1000 mg via INTRAVENOUS

## 2019-05-19 MED ORDER — ONDANSETRON HCL 4 MG/2ML IJ SOLN: 4.0000 mg | Freq: Four times a day (QID) | INTRAMUSCULAR | Status: DC | PRN

## 2019-05-19 MED ORDER — CEFAZOLIN SODIUM-DEXTROSE 2-4 GM/100ML-% IV SOLN
2.0000 g | Freq: Once | INTRAVENOUS | Status: AC
  Administered 2019-05-19: 2 g via INTRAVENOUS

## 2019-05-19 MED ORDER — SODIUM CHLORIDE 0.9 % IR SOLN
Status: DC | PRN
  Administered 2019-05-19: 15:00:00 1000 mL

## 2019-05-19 MED ORDER — SODIUM CHLORIDE 0.9 % IV SOLN
INTRAVENOUS | Status: DC
  Administered 2019-05-19: 23:00:00 via INTRAVENOUS

## 2019-05-19 MED ORDER — KETAMINE HCL 50 MG/ML IJ SOLN
INTRAMUSCULAR | Status: DC | PRN
  Administered 2019-05-19: 50 mg via INTRAMUSCULAR

## 2019-05-19 MED ORDER — TIMOLOL MALEATE 0.5 % OP SOLN
1.0000 [drp] | Freq: Every day | OPHTHALMIC | Status: DC
  Administered 2019-05-20 – 2019-05-21 (×2): 1 [drp] via OPHTHALMIC
  Filled 2019-05-19: qty 5

## 2019-05-19 MED ORDER — ACETAMINOPHEN 500 MG PO TABS
1000.0000 mg | ORAL_TABLET | Freq: Four times a day (QID) | ORAL | Status: AC
  Administered 2019-05-20 (×4): 1000 mg via ORAL
  Filled 2019-05-19 (×3): qty 2

## 2019-05-19 MED ORDER — CEFAZOLIN SODIUM-DEXTROSE 2-4 GM/100ML-% IV SOLN
INTRAVENOUS | Status: AC
  Filled 2019-05-19: qty 100

## 2019-05-19 MED ORDER — VANCOMYCIN HCL IN DEXTROSE 1-5 GM/200ML-% IV SOLN
1000.0000 mg | Freq: Once | INTRAVENOUS | Status: AC
  Administered 2019-05-19: 14:00:00 1000 mg via INTRAVENOUS

## 2019-05-19 MED ORDER — BISACODYL 5 MG PO TBEC: 5.0000 mg | DELAYED_RELEASE_TABLET | Freq: Every day | ORAL | Status: DC | PRN

## 2019-05-19 MED ORDER — EPHEDRINE SULFATE 50 MG/ML IJ SOLN
INTRAMUSCULAR | Status: AC
  Filled 2019-05-19: qty 1

## 2019-05-19 MED ORDER — ACETAMINOPHEN 10 MG/ML IV SOLN
INTRAVENOUS | Status: AC
  Filled 2019-05-19: qty 100

## 2019-05-19 MED ORDER — DIAZEPAM 5 MG PO TABS
5.0000 mg | ORAL_TABLET | Freq: Once | ORAL | Status: DC
  Filled 2019-05-19: qty 1

## 2019-05-19 MED ORDER — LIDOCAINE HCL 4 % MT SOLN
OROMUCOSAL | Status: DC | PRN
  Administered 2019-05-19: 4 mL via TOPICAL

## 2019-05-19 MED ORDER — LACTATED RINGERS IV SOLN
INTRAVENOUS | Status: DC | PRN
  Administered 2019-05-19: 15:00:00 via INTRAVENOUS

## 2019-05-19 MED ORDER — DEXAMETHASONE SODIUM PHOSPHATE 10 MG/ML IJ SOLN
INTRAMUSCULAR | Status: DC | PRN
  Administered 2019-05-19: 10 mg via INTRAVENOUS

## 2019-05-19 MED ORDER — BUPIVACAINE LIPOSOME 1.3 % IJ SUSP
INTRAMUSCULAR | Status: AC
  Filled 2019-05-19: qty 20

## 2019-05-19 MED ORDER — ACETAMINOPHEN 650 MG RE SUPP: 650.0000 mg | RECTAL | Status: DC | PRN

## 2019-05-19 MED ORDER — PROPOFOL 500 MG/50ML IV EMUL
INTRAVENOUS | Status: AC
  Filled 2019-05-19: qty 50

## 2019-05-19 MED ORDER — METHOCARBAMOL 1000 MG/10ML IJ SOLN
500.0000 mg | Freq: Four times a day (QID) | INTRAVENOUS | Status: DC
  Administered 2019-05-19: 500 mg via INTRAVENOUS
  Filled 2019-05-19 (×12): qty 5

## 2019-05-19 MED ORDER — ONDANSETRON HCL 4 MG/2ML IJ SOLN
INTRAMUSCULAR | Status: AC
  Filled 2019-05-19: qty 2

## 2019-05-19 MED ORDER — KETOROLAC TROMETHAMINE 30 MG/ML IJ SOLN
INTRAMUSCULAR | Status: AC
  Administered 2019-05-19: 19:00:00 30 mg via INTRAVENOUS
  Filled 2019-05-19: qty 1

## 2019-05-19 MED ORDER — SPIRONOLACTONE 25 MG PO TABS
25.0000 mg | ORAL_TABLET | Freq: Every evening | ORAL | Status: DC
  Administered 2019-05-19 – 2019-05-20 (×2): 25 mg via ORAL
  Filled 2019-05-19 (×2): qty 1

## 2019-05-19 MED ORDER — BUPIVACAINE-EPINEPHRINE (PF) 0.5% -1:200000 IJ SOLN
INTRAMUSCULAR | Status: AC
  Filled 2019-05-19: qty 30

## 2019-05-19 SURGICAL SUPPLY — 85 items
BUR NEURO DRILL SOFT 3.0X3.8M (BURR) ×3 IMPLANT
CAGE MODULUS XL 10X18X45 - 10 (Cage) ×3 IMPLANT
CAGE MODULUS XL 8X18X50 - 10 (Cage) ×3 IMPLANT
CANISTER SUCT 1200ML W/VALVE (MISCELLANEOUS) ×6 IMPLANT
CHLORAPREP W/TINT 26 (MISCELLANEOUS) ×12 IMPLANT
CORD BIP STRL DISP 12FT (MISCELLANEOUS) ×3 IMPLANT
COUNTER NEEDLE 20/40 LG (NEEDLE) ×3 IMPLANT
COVER BACK TABLE REUSABLE LG (DRAPES) ×3 IMPLANT
COVER LIGHT HANDLE STERIS (MISCELLANEOUS) ×12 IMPLANT
COVER WAND RF STERILE (DRAPES) ×3 IMPLANT
CRADLE LAMINECT ARM (MISCELLANEOUS) ×6 IMPLANT
CUP MEDICINE 2OZ PLAST GRAD ST (MISCELLANEOUS) ×3 IMPLANT
DERMABOND ADVANCED (GAUZE/BANDAGES/DRESSINGS) ×4
DERMABOND ADVANCED .7 DNX12 (GAUZE/BANDAGES/DRESSINGS) ×2 IMPLANT
DRAPE C-ARM 42X72 X-RAY (DRAPES) ×6 IMPLANT
DRAPE C-ARMOR (DRAPES) ×6 IMPLANT
DRAPE INCISE IOBAN 66X45 STRL (DRAPES) ×6 IMPLANT
DRAPE LAPAROTOMY 100X77 ABD (DRAPES) ×6 IMPLANT
DRAPE MICROSCOPE SPINE 48X150 (DRAPES) ×3 IMPLANT
DRAPE POUCH INSTRU U-SHP 10X18 (DRAPES) ×3 IMPLANT
DRAPE SURG 17X11 SM STRL (DRAPES) ×24 IMPLANT
DRSG OPSITE POSTOP 4X6 (GAUZE/BANDAGES/DRESSINGS) IMPLANT
DRSG TEGADERM 2-3/8X2-3/4 SM (GAUZE/BANDAGES/DRESSINGS) IMPLANT
DRSG TEGADERM 4X4.75 (GAUZE/BANDAGES/DRESSINGS) IMPLANT
DRSG TEGADERM 6X8 (GAUZE/BANDAGES/DRESSINGS) IMPLANT
DRSG TELFA 3X8 NADH (GAUZE/BANDAGES/DRESSINGS) IMPLANT
DRSG TELFA 4X3 1S NADH ST (GAUZE/BANDAGES/DRESSINGS) IMPLANT
ELECT CAUTERY BLADE TIP 2.5 (TIP) ×6
ELECT EZSTD 165MM 6.5IN (MISCELLANEOUS) ×3
ELECT REM PT RETURN 9FT ADLT (ELECTROSURGICAL) ×6
ELECTRODE CAUTERY BLDE TIP 2.5 (TIP) ×2 IMPLANT
ELECTRODE EZSTD 165MM 6.5IN (MISCELLANEOUS) ×1 IMPLANT
ELECTRODE REM PT RTRN 9FT ADLT (ELECTROSURGICAL) ×2 IMPLANT
FEE INTRAOP MONITOR IMPULS NCS (MISCELLANEOUS) IMPLANT
FRAME EYE SHIELD (PROTECTIVE WEAR) ×6 IMPLANT
GLOVE BIOGEL PI IND STRL 7.0 (GLOVE) ×2 IMPLANT
GLOVE BIOGEL PI INDICATOR 7.0 (GLOVE) ×4
GLOVE SURG SYN 7.0 (GLOVE) ×12 IMPLANT
GLOVE SURG SYN 8.5  E (GLOVE) ×12
GLOVE SURG SYN 8.5 E (GLOVE) ×6 IMPLANT
GOWN SRG XL LVL 3 NONREINFORCE (GOWNS) ×2 IMPLANT
GOWN STRL NON-REIN TWL XL LVL3 (GOWNS) ×4
GOWN STRL REUS W/TWL MED LVL3 (GOWN DISPOSABLE) ×6 IMPLANT
GRADUATE 1200CC STRL 31836 (MISCELLANEOUS) ×3 IMPLANT
GUIDEWIRE NITINOL BEVEL TIP (WIRE) ×18 IMPLANT
INTRAOP MONITOR FEE IMPULS NCS (MISCELLANEOUS)
INTRAOP MONITOR FEE IMPULSE (MISCELLANEOUS)
KIT DILATOR XLIF 5 (KITS) ×2 IMPLANT
KIT NEEDLE NVM5 EMG ELECT (KITS) ×2 IMPLANT
KIT NEEDLE NVM5 EMG ELECTRODE (KITS) ×1
KIT SPINAL PRONEVIEW (KITS) ×3 IMPLANT
KIT SURGICAL ACCESS MAXCESS 4 (KITS) ×3 IMPLANT
KIT TURNOVER KIT A (KITS) ×3 IMPLANT
KIT XLIF (KITS) ×1
KNIFE BAYONET SHORT DISCETOMY (MISCELLANEOUS) IMPLANT
MARKER SKIN DUAL TIP RULER LAB (MISCELLANEOUS) ×9 IMPLANT
NDL SAFETY ECLIPSE 18X1.5 (NEEDLE) ×1 IMPLANT
NEEDLE HYPO 18GX1.5 SHARP (NEEDLE) ×2
NEEDLE HYPO 22GX1.5 SAFETY (NEEDLE) ×3 IMPLANT
NEEDLE I PASS (NEEDLE) ×3 IMPLANT
PACK LAMINECTOMY NEURO (CUSTOM PROCEDURE TRAY) ×3 IMPLANT
PAD ARMBOARD 7.5X6 YLW CONV (MISCELLANEOUS) ×3 IMPLANT
PENCIL ELECTRO HAND CTR (MISCELLANEOUS) ×3 IMPLANT
PUTTY DBM PROPEL LRG (Putty) ×2 IMPLANT
PUTTY PROPEL LRG (Putty) ×1 IMPLANT
ROD RELINE MAS LORDOTIC 5.5X60 (Rod) ×3 IMPLANT
ROD RELINE TI MAS LD 5.5X65 (Rod) ×3 IMPLANT
SCREW LOCK RELINE 5.5 TULIP (Screw) ×18 IMPLANT
SCREW RELINE RED 6.5X45MM POLY (Screw) ×18 IMPLANT
SPOGE SURGIFLO 8M (HEMOSTASIS) ×2
SPONGE GAUZE 2X2 8PLY STER LF (GAUZE/BANDAGES/DRESSINGS)
SPONGE GAUZE 2X2 8PLY STRL LF (GAUZE/BANDAGES/DRESSINGS) IMPLANT
SPONGE SURGIFLO 8M (HEMOSTASIS) ×1 IMPLANT
STAPLER SKIN PROX 35W (STAPLE) IMPLANT
SUT DVC VLOC 3-0 CL 6 P-12 (SUTURE) ×3 IMPLANT
SUT ETHILON 3-0 FS-10 30 BLK (SUTURE)
SUT VIC AB 0 CT1 27 (SUTURE) ×2
SUT VIC AB 0 CT1 27XCR 8 STRN (SUTURE) ×1 IMPLANT
SUT VIC AB 2-0 CT1 18 (SUTURE) ×3 IMPLANT
SUTURE EHLN 3-0 FS-10 30 BLK (SUTURE) IMPLANT
SYR 30ML LL (SYRINGE) ×6 IMPLANT
TOWEL OR 17X26 4PK STRL BLUE (TOWEL DISPOSABLE) ×9 IMPLANT
TRAY FOLEY MTR SLVR 16FR STAT (SET/KITS/TRAYS/PACK) IMPLANT
TUBING CONNECTING 10 (TUBING) ×6 IMPLANT
TUBING CONNECTING 10' (TUBING) ×3

## 2019-05-19 NOTE — Anesthesia Post-op Follow-up Note (Signed)
Anesthesia QCDR form completed.        

## 2019-05-19 NOTE — Anesthesia Procedure Notes (Signed)
Procedure Name: Intubation Date/Time: 05/19/2019 2:43 PM Performed by: Eben Burow, CRNA Pre-anesthesia Checklist: Patient identified, Emergency Drugs available, Suction available and Patient being monitored Patient Re-evaluated:Patient Re-evaluated prior to induction Oxygen Delivery Method: Circle system utilized Preoxygenation: Pre-oxygenation with 100% oxygen Induction Type: IV induction Ventilation: Mask ventilation without difficulty Laryngoscope Size: McGraph and 3 Grade View: Grade I Tube type: Oral Tube size: 7.0 mm Number of attempts: 1 Airway Equipment and Method: Stylet,  Video-laryngoscopy and LTA kit utilized Placement Confirmation: positive ETCO2 and breath sounds checked- equal and bilateral Secured at: 21 cm Tube secured with: Tape Dental Injury: Teeth and Oropharynx as per pre-operative assessment

## 2019-05-19 NOTE — Transfer of Care (Signed)
Immediate Anesthesia Transfer of Care Note  Patient: Lori Harrell  Procedure(s) Performed: L2-4 LATERAL LUMBAR INTERBODY FUSION (XLIF) (N/A Back)  Patient Location: PACU  Anesthesia Type:General  Level of Consciousness: patient cooperative  Airway & Oxygen Therapy: Patient Spontanous Breathing and Patient connected to face mask oxygen  Post-op Assessment: Report given to RN and Post -op Vital signs reviewed and stable  Post vital signs: Reviewed and stable  Last Vitals:  Vitals Value Taken Time  BP 96/52 05/19/19 1832  Temp    Pulse 69 05/19/19 1836  Resp 23 05/19/19 1836  SpO2 100 % 05/19/19 1836  Vitals shown include unvalidated device data.  Last Pain:  Vitals:   05/19/19 1832  TempSrc:   PainSc: (P) Asleep         Complications: No apparent anesthesia complications

## 2019-05-19 NOTE — Anesthesia Preprocedure Evaluation (Signed)
Anesthesia Evaluation  Patient identified by MRN, date of birth, ID band Patient awake    Reviewed: Allergy & Precautions, H&P , NPO status , Patient's Chart, lab work & pertinent test results  History of Anesthesia Complications Negative for: history of anesthetic complications  Airway Mallampati: III  TM Distance: >3 FB Neck ROM: limited    Dental  (+) Chipped   Pulmonary neg pulmonary ROS, neg shortness of breath,           Cardiovascular Exercise Tolerance: Good hypertension, (-) angina(-) Past MI and (-) DOE      Neuro/Psych negative neurological ROS  negative psych ROS   GI/Hepatic Neg liver ROS, hiatal hernia, GERD  Medicated and Controlled,  Endo/Other  diabetes, Type 2  Renal/GU      Musculoskeletal   Abdominal   Peds  Hematology negative hematology ROS (+)   Anesthesia Other Findings Past Medical History: No date: Acid reflux 02/01/2019: Acute cholangitis 07/07/2017: AVN (avascular necrosis of bone) (HCC) No date: Choledocholithiasis No date: Diabetes mellitus without complication (HCC) No date: Elevated lipids No date: Hypertension  Past Surgical History: No date: CATARACT EXTRACTION 02/05/2019: CHOLECYSTECTOMY, LAPAROSCOPIC     Comment:  [facility] 2007: COLONOSCOPY     Comment:  sigmoid diverticulosis 02/02/2019: ENDOSCOPIC RETROGRADE CHOLANGIOPANCREATOGRAPHY (ERCP) WITH  PROPOFOL; N/A     Comment:  Procedure: ENDOSCOPIC RETROGRADE               CHOLANGIOPANCREATOGRAPHY (ERCP) WITH PROPOFOL;  Surgeon:               Lucilla Lame, MD;  Location: [facility] ENDOSCOPY;  Service:               Endoscopy;  Laterality: N/A; No date: TONSILLECTOMY 02/2013: UPPER GASTROINTESTINAL ENDOSCOPY     Comment:  Barrett's - done at [facility] No date: VAGINAL HYSTERECTOMY  BMI    Body Mass Index: 21.92 kg/m      Reproductive/Obstetrics negative OB ROS                             Anesthesia  Physical Anesthesia Plan  ASA: III  Anesthesia Plan: General ETT   Post-op Pain Management:    Induction: Intravenous  PONV Risk Score and Plan: Ondansetron, Dexamethasone, Midazolam and Treatment may vary due to age or medical condition  Airway Management Planned: Oral ETT  Additional Equipment:   Intra-op Plan:   Post-operative Plan: Extubation in OR  Informed Consent: I have reviewed the patients History and Physical, chart, labs and discussed the procedure including the risks, benefits and alternatives for the proposed anesthesia with the patient or authorized representative who has indicated his/her understanding and acceptance.     Dental Advisory Given  Plan Discussed with: Anesthesiologist, CRNA and Surgeon  Anesthesia Plan Comments: (Patient consented for risks of anesthesia including but not limited to:  - adverse reactions to medications - damage to teeth, lips or other oral mucosa - sore throat or hoarseness - Damage to heart, brain, lungs or loss of life  Patient voiced understanding.)        Anesthesia Quick Evaluation

## 2019-05-19 NOTE — Anesthesia Postprocedure Evaluation (Signed)
Anesthesia Post Note  Patient: Lori Harrell  Procedure(s) Performed: L2-4 LATERAL LUMBAR INTERBODY FUSION (XLIF) (N/A Back)  Patient location during evaluation: PACU Anesthesia Type: General Level of consciousness: awake and alert Pain management: pain level controlled Vital Signs Assessment: post-procedure vital signs reviewed and stable Respiratory status: spontaneous breathing, nonlabored ventilation and respiratory function stable Cardiovascular status: blood pressure returned to baseline and stable Postop Assessment: no apparent nausea or vomiting Anesthetic complications: no     Last Vitals:  Vitals:   05/19/19 1939 05/19/19 1947  BP:  125/84  Pulse: 73 71  Resp: 14 18  Temp:    SpO2: 100% 99%    Last Pain:  Vitals:   05/19/19 1939  TempSrc:   PainSc: [place]

## 2019-05-19 NOTE — Op Note (Signed)
Indications: Ms. 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  She failed conservative management and elected for surgical intervention.  Findings: correction of deformity  Preoperative Diagnosis: lumbar radiculopathy, acquired scoliosis Postoperative Diagnosis: same   EBL: 100 ml IVF: 1000 ml Drains: none Disposition: Extubated and Stable to PACU Complications: none  A foley catheter was placed.   Preoperative Note:   Risks of surgery discussed include: infection, bleeding, stroke, coma, death, paralysis, CSF leak, nerve/spinal cord injury, numbness, tingling, weakness, complex regional pain syndrome, recurrent stenosis and/or disc herniation, vascular injury, development of instability, neck/back pain, need for further surgery, persistent symptoms, development of deformity, and the risks of anesthesia. The patient understood these risks and agreed to proceed.  NAME OF ANTERIOR PROCEDURE:               1. Anterior lumbar interbody fusion via a left lateral retroperitoneal approach at L2/3 and L3/4 2. Placement of a Lordotic Modulus  [id number] interbody cage, filled with Demineralized Bone Matrix at L2/3 3. Placement of a Lordotic Modulus  [id number] interbody cage, filled with Demineralized Bone Matrix at L3/4   NAME OF POSTERIOR PROCEDURE: 1. Posterior instrumentation using   Nuvasive Reline Instrumentation 2. Posterolateral fusion, L2-4      PROCEDURE:  Patient was brought to the operating room, intubated, turned to the lateral position.  All pressure points were checked and double-checked.  The patient was prepped and draped in the standard fashion. Prior to prepping, fluoroscopy was brought in and the patient was positioned with a large bump under the contralateral side between the iliac crest and rib cage, allowing the area between the iliac crest and the lateral aspect of the rib cage to open and increase the ability to reach  inferiorly, to facilitate entry into the disc space.  The incision was marked upon the skin both the location of the disc space as well as the superior most aspect of the iliac crest.  Based on the identification of the disc space an incision was prepared, marked upon the skin and eventually was used for our lateral incision.  The fluoroscopy was turned into a cross table A/P image in order to confirm that the patient's spine remained in a perpendicular trajectory to the floor without rotation.  Once confirming that all the pressure points were checked and double-checked and the patient remained in sturdy position strapped down in this slightly jack-knifed lateral position, the patient was prepped and draped in standard fashion.  The skin was injected with local anesthetic, then incised until the abdominal wall fascia was noted.  I bluntly dissected posteriorly until we were able to identify the posterior musculature near petit's triangle.  At this point, using primarily blunt dissection with our finger aided with a metzenbaum scissor, were able to enter the retroperitoneal cavity.  The retroperitoneal potential space was opened further until palpating out the psoas muscle, the medial aspect of the iliac crest, the medial aspect of the last rib and continued to define the retroperitoneal space with blunt dissection in order to facilitate safe placement of our dilators.    While protecting by dissecting directly onto a finger in the retroperitoneum, the retroperitoneal space was entered safely from the lateral incision and the initial dilator placed onto the muscle belly of the psoas.  While directly stimulating the dilator and after radiographically confirming our location relative to the disc space, I placed the dilator through the psoas.  The dilators were  stimulated to ensure remaining safely away from any of the lumbar plexus nerves; the dilators were repositioned until no pathologic stimulation was  appreciated.  Once I had confirmed the location of our initial dilator radiographically, a K-wire secured the dilator into the L3/4 disc space and confirmed position under A/P and lateral fluoroscopy.  At this point, I dilated up with direct stimulation to confirm lack of pathologic stimulation.  Once all the dilators were in position, I placed in the retractor and secured it onto the table, locked into position and confirmed under A/P and lateral fluoroscopy to confirm our approach angle to the disc space as well as location relative to the disc space.  I then placed the muscle stimulator in through the working channel down to the vertebral body, stimulating the entire lateral surface of the vertebral body and any of the visualized psoas muscle that was adjacent to the retractor, confirming again the safe passage to the psoas before we began performing the discectomy.  At this point, we began our discectomy at L3/4.  The disc was incised laterally throughout the extent of our exposure. Using a combination of pituitary rongeurs, Kerrison rongeurs, rasps, curettes of various sorts, we were able to begin to clean out the disc space.  Once we had cleaned out the majority of the disc space, we then cut the lateral annulus with a cob, breaking the lateral annual attachments on the contralateral side by subtly working the cob through the annulus while using flouroscopy.  Care was taken not to extend further than required after cutting the annular attachments.  After this had been performed, we prepared the endplates for placement of our graft, sized a graft to the disc space by serially dilating up in trial sizes until we confirmed that our graft would be well positioned, allowing distraction while maintaining good grip.  This was confirmed under A/P and lateral fluoroscopy in order to ensure its placement as an eventual trial for placement of our final graft.  We irrigated with bacteriostatic saline.  Once confirmed  placement, the Modulus implant filled with allograft was impacted into position at L3/4.   Through a combination of intradiscal distraction and anterior releasing, we were able to correct the anterior deformity during disc preparation and placement of the graft.  After performing the lateral lumbar interbody procedure at L3/4, attention was moved to the L2/3 level.   While protecting by dissecting directly onto a finger in the retroperitoneum, the retroperitoneal space was entered safely from the lateral incision and the initial dilator placed onto the muscle belly of the psoas.  While directly stimulating the dilator and after radiographically confirming our location relative to the disc space, I placed the dilator through the psoas.  The dilators were stimulated to ensure remaining safely away from any of the lumbar plexus nerves; the dilators were repositioned until no pathologic stimulation was appreciated.  Once I had confirmed the location of our initial dilator radiographically, a K-wire secured the dilator into the L2/3 disc space and confirmed position under A/P and lateral fluoroscopy.  At this point, I dilated up with direct stimulation to confirm lack of pathologic stimulation.  Once all the dilators were in position, I placed in the retractor (while stimulating the posterior blade) and this was secured onto the table, locked into position and confirmed under A/P and lateral fluoroscopy to confirm our approach angle to the disc space as well as location relative to the disc space.  I then placed the muscle  stimulator in through the working channel down to the vertebral body, stimulating the entire lateral surface of the vertebral body and any of the visualized psoas muscle that was adjacent to the retractor, confirming again the safe passage to the psoas before we began performing the discectomy.  At this point, we began our discectomy at L2/3.  Through a combination of intradiscal distraction  and anterior releasing, we were able to correct the anterior deformity.  The disc was incised laterally throughout the extent of our exposure. Using a combination of pituitary rongeurs, Kerrison rongeurs, rasps, curettes of various sorts, we were able to begin to clean out the disc space.  Once we had cleaned out the majority of the disc space, we then cut the lateral annulus with a cob, breaking the lateral annual attachments on the contralateral side by subtly working the cob through the annulus while using flouroscopy.  Care was taken not to extend further than required after cutting the annular attachments.  After this had been performed, we prepared the endplates for placement of our graft, sized a graft to the disc space by serially dilating up in trial sizes until we confirmed that our graft would be well positioned, allowing distraction while maintaining good grip.  This was confirmed under A/P and lateral fluoroscopy in order to ensure its placement as an eventual trial for placement of our final graft.  We irrigated with bacteriostatic saline.  Once confirmed placement, the Modulus graft was impacted into position at L2/3.              At this point, final radiographs were performed, and we began closure.  The wound was closed using 0 Vicryl interrupted suture in the fascia and 2-0 Vicryl inverted suture were placed in the subcutaneous tissue and dermis. 3-0 monocryl was used for final closure. Dermabond was used to close the skin.    After closing the anterior part in layers, the patient was repositioned into prone position.  All pressure points were checked and double-checked and we brought in fluoroscopy to confirm our approach angles for putting in percutaneous pedicle screws.  The pedicles were marked using true AP flouroscopy, adjusting the angle at each level.  We then prepped and draped the patient in the standard fashion.  At this point, incisions were made for placing percutaneous  pedicle screw instrumentation at L2-4.  Starting at L4, a Jamsheedi needle was used to cannulate the pedicle bilaterally using AP flouroscopy. Direct stimulation was used on the needle without any low (<15 mAmp) stimulation thresholds. After cannulation of the pedicle to 30 mm, a K-wire was placed through the [place] approximately and secured.   Using a similar technique, the pedicles at L2-L4 were cannulated and K wires secured. The K wires were then checked using lateral flouroscopy to ensure placement into the vertebral bodies. After confirming placement of K wires, cannulated pedicle screws were introduced over the K wires at each level.  After advancing each screw into the vertebral body approximately 25-30 mm, the K wire was removed.At each level, 6.5x60mm Nuvasive Reline pedicle screws were placed under lateral flouroscopy. Once the screws were placed, the screw extensions were then linked, a path was formed for the rod and a rod was utilized to connect the screws.  We then compressed, torqued / counter-torqued and removed the screw assembly. Once performed on each side, confirmatory AP and lateral x-rays were taken and the case was completed.   The posterior elements visible from the [place] incision were  prepared for posterolateral arthrodesis.  Posterolateral arthrodesis was performed from L2-4.  Again we confirmed radiographically and began our closure.  The wound was closed using 0 Vicryl interrupted suture in the fascia, 2-0 Vicryl inverted suture were placed in the subcutaneous tissue and dermis. 3-0 monocryl was used for final closure. Dermabond was used to close the skin.    Needle, lap and all counts were correct at the end of the case.    Marin Olp PA assisted in the entire procedure.  Meade Maw MD Neurosurgery

## 2019-05-19 NOTE — H&P (Signed)
I have reviewed and confirmed my history and physical from 05/06/2019 with no additions or changes. Plan for L2-4 lateral lumbar interbody fusion and posterior fusion.  Risks and benefits reviewed.  Heart sounds normal no MRG. Chest Clear to Auscultation Bilaterally.

## 2019-05-20 LAB — GLUCOSE, CAPILLARY
Glucose-Capillary: 86 mg/dL (ref 70–99)
Glucose-Capillary: 107 mg/dL — ABNORMAL HIGH (ref 70–99)
Glucose-Capillary: 133 mg/dL — ABNORMAL HIGH (ref 70–99)
Glucose-Capillary: 78 mg/dL (ref 70–99)

## 2019-05-20 MED ORDER — DIAZEPAM 2 MG PO TABS: 2.0000 mg | ORAL_TABLET | Freq: Once | ORAL | Status: DC

## 2019-05-20 MED ORDER — POLYETHYLENE GLYCOL 3350 17 G PO PACK
17.0000 g | PACK | Freq: Every day | ORAL | Status: DC
  Administered 2019-05-20 – 2019-05-21 (×2): 17 g via ORAL
  Filled 2019-05-20 (×2): qty 1

## 2019-05-20 NOTE — Evaluation (Signed)
Occupational Therapy Evaluation Patient Details Name: Arbadella Kimbler MRN: [id number] DOB: 08-19-1941 Today's Date: 05/20/2019    History of Present Illness Pt is a 78 year old female admitted to [facility] for a L 2-4 lumbar fusion.  PMH includes htn, DM and pt stated L humeral fracture due to falling.   Clinical Impression   Pt. presents with pain, weakness, limited activity tolerance, spinal precautions, and limited functional mobility which hinders her ability to complete basic ADL and IADL functioning. Pt. resides at home alone. Pt. was independent with ADLs, and IADL functioning: including meal preparation, and medication management. Pt. was able to drive, grocery shop. Pt.'s children accompany her to medical appointments. Pt. Education was provided about A/E use for LE ADLs, Aspen brace application, and spinal precautions. Pt. Could benefit from OT services for ADL training, follow-up A/E training, and pt. Education about Doctor, general practice, spinal precautions, home modification, and DME. Pt. Plans to return home upon discharge with family to assist pt. as needed. Pt. Could benefit from follow-up [place] services upon discharge.    Follow Up Recommendations  Home health OT    Equipment Recommendations  Tub/shower seat    Recommendations for Other Services       Precautions / Restrictions Precautions Precautions: Back Precaution Booklet Issued: No Required Braces or Orthoses: Other Brace(Aspen Back Brace) Restrictions Weight Bearing Restrictions: No      Mobility Bed Mobility Overal bed mobility: Needs Assistance Bed Mobility: Supine to Sit     Supine to sit: Min guard     General bed mobility comments: To EOB for review of back brace application  Transfers   Equipment used: Rolling walker (2 wheeled) Transfers: Sit to/from Stand Sit to Stand: Min assist         General transfer comment: Per PT report    Balance                                            ADL either performed or assessed with clinical judgement   ADL Overall ADL's : Needs assistance/impaired Eating/Feeding: Independent;Set up   Grooming: Independent;Set up   Upper Body Bathing: Moderate assistance   Lower Body Bathing: Moderate assistance   Upper Body Dressing : Moderate assistance   Lower Body Dressing: Moderate assistance                 General ADL Comments: Pt. education was provided about A/E use for LE ADLs, Aspen back brace, log rolling, and spinal precautions     Vision Baseline Vision/History: Wears glasses Wears Glasses: At all times Patient Visual Report: (Recent rIght eye blurring, and eyelid droop.Being followed by her vision specialist)       Perception     Praxis      Pertinent Vitals/Pain Pain Assessment: 0-10 Pain Score: 5  Pain Descriptors / Indicators: Aching Pain Intervention(s): Repositioned;Limited activity within patient's tolerance;RN gave pain meds during session     Hand Dominance Right   Extremity/Trunk Assessment             Communication Communication Communication: No difficulties   Cognition                                           General Comments  Exercises     Shoulder Instructions      Home Living Family/patient expects to be discharged to:: Private residence Living Arrangements: Alone Available Help at Discharge: Family(Son) Type of Home: Apartment Home Access: Level entry(1st floor)   Entrance Stairs-Rails: None Home Layout: One level     Bathroom Shower/Tub: Tub/shower unit         Home Equipment: Cane - single point;Toilet riser;Tub bench;Bedside commode          Prior Functioning/Environment Level of Independence: Independent with assistive device(s)        Comments: Independent with ADLs, and IADLs, driving, shopping. Family attends MD appointments with the pt.        OT Problem List: Decreased strength;Impaired UE  functional use;Decreased knowledge of precautions;Pain;Decreased activity tolerance      OT Treatment/Interventions: Self-care/ADL training;Therapeutic activities;DME and/or AE instruction;Patient/family education;Therapeutic exercise;Energy conservation    OT Goals(Current goals can be found in the care plan section) Acute Rehab OT Goals Patient Stated Goal: To regain independence OT Goal Formulation: With patient Potential to Achieve Goals: Good  OT Frequency: Min 2X/week   Barriers to D/C:            Co-evaluation              AM-PAC OT "6 Clicks" Daily Activity     Outcome Measure Help from another person eating meals?: None Help from another person taking care of personal grooming?: None Help from another person toileting, which includes using toliet, bedpan, or urinal?: A Little Help from another person bathing (including washing, rinsing, drying)?: A Lot Help from another person to put on and taking off regular upper body clothing?: None Help from another person to put on and taking off regular lower body clothing?: A Lot 6 Click Score: 19   End of Session    Activity Tolerance: Patient tolerated treatment well Patient left: in bed;with call bell/phone within reach;with bed alarm set;with family/visitor present  OT Visit Diagnosis: Muscle weakness (generalized) (M62.81)                Time: 0932-3557 OT Time Calculation (min): 35 min Charges:  OT General Charges $OT Visit: 1 Visit OT Evaluation $OT Eval Moderate Complexity: 1 Mod OT Treatments $Self Care/Home Management : 8-22 mins  Harrel Carina, MS, OTR/L   Harrel Carina 05/20/2019, 5:14 PM

## 2019-05-20 NOTE — Evaluation (Signed)
Physical Therapy Evaluation Patient Details Name: Lori Harrell MRN: [id number] DOB: 11/11/1940 Today's Date: 05/20/2019   History of Present Illness  Pt is a 78 year old female admitted s/p L 2-4 lumbar fusion.  PMH includes htn, DM and pt stated L humeral fracture due to falling.  Clinical Impression  Pt is a 78 year old female who lives in a one story home alone.  She was able to walk for 30 min at a time without an AD at the beginning of this year but has experienced a gradual decline due to back and LE pain.  Pt up with nursing when PT entered room.  PT assisted pt with toilet transfer, min A due to low toilet.  Pt able to ambulate 50 ft in room with RW and no outstanding gait deviations.  Pt does maintain a flexed posture which puts her at risk for LOB. Pt open to all education and able to follow there ex without difficulty.  She presented with good LE strength, R stronger than L, and ROM deficits in L UE due to previous humeral fracture. Pt will continue to benefit from skilled PT with focus on strength, safe functional mobility, precautions and fall prevention.    Follow Up Recommendations Home health PT;Supervision - Intermittent    Equipment Recommendations  3in1 (PT)(Pt has some sort of riser on her toilet without armrests or handles to hold to.)    Recommendations for Other Services       Precautions / Restrictions Precautions Precautions: Back Precaution Booklet Issued: No Restrictions Weight Bearing Restrictions: No      Mobility  Bed Mobility Overal bed mobility: (Pt walking with nursing at initiation of session and sitting in chair at end.)                Transfers Overall transfer level: Needs assistance Equipment used: Rolling walker (2 wheeled) Transfers: Sit to/from Stand Sit to Stand: Min assist         General transfer comment: From low toilet, Pt able to use walker and PT assistance to stand.  Pt rushed into bathroom with Nurse tech and  did not have time to place toilet riser.  Ambulation/Gait Ambulation/Gait assistance: Supervision Gait Distance (Feet): 50 Feet Assistive device: Rolling walker (2 wheeled)     Gait velocity interpretation: 1.31 - 2.62 ft/sec, indicative of limited community ambulator General Gait Details: Good foot clearance and step length, flexed posture, good use of RW.  Able to navigate obstacles appropriately.  Stairs            Wheelchair Mobility    Modified Rankin (Stroke Patients Only)       Balance Overall balance assessment: Needs assistance   Sitting balance-Leahy Scale: Normal       Standing balance-Leahy Scale: Fair Standing balance comment: Pt requires a RW for balance at this time.  Normally uses a SPC.                             Pertinent Vitals/Pain Pain Assessment: No/denies pain    Home Living Family/patient expects to be discharged to:: Private residence Living Arrangements: Alone Available Help at Discharge: Friend(s)(Son) Type of Home: Apartment Home Access: Stairs to enter Entrance Stairs-Rails: None Entrance Stairs-Number of Steps: 1 Home Layout: One level Home Equipment: Cane - single point;Toilet riser;Tub bench      Prior Function Level of Independence: Independent with assistive device(s)  Comments: Pt was walking 30 min/day, 4 days a week until December.     Hand Dominance        Extremity/Trunk Assessment   Upper Extremity Assessment Upper Extremity Assessment: Generalized weakness;LUE deficits/detail LUE Deficits / Details: Hx of L humeral fracture; pt unable to flex/abduct shoulder past 70 degrees. LUE Sensation: WNL LUE Coordination: WNL    Lower Extremity Assessment Lower Extremity Assessment: Overall WFL for tasks assessed(Ankle DF/PF: 4/5 bilat, knee flexion/extension: L: 4-/5, R: 4/5 due to pt reported "pinched nerve".)    Cervical / Trunk Assessment Cervical / Trunk Assessment: Kyphotic   Communication   Communication: No difficulties  Cognition Arousal/Alertness: Awake/alert Behavior During Therapy: WFL for tasks assessed/performed Overall Cognitive Status: Within Functional Limits for tasks assessed                                 General Comments: Very pleasant and open to education.      General Comments      Exercises General Exercises - Lower Extremity Ankle Circles/Pumps: 20 reps;AROM;Both;Seated Gluteal Sets: Both;10 reps;Seated;Strengthening Heel Slides: Both;10 reps;Seated;Strengthening Hip ABduction/ADduction: Strengthening;Both;10 reps;Seated Other Exercises Other Exercises: Education regarding log roll technique x3 min   Assessment/Plan    PT Assessment Patient needs continued PT services  PT Problem List Decreased strength;Decreased mobility;Decreased activity tolerance;Decreased balance;Decreased knowledge of use of DME;Pain       PT Treatment Interventions DME instruction;Therapeutic activities;Gait training;Therapeutic exercise;Patient/family education;Stair training;Balance training;Functional mobility training    PT Goals (Current goals can be found in the Care Plan section)  Acute Rehab PT Goals Patient Stated Goal: to return home and to walk for exercise. PT Goal Formulation: With patient Time For Goal Achievement: 06/03/19 Potential to Achieve Goals: Good    Frequency 7X/week   Barriers to discharge        Co-evaluation               AM-PAC PT "6 Clicks" Mobility  Outcome Measure Help needed turning from your back to your side while in a flat bed without using bedrails?: A Little Help needed moving from lying on your back to sitting on the side of a flat bed without using bedrails?: A Little Help needed moving to and from a bed to a chair (including a wheelchair)?: A Little Help needed standing up from a chair using your arms (e.g., wheelchair or bedside chair)?: A Little Help needed to walk in hospital  room?: A Little Help needed climbing 3-5 steps with a railing? : A Little 6 Click Score: 18    End of Session Equipment Utilized During Treatment: Gait belt Activity Tolerance: Patient tolerated treatment well Patient left: in chair;with chair alarm set;with call bell/phone within reach;with SCD's reapplied   PT Visit Diagnosis: Unsteadiness on feet (R26.81);Muscle weakness (generalized) (M62.81);History of falling (Z91.81)    Time: 1000-1027 PT Time Calculation (min) (ACUTE ONLY): 27 min   Charges:   PT Evaluation $PT Eval Low Complexity: 1 Low PT Treatments $Therapeutic Activity: 8-22 mins        Roxanne Gates, PT, DPT   Roxanne Gates 05/20/2019, 11:16 AM

## 2019-05-20 NOTE — TOC Initial Note (Signed)
Transition of Care [facility]) - Initial/Assessment Note    Patient Details  Name: Lori Harrell MRN: [id number] Date of Birth: 1941/01/21  Transition of Care [facility]) CM/SW Contact:    Su Hilt, RN Phone Number: 05/20/2019, 12:33 PM  Clinical Narrative:                  Met with the patient to determine needs. The patient lives at home  She has family to help her at home Has a RW, BSC, and cane at home She is getting a back brace She was set up with [facility] by the physicians office No further needs Expected Discharge Plan: [place] Barriers to Discharge: Barriers Resolved   Patient Goals and CMS Choice Patient states their goals for this hospitalization and ongoing recovery are:: go home      Expected Discharge Plan and Services Expected Discharge Plan: [place]   Discharge Planning Services: CM Consult   Living arrangements for the past 2 months: Single Family Home Expected Discharge Date: 05/20/19               DME Arranged: N/A         HH Arranged: PT HH Agency: [facility] Date [place]: 05/20/19 Time [place]: 1232 Representative spoke with at [place]: cassie  Prior Living Arrangements/Services Living arrangements for the past 2 months: [place] with:: Spouse Patient language and need for interpreter reviewed:: No Do you feel safe going back to the place where you live?: Yes      Need for Family Participation in Patient Care: No (Comment) Care giver support system in place?: Yes (comment) Current home services: DME(RW, BSC, Cane) Criminal Activity/Legal Involvement Pertinent to Current Situation/Hospitalization: No - Comment as needed  Activities of Daily Living Home Assistive Devices/Equipment: Cane (specify quad or straight), Walker (specify type) ADL Screening (condition at time of admission) Patient's cognitive ability adequate to safely complete  daily activities?: Yes Is the patient deaf or have difficulty hearing?: No Does the patient have difficulty seeing, even when wearing glasses/contacts?: No Does the patient have difficulty concentrating, remembering, or making decisions?: No Patient able to express need for assistance with ADLs?: Yes Does the patient have difficulty dressing or bathing?: Yes Independently performs ADLs?: No Communication: Independent Dressing (OT): Needs assistance Is this a change from baseline?: Change from baseline, expected to last >3 days Grooming: Independent Feeding: Independent Bathing: Needs assistance Is this a change from baseline?: Change from baseline, expected to last >3 days Toileting: Needs assistance Is this a change from baseline?: Change from baseline, expected to last <3 days In/Out Bed: Needs assistance Is this a change from baseline?: Change from baseline, expected to last <3 days Walks in Home: Needs assistance Is this a change from baseline?: Change from baseline, expected to last <3 days Does the patient have difficulty walking or climbing stairs?: Yes Weakness of Legs: Right Weakness of Arms/Hands: Left  Permission Sought/Granted   Permission granted to share information with : Yes, Verbal Permission Granted              Emotional Assessment Appearance:: Appears stated age Attitude/Demeanor/Rapport: Engaged Affect (typically observed): Appropriate Orientation: : Oriented to Self, Oriented to Place, Oriented to  Time, Oriented to Situation Alcohol / Substance Use: Not Applicable Psych Involvement: No (comment)  Admission diagnosis:  lumbar radiculopathy m54.16, 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  S/P lumbar fusion 05/19/2019  . 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 Unstageable pressure ulcer of back ([place]) 01/07/2019  . DDD (degenerative disc disease), lumbar 01/07/2019  . Carotid artery stenosis, asymptomatic,  bilateral 07/07/2017  . Primary open angle glaucoma 05/08/2017  . 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 Large hiatal hernia 07/04/2016  . Sigmoid diverticulitis 07/04/2016  . Stress incontinence 07/04/2016  . Type II diabetes mellitus with manifestations ([place]) 10/31/2015  . Constipation 10/31/2015  . 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 Essential (primary) hypertension 06/16/2015  . Hyperlipidemia associated with type 2 diabetes mellitus ([place]) 06/16/2015  . OP (osteoporosis) 06/16/2015  . Hypercalcemia 04/22/2012   PCP:  Glean Hess, MD Pharmacy:   [facility] [address] [place] [address] Phone: [phone] Fax: [phone]  [facility] [place], [place] [place] [address] Phone: [phone] Fax: [phone]  [id number] [address], [place] [address] Phone: [phone] Fax: [phone]  [facility] [place], [place] [address] [place] [address] Phone: [phone] Fax: [phone]     Social Determinants of Health (SDOH) Interventions    Readmission Risk Interventions No flowsheet data found.

## 2019-05-21 ENCOUNTER — Inpatient Hospital Stay: Payer: Medicare Other

## 2019-05-21 ENCOUNTER — Encounter: Payer: Self-pay | Admitting: Neurosurgery

## 2019-05-21 LAB — GLUCOSE, CAPILLARY
Glucose-Capillary: 99 mg/dL (ref 70–99)
Glucose-Capillary: 128 mg/dL — ABNORMAL HIGH (ref 70–99)

## 2019-05-21 LAB — TYPE AND SCREEN
ABO/RH(D): A POS
Antibody Screen: POSITIVE
PT AG Type: POSITIVE
Unit division: 0
Unit division: 0

## 2019-05-21 LAB — BPAM RBC
Blood Product Expiration Date: 202009072359
Blood Product Expiration Date: 202009142359
Unit Type and Rh: 5100
Unit Type and Rh: 5100

## 2019-05-21 MED ORDER — CELECOXIB 100 MG PO CAPS: 100.0000 mg | ORAL_CAPSULE | Freq: Two times a day (BID) | ORAL | 0 refills | Status: DC

## 2019-05-21 MED ORDER — OXYCODONE HCL 5 MG PO TABS: 5.0000 mg | ORAL_TABLET | ORAL | 0 refills | Status: DC | PRN

## 2019-05-21 MED ORDER — OXYCODONE HCL 5 MG PO TABS: 5.0000 mg | ORAL_TABLET | ORAL | 0 refills | Status: AC | PRN

## 2019-05-21 MED ORDER — METHOCARBAMOL 500 MG PO TABS: 750.0000 mg | ORAL_TABLET | Freq: Four times a day (QID) | ORAL | 0 refills | Status: DC | PRN

## 2019-05-21 NOTE — Progress Notes (Signed)
Patient being discharged from unit at this time. Family to pick up. All discharge instructions reviewed with patient. Back brace on. Patient displays no s/sx of distress, voices no c/o

## 2019-05-21 NOTE — TOC Transition Note (Signed)
Transition of Care [facility]) - CM/SW Discharge Note   Patient Details  Name: Lori Harrell MRN: [id number] Date of Birth: Nov 15, 1940  Transition of Care [facility]) CM/SW Contact:  Su Hilt, RN Phone Number: 05/21/2019, 9:00 AM   Clinical Narrative:    Patient to discharge home with [facility] from [facility], has DME at home and has no additional needs   Final next level of care: [place] [facility]) Barriers to Discharge: Barriers Resolved   Patient Goals and CMS Choice Patient states their goals for this hospitalization and ongoing recovery are:: go home      Discharge Placement                       Discharge Plan and Services   Discharge Planning Services: CM Consult            DME Arranged: N/A         HH Arranged: PT HH Agency: [facility] Date [facility] Agency Contacted: 05/20/19 Time [place]: 1232 Representative spoke with at [place]: cassie  Social Determinants of Health ([place]) Interventions     Readmission Risk Interventions No flowsheet data found.

## 2019-05-21 NOTE — Discharge Instructions (Signed)
Your surgeon has performed an operation on your lumbar spine (low back) to fuse two or more of the vertebrae (bones) together. This procedure is performed to treat a number of different spinal problems, including narrowing of the spinal canal (stenosis), herniated discs, degenerative changes, and injuries.  ° °Many times, patients feel better immediately after surgery and can "overdo it." Even if you feel well, it is important that you follow these activity guidelines. If you do not let your back heal properly from the surgery, you can increase the chance of return of your symptoms and other complications. The following are instructions to help in your recovery once you have been discharged from the hospital.  ° °* Do not take anti-inflammatory medications for 3 months after surgery (naproxen [Aleve], ibuprofen [Advil, Motrin], celecoxib [Celebrex], etc.). These medications can prevent your bones from healing properly.  ° °Activity  °   °No bending, lifting, or twisting ("BLT"). Avoid lifting objects heavier than 10 pounds (gallon milk jug).  Where possible, avoid household activities that involve lifting, bending, reaching, pushing, or pulling such as laundry, vacuuming, grocery shopping, and childcare. Try to arrange for help from friends and family for these activities while your back heals.  ° °Increase physical activity slowly as tolerated.  Taking short walks is encouraged, but avoid strenuous exercise. Do not jog, run, bicycle, lift weights, or participate in any other exercises unless specifically allowed by your doctor. Avoid prolonged sitting, including car rides.  ° °Talk to your doctor before resuming sexual activity.  ° °You should not drive until cleared by your doctor.  ° °Until released by your doctor, you should not return to work or school.  You should rest at home and let your body heal.  ° °You may shower three days after your surgery.  After showering, lightly dab your incision dry. Do not take  a tub bath or go swimming until approved by your doctor at your follow-up appointment.  ° °If your doctor ordered a lumbar brace for you, you should wear it whenever you are out of bed. You may remove it when lying down or sleeping. You should also wear it when riding in a car. Not all back surgeries require a lumbar brace.  ° °If you smoke, we strongly recommend that you quit.  Smoking has been proven to interfere with normal bone healing and will dramatically reduce the success rate of your surgery. Please contact [facility] (800-QUIT-NOW) and use the resources at www.[facility].com for assistance in stopping smoking.  ° °Surgical Incision  ° °If you have a dressing on your incision, you may remove it two days after your surgery. Keep your incision area clean and dry.  ° °If you have staples or stitches on your incision, you should have a follow up scheduled for removal. If you do not have staples or stitches, you will have steri-strips (small pieces of surgical tape) or Dermabond glue. The steri-strips/glue should begin to peel away within about a week (it is fine if the steri-strips fall off before then). If the strips are still in place one week after your surgery, you may gently remove them.  ° °Diet          ° ° You may return to your usual diet. Be sure to stay hydrated.  ° °When to Contact Us  ° °Although your surgery and recovery will likely be uneventful, you may have some residual numbness, aches, and pains in your back and/or legs. This is normal and should   improve in the next few weeks.  ° °However, should you experience any of the following, contact us immediately:  ° - New numbness or weakness  ° - Pain that is progressively getting worse, and is not relieved by your pain medications or rest  ° - Bleeding, redness, swelling, pain, or drainage from surgical incision  ° - Chills or flu-like symptoms  ° - Fever greater than 101.0 F (38.3 C)  ° - Problems with bowel or bladder functions  ° - Difficulty  breathing or shortness of breath  ° - Warmth, tenderness, or swelling in your calf  °Contact Information  ° - During office hours (Monday-Friday 9 am to 5 pm), please call your physician at [phone] ([place])  ° - After hours and weekends, please call the [facility] Operator at [phone] and ask for the Neurosurgery Resident On Call  ° - For a life-threatening emergency, call 911  °

## 2019-05-21 NOTE — Progress Notes (Signed)
Occupational Therapy Treatment Patient Details Name: Lori Harrell MRN: [id number] DOB: [birth year] Today's Date: 05/21/2019    History of present illness Pt is a 78 year old female admitted to [facility] for a L 2-4 lumbar fusion.  PMH includes htn, DM and pt stated L humeral fracture due to falling.   OT comments  Lori Harrell was seen for OT tx on this date. Upon arrival to pt room, pt ambulating from room bathroom back to chair. Pt able to return verbalize 3/3 back precautions "BLT's", but was observed to have difficulty adhering to them during functional activity. Pt educated on importance of maintaining a straight back while sitting down, as well as using her Aspen Brace during functional mobility. Pt endorsed concerns about how to don her brace. OT provided min assist to don brace with VC's for pt technique throughout. Pt verbalized understanding of instruction provided and return demonstrated ability to sit using straight back and arms to lower herself to the chair. Pt making good progress toward goals. Pt continues to benefit from skilled OT services to maximize return to PLOF and minimize risk of future falls, injury, caregiver burden, and readmission. Will continue to follow POC. Discharge recommendation remains appropriate.    Follow Up Recommendations  Home health OT    Equipment Recommendations  Tub/shower seat    Recommendations for Other Services      Precautions / Restrictions Precautions Precautions: Back Precaution Booklet Issued: No Required Braces or Orthoses: Other Brace(Aspen Brace) Restrictions Weight Bearing Restrictions: No       Mobility Bed Mobility Overal bed mobility: Needs Assistance             General bed mobility comments: deferred up in recliner  Transfers Overall transfer level: Needs assistance Equipment used: Rolling walker (2 wheeled) Transfers: Sit to/from Stand Sit to Stand: Supervision         General transfer comment: cues  for RW use    Balance Overall balance assessment: Needs assistance Sitting-balance support: Feet unsupported;No upper extremity supported Sitting balance-Leahy Scale: Normal     Standing balance support: No upper extremity supported Standing balance-Leahy Scale: Fair Standing balance comment: Able to maintain balance without walker                           ADL either performed or assessed with clinical judgement   ADL Overall ADL's : Needs assistance/impaired                 Upper Body Dressing : Minimal assistance;Sitting;Set up   Lower Body Dressing: Minimal assistance;Sit to/from stand;Set up   Toilet Transfer: RW;Supervision/safety   Toileting- Clothing Manipulation and Hygiene: Sit to/from stand;Cueing for back precautions;Supervision/safety       Functional mobility during ADLs: Rolling walker;Supervision/safety       Vision Baseline Vision/History: Wears glasses Wears Glasses: At all times Patient Visual Report: No change from baseline     Perception     Praxis      Cognition Arousal/Alertness: Awake/alert Behavior During Therapy: WFL for tasks assessed/performed Overall Cognitive Status: Within Functional Limits for tasks assessed                                          Exercises Other Exercises Other Exercises: Pt educated in use of aspen brace, as well as wear schedule (wear during functional mobility, put  on while seated). Pt given min A to don brace. Other Exercises: Pt educated falls prevention strategies as well as home safety equipment options including options for non-permanent grab-bars for her home bathroom.   Shoulder Instructions       General Comments      Pertinent Vitals/ Pain       Pain Assessment: Faces Pain Score: 1  Faces Pain Scale: Hurts a little bit Pain Location: Back Pain Descriptors / Indicators: Aching Pain Intervention(s): Limited activity within patient's  tolerance;Repositioned;Monitored during session  Home Living                                          Prior Functioning/Environment              Frequency           Progress Toward Goals  OT Goals(current goals can now be found in the care plan section)  Progress towards OT goals: Progressing toward goals  Acute Rehab OT Goals Patient Stated Goal: To regain independence OT Goal Formulation: With patient Potential to Achieve Goals: Good  Plan Discharge plan remains appropriate;Frequency remains appropriate    Co-evaluation                 AM-PAC OT "6 Clicks" Daily Activity     Outcome Measure   Help from another person eating meals?: None Help from another person taking care of personal grooming?: None Help from another person toileting, which includes using toliet, bedpan, or urinal?: A Little Help from another person bathing (including washing, rinsing, drying)?: A Lot Help from another person to put on and taking off regular upper body clothing?: None Help from another person to put on and taking off regular lower body clothing?: A Lot 6 Click Score: 19    End of Session Equipment Utilized During Treatment: Gait belt;Rolling walker  OT Visit Diagnosis: Muscle weakness (generalized) (M62.81)   Activity Tolerance Patient tolerated treatment well   Patient Left with call bell/phone within reach;with family/visitor present;in chair(With hospital transport present to take pt for DC)   Nurse Communication          Time: [id number] OT Time Calculation (min): 18 min  Charges: OT General Charges $OT Visit: 1 Visit OT Treatments $Self Care/Home Management : 8-22 mins  Shara Blazing, M.S., OTR/L Ascom: [phone] 05/21/19, 2:51 PM

## 2019-05-21 NOTE — Discharge Summary (Addendum)
Procedure: L2-4 Lateral Interbody fusion Procedure date: 05/19/2019 Diagnosis: Lumbar radiculopathy  History: Lori Harrell is s/p L2-4 lateral interbody fusion  POD2: Continues to do well. Overall pain 1/10 - mostly still in left leg. Wasn't able to sleep well last night because of discomfort she relates to hospital bed. Voiding and had small BM. Ambulating to bathroom without issue. Brace help with back discomfort. Awaiting PT.  Update:  PT was completed and she ambulated without issue.   POD1: Recovering well. Right lower extremity pain has resolved.  Minimal back pain. Complaining of new left lower extremity pain. Denies any numbness/tingling. Ambulating to commode. Catheter discontinued but patient has not been able to void. No BM.  POD0: Tolerated procedure well. Evaluated in post op recovery still disoriented from anesthesia.   Physical Exam: Vitals:   05/20/19 2309 05/21/19 0735  BP: 124/67 131/66  Pulse: 92 91  Resp: 18 16  Temp: 98.2 F (36.8 C) 97.7 F (36.5 C)  SpO2: 96% 99%    AA Ox3 Strength:5/5 throughout. Pain limited weakness in left IS and quad Sensation: Intact throughout lower extremities Skin: Dressings clean and dry at incision sites.   Data:  No results for input(s): NA, K, CL, CO2, BUN, CREATININE, LABGLOM, GLUCOSE, CALCIUM in the last 168 hours. No results for input(s): AST, ALT, ALKPHOS in the last 168 hours.  Invalid input(s): TBILI   No results for input(s): WBC, HGB, HCT, PLT in the last 168 hours. No results for input(s): APTT, INR in the last 168 hours.       Other tests/results:  EXAM: LUMBAR SPINE - 2-3 VIEW 05/21/2019  COMPARISON:  None.  FINDINGS: Changes of posterior fusion. No hardware or bony complicating feature. 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 Rightward scoliosis.  IMPRESSION: Status post lumbar fusion.  No complicating feature.  Assessment/Plan:  Lori Harrell is  POD2 s/p L2-4 Lateral interbody fusion. She is recovering well. Pain adequately controlled, ambulating short distances without issue, eating and voiding without issue. Small BM last night.  She is scheduled to received home PT through [facility] on discharge. Will continue post op pain control with celebrex, robaxin, oxycodone, and tylenol as needed. Discussed activity restrictions and wound care. She is scheduled to follow up in 2 weeks to monitor progress. Advised to contact office if any questions or concerns arise before then.   Marin Olp PA-C Department of Neurosurgery

## 2019-05-21 NOTE — Progress Notes (Signed)
Physical Therapy Treatment Patient Details Name: Lori Harrell MRN: [id number] DOB: November 07, 1940 Today's Date: 05/21/2019    History of Present Illness Pt is a 78 year old female admitted to [facility] for a L 2-4 lumbar fusion.  PMH includes htn, DM and pt stated L humeral fracture due to falling.    PT Comments    Patient alert, up in chair and agreeable to PT prior to discharge, 1/10 pain reported. PT assisted pt with readjusting aspen brace. Sit <> stand with RW and supervision, verbal cues for hand placement. Pt ambulated ~162ft with RW and supervision/CGA initially, no LOB noted, decreased gait velocity and PT provided education about RW use with fair carryover. Pt able to recall 1/3 precautions independently, educated about the "BLTs". Patient returned to chair and in good spirits at end of session. The patient would benefit from further skilled PT to continue to progress as able and maximize mobility, independence, and safety.      Follow Up Recommendations  Home health PT;Supervision - Intermittent     Equipment Recommendations  3in1 (PT)    Recommendations for Other Services       Precautions / Restrictions Precautions Precautions: Back Precaution Booklet Issued: No Required Braces or Orthoses: Other Brace(Aspen back brace) Restrictions Weight Bearing Restrictions: No    Mobility  Bed Mobility               General bed mobility comments: deferred up in recliner, brace donned  Transfers Overall transfer level: Needs assistance Equipment used: Rolling walker (2 wheeled) Transfers: Sit to/from Stand Sit to Stand: Supervision         General transfer comment: cues for RW use  Ambulation/Gait Ambulation/Gait assistance: Supervision Gait Distance (Feet): 160 Feet Assistive device: Rolling walker (2 wheeled)       General Gait Details: Cues to keep BOS closer to walker with fair carryover, no LOB noted, no complaints of significant increase in  pain   Stairs             Wheelchair Mobility    Modified Rankin (Stroke Patients Only)       Balance Overall balance assessment: Needs assistance   Sitting balance-Leahy Scale: Normal     Standing balance support: No upper extremity supported Standing balance-Leahy Scale: Fair Standing balance comment: Able to maintain balance without walker                            Cognition Arousal/Alertness: Awake/alert Behavior During Therapy: WFL for tasks assessed/performed Overall Cognitive Status: Within Functional Limits for tasks assessed                                        Exercises      General Comments        Pertinent Vitals/Pain Pain Assessment: 0-10 Pain Score: 1  Pain Descriptors / Indicators: Aching Pain Intervention(s): Monitored during session;Repositioned    Home Living                      Prior Function            PT Goals (current goals can now be found in the care plan section) Progress towards PT goals: Progressing toward goals    Frequency    7X/week      PT Plan Current plan remains appropriate  Co-evaluation              AM-PAC PT "6 Clicks" Mobility   Outcome Measure  Help needed turning from your back to your side while in a flat bed without using bedrails?: A Little Help needed moving from lying on your back to sitting on the side of a flat bed without using bedrails?: A Little Help needed moving to and from a bed to a chair (including a wheelchair)?: A Little Help needed standing up from a chair using your arms (e.g., wheelchair or bedside chair)?: None Help needed to walk in hospital room?: A Little Help needed climbing 3-5 steps with a railing? : A Little 6 Click Score: 19    End of Session Equipment Utilized During Treatment: Gait belt Activity Tolerance: Patient tolerated treatment well Patient left: in chair;with chair alarm set;with call bell/phone within  reach Nurse Communication: Mobility status PT Visit Diagnosis: Unsteadiness on feet (R26.81);Muscle weakness (generalized) (M62.81);History of falling (Z91.81)     Time: [id number] PT Time Calculation (min) (ACUTE ONLY): 15 min  Charges:  $Therapeutic Exercise: 8-22 mins                    Lieutenant Diego PT, DPT 11:46 AM,05/21/19 [phone]

## 2019-05-21 NOTE — Progress Notes (Addendum)
Procedure: L2-4 Lateral Interbody fusion Procedure date: 05/19/2019 Diagnosis: Lumbar radiculopathy  History: Lori Harrell is s/p L2-4 lateral interbody fusion  POD2: Continues to do well. Overall pain 1/10 - Left leg.  Wasn't able to sleep well last night because of discomfort she relates to hospital bed. Voiding and had small BM. Ambulating to bathroom without issue. Brace help with back discomfort. Awaiting PT.  POD1: Recovering well. Right lower extremity pain has resolved.  Minimal back pain. Complaining of new left lower extremity pain. Denies any numbness/tingling. Ambulating to commode. Catheter discontinued but patient has not been able to void. No BM.  POD0: Tolerated procedure well. Evaluated in post op recovery still disoriented from anesthesia.   Physical Exam: Vitals:   05/20/19 2309 05/21/19 0735  BP: 124/67 131/66  Pulse: 92 91  Resp: 18 16  Temp: 98.2 F (36.8 C) 97.7 F (36.5 C)  SpO2: 96% 99%    AA Ox3 Strength:5/5 throughout. Pain limited weakness in left IS and quad Sensation: Intact throughout lower extremities Skin: Dressings clean and dry at incision sites.   Data:  No results for input(s): NA, K, CL, CO2, BUN, CREATININE, LABGLOM, GLUCOSE, CALCIUM in the last 168 hours. No results for input(s): AST, ALT, ALKPHOS in the last 168 hours.  Invalid input(s): TBILI   No results for input(s): WBC, HGB, HCT, PLT in the last 168 hours. No results for input(s): APTT, INR in the last 168 hours.       Other tests/results:  EXAM: LUMBAR SPINE - 2-3 VIEW 05/21/2019  COMPARISON:  None.  FINDINGS: Changes of posterior fusion. No hardware or bony complicating feature. 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 Rightward scoliosis.  IMPRESSION: Status post lumbar fusion.  No complicating feature.  Assessment/Plan:  Lori Harrell is POD2 s/p L2-4 Lateral interbody fusion. She is recovering well. Pain adequately  controlled, ambulating short distances without issue, eating and voiding without issue. Small BM last night. Awaiting PT evaluation.    Marin Olp PA-C Department of Neurosurgery

## 2019-05-22 DIAGNOSIS — I6523 Occlusion and stenosis of bilateral carotid arteries: Secondary | ICD-10-CM | POA: Diagnosis not present

## 2019-05-22 DIAGNOSIS — E1169 Type 2 diabetes mellitus with other specified complication: Secondary | ICD-10-CM | POA: Diagnosis not present

## 2019-05-22 DIAGNOSIS — M810 Age-related osteoporosis without current pathological fracture: Secondary | ICD-10-CM | POA: Diagnosis not present

## 2019-05-22 DIAGNOSIS — Z4789 Encounter for other orthopedic aftercare: Secondary | ICD-10-CM | POA: Diagnosis not present

## 2019-05-22 DIAGNOSIS — K219 Gastro-esophageal reflux disease without esophagitis: Secondary | ICD-10-CM | POA: Diagnosis not present

## 2019-05-22 DIAGNOSIS — H401111 Primary open-angle glaucoma, right eye, mild stage: Secondary | ICD-10-CM | POA: Diagnosis not present

## 2019-05-22 DIAGNOSIS — Z7984 Long term (current) use of oral hypoglycemic drugs: Secondary | ICD-10-CM | POA: Diagnosis not present

## 2019-05-22 DIAGNOSIS — M545 Low back pain: Secondary | ICD-10-CM | POA: Diagnosis not present

## 2019-05-22 DIAGNOSIS — K2270 Barrett's esophagus without dysplasia: Secondary | ICD-10-CM | POA: Diagnosis not present

## 2019-05-22 DIAGNOSIS — D649 Anemia, unspecified: Secondary | ICD-10-CM | POA: Diagnosis not present

## 2019-05-22 DIAGNOSIS — R2689 Other abnormalities of gait and mobility: Secondary | ICD-10-CM | POA: Diagnosis not present

## 2019-05-22 DIAGNOSIS — M6281 Muscle weakness (generalized): Secondary | ICD-10-CM | POA: Diagnosis not present

## 2019-05-22 DIAGNOSIS — M8589 Other specified disorders of bone density and structure, multiple sites: Secondary | ICD-10-CM | POA: Diagnosis not present

## 2019-05-24 DIAGNOSIS — D649 Anemia, unspecified: Secondary | ICD-10-CM | POA: Diagnosis not present

## 2019-05-24 DIAGNOSIS — K219 Gastro-esophageal reflux disease without esophagitis: Secondary | ICD-10-CM | POA: Diagnosis not present

## 2019-05-24 DIAGNOSIS — Z4789 Encounter for other orthopedic aftercare: Secondary | ICD-10-CM | POA: Diagnosis not present

## 2019-05-24 DIAGNOSIS — E1169 Type 2 diabetes mellitus with other specified complication: Secondary | ICD-10-CM | POA: Diagnosis not present

## 2019-05-24 DIAGNOSIS — M545 Low back pain: Secondary | ICD-10-CM | POA: Diagnosis not present

## 2019-05-24 DIAGNOSIS — K2270 Barrett's esophagus without dysplasia: Secondary | ICD-10-CM | POA: Diagnosis not present

## 2019-05-26 DIAGNOSIS — E1169 Type 2 diabetes mellitus with other specified complication: Secondary | ICD-10-CM | POA: Diagnosis not present

## 2019-05-26 DIAGNOSIS — Z4789 Encounter for other orthopedic aftercare: Secondary | ICD-10-CM | POA: Diagnosis not present

## 2019-05-26 DIAGNOSIS — K2270 Barrett's esophagus without dysplasia: Secondary | ICD-10-CM | POA: Diagnosis not present

## 2019-05-26 DIAGNOSIS — D649 Anemia, unspecified: Secondary | ICD-10-CM | POA: Diagnosis not present

## 2019-05-26 DIAGNOSIS — M545 Low back pain: Secondary | ICD-10-CM | POA: Diagnosis not present

## 2019-05-26 DIAGNOSIS — K219 Gastro-esophageal reflux disease without esophagitis: Secondary | ICD-10-CM | POA: Diagnosis not present

## 2019-05-28 DIAGNOSIS — D649 Anemia, unspecified: Secondary | ICD-10-CM | POA: Diagnosis not present

## 2019-05-28 DIAGNOSIS — E1169 Type 2 diabetes mellitus with other specified complication: Secondary | ICD-10-CM | POA: Diagnosis not present

## 2019-05-28 DIAGNOSIS — K2270 Barrett's esophagus without dysplasia: Secondary | ICD-10-CM | POA: Diagnosis not present

## 2019-05-28 DIAGNOSIS — M545 Low back pain: Secondary | ICD-10-CM | POA: Diagnosis not present

## 2019-05-28 DIAGNOSIS — K219 Gastro-esophageal reflux disease without esophagitis: Secondary | ICD-10-CM | POA: Diagnosis not present

## 2019-05-28 DIAGNOSIS — Z4789 Encounter for other orthopedic aftercare: Secondary | ICD-10-CM | POA: Diagnosis not present

## 2019-05-31 DIAGNOSIS — D649 Anemia, unspecified: Secondary | ICD-10-CM | POA: Diagnosis not present

## 2019-05-31 DIAGNOSIS — K219 Gastro-esophageal reflux disease without esophagitis: Secondary | ICD-10-CM | POA: Diagnosis not present

## 2019-05-31 DIAGNOSIS — E1169 Type 2 diabetes mellitus with other specified complication: Secondary | ICD-10-CM | POA: Diagnosis not present

## 2019-05-31 DIAGNOSIS — K2270 Barrett's esophagus without dysplasia: Secondary | ICD-10-CM | POA: Diagnosis not present

## 2019-05-31 DIAGNOSIS — M545 Low back pain: Secondary | ICD-10-CM | POA: Diagnosis not present

## 2019-05-31 DIAGNOSIS — Z4789 Encounter for other orthopedic aftercare: Secondary | ICD-10-CM | POA: Diagnosis not present

## 2019-06-03 DIAGNOSIS — D649 Anemia, unspecified: Secondary | ICD-10-CM | POA: Diagnosis not present

## 2019-06-03 DIAGNOSIS — K219 Gastro-esophageal reflux disease without esophagitis: Secondary | ICD-10-CM | POA: Diagnosis not present

## 2019-06-03 DIAGNOSIS — Z4789 Encounter for other orthopedic aftercare: Secondary | ICD-10-CM | POA: Diagnosis not present

## 2019-06-03 DIAGNOSIS — M545 Low back pain: Secondary | ICD-10-CM | POA: Diagnosis not present

## 2019-06-03 DIAGNOSIS — E1169 Type 2 diabetes mellitus with other specified complication: Secondary | ICD-10-CM | POA: Diagnosis not present

## 2019-06-03 DIAGNOSIS — K2270 Barrett's esophagus without dysplasia: Secondary | ICD-10-CM | POA: Diagnosis not present

## 2019-06-17 DIAGNOSIS — M5416 Radiculopathy, lumbar region: Secondary | ICD-10-CM | POA: Diagnosis not present

## 2019-06-17 DIAGNOSIS — M47816 Spondylosis without myelopathy or radiculopathy, lumbar region: Secondary | ICD-10-CM | POA: Diagnosis not present

## 2019-07-08 DIAGNOSIS — Z01812 Encounter for preprocedural laboratory examination: Secondary | ICD-10-CM | POA: Diagnosis not present

## 2019-07-08 DIAGNOSIS — K2270 Barrett's esophagus without dysplasia: Secondary | ICD-10-CM | POA: Diagnosis not present

## 2019-07-08 DIAGNOSIS — K449 Diaphragmatic hernia without obstruction or gangrene: Secondary | ICD-10-CM | POA: Diagnosis not present

## 2019-07-08 DIAGNOSIS — K2100 Gastro-esophageal reflux disease with esophagitis, without bleeding: Secondary | ICD-10-CM | POA: Diagnosis not present

## 2019-07-14 ENCOUNTER — Ambulatory Visit: Payer: Self-pay

## 2019-07-14 ENCOUNTER — Encounter: Payer: Self-pay | Admitting: Internal Medicine

## 2019-07-14 ENCOUNTER — Ambulatory Visit (INDEPENDENT_AMBULATORY_CARE_PROVIDER_SITE_OTHER): Payer: Medicare Other

## 2019-07-14 VITALS — Ht 64.0 in | Wt 113.0 lb

## 2019-07-14 DIAGNOSIS — Z0000 Encounter for general adult medical examination without abnormal findings: Secondary | ICD-10-CM

## 2019-07-14 NOTE — Progress Notes (Signed)
Subjective:   Lori Harrell is a 78 y.o. female who presents for Medicare Annual (Subsequent) preventive examination.  Virtual Visit via Telephone Note  I connected with Lori Harrell on 07/14/19 at  8:40 AM EDT by telephone and verified that I am speaking with the correct person using two identifiers.  Medicare Annual Wellness visit completed telephonically due to Covid-19 pandemic.   Location: Patient: home Provider: office   I discussed the limitations, risks, security and privacy concerns of performing an evaluation and management service by telephone and the availability of in person appointments. The patient expressed understanding and agreed to proceed.  Some vital signs may be absent or patient reported.   Clemetine Marker, LPN    Review of Systems:   Cardiac Risk Factors include: advanced age (>68men, >19 women);diabetes mellitus;dyslipidemia     Objective:     Vitals: Ht 5\' 4"  (1.626 m)   Wt 113 lb (51.3 kg)   BMI 19.40 kg/m   Body mass index is 19.4 kg/m.  Advanced Directives 07/14/2019 05/19/2019 05/10/2019 02/01/2019 02/01/2019 02/01/2019 07/08/2018  Does Patient Have a Medical Advance Directive? Yes Yes Yes Yes No No Yes  Type of Paramedic of [place];Living will [place];Living will [place];Living will [place];Living will - - [place];Living will  Does patient want to make changes to medical advance directive? - No - Patient declined - No - Patient declined - - -  Copy of [place] in Chart? Yes - validated most recent copy scanned in chart (See row information) No - copy requested - No - copy requested - - Yes  Would patient like information on creating a medical advance directive? - - - - - - -    Tobacco Social History   Tobacco Use  Smoking Status Never Smoker  Smokeless Tobacco Never Used  Tobacco Comment   smoking  cessation materials not required     Counseling given: Not Answered Comment: smoking cessation materials not required   Clinical Intake:  Pre-visit preparation completed: Yes  Pain : 0-10 Pain Score: 3  Pain Type: Chronic pain Pain Location: Leg(back) Pain Orientation: Right, Left Pain Descriptors / Indicators: Aching, Discomfort, Sore Pain Onset: More than a month ago Pain Frequency: Constant     BMI - recorded: 19.4 Nutritional Status: BMI of 19-24  Normal Nutritional Risks: None Diabetes: Yes CBG done?: No Did pt. bring in CBG monitor from home?: No   Nutrition Risk Assessment:  Has the patient had any N/V/D within the last 2 months?  No  Does the patient have any non-healing wounds?  No  Has the patient had any unintentional weight loss or weight gain?  No   Diabetes:  Is the patient diabetic?  Yes  If diabetic, was a CBG obtained today?  No  Did the patient bring in their glucometer from home?  No  How often do you monitor your CBG's? daily.   Financial Strains and Diabetes Management:  Are you having any financial strains with the device, your supplies or your medication? No .  Does the patient want to be seen by Chronic Care Management for management of their diabetes?  No  Would the patient like to be referred to a Nutritionist or for Diabetic Management?  No   Diabetic Exams:  Diabetic Eye Exam: Completed 10/09/18 negative retinopathy.   Diabetic Foot Exam: Completed 07/09/18. Pt has been advised about the importance  in completing this exam. Pt is scheduled for diabetic foot exam on 08/12/19.   How often do you need to have someone help you when you read instructions, pamphlets, or other written materials from your doctor or pharmacy?: 1 - Never  Interpreter Needed?: No  Information entered by :: Clemetine Marker LPN  Past Medical History:  Diagnosis Date  . Acid reflux   . Acute cholangitis 02/01/2019  . 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 Choledocholithiasis   . Diabetes mellitus without complication ([place])   . Elevated lipids   . Glaucoma   . History of detached retina repair   . Hypertension    Past Surgical History:  Procedure Laterality Date  . ANTERIOR LATERAL LUMBAR FUSION WITH PERCUTANEOUS SCREW 2 LEVEL N/A 05/19/2019   Procedure: L2-4 LATERAL LUMBAR INTERBODY FUSION (XLIF);  Surgeon: Meade Maw, MD;  Location: [facility] ORS;  Service: Neurosurgery;  Laterality: N/A;  . CATARACT EXTRACTION    . CHOLECYSTECTOMY, LAPAROSCOPIC  02/05/2019   [facility]  . COLONOSCOPY  2007   sigmoid diverticulosis  . ENDOSCOPIC RETROGRADE CHOLANGIOPANCREATOGRAPHY (ERCP) WITH PROPOFOL N/A 02/02/2019   Procedure: ENDOSCOPIC RETROGRADE CHOLANGIOPANCREATOGRAPHY (ERCP) WITH PROPOFOL;  Surgeon: Lucilla Lame, MD;  Location: [facility] ENDOSCOPY;  Service: Endoscopy;  Laterality: N/A;  . TONSILLECTOMY    . UPPER GASTROINTESTINAL ENDOSCOPY  02/2013   [facility] - done at [facility]  . VAGINAL HYSTERECTOMY     Family History  Problem Relation Age of Onset  . Diabetes Father   . CAD Father   . Breast cancer Neg Hx    Social History   Socioeconomic History  . Marital status: Widowed    Spouse name: Not on file  . Number of children: 2  . Years of education: Not on file  . Highest education level: 12th grade  Occupational History  . Occupation: Retired  Scientific laboratory technician  . Financial resource strain: Not hard at all  . Food insecurity    Worry: Never true    Inability: Never true  . Transportation needs    Medical: No    Non-medical: No  Tobacco Use  . Smoking status: Never Smoker  . Smokeless tobacco: Never Used  . Tobacco comment: smoking cessation materials not required  Substance and Sexual Activity  . Alcohol use: No    Alcohol/week: 0.0 standard drinks  . Drug use: No  . Sexual activity: Not Currently  Lifestyle  . Physical activity    Days per week: 3 days    Minutes per session: 30 min  . Stress: Not at all   Relationships  . Social Herbalist on phone: Patient refused    Gets together: Patient refused    Attends religious service: Patient refused    Active member of club or organization: Patient refused    Attends meetings of clubs or organizations: Patient refused    Relationship status: Widowed  Other Topics Concern  . Not on file  Social History Narrative  . Not on file    Outpatient Encounter Medications as of 07/14/2019  Medication Sig  . Acetaminophen 500 MG coapsule Take 2 capsules by mouth 2 (two) times daily.  Marland Kitchen aspirin EC 81 MG tablet Take 81 mg by mouth daily.  Marland Kitchen atorvastatin (LIPITOR) 20 MG tablet TAKE 1 TABLET DAILY (Patient taking differently: Take 20 mg by mouth every evening. )  . Calcium Carbonate-Vit D-Min (CALCIUM 1200 PO) Take 1 tablet by mouth 3 (three) times a week. At bedtime.  Marland Kitchen  dorzolamide-timolol (COSOPT) 22.3-6.8 MG/ML ophthalmic solution Place 1 drop into the right eye 2 (two) times daily.   . enalapril (VASOTEC) 20 MG tablet TAKE 1 TABLET DAILY (Patient taking differently: Take 20 mg by mouth daily. )  . esomeprazole (NEXIUM) 40 MG capsule Take 1 capsule by mouth 2 (two) times daily.  Marland Kitchen FREESTYLE LITE test strip USE TO TEST BLOOD SUGAR DAILY  . Inulin (FIBER CHOICE FRUITY BITES) 1.5 g CHEW Chew 1 tablet by mouth 2 (two) times daily.   . Lancets (FREESTYLE) lancets USE 1 LANCET DAILY  . latanoprost (XALATAN) 0.005 % ophthalmic solution Place 1 drop into both eyes at bedtime.  . Menthol, Topical Analgesic, (BIOFREEZE) 10 % LIQD Apply topically. biofreeze  . metFORMIN (GLUCOPHAGE) 500 MG tablet TAKE 1 TABLET DAILY (Patient taking differently: Take 500 mg by mouth daily. )  . methocarbamol (ROBAXIN) 500 MG tablet Take 1.5 tablets (750 mg total) by mouth every 6 (six) hours as needed for muscle spasms.  . Multiple Vitamin (MULTIVITAMIN WITH MINERALS) TABS tablet Take 1 tablet by mouth every evening. Women's Centrum Silver  . polyethylene glycol (MIRALAX /  GLYCOLAX) 17 g packet Take 17 g by mouth at bedtime as needed (constipation.).  Marland Kitchen spironolactone (ALDACTONE) 25 MG tablet TAKE 1 TABLET DAILY (Patient taking differently: Take 25 mg by mouth daily. )  . timolol (TIMOPTIC) 0.5 % ophthalmic solution Place 1 drop into the left eye daily.   . ASPERCREME LIDOCAINE EX Apply 1 application topically 3 (three) times daily as needed (pain.).  . [DISCONTINUED] Camphor-Menthol-Methyl Sal (MUSCLE RUB ULTRA STRENGTH) 01-07-29 % CREA Apply 1 application topically 4 (four) times daily as needed (pain).  . [DISCONTINUED] celecoxib (CELEBREX) 100 MG capsule Take 1 capsule (100 mg total) by mouth 2 (two) times daily.  . [DISCONTINUED] mupirocin cream (BACTROBAN) 2 % Apply 1 application topically daily. (Patient not taking: Reported on 05/04/2019)  . [DISCONTINUED] pantoprazole (PROTONIX) 40 MG tablet TAKE 1 TABLET DAILY (Patient taking differently: Take 40 mg by mouth 2 (two) times daily. )   No facility-administered encounter medications on file as of 07/14/2019.     Activities of Daily Living In your present state of health, do you have any difficulty performing the following activities: 07/14/2019 05/19/2019  Hearing? N -  Comment declines hearing aids -  Vision? N -  Difficulty concentrating or making decisions? N -  Walking or climbing stairs? N -  Dressing or bathing? N -  Doing errands, shopping? N N  Preparing Food and eating ? N -  Using the Toilet? N -  In the past six months, have you accidently leaked urine? N -  Do you have problems with loss of bowel control? N -  Managing your Medications? N -  Managing your Finances? N -  Housekeeping or managing your Housekeeping? N -  Some recent data might be hidden    Patient Care Team: Glean Hess, MD as PCP - General (Internal Medicine) Florinda Marker Early Chars, MD as Consulting Physician (Ophthalmology) Malissa Hippo, Gaspar Skeeters, MD as Consulting Physician (Gastroenterology) Lovell Sheehan, MD as  Consulting Physician (Orthopedic Surgery) Meade Maw, MD as Consulting Physician (Neurosurgery)    Assessment:   This is a routine wellness examination for [place].  Exercise Activities and Dietary recommendations Current Exercise Habits: Home exercise routine, Type of exercise: walking, Time (Minutes): 30, Frequency (Times/Week): 3, Weekly Exercise (Minutes/Week): 90, Intensity: Mild, Exercise limited by: orthopedic condition(s)  Goals    . continue activity  Taking a class in [place] in [place] with church. She also walks 2-3 days a week 30 min and helps at church every Wed night.    Marland Kitchen DIET - EAT MORE FRUITS AND VEGETABLES     Recommend to eat 3 small healthy meals and at least 2 healthy snacks per day.     . Increase water intake     Recommend to increase fluid intake from 3-4 glasses per day to 4-6 glasses per day.       Fall Risk Fall Risk  07/14/2019 02/15/2019 01/07/2019 10/28/2018 08/20/2018  Falls in the past year? 0 0 0 0 0  Comment - - - - Emmi Telephone Survey: data to providers prior to load  Number falls in past yr: 0 0 0 0 -  Injury with Fall? 0 0 0 0 -  Risk for fall due to : - - History of fall(s);Impaired balance/gait;Impaired mobility - -  Risk for fall due to: Comment - - - - -  Follow up Falls prevention discussed Falls evaluation completed - Falls evaluation completed -   FALL RISK PREVENTION PERTAINING TO THE HOME:  Any stairs in or around the home? No  If so, do they handrails? No   Home free of loose throw rugs in walkways, pet beds, electrical cords, etc? Yes  Adequate lighting in your home to reduce risk of falls? Yes   ASSISTIVE DEVICES UTILIZED TO PREVENT FALLS:  Life alert? No  Use of a cane, walker or w/c? Yes  Grab bars in the bathroom? Yes  Shower chair or bench in shower? Yes  Elevated toilet seat or a handicapped toilet? Yes   DME ORDERS:  DME order needed?  No   TIMED UP AND GO:  Was the test performed? No . Telephonic  visit.   Education: Fall risk prevention has been discussed.  Intervention(s) required? No    Depression Screen PHQ 2/9 Scores 07/14/2019 02/15/2019 01/07/2019 10/28/2018  PHQ - 2 Score 0 0 4 0  PHQ- 9 Score - 0 18 -     Cognitive Function     6CIT Screen 07/14/2019 07/08/2018 07/07/2017 07/04/2016  What Year? 0 points 0 points 0 points 0 points  What month? 0 points 0 points 0 points 0 points  What time? 0 points 0 points 0 points 0 points  Count back from 20 0 points 0 points 0 points 0 points  Months in reverse 0 points 0 points 0 points 0 points  Repeat phrase 2 points 0 points 0 points 0 points  Total Score 2 0 0 0    Immunization History  Administered Date(s) Administered  . Influenza, High Dose Seasonal PF 07/07/2017, 07/08/2018  . Influenza, Seasonal, Injecte, Preservative Fre 08/19/2013  . Influenza,inj,Quad PF,6+ Mos 06/23/2015, 07/04/2016  . Influenza-Unspecified 06/18/2011, 08/21/2012  . Pneumococcal Conjugate-13 06/20/2014  . Pneumococcal Polysaccharide-23 10/02/2007  . Tdap 10/01/2010    Qualifies for Shingles Vaccine? Yes . Due for Shingrix. Education has been provided regarding the importance of this vaccine. Pt has been advised to call insurance company to determine out of pocket expense. Advised may also receive vaccine at local pharmacy or Health Dept. Verbalized acceptance and understanding.  Tdap: Up to date  Flu Vaccine: Due for Flu vaccine. Does the patient want to receive this vaccine today?  No . Education has been provided regarding the importance of this vaccine but still declined. Advised may receive this vaccine at local pharmacy or Health Dept. Aware to provide a copy  of the vaccination record if obtained from local pharmacy or Health Dept. Verbalized acceptance and understanding.  Pneumococcal Vaccine: Up to date  Screening Tests Health Maintenance  Topic Date Due  . INFLUENZA VACCINE  05/01/2019  . FOOT EXAM  07/10/2019  . HEMOGLOBIN A1C   08/05/2019  . OPHTHALMOLOGY EXAM  10/10/2019  . MAMMOGRAM  03/01/2020  . TETANUS/TDAP  10/01/2020  . PNA vac Low Risk Adult  Completed  . DEXA SCAN  Addressed    Cancer Screenings:  Colorectal Screening: Cologuard negative 08/18/17. No longer required.   Mammogram: Completed 03/02/19. Repeat every year.  Bone Density: Completed 07/21/18. Results reflect OSTEOPOROSIS. Repeat every 2 years.   Lung Cancer Screening: (Low Dose CT Chest recommended if Age 81-80 years, 30 pack-year currently smoking OR have quit w/in 15years.) does not qualify.    Additional Screening:  Hepatitis C Screening: no longer required   Vision Screening: Recommended annual ophthalmology exams for early detection of glaucoma and other disorders of the eye. Is the patient up to date with their annual eye exam?  Yes  Who is the provider or what is the name of the office in which the pt attends annual eye exams? Dr. Florinda Marker  Dental Screening: Recommended annual dental exams for proper oral hygiene  Community Resource Referral:  CRR required this visit?  No       Plan:     I have personally reviewed and addressed the Medicare Annual Wellness questionnaire and have noted the following in the patient's chart:  A. Medical and social history B. Use of alcohol, tobacco or illicit drugs  C. Current medications and supplements D. Functional ability and status E.  Nutritional status F.  Physical activity G. Advance directives H. List of other physicians I.  Hospitalizations, surgeries, and ER visits in previous 12 months J.  [place] such as hearing and vision if needed, cognitive and depression L. Referrals and appointments   In addition, I have reviewed and discussed with patient certain preventive protocols, quality metrics, and best practice recommendations. A written personalized care plan for preventive services as well as general preventive health recommendations were provided to  patient.   Signed,  Clemetine Marker, LPN Nurse Health Advisor   Nurse Notes: pt doing well and appreciative of visit today

## 2019-07-14 NOTE — Patient Instructions (Signed)
[name] , Thank you for taking time to come for your Medicare Wellness Visit. I appreciate your ongoing commitment to your health goals. Please review the following plan we discussed and let me know if I can assist you in the future.   Screening recommendations/referrals: Colonoscopy: Cologuard done 08/18/17 Mammogram: done 03/02/19 Bone Density: done 07/21/18 Recommended yearly ophthalmology/optometry visit for glaucoma screening and checkup Recommended yearly dental visit for hygiene and checkup  Vaccinations: Influenza vaccine: due Pneumococcal vaccine: done 06/20/14 Tdap vaccine: done 2012 Shingles vaccine: Shingrix discussed. Please contact your pharmacy for coverage information.     Conditions/risks identified: Recommend eating more fresh fruits and vegetables and avoid processed foods  Next appointment: Please follow up in one year for your Medicare Annual Wellness visit.     Preventive Care 38 Years and Older, Female Preventive care refers to lifestyle choices and visits with your health care provider that can promote health and wellness. What does preventive care include?  A yearly physical exam. This is also called an annual well check.  Dental exams once or twice a year.  Routine eye exams. Ask your health care provider how often you should have your eyes checked.  Personal lifestyle choices, including:  Daily care of your teeth and gums.  Regular physical activity.  Eating a healthy diet.  Avoiding tobacco and drug use.  Limiting alcohol use.  Practicing safe sex.  Taking low-dose aspirin every day.  Taking vitamin and mineral supplements as recommended by your health care provider. What happens during an annual well check? The services and screenings done by your health care provider during your annual well check will depend on your age, overall health, lifestyle risk factors, and family history of disease. Counseling  Your health care provider may  ask you questions about your:  Alcohol use.  Tobacco use.  Drug use.  Emotional well-being.  Home and relationship well-being.  Sexual activity.  Eating habits.  History of falls.  Memory and ability to understand (cognition).  Work and work Statistician.  Reproductive health. Screening  You may have the following tests or measurements:  Height, weight, and BMI.  Blood pressure.  Lipid and cholesterol levels. These may be checked every 5 years, or more frequently if you are over 24 years old.  Skin check.  Lung cancer screening. You may have this screening every year starting at age 30 if you have a 30-pack-year history of smoking and currently smoke or have quit within the past 15 years.  Fecal occult blood test (FOBT) of the stool. You may have this test every year starting at age 37.  Flexible sigmoidoscopy or colonoscopy. You may have a sigmoidoscopy every 5 years or a colonoscopy every 10 years starting at age 83.  Hepatitis C blood test.  Hepatitis B blood test.  Sexually transmitted disease (STD) testing.  Diabetes screening. This is done by checking your blood sugar (glucose) after you have not eaten for a while (fasting). You may have this done every 1-3 years.  Bone density scan. This is done to screen for osteoporosis. You may have this done starting at age 16.  Mammogram. This may be done every 1-2 years. Talk to your health care provider about how often you should have regular mammograms. Talk with your health care provider about your test results, treatment options, and if necessary, the need for more tests. Vaccines  Your health care provider may recommend certain vaccines, such as:  Influenza vaccine. This is recommended every year.  Tetanus, diphtheria, and acellular pertussis (Tdap, Td) vaccine. You may need a Td booster every 10 years.  Zoster vaccine. You may need this after age 27.  Pneumococcal 13-valent conjugate (PCV13) vaccine. One  dose is recommended after age 48.  Pneumococcal polysaccharide (PPSV23) vaccine. One dose is recommended after age 72. Talk to your health care provider about which screenings and vaccines you need and how often you need them. This information is not intended to replace advice given to you by your health care provider. Make sure you discuss any questions you have with your health care provider. Document Released: 10/13/2015 Document Revised: 06/05/2016 Document Reviewed: 07/18/2015 Elsevier Interactive Patient Education  2017 [place] Prevention in the Home Falls can cause injuries. They can happen to people of all ages. There are many things you can do to make your home safe and to help prevent falls. What can I do on the outside of my home?  Regularly fix the edges of walkways and driveways and fix any cracks.  Remove anything that might make you trip as you walk through a door, such as a raised step or threshold.  Trim any bushes or trees on the path to your home.  Use bright outdoor lighting.  Clear any walking paths of anything that might make someone trip, such as rocks or tools.  Regularly check to see if handrails are loose or broken. Make sure that both sides of any steps have handrails.  Any raised decks and porches should have guardrails on the edges.  Have any leaves, snow, or ice cleared regularly.  Use sand or salt on walking paths during winter.  Clean up any spills in your garage right away. This includes oil or grease spills. What can I do in the bathroom?  Use night lights.  Install grab bars by the toilet and in the tub and shower. Do not use towel bars as grab bars.  Use non-skid mats or decals in the tub or shower.  If you need to sit down in the shower, use a plastic, non-slip stool.  Keep the floor dry. Clean up any water that spills on the floor as soon as it happens.  Remove soap buildup in the tub or shower regularly.  Attach bath  mats securely with double-sided non-slip rug tape.  Do not have throw rugs and other things on the floor that can make you trip. What can I do in the bedroom?  Use night lights.  Make sure that you have a light by your bed that is easy to reach.  Do not use any sheets or blankets that are too big for your bed. They should not hang down onto the floor.  Have a firm chair that has side arms. You can use this for support while you get dressed.  Do not have throw rugs and other things on the floor that can make you trip. What can I do in the kitchen?  Clean up any spills right away.  Avoid walking on wet floors.  Keep items that you use a lot in easy-to-reach places.  If you need to reach something above you, use a strong step stool that has a grab bar.  Keep electrical cords out of the way.  Do not use floor polish or wax that makes floors slippery. If you must use wax, use non-skid floor wax.  Do not have throw rugs and other things on the floor that can make you trip. What can I do  with my stairs?  Do not leave any items on the stairs.  Make sure that there are handrails on both sides of the stairs and use them. Fix handrails that are broken or loose. Make sure that handrails are as long as the stairways.  Check any carpeting to make sure that it is firmly attached to the stairs. Fix any carpet that is loose or worn.  Avoid having throw rugs at the top or bottom of the stairs. If you do have throw rugs, attach them to the floor with carpet tape.  Make sure that you have a light switch at the top of the stairs and the bottom of the stairs. If you do not have them, ask someone to add them for you. What else can I do to help prevent falls?  Wear shoes that:  Do not have high heels.  Have rubber bottoms.  Are comfortable and fit you well.  Are closed at the toe. Do not wear sandals.  If you use a stepladder:  Make sure that it is fully opened. Do not climb a closed  stepladder.  Make sure that both sides of the stepladder are locked into place.  Ask someone to hold it for you, if possible.  Clearly mark and make sure that you can see:  Any grab bars or handrails.  First and last steps.  Where the edge of each step is.  Use tools that help you move around (mobility aids) if they are needed. These include:  Canes.  Walkers.  Scooters.  Crutches.  Turn on the lights when you go into a dark area. Replace any light bulbs as soon as they burn out.  Set up your furniture so you have a clear path. Avoid moving your furniture around.  If any of your floors are uneven, fix them.  If there are any pets around you, be aware of where they are.  Review your medicines with your doctor. Some medicines can make you feel dizzy. This can increase your chance of falling. Ask your doctor what other things that you can do to help prevent falls. This information is not intended to replace advice given to you by your health care provider. Make sure you discuss any questions you have with your health care provider. Document Released: 07/13/2009 Document Revised: 02/22/2016 Document Reviewed: 10/21/2014 Elsevier Interactive Patient Education  2017 [employer].

## 2019-07-29 DIAGNOSIS — M419 Scoliosis, unspecified: Secondary | ICD-10-CM | POA: Diagnosis not present

## 2019-07-29 DIAGNOSIS — M5416 Radiculopathy, lumbar region: Secondary | ICD-10-CM | POA: Diagnosis not present

## 2019-07-29 DIAGNOSIS — M4326 Fusion of spine, lumbar region: Secondary | ICD-10-CM | POA: Diagnosis not present

## 2019-07-29 DIAGNOSIS — Z981 Arthrodesis status: Secondary | ICD-10-CM | POA: Diagnosis not present

## 2019-08-11 ENCOUNTER — Other Ambulatory Visit: Payer: Self-pay | Admitting: Sports Medicine

## 2019-08-11 DIAGNOSIS — G8929 Other chronic pain: Secondary | ICD-10-CM

## 2019-08-11 DIAGNOSIS — M25561 Pain in right knee: Secondary | ICD-10-CM | POA: Diagnosis not present

## 2019-08-12 ENCOUNTER — Ambulatory Visit (INDEPENDENT_AMBULATORY_CARE_PROVIDER_SITE_OTHER): Payer: Medicare Other | Admitting: Internal Medicine

## 2019-08-12 ENCOUNTER — Encounter: Payer: Self-pay | Admitting: Internal Medicine

## 2019-08-12 ENCOUNTER — Other Ambulatory Visit: Payer: Self-pay

## 2019-08-12 VITALS — BP 118/64 | HR 74 | Ht 61.0 in | Wt 115.6 lb

## 2019-08-12 DIAGNOSIS — E785 Hyperlipidemia, unspecified: Secondary | ICD-10-CM

## 2019-08-12 DIAGNOSIS — E1169 Type 2 diabetes mellitus with other specified complication: Secondary | ICD-10-CM | POA: Diagnosis not present

## 2019-08-12 DIAGNOSIS — I10 Essential (primary) hypertension: Secondary | ICD-10-CM

## 2019-08-12 DIAGNOSIS — E118 Type 2 diabetes mellitus with unspecified complications: Secondary | ICD-10-CM

## 2019-08-12 DIAGNOSIS — K2270 Barrett's esophagus without dysplasia: Secondary | ICD-10-CM | POA: Diagnosis not present

## 2019-08-12 DIAGNOSIS — I6523 Occlusion and stenosis of bilateral carotid arteries: Secondary | ICD-10-CM | POA: Diagnosis not present

## 2019-08-12 DIAGNOSIS — Z23 Encounter for immunization: Secondary | ICD-10-CM

## 2019-08-12 NOTE — Progress Notes (Signed)
Date:  08/12/2019   Name:  Lori Harrell   DOB:  [birth year]   MRN:  [id number]   Chief Complaint: Diabetes (High dose flu shot.) and Hypertension  Diabetes She presents for her follow-up diabetic visit. She has type 2 diabetes mellitus. Her disease course has been stable. Pertinent negatives for hypoglycemia include no headaches or tremors. Pertinent negatives for diabetes include no chest pain, no fatigue, no polydipsia and no polyuria. Current diabetic treatment includes oral agent (monotherapy) (metformin). Her weight is stable. There is no change in her home blood glucose trend. An ACE inhibitor/angiotensin II receptor blocker is being taken. Eye exam is current.  Hypertension This is a chronic problem. The problem is controlled. Pertinent negatives include no chest pain, headaches, palpitations or shortness of breath. Past treatments include diuretics and ACE inhibitors. The current treatment provides significant improvement.  Barrett's Esophagus - She is having follow up EGD next month to monitor BE seen in 2017.  She also has a very large [place] that may need to be repaired.  She denies choking sensation or dysphagia.  She takes Nexium bid.  Lab Results  Component Value Date   CREATININE 0.60 05/10/2019   BUN 15 05/10/2019   NA 139 05/10/2019   K 3.8 05/10/2019   CL 103 05/10/2019   CO2 25 05/10/2019   Lab Results  Component Value Date   CHOL 140 07/09/2018   HDL 51 07/09/2018   LDLCALC 76 07/09/2018   TRIG 65 07/09/2018   CHOLHDL 2.7 07/09/2018   Lab Results  Component Value Date   TSH 4.760 (H) 07/09/2018   Lab Results  Component Value Date   HGBA1C 6.2 (H) 02/02/2019     Review of Systems  Constitutional: Negative for appetite change, fatigue, fever and unexpected weight change.  HENT: Negative for tinnitus and trouble swallowing.   Eyes: Negative for visual disturbance.  Respiratory: Negative for cough, chest tightness and shortness of breath.    Cardiovascular: Negative for chest pain, palpitations and leg swelling.  Gastrointestinal: Negative for abdominal pain.  Endocrine: Negative for polydipsia and polyuria.  Genitourinary: Negative for dysuria and hematuria.  Musculoskeletal: Negative for arthralgias.  Neurological: Negative for tremors, numbness and headaches.  Psychiatric/Behavioral: Negative for dysphoric mood.    Patient Active Problem List   Diagnosis Date Noted  . S/P lumbar fusion 05/19/2019  . 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 Unstageable pressure ulcer of back ([place]) 01/07/2019  . DDD (degenerative disc disease), lumbar 01/07/2019  . Carotid artery stenosis, asymptomatic, bilateral 07/07/2017  . Primary open angle glaucoma 05/08/2017  . 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 Large hiatal hernia 07/04/2016  . Sigmoid diverticulitis 07/04/2016  . Stress incontinence 07/04/2016  . Type II diabetes mellitus with manifestations ([place]) 10/31/2015  . Constipation 10/31/2015  . 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 Essential (primary) hypertension 06/16/2015  . Hyperlipidemia associated with type 2 diabetes mellitus ([place]) 06/16/2015  . OP (osteoporosis) 06/16/2015  . Hypercalcemia 04/22/2012    No Known Allergies  Past Surgical History:  Procedure Laterality Date  . ANTERIOR LATERAL LUMBAR FUSION WITH PERCUTANEOUS SCREW 2 LEVEL N/A 05/19/2019   Procedure: L2-4 LATERAL LUMBAR INTERBODY FUSION (XLIF);  Surgeon: Meade Maw, MD;  Location: [facility] ORS;  Service: Neurosurgery;  Laterality: N/A;  . CATARACT EXTRACTION    . CHOLECYSTECTOMY, LAPAROSCOPIC  02/05/2019   [facility]  . COLONOSCOPY  2007   sigmoid diverticulosis  . ENDOSCOPIC RETROGRADE CHOLANGIOPANCREATOGRAPHY (ERCP) WITH PROPOFOL N/A 02/02/2019  Procedure: ENDOSCOPIC RETROGRADE CHOLANGIOPANCREATOGRAPHY (ERCP) WITH PROPOFOL;  Surgeon: Lucilla Lame, MD;  Location: [facility] ENDOSCOPY;  Service: Endoscopy;  Laterality: N/A;  . TONSILLECTOMY    . UPPER  GASTROINTESTINAL ENDOSCOPY  02/2013   Barrett's - done at [place] History   Tobacco Use  . Smoking status: Never Smoker  . Smokeless tobacco: Never Used  . Tobacco comment: smoking cessation materials not required  Substance Use Topics  . Alcohol use: No    Alcohol/week: 0.0 standard drinks  . Drug use: No     Medication list has been reviewed and updated.  Current Meds  Medication Sig  . ASPERCREME LIDOCAINE EX Apply 1 application topically 3 (three) times daily as needed (pain.).  Marland Kitchen aspirin EC 81 MG tablet Take 81 mg by mouth daily.  Marland Kitchen atorvastatin (LIPITOR) 20 MG tablet TAKE 1 TABLET DAILY (Patient taking differently: Take 20 mg by mouth every evening. )  . Calcium Carbonate-Vit D-Min (CALCIUM 1200 PO) Take 1 tablet by mouth 3 (three) times a week. At bedtime.  . dorzolamide-timolol (COSOPT) 22.3-6.8 MG/ML ophthalmic solution Place 1 drop into the right eye 2 (two) times daily.   . enalapril (VASOTEC) 20 MG tablet TAKE 1 TABLET DAILY (Patient taking differently: Take 20 mg by mouth daily. )  . esomeprazole (NEXIUM) 40 MG capsule Take 1 capsule by mouth 2 (two) times daily.  Marland Kitchen FREESTYLE LITE test strip USE TO TEST BLOOD SUGAR DAILY  . Inulin (FIBER CHOICE FRUITY BITES) 1.5 g CHEW Chew 1 tablet by mouth 2 (two) times daily.   . Lancets (FREESTYLE) lancets USE 1 LANCET DAILY  . latanoprost (XALATAN) 0.005 % ophthalmic solution Place 1 drop into both eyes at bedtime.  . Menthol, Topical Analgesic, (BIOFREEZE) 10 % LIQD Apply topically. biofreeze  . metFORMIN (GLUCOPHAGE) 500 MG tablet TAKE 1 TABLET DAILY (Patient taking differently: Take 500 mg by mouth daily. )  . Multiple Vitamin (MULTIVITAMIN WITH MINERALS) TABS tablet Take 1 tablet by mouth every evening. Women's Centrum Silver  . naproxen (NAPROSYN) 500 MG tablet Take 500 mg by mouth 2 (two) times daily with a meal.  . polyethylene glycol (MIRALAX / GLYCOLAX) 17 g packet Take 17 g by mouth  at bedtime as needed (constipation.).  Marland Kitchen spironolactone (ALDACTONE) 25 MG tablet TAKE 1 TABLET DAILY (Patient taking differently: Take 25 mg by mouth daily. )  . timolol (TIMOPTIC) 0.5 % ophthalmic solution Place 1 drop into the left eye daily.     PHQ 2/9 Scores 08/12/2019 07/14/2019 02/15/2019 01/07/2019  PHQ - 2 Score 0 0 0 4  PHQ- 9 Score - - 0 18    BP Readings from Last 3 Encounters:  08/12/19 118/64  05/21/19 131/66  05/10/19 (!) 142/67    Physical Exam Vitals signs and nursing note reviewed.  Constitutional:      General: She is not in acute distress.    Appearance: She is well-developed.  HENT:     Head: Normocephalic and atraumatic.  Neck:     Musculoskeletal: Normal range of motion.  Cardiovascular:     Rate and Rhythm: Normal rate and regular rhythm.     Pulses: Normal pulses.  Pulmonary:     Effort: Pulmonary effort is normal. No respiratory distress.  Abdominal:     General: Abdomen is flat. There is no distension.     Palpations: Abdomen is soft. There is no mass.     Tenderness: There is no  abdominal tenderness.  Musculoskeletal:     Right lower leg: No edema.     Left lower leg: No edema.  Lymphadenopathy:     Cervical: No cervical adenopathy.  Skin:    General: Skin is warm and dry.     Capillary Refill: Capillary refill takes less than 2 seconds.     Findings: No rash.  Neurological:     General: No focal deficit present.     Mental Status: She is alert and oriented to person, place, and time.  Psychiatric:        Behavior: Behavior normal.        Thought Content: Thought content normal.     Wt Readings from Last 3 Encounters:  08/12/19 115 lb 9.6 oz (52.4 kg)  07/14/19 113 lb (51.3 kg)  05/20/19 118 lb 6.2 oz (53.7 kg)    BP 118/64   Pulse 74   Ht 5\' 1"  (1.549 m)   Wt 115 lb 9.6 oz (52.4 kg)   SpO2 100%   BMI 21.84 kg/m   Assessment and Plan: 1. Type II diabetes mellitus with complication (HCC) Clinically stable by exam and report  without s/s of hypoglycemia. DM complicated by HTN and lipids. Tolerating medications metformin 500 mg daily well without side effects or other concerns. - Hemoglobin [id number] - Basic metabolic panel  2. Essential (primary) hypertension Clinically stable exam with well controlled BP.   Tolerating medications, enalapril and aldactone, without side effects at this time. Pt to continue current regimen and low sodium diet; benefits of regular exercise as able discussed.  3. Hyperlipidemia associated with type 2 diabetes mellitus ([place]) Tolerating high intensity statin medication without side effects at this time LDL is at goal of < 70 on current dose Continue same therapy without change at this time  4. Barrett's esophagus without dysplasia EGD next month Continue bid Nexium  5. Need for immunization against influenza - Flu Vaccine QUAD High Dose(Fluad)   Partially dictated using Editor, commissioning. Any errors are unintentional.  Halina Maidens, MD [facility]  08/12/2019

## 2019-08-13 LAB — BASIC METABOLIC PANEL
BUN/Creatinine Ratio: 18 (ref 12–28)
BUN: 14 mg/dL (ref 8–27)
CO2: 23 mmol/L (ref 20–29)
Calcium: 10.1 mg/dL (ref 8.7–10.3)
Chloride: 99 mmol/L (ref 96–106)
Creatinine, Ser: 0.77 mg/dL (ref 0.57–1.00)
GFR calc Af Amer: 85 mL/min/{1.73_m2} (ref >59)
GFR calc non Af Amer: 74 mL/min/{1.73_m2} (ref >59)
Glucose: 104 mg/dL — ABNORMAL HIGH (ref 65–99)
Potassium: 4.3 mmol/L (ref 3.5–5.2)
Sodium: 137 mmol/L (ref 134–144)

## 2019-08-13 LAB — HEMOGLOBIN A1C
Est. average glucose Bld gHb Est-mCnc: 126 mg/dL
Hgb A1c MFr Bld: 6 % — ABNORMAL HIGH (ref 4.8–5.6)

## 2019-08-25 ENCOUNTER — Other Ambulatory Visit: Payer: Self-pay

## 2019-08-25 ENCOUNTER — Ambulatory Visit
Admission: RE | Admit: 2019-08-25 | Discharge: 2019-08-25 | Disposition: A | Discharge status: Home / Self Care | Payer: Medicare Other | Source: Ambulatory Visit | Attending: Sports Medicine | Admitting: Sports Medicine

## 2019-08-25 DIAGNOSIS — G8929 Other chronic pain: Secondary | ICD-10-CM | POA: Diagnosis not present

## 2019-08-25 DIAGNOSIS — M25561 Pain in right knee: Secondary | ICD-10-CM | POA: Diagnosis not present

## 2019-09-12 DIAGNOSIS — Z01812 Encounter for preprocedural laboratory examination: Secondary | ICD-10-CM | POA: Diagnosis not present

## 2019-09-15 DIAGNOSIS — K449 Diaphragmatic hernia without obstruction or gangrene: Secondary | ICD-10-CM | POA: Diagnosis not present

## 2019-09-15 DIAGNOSIS — K2270 Barrett's esophagus without dysplasia: Secondary | ICD-10-CM | POA: Diagnosis not present

## 2019-09-15 HISTORY — PX: ESOPHAGOGASTRODUODENOSCOPY: SHX1529

## 2019-10-09 ENCOUNTER — Other Ambulatory Visit: Payer: Self-pay | Admitting: Internal Medicine

## 2019-10-09 DIAGNOSIS — E1169 Type 2 diabetes mellitus with other specified complication: Secondary | ICD-10-CM

## 2019-10-09 DIAGNOSIS — I10 Essential (primary) hypertension: Secondary | ICD-10-CM

## 2019-10-09 DIAGNOSIS — E119 Type 2 diabetes mellitus without complications: Secondary | ICD-10-CM

## 2019-10-12 DIAGNOSIS — K219 Gastro-esophageal reflux disease without esophagitis: Secondary | ICD-10-CM | POA: Diagnosis not present

## 2019-10-12 DIAGNOSIS — K449 Diaphragmatic hernia without obstruction or gangrene: Secondary | ICD-10-CM | POA: Diagnosis not present

## 2019-10-12 DIAGNOSIS — K2100 Gastro-esophageal reflux disease with esophagitis, without bleeding: Secondary | ICD-10-CM | POA: Diagnosis not present

## 2019-10-12 DIAGNOSIS — K2270 Barrett's esophagus without dysplasia: Secondary | ICD-10-CM | POA: Diagnosis not present

## 2019-10-14 DIAGNOSIS — Z1211 Encounter for screening for malignant neoplasm of colon: Secondary | ICD-10-CM | POA: Diagnosis not present

## 2019-10-14 DIAGNOSIS — K2270 Barrett's esophagus without dysplasia: Secondary | ICD-10-CM | POA: Diagnosis not present

## 2019-10-14 DIAGNOSIS — K219 Gastro-esophageal reflux disease without esophagitis: Secondary | ICD-10-CM | POA: Diagnosis not present

## 2019-10-31 ENCOUNTER — Other Ambulatory Visit: Payer: Self-pay | Admitting: Internal Medicine

## 2019-10-31 DIAGNOSIS — E119 Type 2 diabetes mellitus without complications: Secondary | ICD-10-CM

## 2019-11-10 DIAGNOSIS — H401113 Primary open-angle glaucoma, right eye, severe stage: Secondary | ICD-10-CM | POA: Diagnosis not present

## 2019-11-10 DIAGNOSIS — H338 Other retinal detachments: Secondary | ICD-10-CM | POA: Diagnosis not present

## 2019-11-10 DIAGNOSIS — H401121 Primary open-angle glaucoma, left eye, mild stage: Secondary | ICD-10-CM | POA: Diagnosis not present

## 2019-11-10 DIAGNOSIS — E119 Type 2 diabetes mellitus without complications: Secondary | ICD-10-CM | POA: Diagnosis not present

## 2019-11-10 DIAGNOSIS — Z961 Presence of intraocular lens: Secondary | ICD-10-CM | POA: Diagnosis not present

## 2019-11-10 LAB — HM DIABETES EYE EXAM

## 2019-11-29 DIAGNOSIS — H02423 Myogenic ptosis of bilateral eyelids: Secondary | ICD-10-CM | POA: Diagnosis not present

## 2019-11-29 DIAGNOSIS — H53413 Scotoma involving central area, bilateral: Secondary | ICD-10-CM | POA: Diagnosis not present

## 2019-12-10 ENCOUNTER — Encounter: Payer: Self-pay | Admitting: Internal Medicine

## 2019-12-10 ENCOUNTER — Ambulatory Visit (INDEPENDENT_AMBULATORY_CARE_PROVIDER_SITE_OTHER): Payer: Medicare Other | Admitting: Internal Medicine

## 2019-12-10 ENCOUNTER — Other Ambulatory Visit: Payer: Self-pay

## 2019-12-10 VITALS — BP 140/78 | HR 62 | Temp 97.3°F | Resp 16 | Ht 61.0 in | Wt 126.0 lb

## 2019-12-10 DIAGNOSIS — I10 Essential (primary) hypertension: Secondary | ICD-10-CM

## 2019-12-10 DIAGNOSIS — E1169 Type 2 diabetes mellitus with other specified complication: Secondary | ICD-10-CM | POA: Diagnosis not present

## 2019-12-10 DIAGNOSIS — E785 Hyperlipidemia, unspecified: Secondary | ICD-10-CM | POA: Diagnosis not present

## 2019-12-10 DIAGNOSIS — Z0000 Encounter for general adult medical examination without abnormal findings: Secondary | ICD-10-CM | POA: Diagnosis not present

## 2019-12-10 DIAGNOSIS — K2270 Barrett's esophagus without dysplasia: Secondary | ICD-10-CM

## 2019-12-10 DIAGNOSIS — E118 Type 2 diabetes mellitus with unspecified complications: Secondary | ICD-10-CM

## 2019-12-10 LAB — POCT URINALYSIS DIPSTICK
Bilirubin, UA: NEGATIVE
Blood, UA: NEGATIVE
Glucose, UA: NEGATIVE
Ketones, UA: NEGATIVE
Leukocytes, UA: NEGATIVE
Nitrite, UA: NEGATIVE
Protein, UA: NEGATIVE
Spec Grav, UA: 1.02 (ref 1.010–1.025)
Urobilinogen, UA: 0.2 E.U./dL
pH, UA: 6.5 (ref 5.0–8.0)

## 2019-12-10 NOTE — Patient Instructions (Signed)
Covid Vaccine locations: [employer] [facility] [place] [phone]  [email or web address]  Or go on the [facility] website and search for vaccine appointments

## 2019-12-10 NOTE — Progress Notes (Signed)
Date:  12/10/2019   Name:  Lori Harrell   DOB:  23-Jun-1941   MRN:  [id number]   Chief Complaint: Annual Exam (fasting today.  BS 96 this morning ) Lori Harrell is a 79 y.o. female who presents today for her Complete Annual Exam. She feels well. She reports exercising rarely. She reports she is sleeping fairly well. She denies breast issues.  She has decided to stop routine screening mammograms.  Mammogram  03/2019 DEXA  06/2018 Colonoscopy - aged out Immunization History  Administered Date(s) Administered  . Fluad Quad(high Dose 65+) 08/12/2019  . Influenza, High Dose Seasonal PF 07/07/2017, 07/08/2018  . Influenza, Seasonal, Injecte, Preservative Fre 08/19/2013  . Influenza,inj,Quad PF,6+ Mos 06/23/2015, 07/04/2016  . Influenza-Unspecified 06/18/2011, 08/21/2012  . Pneumococcal Conjugate-13 06/20/2014  . Pneumococcal Polysaccharide-23 10/02/2007  . Tdap 10/01/2010    Hypertension This is a chronic problem. The problem is controlled. Pertinent negatives include no chest pain, headaches, palpitations or shortness of breath. Past treatments include ACE inhibitors and diuretics. The current treatment provides significant improvement.  Diabetes She presents for her follow-up diabetic visit. She has type 2 diabetes mellitus. Her disease course has been stable. There are no hypoglycemic associated symptoms. Pertinent negatives for hypoglycemia include no dizziness, headaches, nervousness/anxiousness or tremors. Pertinent negatives for diabetes include no chest pain, no fatigue, no polydipsia and no polyuria. Her weight is stable. She is following a generally healthy diet. She monitors blood glucose at home 1-2 x per day. Her breakfast blood glucose is taken between 6-7 am. Her breakfast blood glucose range is generally 90-110 mg/dl. An ACE inhibitor/angiotensin II receptor blocker is being taken. Eye exam is current.  Gastroesophageal Reflux She reports no abdominal pain, no  chest pain, no choking, no coughing, no heartburn or no wheezing. This is a chronic problem. Nothing aggravates the symptoms. Pertinent negatives include no fatigue. Risk factors include Barrett's esophagus and hiatal hernia (very large HH but surgery was not recommended). She has tried a PPI for the symptoms. The treatment provided significant relief.  Hyperlipidemia This is a chronic problem. The problem is controlled. Pertinent negatives include no chest pain or shortness of breath. Current antihyperlipidemic treatment includes statins. The current treatment provides significant improvement of lipids.    Lab Results  Component Value Date   CREATININE 0.77 08/12/2019   BUN 14 08/12/2019   NA 137 08/12/2019   K 4.3 08/12/2019   CL 99 08/12/2019   CO2 23 08/12/2019   Lab Results  Component Value Date   CHOL 140 07/09/2018   HDL 51 07/09/2018   LDLCALC 76 07/09/2018   TRIG 65 07/09/2018   CHOLHDL 2.7 07/09/2018   Lab Results  Component Value Date   TSH 4.760 (H) 07/09/2018   Lab Results  Component Value Date   HGBA1C 6.0 (H) 08/12/2019     Review of Systems  Constitutional: Positive for unexpected weight change (has gained some weight on purpose). Negative for chills, fatigue and fever.  HENT: Negative for congestion, hearing loss, tinnitus, trouble swallowing and voice change.   Eyes: Negative for visual disturbance.  Respiratory: Negative for cough, choking, chest tightness, shortness of breath and wheezing.   Cardiovascular: Negative for chest pain, palpitations and leg swelling.  Gastrointestinal: Negative for abdominal pain, constipation, diarrhea, heartburn and vomiting.  Endocrine: Negative for polydipsia and polyuria.  Genitourinary: Negative for dysuria, frequency, genital sores, vaginal bleeding and vaginal discharge.  Musculoskeletal: Negative for arthralgias, gait problem and joint swelling.  Skin:  Negative for color change and rash.  Neurological: Negative for  dizziness, tremors, light-headedness and headaches.  Hematological: Negative for adenopathy. Does not bruise/bleed easily.  Psychiatric/Behavioral: Negative for dysphoric mood and sleep disturbance. The patient is not nervous/anxious.     Patient Active Problem List   Diagnosis Date Noted  . S/P lumbar fusion 05/19/2019  . 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 DDD (degenerative disc disease), lumbar 01/07/2019  . Carotid artery stenosis, asymptomatic, bilateral 07/07/2017  . Primary open angle glaucoma 05/08/2017  . 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 Large hiatal hernia 07/04/2016  . Sigmoid diverticulitis 07/04/2016  . Stress incontinence 07/04/2016  . Type II diabetes mellitus with complication ([place]) [id number]  . Constipation 10/31/2015  . Barrett's esophagus without dysplasia 06/16/2015  . Essential (primary) hypertension 06/16/2015  . Hyperlipidemia associated with type 2 diabetes mellitus ([place]) 06/16/2015  . OP (osteoporosis) 06/16/2015  . Hypercalcemia 04/22/2012    No Known Allergies  Past Surgical History:  Procedure Laterality Date  . ANTERIOR LATERAL LUMBAR FUSION WITH PERCUTANEOUS SCREW 2 LEVEL N/A 05/19/2019   Procedure: L2-4 LATERAL LUMBAR INTERBODY FUSION (XLIF);  Surgeon: Meade Maw, MD;  Location: [facility] ORS;  Service: Neurosurgery;  Laterality: N/A;  . CATARACT EXTRACTION    . CHOLECYSTECTOMY, LAPAROSCOPIC  02/05/2019   [facility]  . COLONOSCOPY  2007   sigmoid diverticulosis  . ENDOSCOPIC RETROGRADE CHOLANGIOPANCREATOGRAPHY (ERCP) WITH PROPOFOL N/A 02/02/2019   Procedure: ENDOSCOPIC RETROGRADE CHOLANGIOPANCREATOGRAPHY (ERCP) WITH PROPOFOL;  Surgeon: Lucilla Lame, MD;  Location: [facility] ENDOSCOPY;  Service: Endoscopy;  Laterality: N/A;  . TONSILLECTOMY    . UPPER GASTROINTESTINAL ENDOSCOPY  02/2013   Barrett's - done at [place] History   Tobacco Use  . Smoking status: Never Smoker  . Smokeless tobacco:  Never Used  . Tobacco comment: smoking cessation materials not required  Substance Use Topics  . Alcohol use: No    Alcohol/week: 0.0 standard drinks  . Drug use: No     Medication list has been reviewed and updated.  Current Meds  Medication Sig  . ASPERCREME LIDOCAINE EX Apply 1 application topically 3 (three) times daily as needed (pain.).  Marland Kitchen aspirin EC 81 MG tablet Take 81 mg by mouth daily.  Marland Kitchen atorvastatin (LIPITOR) 20 MG tablet TAKE 1 TABLET DAILY  . Calcium Carbonate-Vit D-Min (CALCIUM 1200 PO) Take 1 tablet by mouth 3 (three) times a week. At bedtime.  . enalapril (VASOTEC) 20 MG tablet TAKE 1 TABLET DAILY (Patient taking differently: Take 20 mg by mouth daily. )  . esomeprazole (NEXIUM) 40 MG capsule Take 1 capsule by mouth 2 (two) times daily.  Marland Kitchen FREESTYLE LITE test strip USE TO TEST BLOOD SUGAR DAILY  . Inulin (FIBER CHOICE FRUITY BITES) 1.5 g CHEW Chew 1 tablet by mouth 2 (two) times daily.   . Lancets (FREESTYLE) lancets USE 1 LANCET DAILY  . latanoprost (XALATAN) 0.005 % ophthalmic solution Place 1 drop into both eyes at bedtime.  . Menthol, Topical Analgesic, (BIOFREEZE) 10 % LIQD Apply topically. biofreeze  . metFORMIN (GLUCOPHAGE) 500 MG tablet TAKE 1 TABLET DAILY  . Multiple Vitamin (MULTIVITAMIN WITH MINERALS) TABS tablet Take 1 tablet by mouth every evening. Women's Centrum Silver  . naproxen (NAPROSYN) 500 MG tablet Take 500 mg by mouth 2 (two) times daily with a meal.  . polyethylene glycol (MIRALAX / GLYCOLAX) 17 g packet Take 17 g by mouth at bedtime as needed (constipation.).  Marland Kitchen  spironolactone (ALDACTONE) 25 MG tablet TAKE 1 TABLET DAILY  . timolol (TIMOPTIC) 0.5 % ophthalmic solution Place 1 drop into the left eye daily.     PHQ 2/9 Scores 12/10/2019 08/12/2019 07/14/2019 02/15/2019  PHQ - 2 Score 0 0 0 0  PHQ- 9 Score 1 - - 0    BP Readings from Last 3 Encounters:  12/10/19 140/78  08/12/19 118/64  05/21/19 131/66    Physical Exam Vitals and  nursing note reviewed.  Constitutional:      General: She is not in acute distress.    Appearance: Normal appearance. She is well-developed.  HENT:     Head: Normocephalic and atraumatic.     Right Ear: Tympanic membrane and ear canal normal.     Left Ear: Tympanic membrane and ear canal normal.     Nose:     Right Sinus: No maxillary sinus tenderness.     Left Sinus: No maxillary sinus tenderness.  Eyes:     General: No scleral icterus.       Right eye: No discharge.        Left eye: No discharge.     Conjunctiva/sclera: Conjunctivae normal.  Neck:     Thyroid: No thyromegaly.     Vascular: No carotid bruit.  Cardiovascular:     Rate and Rhythm: Normal rate and regular rhythm.     Pulses: Normal pulses.     Heart sounds: Normal heart sounds.  Pulmonary:     Effort: Pulmonary effort is normal. No respiratory distress.     Breath sounds: No wheezing.  Chest:     Breasts:        Right: No mass, nipple discharge, skin change or tenderness.        Left: No mass, nipple discharge, skin change or tenderness.  Abdominal:     General: Bowel sounds are normal.     Palpations: Abdomen is soft. There is mass (on left lateral axillary line).     Tenderness: There is no abdominal tenderness. There is no guarding.     Hernia: No hernia is present.  Musculoskeletal:        General: Normal range of motion.     Cervical back: Normal range of motion. No erythema.     Right lower leg: No edema.     Left lower leg: No edema.  Lymphadenopathy:     Cervical: No cervical adenopathy.  Skin:    General: Skin is warm and dry.     Capillary Refill: Capillary refill takes less than 2 seconds.     Findings: No rash.  Neurological:     General: No focal deficit present.     Mental Status: She is alert and oriented to person, place, and time.     Cranial Nerves: No cranial nerve deficit.     Sensory: No sensory deficit.     Deep Tendon Reflexes: Reflexes are normal and symmetric.  Psychiatric:         Speech: Speech normal.        Behavior: Behavior normal.        Thought Content: Thought content normal.     Wt Readings from Last 3 Encounters:  12/10/19 126 lb (57.2 kg)  08/12/19 115 lb 9.6 oz (52.4 kg)  07/14/19 113 lb (51.3 kg)    BP 140/78   Pulse 62   Temp (!) 97.3 F (36.3 C) (Oral)   Resp 16   Ht 5\' 1"  (1.549 m)   Wt 126  lb (57.2 kg)   SpO2 100%   BMI 23.81 kg/m   Assessment and Plan: 1. Annual physical exam Normal exam for age Routine mammograms and colonoscopies have been discontinued  2. Essential (primary) hypertension Clinically stable exam with well controlled BP on enalapril and spironolactone.. Tolerating medications without side effects at this time. Pt to continue current regimen and low sodium diet; benefits of regular exercise as able discussed. - CBC with Differential/Platelet - POCT urinalysis dipstick  3. Type II diabetes mellitus with complication (HCC) Clinically stable by exam and report without s/s of hypoglycemia. DM complicated by HTN and Lipids. Tolerating medications well without side effects or other concerns. - Comprehensive metabolic panel - Hemoglobin A1c  4. Hyperlipidemia associated with type 2 diabetes mellitus ([place]) Tolerating statin medication without side effects at this time LDL is at goal of < 70 on current dose Continue same therapy without change at this time. - Lipid panel  5. Hypercalcemia Check labs and advise if she should continue supplements This resolved in the past after stopping HCTZ  6. Barrett's esophagus without dysplasia Symptoms well controlled on daily PPI No red flag signs such as weight loss, n/v, melena Will continue PPI and interval EGD. - CBC with Differential/Platelet   Partially dictated using Editor, commissioning. Any errors are unintentional.  Halina Maidens, MD [facility]  12/10/2019

## 2019-12-11 LAB — CBC WITH DIFFERENTIAL/PLATELET
Basophils Absolute: 0.1 10*3/uL (ref 0.0–0.2)
Basos: 1 %
EOS (ABSOLUTE): 0.1 10*3/uL (ref 0.0–0.4)
Eos: 2 %
Hematocrit: 34.5 % (ref 34.0–46.6)
Hemoglobin: 11.3 g/dL (ref 11.1–15.9)
Immature Grans (Abs): 0 10*3/uL (ref 0.0–0.1)
Immature Granulocytes: 0 %
Lymphocytes Absolute: 1.6 10*3/uL (ref 0.7–3.1)
Lymphs: 19 %
MCH: 30.8 pg (ref 26.6–33.0)
MCHC: 32.8 g/dL (ref 31.5–35.7)
MCV: 94 fL (ref 79–97)
Monocytes Absolute: 0.5 10*3/uL (ref 0.1–0.9)
Monocytes: 6 %
Neutrophils Absolute: 5.9 10*3/uL (ref 1.4–7.0)
Neutrophils: 72 %
Platelets: 236 10*3/uL (ref 150–450)
RBC: 3.67 x10E6/uL — ABNORMAL LOW (ref 3.77–5.28)
RDW: 12.8 % (ref 11.7–15.4)
WBC: 8.2 10*3/uL (ref 3.4–10.8)

## 2019-12-11 LAB — COMPREHENSIVE METABOLIC PANEL
ALT: 15 IU/L (ref 0–32)
AST: 23 IU/L (ref 0–40)
Albumin/Globulin Ratio: 2 (ref 1.2–2.2)
Albumin: 4.1 g/dL (ref 3.7–4.7)
Alkaline Phosphatase: 68 IU/L (ref 39–117)
BUN/Creatinine Ratio: 18 (ref 12–28)
BUN: 14 mg/dL (ref 8–27)
Bilirubin Total: 0.4 mg/dL (ref 0.0–1.2)
CO2: 20 mmol/L (ref 20–29)
Calcium: 9.5 mg/dL (ref 8.7–10.3)
Chloride: 99 mmol/L (ref 96–106)
Creatinine, Ser: 0.79 mg/dL (ref 0.57–1.00)
GFR calc Af Amer: 82 mL/min/{1.73_m2} (ref >59)
GFR calc non Af Amer: 71 mL/min/{1.73_m2} (ref >59)
Globulin, Total: 2.1 g/dL (ref 1.5–4.5)
Glucose: 98 mg/dL (ref 65–99)
Potassium: 4.4 mmol/L (ref 3.5–5.2)
Sodium: 134 mmol/L (ref 134–144)
Total Protein: 6.2 g/dL (ref 6.0–8.5)

## 2019-12-11 LAB — LIPID PANEL
Chol/HDL Ratio: 3.1 ratio (ref 0.0–4.4)
Cholesterol, Total: 150 mg/dL (ref 100–199)
HDL: 48 mg/dL (ref >39)
LDL Chol Calc (NIH): 87 mg/dL (ref 0–99)
Triglycerides: 76 mg/dL (ref 0–149)
VLDL Cholesterol Cal: 15 mg/dL (ref 5–40)

## 2019-12-11 LAB — HEMOGLOBIN A1C
Est. average glucose Bld gHb Est-mCnc: 117 mg/dL
Hgb A1c MFr Bld: 5.7 % — ABNORMAL HIGH (ref 4.8–5.6)

## 2019-12-13 ENCOUNTER — Other Ambulatory Visit: Payer: Self-pay

## 2019-12-13 MED ORDER — FLUCONAZOLE 150 MG PO TABS: 150.0000 mg | ORAL_TABLET | Freq: Once | ORAL | 0 refills | Status: AC

## 2020-01-15 ENCOUNTER — Other Ambulatory Visit: Payer: Self-pay | Admitting: Internal Medicine

## 2020-01-26 DIAGNOSIS — Z20822 Contact with and (suspected) exposure to covid-19: Secondary | ICD-10-CM | POA: Diagnosis not present

## 2020-01-26 DIAGNOSIS — Z01812 Encounter for preprocedural laboratory examination: Secondary | ICD-10-CM | POA: Diagnosis not present

## 2020-01-27 DIAGNOSIS — M47816 Spondylosis without myelopathy or radiculopathy, lumbar region: Secondary | ICD-10-CM | POA: Diagnosis not present

## 2020-01-27 DIAGNOSIS — Z981 Arthrodesis status: Secondary | ICD-10-CM | POA: Diagnosis not present

## 2020-01-27 DIAGNOSIS — M5416 Radiculopathy, lumbar region: Secondary | ICD-10-CM | POA: Diagnosis not present

## 2020-01-27 DIAGNOSIS — M419 Scoliosis, unspecified: Secondary | ICD-10-CM | POA: Diagnosis not present

## 2020-01-28 DIAGNOSIS — D649 Anemia, unspecified: Secondary | ICD-10-CM | POA: Diagnosis not present

## 2020-01-28 DIAGNOSIS — Z794 Long term (current) use of insulin: Secondary | ICD-10-CM | POA: Diagnosis not present

## 2020-01-28 DIAGNOSIS — E7800 Pure hypercholesterolemia, unspecified: Secondary | ICD-10-CM | POA: Diagnosis not present

## 2020-01-28 DIAGNOSIS — Z7982 Long term (current) use of aspirin: Secondary | ICD-10-CM | POA: Diagnosis not present

## 2020-01-28 DIAGNOSIS — H02423 Myogenic ptosis of bilateral eyelids: Secondary | ICD-10-CM | POA: Diagnosis not present

## 2020-01-28 DIAGNOSIS — R768 Other specified abnormal immunological findings in serum: Secondary | ICD-10-CM | POA: Diagnosis not present

## 2020-01-28 DIAGNOSIS — Z9849 Cataract extraction status, unspecified eye: Secondary | ICD-10-CM | POA: Diagnosis not present

## 2020-01-28 DIAGNOSIS — I10 Essential (primary) hypertension: Secondary | ICD-10-CM | POA: Diagnosis not present

## 2020-01-28 DIAGNOSIS — K5732 Diverticulitis of large intestine without perforation or abscess without bleeding: Secondary | ICD-10-CM | POA: Diagnosis not present

## 2020-01-28 DIAGNOSIS — K449 Diaphragmatic hernia without obstruction or gangrene: Secondary | ICD-10-CM | POA: Diagnosis not present

## 2020-01-28 DIAGNOSIS — H53413 Scotoma involving central area, bilateral: Secondary | ICD-10-CM | POA: Diagnosis not present

## 2020-01-28 DIAGNOSIS — H409 Unspecified glaucoma: Secondary | ICD-10-CM | POA: Diagnosis not present

## 2020-01-28 DIAGNOSIS — E119 Type 2 diabetes mellitus without complications: Secondary | ICD-10-CM | POA: Diagnosis not present

## 2020-01-28 DIAGNOSIS — K219 Gastro-esophageal reflux disease without esophagitis: Secondary | ICD-10-CM | POA: Diagnosis not present

## 2020-01-28 DIAGNOSIS — E785 Hyperlipidemia, unspecified: Secondary | ICD-10-CM | POA: Diagnosis not present

## 2020-03-14 ENCOUNTER — Encounter: Payer: Self-pay | Admitting: Internal Medicine

## 2020-04-10 ENCOUNTER — Other Ambulatory Visit
Admission: RE | Admit: 2020-04-10 | Discharge: 2020-04-10 | Disposition: A | Discharge status: Home / Self Care | Payer: Medicare Other | Attending: Internal Medicine | Admitting: Internal Medicine

## 2020-04-10 ENCOUNTER — Encounter: Payer: Self-pay | Admitting: Internal Medicine

## 2020-04-10 ENCOUNTER — Other Ambulatory Visit: Payer: Self-pay

## 2020-04-10 ENCOUNTER — Ambulatory Visit (INDEPENDENT_AMBULATORY_CARE_PROVIDER_SITE_OTHER): Payer: Medicare Other | Admitting: Internal Medicine

## 2020-04-10 VITALS — BP 122/82 | HR 78 | Temp 97.7°F | Ht 61.0 in | Wt 131.0 lb

## 2020-04-10 DIAGNOSIS — I10 Essential (primary) hypertension: Secondary | ICD-10-CM

## 2020-04-10 DIAGNOSIS — Z1159 Encounter for screening for other viral diseases: Secondary | ICD-10-CM | POA: Diagnosis not present

## 2020-04-10 DIAGNOSIS — M5136 Other intervertebral disc degeneration, lumbar region: Secondary | ICD-10-CM

## 2020-04-10 DIAGNOSIS — E118 Type 2 diabetes mellitus with unspecified complications: Secondary | ICD-10-CM

## 2020-04-10 LAB — HEMOGLOBIN A1C
Hgb A1c MFr Bld: 5.9 % — ABNORMAL HIGH (ref 4.8–5.6)
Mean Plasma Glucose: 122.63 mg/dL

## 2020-04-10 LAB — HEPATITIS C ANTIBODY: HCV Ab: NONREACTIVE

## 2020-04-10 NOTE — Progress Notes (Signed)
Date:  04/10/2020   Name:  Lori Harrell   DOB:  [birth year]   MRN:  [id number]   Chief Complaint: Hypertension and Diabetes  Diabetes She presents for her follow-up diabetic visit. She has type 2 diabetes mellitus. Her disease course has been stable. Pertinent negatives for hypoglycemia include no headaches or tremors. Pertinent negatives for diabetes include no chest pain, no fatigue, no polydipsia and no polyuria. Current diabetic treatment includes oral agent (monotherapy) (metformin reduced to 250 mg per day). She monitors blood glucose at home 3-4 x per week. There is no change in her home blood glucose trend. Her breakfast blood glucose is taken between 6-7 am. Her breakfast blood glucose range is generally 90-110 mg/dl. An ACE inhibitor/angiotensin II receptor blocker is being taken.  Hypertension This is a chronic problem. The problem is controlled. Pertinent negatives include no chest pain, headaches, palpitations or shortness of breath. Past treatments include ACE inhibitors and diuretics. The current treatment provides significant improvement.  Hyperlipidemia The problem is controlled. Pertinent negatives include no chest pain or shortness of breath. Current antihyperlipidemic treatment includes statins. The current treatment provides significant improvement of lipids.    Lab Results  Component Value Date   CREATININE 0.79 12/10/2019   BUN 14 12/10/2019   NA 134 12/10/2019   K 4.4 12/10/2019   CL 99 12/10/2019   CO2 20 12/10/2019   Lab Results  Component Value Date   CHOL 150 12/10/2019   HDL 48 12/10/2019   LDLCALC 87 12/10/2019   TRIG 76 12/10/2019   CHOLHDL 3.1 12/10/2019   Lab Results  Component Value Date   TSH 4.760 (H) 07/09/2018   Lab Results  Component Value Date   HGBA1C 5.7 (H) 12/10/2019   Lab Results  Component Value Date   WBC 8.2 12/10/2019   HGB 11.3 12/10/2019   HCT 34.5 12/10/2019   MCV 94 12/10/2019   PLT 236 12/10/2019   Lab  Results  Component Value Date   ALT 15 12/10/2019   AST 23 12/10/2019   ALKPHOS 68 12/10/2019   BILITOT 0.4 12/10/2019     Review of Systems  Constitutional: Negative for appetite change, fatigue, fever and unexpected weight change.  HENT: Negative for tinnitus and trouble swallowing.   Eyes: Negative for visual disturbance.  Respiratory: Negative for cough, chest tightness and shortness of breath.   Cardiovascular: Negative for chest pain, palpitations and leg swelling.  Gastrointestinal: Negative for abdominal pain.  Endocrine: Negative for polydipsia and polyuria.  Genitourinary: Negative for dysuria and hematuria.  Musculoskeletal: Positive for back pain and gait problem. Negative for arthralgias.  Neurological: Negative for tremors, numbness and headaches.  Psychiatric/Behavioral: Negative for dysphoric mood.    Patient Active Problem List   Diagnosis Date Noted  . S/P lumbar fusion 05/19/2019  . 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 DDD (degenerative disc disease), lumbar 01/07/2019  . Carotid artery stenosis, asymptomatic, bilateral 07/07/2017  . Primary open angle glaucoma 05/08/2017  . 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 Large hiatal hernia 07/04/2016  . Sigmoid diverticulitis 07/04/2016  . Stress incontinence 07/04/2016  . Type II diabetes mellitus with complication ([place]) 87/56/4332  . Constipation 10/31/2015  . Barrett's esophagus without dysplasia 06/16/2015  . Essential (primary) hypertension 06/16/2015  . Hyperlipidemia associated with type 2 diabetes mellitus ([place]) 06/16/2015  . OP (osteoporosis) 06/16/2015  . Hypercalcemia 04/22/2012    No Known Allergies  Past Surgical History:  Procedure Laterality Date  . ANTERIOR  LATERAL LUMBAR FUSION WITH PERCUTANEOUS SCREW 2 LEVEL N/A 05/19/2019   Procedure: L2-4 LATERAL LUMBAR INTERBODY FUSION (XLIF);  Surgeon: Meade Maw, MD;  Location: [facility] ORS;  Service: Neurosurgery;  Laterality: N/A;   . CATARACT EXTRACTION    . CHOLECYSTECTOMY, LAPAROSCOPIC  02/05/2019   [facility]  . COLONOSCOPY  2007   sigmoid diverticulosis  . ENDOSCOPIC RETROGRADE CHOLANGIOPANCREATOGRAPHY (ERCP) WITH PROPOFOL N/A 02/02/2019   Procedure: ENDOSCOPIC RETROGRADE CHOLANGIOPANCREATOGRAPHY (ERCP) WITH PROPOFOL;  Surgeon: Lucilla Lame, MD;  Location: [facility] END[facility]COPY;  Service: Endoscopy;  Laterality: N/A;  . TONSILLECTOMY    . UPPER GASTROINTESTINAL ENDOSCOPY  02/2013   Barrett's - done at [place] History   Tobacco Use  . Smoking status: Never Smoker  . Smokeless tobacco: Never Used  . Tobacco comment: smoking cessation materials not required  Vaping Use  . Vaping Use: Never used  Substance Use Topics  . Alcohol use: No    Alcohol/week: 0.0 standard drinks  . Drug use: No     Medication list has been reviewed and updated.  Current Meds  Medication Sig  . ASPERCREME LIDOCAINE EX Apply 1 application topically 3 (three) times daily as needed (pain.).  Marland Kitchen aspirin EC 81 MG tablet Take 81 mg by mouth daily.  Marland Kitchen atorvastatin (LIPITOR) 20 MG tablet TAKE 1 TABLET DAILY  . Calcium Carbonate-Vit D-Min (CALCIUM 1200 PO) Take 1 tablet by mouth 3 (three) times a week. At bedtime.  . dorzolamide (TRUSOPT) 2 % ophthalmic solution 1 drop 3 (three) times daily.  . enalapril (VASOTEC) 20 MG tablet Take 1 tablet (20 mg total) by mouth daily.  Marland Kitchen esomeprazole (NEXIUM) 40 MG capsule Take 1 capsule by mouth 2 (two) times daily.  Marland Kitchen FREESTYLE LITE test strip USE TO TEST BLOOD SUGAR DAILY  . Inulin (FIBER CHOICE FRUITY BITES) 1.5 g CHEW Chew 1 tablet by mouth 2 (two) times daily.   . Lancets (FREESTYLE) lancets USE 1 LANCET DAILY  . latanoprost (XALATAN) 0.005 % ophthalmic solution Place 1 drop into both eyes at bedtime.  . Menthol, Topical Analgesic, (BIOFREEZE) 10 % LIQD Apply topically. biofreeze  . metFORMIN (GLUCOPHAGE) 500 MG tablet TAKE 1 TABLET DAILY (Patient taking differently: Take  250 mg by mouth daily with breakfast. )  . Multiple Vitamin (MULTIVITAMIN WITH MINERALS) TABS tablet Take 1 tablet by mouth every evening. Women's Centrum Silver  . naproxen (NAPROSYN) 500 MG tablet Take 500 mg by mouth 2 (two) times daily with a meal.  . polyethylene glycol (MIRALAX / GLYCOLAX) 17 g packet Take 17 g by mouth at bedtime as needed (constipation.).  Marland Kitchen spironolactone (ALDACTONE) 25 MG tablet TAKE 1 TABLET DAILY  . timolol (TIMOPTIC) 0.5 % ophthalmic solution Place 1 drop into the left eye daily.     PHQ 2/9 Scores 04/10/2020 12/10/2019 08/12/2019 07/14/2019  PHQ - 2 Score 0 0 0 0  PHQ- 9 Score 0 1 - -    GAD 7 : Generalized Anxiety Score 04/10/2020  Nervous, Anxious, on Edge 0  Control/stop worrying 0  Worry too much - different things 0  Trouble relaxing 0  Restless 0  Easily annoyed or irritable 0  Afraid - awful might happen 0  Total GAD 7 Score 0  Anxiety Difficulty Not difficult at all    BP Readings from Last 3 Encounters:  04/10/20 122/82  12/10/19 140/78  08/12/19 118/64    Physical Exam Vitals and nursing note reviewed.  Constitutional:  General: She is not in acute distress.    Appearance: She is well-developed.  HENT:     Head: Normocephalic and atraumatic.  Cardiovascular:     Rate and Rhythm: Normal rate and regular rhythm.  Pulmonary:     Effort: Pulmonary effort is normal. No respiratory distress.     Breath sounds: Normal breath sounds. No wheezing or rhonchi.  Musculoskeletal:     Cervical back: Normal range of motion.     Right lower leg: No edema.     Left lower leg: No edema.  Lymphadenopathy:     Cervical: No cervical adenopathy.  Skin:    General: Skin is warm and dry.     Findings: No rash.  Neurological:     Mental Status: She is alert and oriented to person, place, and time.     Motor: No weakness.     Gait: Gait abnormal.  Psychiatric:        Mood and Affect: Mood normal.     Wt Readings from Last 3 Encounters:    04/10/20 131 lb (59.4 kg)  12/10/19 126 lb (57.2 kg)  08/12/19 115 lb 9.6 oz (52.4 kg)    BP 122/82 (BP Location: Right Arm, Patient Position: Sitting, Cuff Size: Normal)   Pulse 78   Temp 97.7 F (36.5 C) (Oral)   Ht 5\' 1"  (1.549 m)   Wt 131 lb (59.4 kg)   SpO2 100%   BMI 24.75 kg/m   Assessment and Plan: 1. Type II diabetes mellitus with complication (HCC) Clinically stable by exam and report without s/s of hypoglycemia. DM complicated by htn. Tolerating medications - metformin 250 mg daily -  well without side effects or other concerns. - Hemoglobin A1c  2. Essential (primary) hypertension Clinically stable exam with well controlled BP. Tolerating medications without side effects at this time. Pt to continue current regimen and low sodium diet; benefits of regular exercise as able discussed. - TSH + free T4  3. Need for hepatitis C screening test - Hepatitis C antibody  4. DDD (degenerative disc disease), lumbar Handicapped parking application given   Partially dictated using Editor, commissioning. Any errors are unintentional.  Halina Maidens, MD [facility]  04/10/2020    Partially dictated using Dragon software. Any errors are unintentional.  Halina Maidens, MD [facility]  04/10/2020

## 2020-04-12 ENCOUNTER — Telehealth: Payer: Self-pay

## 2020-04-12 NOTE — Telephone Encounter (Signed)
Katharine Look from the hospital lab downstairs called to say she did not see the TSH and T4 lab when she did labs on the patient so it was not completed. She said they had a large flow of patients coming down yesterday from [facility] and that lab was missed. Told her I would document and let the doctor know.  FYI.

## 2020-04-12 NOTE — Telephone Encounter (Signed)
The lab was not crucial.  Let the patient know.  We can draw it next time.

## 2020-05-09 ENCOUNTER — Encounter: Payer: Self-pay | Admitting: Internal Medicine

## 2020-05-09 DIAGNOSIS — Z961 Presence of intraocular lens: Secondary | ICD-10-CM | POA: Diagnosis not present

## 2020-05-09 DIAGNOSIS — H401113 Primary open-angle glaucoma, right eye, severe stage: Secondary | ICD-10-CM | POA: Diagnosis not present

## 2020-05-09 DIAGNOSIS — E119 Type 2 diabetes mellitus without complications: Secondary | ICD-10-CM | POA: Diagnosis not present

## 2020-05-09 DIAGNOSIS — H401121 Primary open-angle glaucoma, left eye, mild stage: Secondary | ICD-10-CM | POA: Diagnosis not present

## 2020-05-09 DIAGNOSIS — H338 Other retinal detachments: Secondary | ICD-10-CM | POA: Diagnosis not present

## 2020-05-09 LAB — HM DIABETES EYE EXAM

## 2020-05-16 DIAGNOSIS — L821 Other seborrheic keratosis: Secondary | ICD-10-CM | POA: Diagnosis not present

## 2020-05-16 DIAGNOSIS — D225 Melanocytic nevi of trunk: Secondary | ICD-10-CM | POA: Diagnosis not present

## 2020-05-16 DIAGNOSIS — D2261 Melanocytic nevi of right upper limb, including shoulder: Secondary | ICD-10-CM | POA: Diagnosis not present

## 2020-05-16 DIAGNOSIS — D2262 Melanocytic nevi of left upper limb, including shoulder: Secondary | ICD-10-CM | POA: Diagnosis not present

## 2020-05-16 DIAGNOSIS — D2272 Melanocytic nevi of left lower limb, including hip: Secondary | ICD-10-CM | POA: Diagnosis not present

## 2020-05-16 DIAGNOSIS — D2271 Melanocytic nevi of right lower limb, including hip: Secondary | ICD-10-CM | POA: Diagnosis not present

## 2020-06-10 ENCOUNTER — Other Ambulatory Visit: Payer: Self-pay | Admitting: Internal Medicine

## 2020-06-20 ENCOUNTER — Telehealth: Payer: Self-pay

## 2020-06-20 NOTE — Chronic Care Management (AMB) (Signed)
  Chronic Care Management   Note  06/20/2020 Name: Lori Harrell MRN: [id number] DOB: Jan 22, 1941  Lori Harrell is a 79 y.o. year old female who is a primary care patient of Glean Hess, MD. I reached out to [name] by phone today in response to a referral sent by Lori Harrell's health plan.     Ms. Heuerman was given information about Chronic Care Management services today including:  1. CCM service includes personalized support from designated clinical staff supervised by her physician, including individualized plan of care and coordination with other care providers 2. 24/7 contact phone numbers for assistance for urgent and routine care needs. 3. Service will only be billed when office clinical staff spend 20 minutes or more in a month to coordinate care. 4. Only one practitioner may furnish and bill the service in a calendar month. 5. The patient may stop CCM services at any time (effective at the end of the month) by phone call to the office staff. 6. The patient will be responsible for cost sharing (co-pay) of up to 20% of the service fee (after annual deductible is met).  Patient did not agree to enrollment in care management services and does not wish to consider at this time.  Follow up plan: The patient has been provided with contact information for the care management team and has been advised to call with any health related questions or concerns.   Noreene Larsson, [address], [place], [place] [address] Direct Dial: [phone] [email or web address]: [email or web address]

## 2020-07-17 ENCOUNTER — Ambulatory Visit (INDEPENDENT_AMBULATORY_CARE_PROVIDER_SITE_OTHER): Payer: Medicare Other

## 2020-07-17 DIAGNOSIS — Z0000 Encounter for general adult medical examination without abnormal findings: Secondary | ICD-10-CM | POA: Diagnosis not present

## 2020-07-17 NOTE — Progress Notes (Signed)
Subjective:   Lori Harrell is a 79 y.o. female who presents for Medicare Annual (Subsequent) preventive examination.  Virtual Visit via Telephone Note  I connected with  Lori Harrell on 07/17/20 at  8:40 AM EDT by telephone and verified that I am speaking with the correct person using two identifiers.  Medicare Annual Wellness visit completed telephonically due to Covid-19 pandemic.   Location: Patient: home Provider: [facility]   I discussed the limitations, risks, security and privacy concerns of performing an evaluation and management service by telephone and the availability of in person appointments. The patient expressed understanding and agreed to proceed.  Unable to perform video visit due to video visit attempted and failed and/or patient does not have video capability.   Some vital signs may be absent or patient reported.   Clemetine Marker, LPN    Review of Systems     Cardiac Risk Factors include: advanced age (>59men, >74 women);diabetes mellitus;dyslipidemia;hypertension     Objective:    There were no vitals filed for this visit. There is no height or weight on file to calculate BMI.  Advanced Directives 07/17/2020 07/14/2019 05/19/2019 05/10/2019 02/01/2019 02/01/2019 02/01/2019  Does Patient Have a Medical Advance Directive? Yes Yes Yes Yes Yes No No  Type of Paramedic of [place];Living will [place];Living will [place];Living will [place];Living will [place];Living will - -  Does patient want to make changes to medical advance directive? - - No - Patient declined - No - Patient declined - -  Copy of [place] in Chart? Yes - validated most recent copy scanned in chart (See row information) Yes - validated most recent copy scanned in chart (See row information) No - copy requested - No - copy requested - -  Would patient like  information on creating a medical advance directive? - - - - - - -    Current Medications (verified) Outpatient Encounter Medications as of 07/17/2020  Medication Sig  . ASPERCREME LIDOCAINE EX Apply 1 application topically 3 (three) times daily as needed (pain.).  Marland Kitchen aspirin EC 81 MG tablet Take 81 mg by mouth daily.  Marland Kitchen atorvastatin (LIPITOR) 20 MG tablet TAKE 1 TABLET DAILY  . Calcium Carbonate-Vit D-Min (CALCIUM 1200 PO) Take 1 tablet by mouth 3 (three) times a week. At bedtime.  . dorzolamide (TRUSOPT) 2 % ophthalmic solution 1 drop 3 (three) times daily.  . enalapril (VASOTEC) 20 MG tablet Take 1 tablet (20 mg total) by mouth daily.  Marland Kitchen FREESTYLE LITE test strip USE TO TEST BLOOD SUGAR DAILY  . Inulin (FIBER CHOICE FRUITY BITES) 1.5 g CHEW Chew 1 tablet by mouth 2 (two) times daily.   . Lancets (FREESTYLE) lancets USE 1 LANCET DAILY  . latanoprost (XALATAN) 0.005 % ophthalmic solution Place 1 drop into both eyes at bedtime.  . Menthol, Topical Analgesic, (BIOFREEZE) 10 % LIQD Apply topically. biofreeze  . metFORMIN (GLUCOPHAGE) 500 MG tablet TAKE 1 TABLET DAILY (Patient taking differently: Take 250 mg by mouth daily with breakfast. )  . Multiple Vitamin (MULTIVITAMIN WITH MINERALS) TABS tablet Take 1 tablet by mouth every evening. Women's Centrum Silver  . naproxen (NAPROSYN) 500 MG tablet Take 500 mg by mouth 2 (two) times daily with a meal.  . polyethylene glycol (MIRALAX / GLYCOLAX) 17 g packet Take 17 g by mouth at bedtime as needed (constipation.).  Marland Kitchen spironolactone (ALDACTONE) 25 MG tablet TAKE 1  TABLET DAILY  . timolol (TIMOPTIC) 0.5 % ophthalmic solution Place 1 drop into the left eye daily.   . [EXPIRED] dorzolamide-timolol (COSOPT) 22.3-6.8 MG/ML ophthalmic solution Place 1 drop into the right eye 2 (two) times daily.   . [EXPIRED] esomeprazole (NEXIUM) 40 MG capsule Take 1 capsule by mouth 2 (two) times daily.  . [DISCONTINUED] atorvastatin (LIPITOR) 20 MG tablet TAKE 1 TABLET  DAILY (Patient taking differently: Take 20 mg by mouth every evening. )  . [DISCONTINUED] enalapril (VASOTEC) 20 MG tablet TAKE 1 TABLET DAILY (Patient taking differently: Take 20 mg by mouth daily. )  . [DISCONTINUED] FREESTYLE LITE test strip USE TO TEST BLOOD SUGAR DAILY  . [DISCONTINUED] Lancets (FREESTYLE) lancets USE 1 LANCET DAILY  . [DISCONTINUED] metFORMIN (GLUCOPHAGE) 500 MG tablet TAKE 1 TABLET DAILY (Patient taking differently: Take 500 mg by mouth daily. )  . [DISCONTINUED] spironolactone (ALDACTONE) 25 MG tablet TAKE 1 TABLET DAILY (Patient taking differently: Take 25 mg by mouth daily. )   No facility-administered encounter medications on file as of 07/17/2020.    Allergies (verified) Patient has no known allergies.   History: Past Medical History:  Diagnosis Date  . Acid reflux   . Acute cholangitis 02/01/2019  . 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 Choledocholithiasis   . Diabetes mellitus without complication ([place])   . Elevated lipids   . Glaucoma   . History of detached retina repair   . Hypertension   . Unstageable pressure ulcer of back ([place]) 01/07/2019   Past Surgical History:  Procedure Laterality Date  . ANTERIOR LATERAL LUMBAR FUSION WITH PERCUTANEOUS SCREW 2 LEVEL N/A 05/19/2019   Procedure: L2-4 LATERAL LUMBAR INTERBODY FUSION (XLIF);  Surgeon: Meade Maw, MD;  Location: [facility] ORS;  Service: Neurosurgery;  Laterality: N/A;  . CATARACT EXTRACTION    . CHOLECYSTECTOMY, LAPAROSCOPIC  02/05/2019   [facility]  . COLONOSCOPY  2007   sigmoid diverticulosis  . ENDOSCOPIC RETROGRADE CHOLANGIOPANCREATOGRAPHY (ERCP) WITH PROPOFOL N/A 02/02/2019   Procedure: ENDOSCOPIC RETROGRADE CHOLANGIOPANCREATOGRAPHY (ERCP) WITH PROPOFOL;  Surgeon: Lucilla Lame, MD;  Location: [facility] ENDOSCOPY;  Service: Endoscopy;  Laterality: N/A;  . TONSILLECTOMY    . UPPER GASTROINTESTINAL ENDOSCOPY  02/2013   [facility] - done at [facility]  . VAGINAL HYSTERECTOMY     Family  History  Problem Relation Age of Onset  . Diabetes Father   . CAD Father   . Breast cancer Neg Hx    Social History   Socioeconomic History  . Marital status: Widowed    Spouse name: Not on file  . Number of children: 2  . Years of education: Not on file  . Highest education level: 12th grade  Occupational History  . Occupation: Retired  Tobacco Use  . Smoking status: Never Smoker  . Smokeless tobacco: Never Used  . Tobacco comment: smoking cessation materials not required  Vaping Use  . Vaping Use: Never used  Substance and Sexual Activity  . Alcohol use: No    Alcohol/week: 0.0 standard drinks  . Drug use: No  . Sexual activity: Not Currently  Other Topics Concern  . Not on file  Social History Narrative  . Not on file   Social Determinants of Health   Financial Resource Strain: Low Risk   . Difficulty of Paying Living Expenses: Not very hard  Food Insecurity: No Food Insecurity  . Worried About Charity fundraiser in the Last Year: Never true  . Ran Out of Food in the Last  Year: Never true  Transportation Needs: No Transportation Needs  . Lack of Transportation (Medical): No  . Lack of Transportation (Non-Medical): No  Physical Activity: Sufficiently Active  . Days of Exercise per Week: 5 days  . Minutes of Exercise per Session: 30 min  Stress: No Stress Concern Present  . Feeling of Stress : Not at all  Social Connections: Moderately Isolated  . Frequency of Communication with Friends and Family: More than three times a week  . Frequency of Social Gatherings with Friends and Family: More than three times a week  . Attends Religious Services: More than 4 times per year  . Active Member of Clubs or Organizations: No  . Attends Archivist Meetings: Never  . Marital Status: Widowed    Tobacco Counseling Counseling given: Not Answered Comment: smoking cessation materials not required   Clinical Intake:  Pre-visit preparation completed:  Yes  Pain : No/denies pain     Nutritional Risks: None Diabetes: Yes CBG done?: No Did pt. bring in CBG monitor from home?: No  How often do you need to have someone help you when you read instructions, pamphlets, or other written materials from your doctor or pharmacy?: 1 - Never  Nutrition Risk Assessment:  Has the patient had any N/V/D within the last 2 months?  No  Does the patient have any non-healing wounds?  No  Has the patient had any unintentional weight loss or weight gain?  No   Diabetes:  Is the patient diabetic?  Yes  If diabetic, was a CBG obtained today?  No  Did the patient bring in their glucometer from home?  No  How often do you monitor your CBG's? daily.   Financial Strains and Diabetes Management:  Are you having any financial strains with the device, your supplies or your medication? No .  Does the patient want to be seen by Chronic Care Management for management of their diabetes?  No  Would the patient like to be referred to a Nutritionist or for Diabetic Management?  No   Diabetic Exams:  Diabetic Eye Exam: Completed 05/09/20 negative retinopathy.  Diabetic Foot Exam: Completed 12/10/19.   Interpreter Needed?: No  Information entered by :: Clemetine Marker LPN   Activities of Daily Living In your present state of health, do you have any difficulty performing the following activities: 07/17/2020  Hearing? N  Comment declines hearing aids  Vision? N  Difficulty concentrating or making decisions? N  Walking or climbing stairs? Y  Dressing or bathing? N  Doing errands, shopping? N  Preparing Food and eating ? N  Using the Toilet? N  In the past six months, have you accidently leaked urine? N  Do you have problems with loss of bowel control? N  Managing your Medications? N  Managing your Finances? N  Housekeeping or managing your Housekeeping? N  Some recent data might be hidden    Patient Care Team: Glean Hess, MD as PCP - General  (Internal Medicine) Florinda Marker Early Chars, MD as Consulting Physician (Ophthalmology) Malissa Hippo, Gaspar Skeeters, MD as Consulting Physician (Gastroenterology) Lovell Sheehan, MD as Consulting Physician (Orthopedic Surgery) Meade Maw, MD as Consulting Physician (Neurosurgery)  Indicate any recent [place] you may have received from other than [facility] providers in the past year (date may be approximate).     Assessment:   This is a routine wellness examination for Lori Harrell.  Hearing/Vision screen  Hearing Screening   125Hz  250Hz  500Hz  1000Hz  2000Hz  3000Hz  4000Hz   6000Hz  8000Hz   Right ear:           Left ear:           Comments: Pt denies hearing difficulty  Vision Screening Comments: Annual vision screenings done by Dr. Guerry Bruin at [facility] in [place]  Dietary issues and exercise activities discussed: Current Exercise Habits: Home exercise routine, Type of exercise: walking, Time (Minutes): 30, Frequency (Times/Week): 5, Weekly Exercise (Minutes/Week): 150, Intensity: Mild, Exercise limited by: orthopedic condition(s)  Goals    . continue activity     Taking a class in [place] in [place] with church. She also walks 2-3 days a week 30 min and helps at church every Wed night.    Marland Kitchen DIET - EAT MORE FRUITS AND VEGETABLES     Recommend to eat 3 small healthy meals and at least 2 healthy snacks per day.     . Increase water intake     Recommend to increase fluid intake from 3-4 glasses per day to 4-6 glasses per day.      Depression Screen PHQ 2/9 Scores 07/17/2020 04/10/2020 12/10/2019 08/12/2019 07/14/2019 02/15/2019 01/07/2019  PHQ - 2 Score 0 0 0 0 0 0 4  PHQ- 9 Score - 0 1 - - 0 18    Fall Risk Fall Risk  07/17/2020 04/10/2020 12/10/2019 08/12/2019 07/14/2019  Falls in the past year? 0 0 1 0 0  Comment - - - - -  Number falls in past yr: 0 0 0 0 0  Injury with Fall? 0 0 0 0 0  Risk for fall due to : Impaired balance/gait No Fall Risks;History of fall(s);Impaired  balance/gait Other (Comment) - -  Risk for fall due to: Comment - - - - -  Follow up Falls prevention discussed Falls evaluation completed Falls evaluation completed Falls evaluation completed Falls prevention discussed    Any stairs in or around the home? No  If so, are there any without handrails? No  Home free of loose throw rugs in walkways, pet beds, electrical cords, etc? Yes  Adequate lighting in your home to reduce risk of falls? Yes   ASSISTIVE DEVICES UTILIZED TO PREVENT FALLS:  Life alert? No  Use of a cane, walker or w/c? Yes  Grab bars in the bathroom? Yes  Shower chair or bench in shower? Yes  Elevated toilet seat or a handicapped toilet? Yes   TIMED UP AND GO:  Was the test performed? No . Telephonic visit.   Cognitive Function:     6CIT Screen 07/17/2020 07/14/2019 07/08/2018 07/07/2017 07/04/2016  What Year? 0 points 0 points 0 points 0 points 0 points  What month? 0 points 0 points 0 points 0 points 0 points  What time? 0 points 0 points 0 points 0 points 0 points  Count back from 20 0 points 0 points 0 points 0 points 0 points  Months in reverse 0 points 0 points 0 points 0 points 0 points  Repeat phrase 0 points 2 points 0 points 0 points 0 points  Total Score 0 2 0 0 0    Immunizations Immunization History  Administered Date(s) Administered  . Fluad Quad(high Dose 65+) 08/12/2019  . Influenza, High Dose Seasonal PF 07/07/2017, 07/08/2018  . Influenza, Seasonal, Injecte, Preservative Fre 08/19/2013  . Influenza,inj,Quad PF,6+ Mos 06/23/2015, 07/04/2016  . Influenza-Unspecified 06/18/2011, 08/21/2012  . PFIZER SARS-COV-2 Vaccination 12/24/2019, 01/14/2020  . Pneumococcal Conjugate-13 06/20/2014  . Pneumococcal Polysaccharide-23 10/02/2007  . Tdap 10/01/2010  TDAP status: Up to date   Flu vaccine status: due for 2021-2022 season  Pneumococcal vaccine status: Up to date   Covid-19 vaccine status: Completed vaccines  Qualifies for Shingles  Vaccine? Yes   Zostavax completed No   Shingrix Completed?: No.    Education has been provided regarding the importance of this vaccine. Patient has been advised to call insurance company to determine out of pocket expense if they have not yet received this vaccine. Advised may also receive vaccine at local pharmacy or Health Dept. Verbalized acceptance and understanding.  Screening Tests Health Maintenance  Topic Date Due  . INFLUENZA VACCINE  04/30/2020  . TETANUS/TDAP  10/01/2020  . HEMOGLOBIN A1C  10/11/2020  . FOOT EXAM  12/09/2020  . OPHTHALMOLOGY EXAM  05/09/2021  . DEXA SCAN  Completed  . COVID-19 Vaccine  Completed  . PNA vac Low Risk Adult  Completed    Health Maintenance  Health Maintenance Due  Topic Date Due  . INFLUENZA VACCINE  04/30/2020    Colorectal cancer screening: No longer required.    Mammogram status: No longer required.    Bone Density status: Completed 07/21/18. Results reflect: Bone density results: OSTEOPOROSIS. Repeat every 2 years.  Lung Cancer Screening: (Low Dose CT Chest recommended if Age 9-80 years, 30 pack-year currently smoking OR have quit w/in 15years.) does not qualify.   Additional Screening:  Hepatitis C Screening: does not qualify;  Vision Screening: Recommended annual ophthalmology exams for early detection of glaucoma and other disorders of the eye. Is the patient up to date with their annual eye exam?  Yes  Who is the provider or what is the name of the office in which the patient attends annual eye exams? [facility]  Dental Screening: Recommended annual dental exams for proper oral hygiene  Community Resource Referral / Chronic Care Management: CRR required this visit?  No   CCM required this visit?  No      Plan:     I have personally reviewed and noted the following in the patient's chart:   . Medical and social history . Use of alcohol, tobacco or illicit drugs  . Current medications and supplements . Functional  ability and status . Nutritional status . Physical activity . Advanced directives . List of other physicians . Hospitalizations, surgeries, and ER visits in previous 12 months . Vitals . Screenings to include cognitive, depression, and falls . Referrals and appointments  In addition, I have reviewed and discussed with patient certain preventive protocols, quality metrics, and best practice recommendations. A written personalized care plan for preventive services as well as general preventive health recommendations were provided to patient.     Clemetine Marker, LPN   [id number]   Nurse Notes: pt doing well and appreciative of visit today.

## 2020-07-17 NOTE — Patient Instructions (Signed)
Lori Harrell , Thank you for taking time to come for your Medicare Wellness Visit. I appreciate your ongoing commitment to your health goals. Please review the following plan we discussed and let me know if I can assist you in the future.   Screening recommendations/referrals: Colonoscopy: no longer required Mammogram: no longer required Bone Density: no longer required Recommended yearly ophthalmology/optometry visit for glaucoma screening and checkup Recommended yearly dental visit for hygiene and checkup  Vaccinations: Influenza vaccine: due Pneumococcal vaccine: done 06/20/14 Tdap vaccine: done 2012 Shingles vaccine: Shingrix discussed. Please contact your pharmacy for coverage information.  Covid-19: done 12/24/19 & 01/14/20  Conditions/risks identified: Keep up the great work!  Next appointment: Follow up in one year for your annual wellness visit    Preventive Care 65 Years and Older, Female Preventive care refers to lifestyle choices and visits with your health care provider that can promote health and wellness. What does preventive care include?  A yearly physical exam. This is also called an annual well check.  Dental exams once or twice a year.  Routine eye exams. Ask your health care provider how often you should have your eyes checked.  Personal lifestyle choices, including:  Daily care of your teeth and gums.  Regular physical activity.  Eating a healthy diet.  Avoiding tobacco and drug use.  Limiting alcohol use.  Practicing safe sex.  Taking low-dose aspirin every day.  Taking vitamin and mineral supplements as recommended by your health care provider. What happens during an annual well check? The services and screenings done by your health care provider during your annual well check will depend on your age, overall health, lifestyle risk factors, and family history of disease. Counseling  Your health care provider may ask you questions about  your:  Alcohol use.  Tobacco use.  Drug use.  Emotional well-being.  Home and relationship well-being.  Sexual activity.  Eating habits.  History of falls.  Memory and ability to understand (cognition).  Work and work Statistician.  Reproductive health. Screening  You may have the following tests or measurements:  Height, weight, and BMI.  Blood pressure.  Lipid and cholesterol levels. These may be checked every 5 years, or more frequently if you are over 52 years old.  Skin check.  Lung cancer screening. You may have this screening every year starting at age 106 if you have a 30-pack-year history of smoking and currently smoke or have quit within the past 15 years.  Fecal occult blood test (FOBT) of the stool. You may have this test every year starting at age 37.  Flexible sigmoidoscopy or colonoscopy. You may have a sigmoidoscopy every 5 years or a colonoscopy every 10 years starting at age 8.  Hepatitis C blood test.  Hepatitis B blood test.  Sexually transmitted disease (STD) testing.  Diabetes screening. This is done by checking your blood sugar (glucose) after you have not eaten for a while (fasting). You may have this done every 1-3 years.  Bone density scan. This is done to screen for osteoporosis. You may have this done starting at age 79.  Mammogram. This may be done every 1-2 years. Talk to your health care provider about how often you should have regular mammograms. Talk with your health care provider about your test results, treatment options, and if necessary, the need for more tests. Vaccines  Your health care provider may recommend certain vaccines, such as:  Influenza vaccine. This is recommended every year.  Tetanus, diphtheria, and acellular  pertussis (Tdap, Td) vaccine. You may need a Td booster every 10 years.  Zoster vaccine. You may need this after age 60.  Pneumococcal 13-valent conjugate (PCV13) vaccine. One dose is recommended  after age 73.  Pneumococcal polysaccharide (PPSV23) vaccine. One dose is recommended after age 65. Talk to your health care provider about which screenings and vaccines you need and how often you need them. This information is not intended to replace advice given to you by your health care provider. Make sure you discuss any questions you have with your health care provider. Document Released: 10/13/2015 Document Revised: 06/05/2016 Document Reviewed: 07/18/2015 Elsevier Interactive Patient Education  2017 [place] Prevention in the Home Falls can cause injuries. They can happen to people of all ages. There are many things you can do to make your home safe and to help prevent falls. What can I do on the outside of my home?  Regularly fix the edges of walkways and driveways and fix any cracks.  Remove anything that might make you trip as you walk through a door, such as a raised step or threshold.  Trim any bushes or trees on the path to your home.  Use bright outdoor lighting.  Clear any walking paths of anything that might make someone trip, such as rocks or tools.  Regularly check to see if handrails are loose or broken. Make sure that both sides of any steps have handrails.  Any raised decks and porches should have guardrails on the edges.  Have any leaves, snow, or ice cleared regularly.  Use sand or salt on walking paths during winter.  Clean up any spills in your garage right away. This includes oil or grease spills. What can I do in the bathroom?  Use night lights.  Install grab bars by the toilet and in the tub and shower. Do not use towel bars as grab bars.  Use non-skid mats or decals in the tub or shower.  If you need to sit down in the shower, use a plastic, non-slip stool.  Keep the floor dry. Clean up any water that spills on the floor as soon as it happens.  Remove soap buildup in the tub or shower regularly.  Attach bath mats securely with  double-sided non-slip rug tape.  Do not have throw rugs and other things on the floor that can make you trip. What can I do in the bedroom?  Use night lights.  Make sure that you have a light by your bed that is easy to reach.  Do not use any sheets or blankets that are too big for your bed. They should not hang down onto the floor.  Have a firm chair that has side arms. You can use this for support while you get dressed.  Do not have throw rugs and other things on the floor that can make you trip. What can I do in the kitchen?  Clean up any spills right away.  Avoid walking on wet floors.  Keep items that you use a lot in easy-to-reach places.  If you need to reach something above you, use a strong step stool that has a grab bar.  Keep electrical cords out of the way.  Do not use floor polish or wax that makes floors slippery. If you must use wax, use non-skid floor wax.  Do not have throw rugs and other things on the floor that can make you trip. What can I do with my stairs?  Do not leave any items on the stairs.  Make sure that there are handrails on both sides of the stairs and use them. Fix handrails that are broken or loose. Make sure that handrails are as long as the stairways.  Check any carpeting to make sure that it is firmly attached to the stairs. Fix any carpet that is loose or worn.  Avoid having throw rugs at the top or bottom of the stairs. If you do have throw rugs, attach them to the floor with carpet tape.  Make sure that you have a light switch at the top of the stairs and the bottom of the stairs. If you do not have them, ask someone to add them for you. What else can I do to help prevent falls?  Wear shoes that:  Do not have high heels.  Have rubber bottoms.  Are comfortable and fit you well.  Are closed at the toe. Do not wear sandals.  If you use a stepladder:  Make sure that it is fully opened. Do not climb a closed stepladder.  Make  sure that both sides of the stepladder are locked into place.  Ask someone to hold it for you, if possible.  Clearly mark and make sure that you can see:  Any grab bars or handrails.  First and last steps.  Where the edge of each step is.  Use tools that help you move around (mobility aids) if they are needed. These include:  Canes.  Walkers.  Scooters.  Crutches.  Turn on the lights when you go into a dark area. Replace any light bulbs as soon as they burn out.  Set up your furniture so you have a clear path. Avoid moving your furniture around.  If any of your floors are uneven, fix them.  If there are any pets around you, be aware of where they are.  Review your medicines with your doctor. Some medicines can make you feel dizzy. This can increase your chance of falling. Ask your doctor what other things that you can do to help prevent falls. This information is not intended to replace advice given to you by your health care provider. Make sure you discuss any questions you have with your health care provider. Document Released: 07/13/2009 Document Revised: 02/22/2016 Document Reviewed: 10/21/2014 Elsevier Interactive Patient Education  2017 [employer].

## 2020-08-08 IMAGING — MG DIGITAL SCREENING BILATERAL MAMMOGRAM WITH TOMO AND CAD
8 series · 8 of 24 positions shown · non-contrast
Comparison: Previous exam(s).

CLINICAL DATA: Screening.

EXAM:
DIGITAL SCREENING BILATERAL MAMMOGRAM WITH TOMO AND CAD

[L MLO synth-2D]
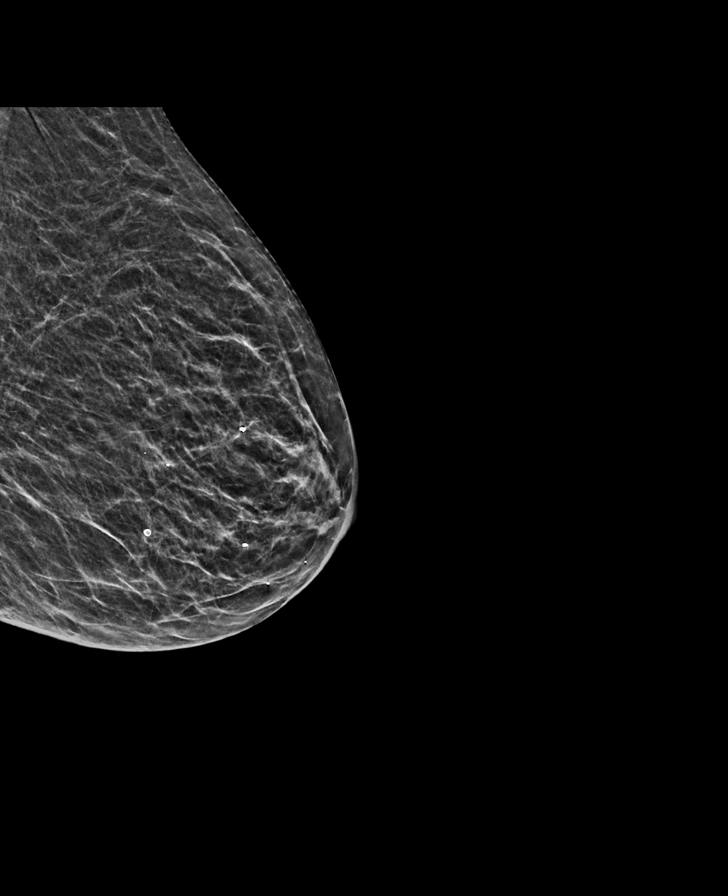

[R CC synth-2D]
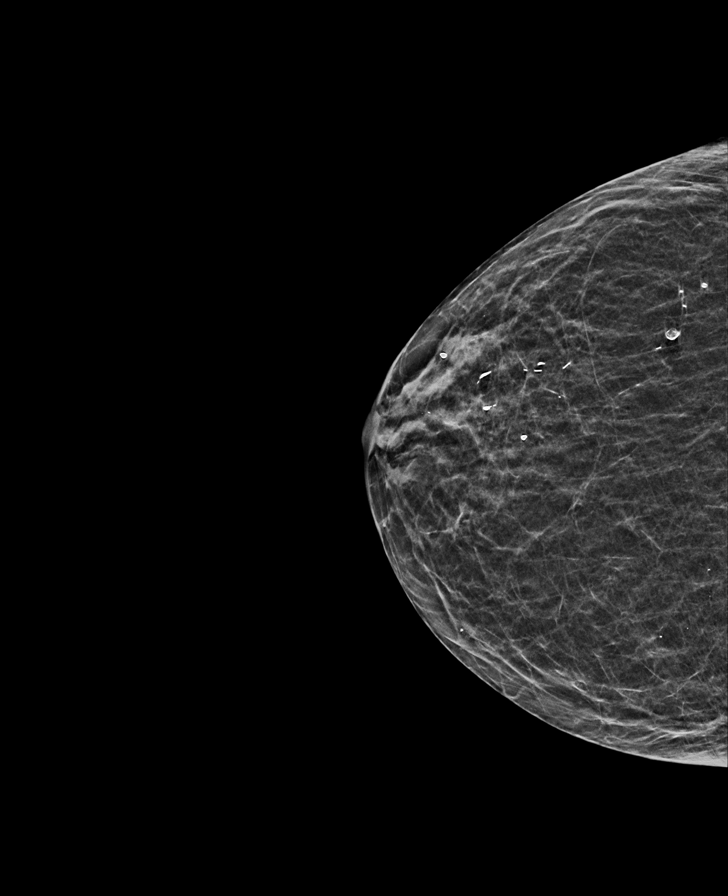

[R MLO synth-2D]
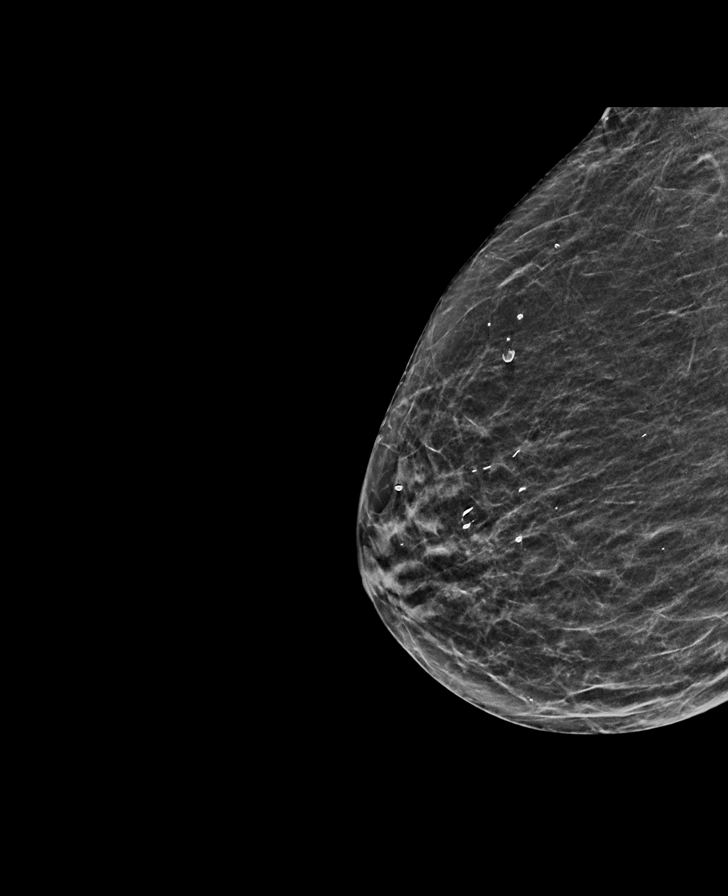

[L CC synth-2D]
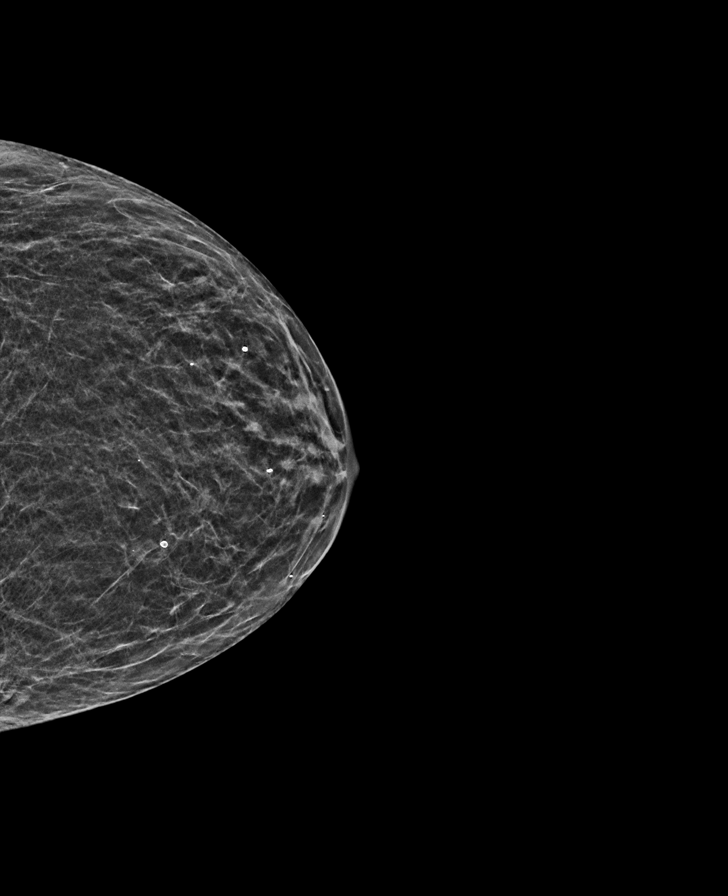

[L MLO tomo · tomo slice 22/43.0]
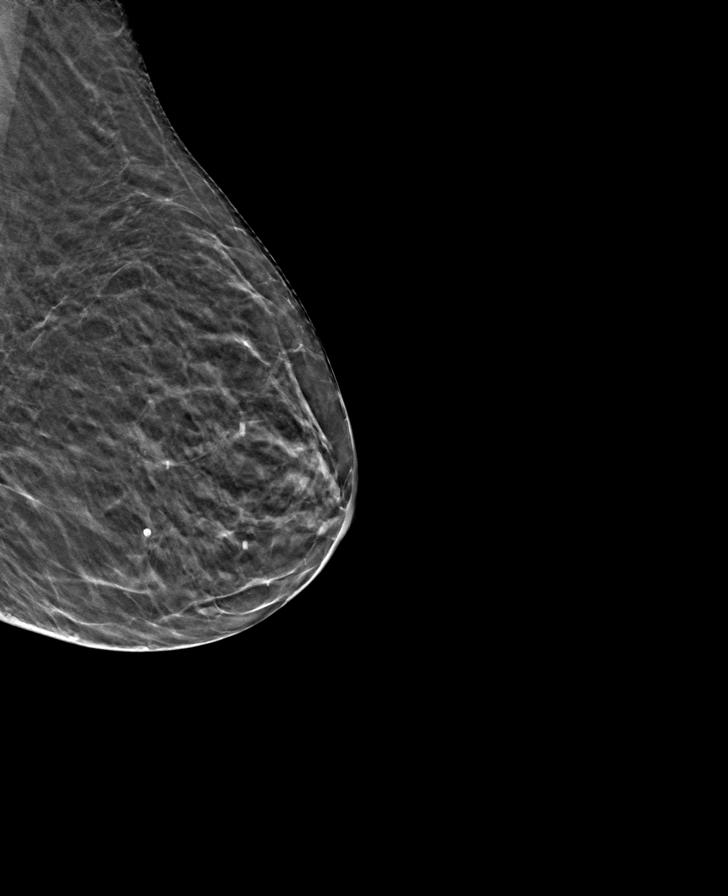

[R CC tomo · tomo slice 21/40.0]
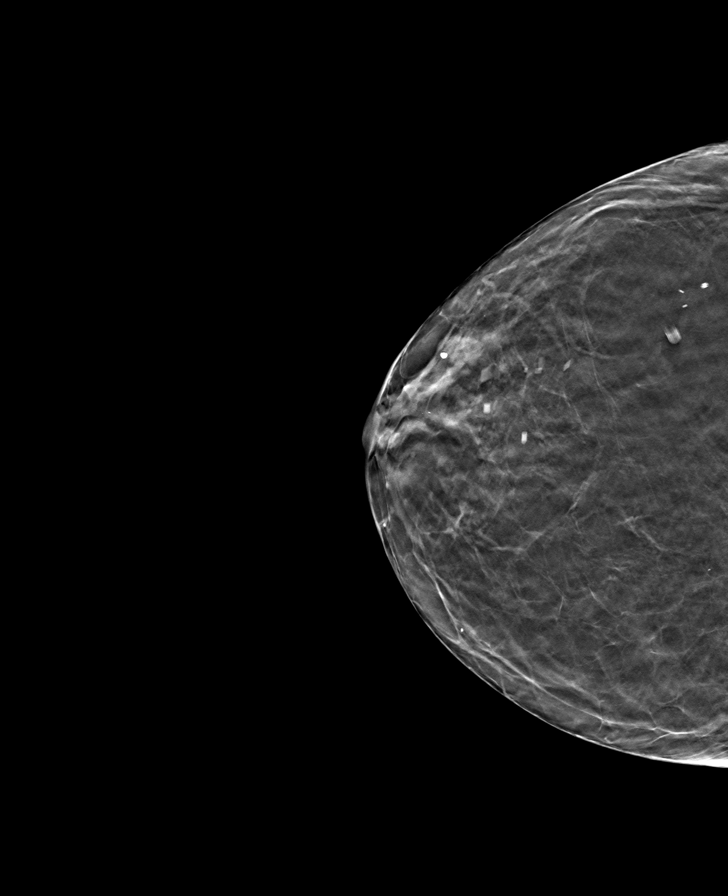

[R MLO tomo · tomo slice 25/48.0]
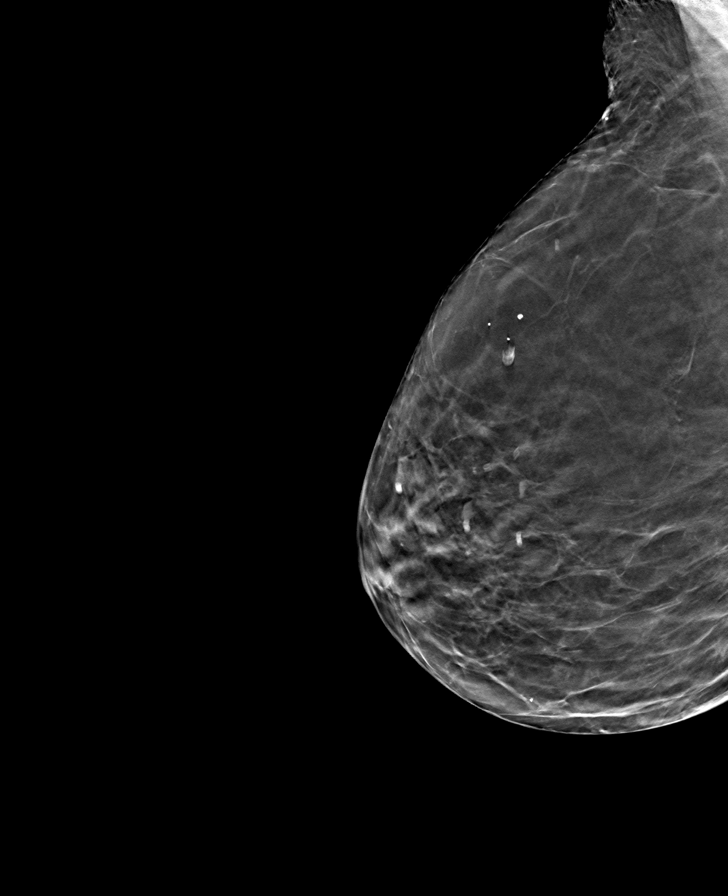

[L CC tomo · tomo slice 21/41.0]
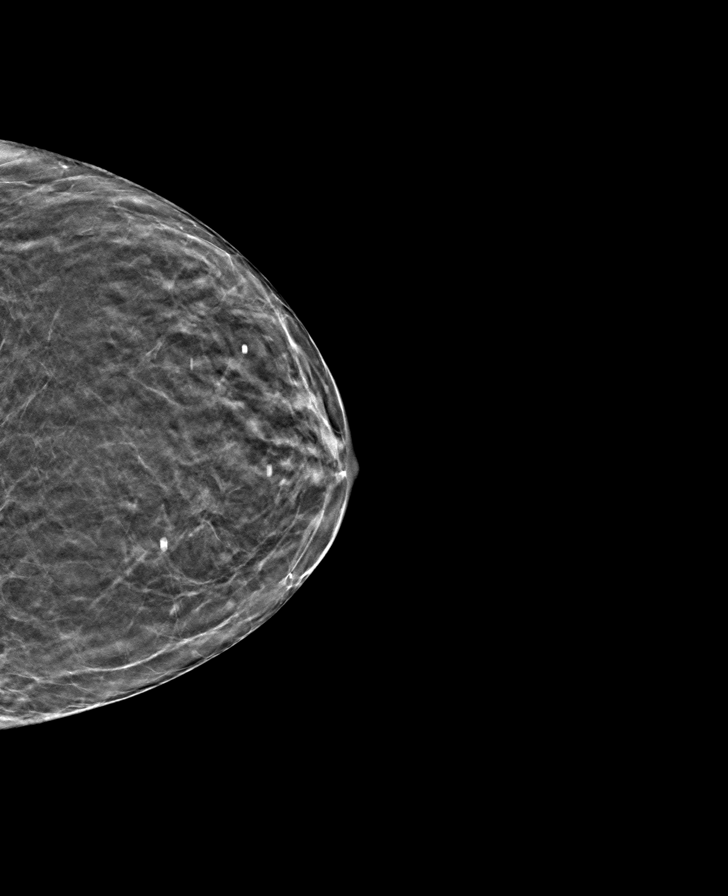

[8 of 24 positions shown; findings below may reference images not displayed]

ACR Breast Density Category b: There are scattered areas of
fibroglandular density.
FINDINGS: There are no findings suspicious for malignancy. Images were
processed with CAD.
IMPRESSION: No mammographic evidence of malignancy. A result letter of this
screening mammogram will be mailed directly to the patient.

RECOMMENDATION:
Screening mammogram in one year. (Code:[id number])

BI-RADS CATEGORY  1: Negative.

## 2020-08-17 ENCOUNTER — Ambulatory Visit: Payer: Medicare Other | Admitting: Internal Medicine

## 2020-08-28 ENCOUNTER — Ambulatory Visit (INDEPENDENT_AMBULATORY_CARE_PROVIDER_SITE_OTHER): Payer: Medicare Other | Admitting: Internal Medicine

## 2020-08-28 ENCOUNTER — Other Ambulatory Visit: Payer: Self-pay

## 2020-08-28 ENCOUNTER — Encounter: Payer: Self-pay | Admitting: Internal Medicine

## 2020-08-28 VITALS — BP 134/78 | HR 52 | Temp 97.6°F | Ht 61.0 in | Wt 132.0 lb

## 2020-08-28 DIAGNOSIS — Z23 Encounter for immunization: Secondary | ICD-10-CM

## 2020-08-28 DIAGNOSIS — E118 Type 2 diabetes mellitus with unspecified complications: Secondary | ICD-10-CM

## 2020-08-28 DIAGNOSIS — I10 Essential (primary) hypertension: Secondary | ICD-10-CM

## 2020-08-28 DIAGNOSIS — K2270 Barrett's esophagus without dysplasia: Secondary | ICD-10-CM

## 2020-08-28 NOTE — Progress Notes (Signed)
Date:  08/28/2020   Name:  Lori Harrell   DOB:  [birth year]   MRN:  [id number]   Chief Complaint: Diabetes (4 month follow up. ) and Hypertension (4 month follow up.)  Diabetes She presents for her follow-up diabetic visit. She has type 2 diabetes mellitus. Her disease course has been stable. Pertinent negatives for hypoglycemia include no headaches or tremors. Pertinent negatives for diabetes include no chest pain, no fatigue, no polydipsia and no polyuria. Current diabetic treatment includes oral agent (monotherapy) (metformin reduced to 250 mg per day). She is compliant with treatment all of the time. Her weight is stable. She participates in exercise daily. There is no change in her home blood glucose trend. Her breakfast blood glucose is taken between 6-7 am. Her breakfast blood glucose range is generally 110-130 mg/dl. An ACE inhibitor/angiotensin II receptor blocker is being taken. Eye exam is current.  Hypertension This is a chronic problem. The problem is controlled. Pertinent negatives include no chest pain, headaches, palpitations or shortness of breath. Past treatments include ACE inhibitors and diuretics. The current treatment provides significant improvement. There are no compliance problems.     Lab Results  Component Value Date   CREATININE 0.79 12/10/2019   BUN 14 12/10/2019   NA 134 12/10/2019   K 4.4 12/10/2019   CL 99 12/10/2019   CO2 20 12/10/2019   Lab Results  Component Value Date   CHOL 150 12/10/2019   HDL 48 12/10/2019   LDLCALC 87 12/10/2019   TRIG 76 12/10/2019   CHOLHDL 3.1 12/10/2019   Lab Results  Component Value Date   TSH 4.760 (H) 07/09/2018   Lab Results  Component Value Date   HGBA1C 5.9 (H) 04/10/2020   Lab Results  Component Value Date   WBC 8.2 12/10/2019   HGB 11.3 12/10/2019   HCT 34.5 12/10/2019   MCV 94 12/10/2019   PLT 236 12/10/2019   Lab Results  Component Value Date   ALT 15 12/10/2019   AST 23 12/10/2019    ALKPHOS 68 12/10/2019   BILITOT 0.4 12/10/2019     Review of Systems  Constitutional: Negative for appetite change, fatigue, fever and unexpected weight change.  HENT: Negative for tinnitus and trouble swallowing.   Eyes: Negative for visual disturbance.  Respiratory: Negative for cough, chest tightness and shortness of breath.   Cardiovascular: Negative for chest pain, palpitations and leg swelling.  Gastrointestinal: Negative for abdominal pain.  Endocrine: Negative for polydipsia and polyuria.  Genitourinary: Negative for dysuria and hematuria.  Musculoskeletal: Negative for arthralgias.  Neurological: Negative for tremors, numbness and headaches.  Psychiatric/Behavioral: Negative for dysphoric mood.    Patient Active Problem List   Diagnosis Date Noted  . S/P lumbar fusion 05/19/2019  . 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 DDD (degenerative disc disease), lumbar 01/07/2019  . Carotid artery stenosis, asymptomatic, bilateral 07/07/2017  . Primary open angle glaucoma 05/08/2017  . 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 Large hiatal hernia 07/04/2016  . Sigmoid diverticulitis 07/04/2016  . Stress incontinence 07/04/2016  . Type II diabetes mellitus with complication ([place]) 09/32/6712  . Constipation 10/31/2015  . Barrett's esophagus without dysplasia 06/16/2015  . Essential (primary) hypertension 06/16/2015  . Hyperlipidemia associated with type 2 diabetes mellitus ([place]) 06/16/2015  . OP (osteoporosis) 06/16/2015  . Hypercalcemia 04/22/2012    No Known Allergies  Past Surgical History:  Procedure Laterality Date  . ANTERIOR LATERAL LUMBAR FUSION WITH PERCUTANEOUS SCREW 2 LEVEL N/A  05/19/2019   Procedure: L2-4 LATERAL LUMBAR INTERBODY FUSION (XLIF);  Surgeon: Meade Maw, MD;  Location: [facility] ORS;  Service: Neurosurgery;  Laterality: N/A;  . CATARACT EXTRACTION    . CHOLECYSTECTOMY, LAPAROSCOPIC  02/05/2019   [facility]  . COLONOSCOPY  2007   sigmoid  diverticulosis  . ENDOSCOPIC RETROGRADE CHOLANGIOPANCREATOGRAPHY (ERCP) WITH PROPOFOL N/A 02/02/2019   Procedure: ENDOSCOPIC RETROGRADE CHOLANGIOPANCREATOGRAPHY (ERCP) WITH PROPOFOL;  Surgeon: Lucilla Lame, MD;  Location: [facility] ENDOSCOPY;  Service: Endoscopy;  Laterality: N/A;  . TONSILLECTOMY    . UPPER GASTROINTESTINAL ENDOSCOPY  02/2013   Barrett's - done at [place] History   Tobacco Use  . Smoking status: Never Smoker  . Smokeless tobacco: Never Used  . Tobacco comment: smoking cessation materials not required  Vaping Use  . Vaping Use: Never used  Substance Use Topics  . Alcohol use: No    Alcohol/week: 0.0 standard drinks  . Drug use: No     Medication list has been reviewed and updated.  Current Meds  Medication Sig  . ASPERCREME LIDOCAINE EX Apply 1 application topically 3 (three) times daily as needed (pain.).  Marland Kitchen aspirin EC 81 MG tablet Take 81 mg by mouth daily.  Marland Kitchen atorvastatin (LIPITOR) 20 MG tablet TAKE 1 TABLET DAILY  . Calcium Carbonate-Vit D-Min (CALCIUM 1200 PO) Take 1 tablet by mouth 3 (three) times a week. At bedtime.  . dorzolamide (TRUSOPT) 2 % ophthalmic solution 1 drop 3 (three) times daily.  . enalapril (VASOTEC) 20 MG tablet Take 1 tablet (20 mg total) by mouth daily.  Marland Kitchen esomeprazole (NEXIUM) 40 MG capsule Take 40 mg by mouth 2 (two) times daily.  Marland Kitchen FREESTYLE LITE test strip USE TO TEST BLOOD SUGAR DAILY  . Inulin (FIBER CHOICE FRUITY BITES) 1.5 g CHEW Chew 1 tablet by mouth 2 (two) times daily.   . Lancets (FREESTYLE) lancets USE 1 LANCET DAILY  . latanoprost (XALATAN) 0.005 % ophthalmic solution Place 1 drop into both eyes at bedtime.  . Menthol, Topical Analgesic, (BIOFREEZE) 10 % LIQD Apply topically. biofreeze  . metFORMIN (GLUCOPHAGE) 500 MG tablet TAKE 1 TABLET DAILY (Patient taking differently: Take 250 mg by mouth daily with breakfast. )  . Multiple Vitamin (MULTIVITAMIN WITH MINERALS) TABS tablet Take 1 tablet  by mouth every evening. Women's Centrum Silver  . naproxen (NAPROSYN) 500 MG tablet Take 500 mg by mouth 2 (two) times daily with a meal.  . polyethylene glycol (MIRALAX / GLYCOLAX) 17 g packet Take 17 g by mouth at bedtime as needed (constipation.).  Marland Kitchen spironolactone (ALDACTONE) 25 MG tablet TAKE 1 TABLET DAILY  . timolol (TIMOPTIC) 0.5 % ophthalmic solution Place 1 drop into the left eye daily.     PHQ 2/9 Scores 08/28/2020 07/17/2020 04/10/2020 12/10/2019  PHQ - 2 Score 0 0 0 0  PHQ- 9 Score 0 - 0 1    GAD 7 : Generalized Anxiety Score 08/28/2020 04/10/2020  Nervous, Anxious, on Edge 0 0  Control/stop worrying 0 0  Worry too much - different things 0 0  Trouble relaxing 0 0  Restless 0 0  Easily annoyed or irritable 0 0  Afraid - awful might happen 0 0  Total GAD 7 Score 0 0  Anxiety Difficulty Not difficult at all Not difficult at all    BP Readings from Last 3 Encounters:  08/28/20 134/78  04/10/20 122/82  12/10/19 140/78    Physical Exam Vitals and nursing note  reviewed.  Constitutional:      General: She is not in acute distress.    Appearance: She is well-developed.  HENT:     Head: Normocephalic and atraumatic.  Neck:     Vascular: No carotid bruit.  Cardiovascular:     Rate and Rhythm: Normal rate and regular rhythm.  Pulmonary:     Effort: Pulmonary effort is normal. No respiratory distress.     Breath sounds: No wheezing or rhonchi.  Musculoskeletal:     Cervical back: Normal range of motion.     Right lower leg: No edema.     Left lower leg: No edema.  Lymphadenopathy:     Cervical: No cervical adenopathy.  Skin:    General: Skin is warm and dry.     Capillary Refill: Capillary refill takes less than 2 seconds.     Findings: No rash.  Neurological:     General: No focal deficit present.     Mental Status: She is alert and oriented to person, place, and time.  Psychiatric:        Behavior: Behavior normal.        Thought Content: Thought content  normal.     Wt Readings from Last 3 Encounters:  08/28/20 132 lb (59.9 kg)  04/10/20 131 lb (59.4 kg)  12/10/19 126 lb (57.2 kg)    BP 134/78   Pulse (!) 52   Temp 97.6 F (36.4 C) (Oral)   Ht 5\' 1"  (1.549 m)   Wt 132 lb (59.9 kg)   SpO2 98%   BMI 24.94 kg/m   Assessment and Plan: 1. Type II diabetes mellitus with complication (HCC) Clinically stable by exam and report without s/s of hypoglycemia. DM complicated by HTN. Tolerating medications - low dose metformin -  well without side effects or other concerns. - Hemoglobin A1c  2. Essential (primary) hypertension Clinically stable exam with well controlled BP. Tolerating medications without side effects at this time. Pt to continue current regimen and low sodium diet; benefits of regular exercise as able discussed. - Basic metabolic panel  3. Barrett's esophagus without dysplasia Continue daily PPI   Partially dictated using Editor, commissioning. Any errors are unintentional.  Halina Maidens, MD [facility]  08/28/2020

## 2020-08-29 LAB — BASIC METABOLIC PANEL
BUN/Creatinine Ratio: 19 (ref 12–28)
BUN: 17 mg/dL (ref 8–27)
CO2: 22 mmol/L (ref 20–29)
Calcium: 10.3 mg/dL (ref 8.7–10.3)
Chloride: 102 mmol/L (ref 96–106)
Creatinine, Ser: 0.88 mg/dL (ref 0.57–1.00)
GFR calc Af Amer: 72 mL/min/{1.73_m2} (ref >59)
GFR calc non Af Amer: 62 mL/min/{1.73_m2} (ref >59)
Glucose: 110 mg/dL — ABNORMAL HIGH (ref 65–99)
Potassium: 5.2 mmol/L (ref 3.5–5.2)
Sodium: 140 mmol/L (ref 134–144)

## 2020-08-29 LAB — HEMOGLOBIN A1C
Est. average glucose Bld gHb Est-mCnc: 134 mg/dL
Hgb A1c MFr Bld: 6.3 % — ABNORMAL HIGH (ref 4.8–5.6)

## 2020-10-03 ENCOUNTER — Other Ambulatory Visit: Payer: Self-pay | Admitting: Internal Medicine

## 2020-10-03 DIAGNOSIS — I10 Essential (primary) hypertension: Secondary | ICD-10-CM

## 2020-10-03 DIAGNOSIS — E785 Hyperlipidemia, unspecified: Secondary | ICD-10-CM

## 2020-10-03 DIAGNOSIS — E1169 Type 2 diabetes mellitus with other specified complication: Secondary | ICD-10-CM

## 2020-10-14 ENCOUNTER — Other Ambulatory Visit: Payer: Self-pay | Admitting: Internal Medicine

## 2020-10-14 DIAGNOSIS — E119 Type 2 diabetes mellitus without complications: Secondary | ICD-10-CM

## 2020-12-09 ENCOUNTER — Other Ambulatory Visit: Payer: Self-pay | Admitting: Internal Medicine

## 2020-12-09 DIAGNOSIS — E119 Type 2 diabetes mellitus without complications: Secondary | ICD-10-CM

## 2020-12-09 NOTE — Telephone Encounter (Signed)
Requested Prescriptions  Pending Prescriptions Disp Refills  . Lancets (FREESTYLE) lancets [Pharmacy Med Name: LANCETS FREESTYLE 100'S 28G] 100 each 3    Sig: USE 1 LANCET DAILY     Endocrinology: Diabetes - Testing Supplies Passed - 12/09/2020 10:01 AM      Passed - Valid encounter within last 12 months    Recent Outpatient Visits          3 months ago Type II diabetes mellitus with complication [facility])   [facility] Glean Hess, MD   8 months ago Type II diabetes mellitus with complication [facility])   [facility] Glean Hess, MD   1 year ago Essential (primary) hypertension   [facility] Glean Hess, MD   1 year ago Type II diabetes mellitus with complication [facility])   [facility] Glean Hess, MD   1 year ago Acute cholangitis   [facility] Glean Hess, MD      Future Appointments            In 3 days Glean Hess, MD [facility], [facility]

## 2020-12-12 ENCOUNTER — Ambulatory Visit (INDEPENDENT_AMBULATORY_CARE_PROVIDER_SITE_OTHER): Payer: Medicare Other | Admitting: Internal Medicine

## 2020-12-12 ENCOUNTER — Encounter: Payer: Self-pay | Admitting: Internal Medicine

## 2020-12-12 ENCOUNTER — Other Ambulatory Visit: Payer: Self-pay

## 2020-12-12 ENCOUNTER — Other Ambulatory Visit: Payer: Self-pay | Admitting: Internal Medicine

## 2020-12-12 VITALS — BP 128/72 | HR 66 | Ht 61.0 in | Wt 134.0 lb

## 2020-12-12 DIAGNOSIS — Z0000 Encounter for general adult medical examination without abnormal findings: Secondary | ICD-10-CM | POA: Diagnosis not present

## 2020-12-12 DIAGNOSIS — E118 Type 2 diabetes mellitus with unspecified complications: Secondary | ICD-10-CM

## 2020-12-12 DIAGNOSIS — E1169 Type 2 diabetes mellitus with other specified complication: Secondary | ICD-10-CM

## 2020-12-12 DIAGNOSIS — R350 Frequency of micturition: Secondary | ICD-10-CM

## 2020-12-12 DIAGNOSIS — E785 Hyperlipidemia, unspecified: Secondary | ICD-10-CM

## 2020-12-12 DIAGNOSIS — I10 Essential (primary) hypertension: Secondary | ICD-10-CM

## 2020-12-12 DIAGNOSIS — R7989 Other specified abnormal findings of blood chemistry: Secondary | ICD-10-CM | POA: Diagnosis not present

## 2020-12-12 LAB — POCT URINALYSIS DIPSTICK
Bilirubin, UA: NEGATIVE
Glucose, UA: NEGATIVE
Ketones, UA: NEGATIVE
Nitrite, UA: POSITIVE
Protein, UA: NEGATIVE
Spec Grav, UA: 1.01 (ref 1.010–1.025)
Urobilinogen, UA: 0.2 E.U./dL
pH, UA: 5 (ref 5.0–8.0)

## 2020-12-12 MED ORDER — METFORMIN HCL 500 MG PO TABS: 500.0000 mg | ORAL_TABLET | Freq: Every day | ORAL | 3 refills | Status: DC

## 2020-12-12 NOTE — Progress Notes (Signed)
Date:  12/12/2020   Name:  Lori Harrell   DOB:  [birth year]   MRN:  [id number]   Chief Complaint: Annual Exam (Breast Exam. No pap- discontinued. Foot Exam. )  Lori Harrell is a 80 y.o. female who presents today for her Complete Annual Exam. She feels fairly well. She reports exercising - walking for 30 mins/ 5 days weekly.. She reports she is sleeping poorly. Breast complaints - none.  Mammogram: 03/2019 DEXA: 06/2018 osteopenia hip/osteoporosis arm Colonoscopy: aged out Eye exam 04/2020  Immunization History  Administered Date(s) Administered  . Fluad Quad(high Dose 65+) 08/12/2019, 08/28/2020  . Influenza, High Dose Seasonal PF 07/07/2017, 07/08/2018  . Influenza, Seasonal, Injecte, Preservative Fre 08/19/2013  . Influenza,inj,Quad PF,6+ Mos 06/23/2015, 07/04/2016  . Influenza-Unspecified 06/18/2011, 08/21/2012  . PFIZER(Purple Top)SARS-COV-2 Vaccination 12/24/2019, 01/14/2020, 10/13/2020  . Pneumococcal Conjugate-13 06/20/2014  . Pneumococcal Polysaccharide-23 10/02/2007  . Tdap 10/01/2010    Hypertension This is a chronic problem. The problem is controlled. Pertinent negatives include no chest pain, headaches, palpitations or shortness of breath. Past treatments include ACE inhibitors and diuretics. The current treatment provides significant improvement. There are no compliance problems.  There is no history of kidney disease, CAD/MI or CVA.  Diabetes She presents for her follow-up diabetic visit. She has type 2 diabetes mellitus. Her disease course has been stable. There are no hypoglycemic associated symptoms. Pertinent negatives for hypoglycemia include no dizziness, headaches, nervousness/anxiousness or tremors. Pertinent negatives for diabetes include no chest pain, no fatigue, no polydipsia and no polyuria. Pertinent negatives for diabetic complications include no CVA. Current diabetic treatment includes oral agent (monotherapy). She is compliant with  treatment all of the time. Her weight is stable. Her breakfast blood glucose is taken between 7-8 am. Her breakfast blood glucose range is generally 90-110 mg/dl. An ACE inhibitor/angiotensin II receptor blocker is being taken. Eye exam is current.  Hyperlipidemia This is a chronic problem. The problem is controlled. Pertinent negatives include no chest pain or shortness of breath. Current antihyperlipidemic treatment includes statins. The current treatment provides significant improvement of lipids. There are no compliance problems.   Gastroesophageal Reflux She complains of heartburn. She reports no abdominal pain, no chest pain, no coughing or no wheezing. This is a chronic problem. The problem occurs occasionally. Pertinent negatives include no fatigue. Risk factors include Barrett's esophagus. She has tried a PPI for the symptoms. Past procedures include an EGD.    Lab Results  Component Value Date   CREATININE 0.88 08/28/2020   BUN 17 08/28/2020   NA 140 08/28/2020   K 5.2 08/28/2020   CL 102 08/28/2020   CO2 22 08/28/2020   Lab Results  Component Value Date   CHOL 150 12/10/2019   HDL 48 12/10/2019   LDLCALC 87 12/10/2019   TRIG 76 12/10/2019   CHOLHDL 3.1 12/10/2019   Lab Results  Component Value Date   TSH 4.760 (H) 07/09/2018   Lab Results  Component Value Date   HGBA1C 6.3 (H) 08/28/2020   Lab Results  Component Value Date   WBC 8.2 12/10/2019   HGB 11.3 12/10/2019   HCT 34.5 12/10/2019   MCV 94 12/10/2019   PLT 236 12/10/2019   Lab Results  Component Value Date   ALT 15 12/10/2019   AST 23 12/10/2019   ALKPHOS 68 12/10/2019   BILITOT 0.4 12/10/2019     Review of Systems  Constitutional: Negative for chills, fatigue and fever.  HENT: Negative for congestion, hearing loss,  tinnitus, trouble swallowing and voice change.   Eyes: Negative for visual disturbance.  Respiratory: Negative for cough, chest tightness, shortness of breath and wheezing.    Cardiovascular: Negative for chest pain, palpitations and leg swelling.  Gastrointestinal: Positive for heartburn. Negative for abdominal pain, constipation, diarrhea and vomiting.  Endocrine: Negative for polydipsia and polyuria.  Genitourinary: Negative for dysuria, frequency, genital sores, vaginal bleeding and vaginal discharge.  Musculoskeletal: Negative for arthralgias, gait problem and joint swelling.  Skin: Negative for color change and rash.  Neurological: Negative for dizziness, tremors, light-headedness and headaches.  Hematological: Negative for adenopathy. Does not bruise/bleed easily.  Psychiatric/Behavioral: Negative for dysphoric mood and sleep disturbance. The patient is not nervous/anxious.     Patient Active Problem List   Diagnosis Date Noted  . S/P lumbar fusion 05/19/2019  . 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 DDD (degenerative disc disease), lumbar 01/07/2019  . Carotid artery stenosis, asymptomatic, bilateral 07/07/2017  . Primary open angle glaucoma 05/08/2017  . 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 Large hiatal hernia 07/04/2016  . Sigmoid diverticulitis 07/04/2016  . Stress incontinence 07/04/2016  . Type II diabetes mellitus with complication ([place]) 89/38/1017  . Constipation 10/31/2015  . Barrett's esophagus without dysplasia 06/16/2015  . Essential (primary) hypertension 06/16/2015  . Hyperlipidemia associated with type 2 diabetes mellitus ([place]) 06/16/2015  . OP (osteoporosis) 06/16/2015  . Hypercalcemia 04/22/2012    No Known Allergies  Past Surgical History:  Procedure Laterality Date  . ANTERIOR LATERAL LUMBAR FUSION WITH PERCUTANEOUS SCREW 2 LEVEL N/A 05/19/2019   Procedure: L2-4 LATERAL LUMBAR INTERBODY FUSION (XLIF);  Surgeon: Meade Maw, MD;  Location: [facility] ORS;  Service: Neurosurgery;  Laterality: N/A;  . CATARACT EXTRACTION    . CHOLECYSTECTOMY, LAPAROSCOPIC  02/05/2019   [facility]  . COLONOSCOPY  2007   sigmoid  diverticulosis  . ENDOSCOPIC RETROGRADE CHOLANGIOPANCREATOGRAPHY (ERCP) WITH PROPOFOL N/A 02/02/2019   Procedure: ENDOSCOPIC RETROGRADE CHOLANGIOPANCREATOGRAPHY (ERCP) WITH PROPOFOL;  Surgeon: Lucilla Lame, MD;  Location: [facility] ENDOSCOPY;  Service: Endoscopy;  Laterality: N/A;  . TONSILLECTOMY    . UPPER GASTROINTESTINAL ENDOSCOPY  02/2013   Barrett's - done at [place] History   Tobacco Use  . Smoking status: Never Smoker  . Smokeless tobacco: Never Used  . Tobacco comment: smoking cessation materials not required  Vaping Use  . Vaping Use: Never used  Substance Use Topics  . Alcohol use: No    Alcohol/week: 0.0 standard drinks  . Drug use: No     Medication list has been reviewed and updated.  Current Meds  Medication Sig  . ASPERCREME LIDOCAINE EX Apply 1 application topically 3 (three) times daily as needed (pain.).  Marland Kitchen atorvastatin (LIPITOR) 20 MG tablet TAKE 1 TABLET DAILY  . Calcium Carbonate-Vit D-Min (CALCIUM 1200 PO) Take 1 tablet by mouth 3 (three) times a week. At bedtime.  . dorzolamide (TRUSOPT) 2 % ophthalmic solution 1 drop 3 (three) times daily.  . enalapril (VASOTEC) 20 MG tablet Take 1 tablet (20 mg total) by mouth daily.  Marland Kitchen esomeprazole (NEXIUM) 40 MG capsule Take 40 mg by mouth 2 (two) times daily.  Marland Kitchen FREESTYLE LITE test strip USE TO TEST BLOOD SUGAR DAILY  . Inulin 1.5 g CHEW Chew 1 tablet by mouth 2 (two) times daily.   . Lancets (FREESTYLE) lancets USE 1 LANCET DAILY  . latanoprost (XALATAN) 0.005 % ophthalmic solution Place 1 drop into both eyes at bedtime.  Marland Kitchen  Menthol, Topical Analgesic, (BIOFREEZE) 10 % LIQD Apply topically. biofreeze  . metFORMIN (GLUCOPHAGE) 500 MG tablet TAKE 1 TABLET DAILY  . Multiple Vitamin (MULTIVITAMIN WITH MINERALS) TABS tablet Take 1 tablet by mouth every evening. Women's Centrum Silver  . naproxen (NAPROSYN) 500 MG tablet Take 500 mg by mouth 2 (two) times daily with a meal.  . polyethylene  glycol (MIRALAX / GLYCOLAX) 17 g packet Take 17 g by mouth at bedtime as needed (constipation.).  Marland Kitchen spironolactone (ALDACTONE) 25 MG tablet TAKE 1 TABLET DAILY  . timolol (TIMOPTIC) 0.5 % ophthalmic solution Place 1 drop into the left eye daily.     PHQ 2/9 Scores 12/12/2020 08/28/2020 07/17/2020 04/10/2020  PHQ - 2 Score 0 0 0 0  PHQ- 9 Score 0 0 - 0    GAD 7 : Generalized Anxiety Score 12/12/2020 08/28/2020 04/10/2020  Nervous, Anxious, on Edge 0 0 0  Control/stop worrying 0 0 0  Worry too much - different things 0 0 0  Trouble relaxing 0 0 0  Restless 0 0 0  Easily annoyed or irritable 0 0 0  Afraid - awful might happen 0 0 0  Total GAD 7 Score 0 0 0  Anxiety Difficulty Not difficult at all Not difficult at all Not difficult at all    BP Readings from Last 3 Encounters:  12/12/20 128/72  08/28/20 134/78  04/10/20 122/82    Physical Exam Vitals and nursing note reviewed.  Constitutional:      General: She is not in acute distress.    Appearance: She is well-developed.  HENT:     Head: Normocephalic and atraumatic.     Right Ear: Tympanic membrane and ear canal normal.     Left Ear: Tympanic membrane and ear canal normal.     Nose:     Right Sinus: No maxillary sinus tenderness.     Left Sinus: No maxillary sinus tenderness.  Eyes:     General: No scleral icterus.       Right eye: No discharge.        Left eye: No discharge.     Conjunctiva/sclera: Conjunctivae normal.  Neck:     Thyroid: No thyromegaly.     Vascular: No carotid bruit.  Cardiovascular:     Rate and Rhythm: Normal rate and regular rhythm.     Pulses: Normal pulses.     Heart sounds: Normal heart sounds.  Pulmonary:     Effort: Pulmonary effort is normal. No respiratory distress.     Breath sounds: No wheezing.  Chest:  Breasts:     Right: No mass, nipple discharge, skin change or tenderness.     Left: No mass, nipple discharge, skin change or tenderness.    Abdominal:     General: Bowel  sounds are normal.     Palpations: Abdomen is soft.     Tenderness: There is no abdominal tenderness.  Musculoskeletal:     Cervical back: Normal range of motion. No erythema.     Right lower leg: No edema.     Left lower leg: No edema.  Lymphadenopathy:     Cervical: No cervical adenopathy.  Skin:    General: Skin is warm and dry.     Findings: No rash.  Neurological:     Mental Status: She is alert and oriented to person, place, and time.     Cranial Nerves: No cranial nerve deficit.     Sensory: No sensory deficit.     Deep  Tendon Reflexes: Reflexes are normal and symmetric.  Psychiatric:        Attention and Perception: Attention normal.        Mood and Affect: Mood normal.     Wt Readings from Last 3 Encounters:  12/12/20 134 lb (60.8 kg)  08/28/20 132 lb (59.9 kg)  04/10/20 131 lb (59.4 kg)    BP 128/72   Pulse 66   Ht 5\' 1"  (1.549 m)   Wt 134 lb (60.8 kg)   SpO2 98%   BMI 25.32 kg/m   Assessment and Plan: 1. Annual physical exam Normal exam for age Continue healthy diet, regular exercise  2. Essential (primary) hypertension Clinically stable exam with well controlled BP on enalapril. Tolerating medications without side effects at this time. Pt to continue current regimen and low sodium diet; benefits of regular exercise as able discussed. - CBC with Differential/Platelet - TSH  3. Type II diabetes mellitus with complication (HCC) Clinically stable by exam and report without s/s of hypoglycemia. DM complicated by HTN. Tolerating medications - metformin 250 mg -  well without side effects or other concerns. - Comprehensive metabolic panel - Hemoglobin A1c - POCT urinalysis dipstick  4. Hyperlipidemia associated with type 2 diabetes mellitus ([place]) Tolerating statin medication without side effects at this time LDL is almost at goal of < 70 on current dose Continue same therapy without change at this time. - Lipid panel  5. Urinary frequency Minimal  sx so will obtain culture before treating - POCT urinalysis dipstick Urine culture ordered.   Partially dictated using Editor, commissioning. Any errors are unintentional.  Halina Maidens, MD [facility]  12/12/2020

## 2020-12-13 LAB — COMPREHENSIVE METABOLIC PANEL
ALT: 21 IU/L (ref 0–32)
AST: 28 IU/L (ref 0–40)
Albumin/Globulin Ratio: 2.1 (ref 1.2–2.2)
Albumin: 4.7 g/dL (ref 3.7–4.7)
Alkaline Phosphatase: 66 IU/L (ref 44–121)
BUN/Creatinine Ratio: 17 (ref 12–28)
BUN: 14 mg/dL (ref 8–27)
Bilirubin Total: 0.4 mg/dL (ref 0.0–1.2)
CO2: 21 mmol/L (ref 20–29)
Calcium: 10 mg/dL (ref 8.7–10.3)
Chloride: 101 mmol/L (ref 96–106)
Creatinine, Ser: 0.83 mg/dL (ref 0.57–1.00)
Globulin, Total: 2.2 g/dL (ref 1.5–4.5)
Glucose: 112 mg/dL — ABNORMAL HIGH (ref 65–99)
Potassium: 4.6 mmol/L (ref 3.5–5.2)
Sodium: 139 mmol/L (ref 134–144)
Total Protein: 6.9 g/dL (ref 6.0–8.5)
eGFR: 71 mL/min/{1.73_m2} (ref >59)

## 2020-12-13 LAB — LIPID PANEL
Chol/HDL Ratio: 3.1 ratio (ref 0.0–4.4)
Cholesterol, Total: 157 mg/dL (ref 100–199)
HDL: 51 mg/dL (ref >39)
LDL Chol Calc (NIH): 91 mg/dL (ref 0–99)
Triglycerides: 76 mg/dL (ref 0–149)
VLDL Cholesterol Cal: 15 mg/dL (ref 5–40)

## 2020-12-13 LAB — CBC WITH DIFFERENTIAL/PLATELET
Basophils Absolute: 0.1 10*3/uL (ref 0.0–0.2)
Basos: 1 %
EOS (ABSOLUTE): 0.1 10*3/uL (ref 0.0–0.4)
Eos: 2 %
Hematocrit: 37.9 % (ref 34.0–46.6)
Hemoglobin: 12.3 g/dL (ref 11.1–15.9)
Immature Grans (Abs): 0 10*3/uL (ref 0.0–0.1)
Immature Granulocytes: 0 %
Lymphocytes Absolute: 1.5 10*3/uL (ref 0.7–3.1)
Lymphs: 19 %
MCH: 30.1 pg (ref 26.6–33.0)
MCHC: 32.5 g/dL (ref 31.5–35.7)
MCV: 93 fL (ref 79–97)
Monocytes Absolute: 0.6 10*3/uL (ref 0.1–0.9)
Monocytes: 8 %
Neutrophils Absolute: 5.3 10*3/uL (ref 1.4–7.0)
Neutrophils: 70 %
Platelets: 248 10*3/uL (ref 150–450)
RBC: 4.08 x10E6/uL (ref 3.77–5.28)
RDW: 12.1 % (ref 11.7–15.4)
WBC: 7.6 10*3/uL (ref 3.4–10.8)

## 2020-12-13 LAB — HEMOGLOBIN A1C
Est. average glucose Bld gHb Est-mCnc: 131 mg/dL
Hgb A1c MFr Bld: 6.2 % — ABNORMAL HIGH (ref 4.8–5.6)

## 2020-12-13 LAB — TSH: TSH: 6.33 u[IU]/mL — ABNORMAL HIGH (ref 0.450–4.500)

## 2020-12-14 LAB — SPECIMEN STATUS REPORT

## 2020-12-14 LAB — T4: T4, Total: 6.6 ug/dL (ref 4.5–12.0)

## 2020-12-14 LAB — T3: T3, Total: 98 ng/dL (ref 71–180)

## 2020-12-15 ENCOUNTER — Other Ambulatory Visit: Payer: Self-pay | Admitting: Internal Medicine

## 2020-12-15 ENCOUNTER — Encounter: Payer: Self-pay | Admitting: Internal Medicine

## 2020-12-15 DIAGNOSIS — N3000 Acute cystitis without hematuria: Secondary | ICD-10-CM

## 2020-12-15 DIAGNOSIS — R7989 Other specified abnormal findings of blood chemistry: Secondary | ICD-10-CM | POA: Insufficient documentation

## 2020-12-15 LAB — URINE CULTURE

## 2020-12-15 MED ORDER — CEFUROXIME AXETIL 250 MG PO TABS: 250.0000 mg | ORAL_TABLET | Freq: Two times a day (BID) | ORAL | 0 refills | Status: AC

## 2021-01-01 ENCOUNTER — Other Ambulatory Visit: Payer: Self-pay | Admitting: Internal Medicine

## 2021-01-01 DIAGNOSIS — I10 Essential (primary) hypertension: Secondary | ICD-10-CM

## 2021-01-09 ENCOUNTER — Other Ambulatory Visit: Payer: Self-pay | Admitting: Internal Medicine

## 2021-02-06 DIAGNOSIS — H5213 Myopia, bilateral: Secondary | ICD-10-CM | POA: Diagnosis not present

## 2021-02-06 DIAGNOSIS — H338 Other retinal detachments: Secondary | ICD-10-CM | POA: Diagnosis not present

## 2021-02-06 DIAGNOSIS — E119 Type 2 diabetes mellitus without complications: Secondary | ICD-10-CM | POA: Diagnosis not present

## 2021-02-06 DIAGNOSIS — H524 Presbyopia: Secondary | ICD-10-CM | POA: Diagnosis not present

## 2021-02-06 DIAGNOSIS — H401113 Primary open-angle glaucoma, right eye, severe stage: Secondary | ICD-10-CM | POA: Diagnosis not present

## 2021-02-06 DIAGNOSIS — H04123 Dry eye syndrome of bilateral lacrimal glands: Secondary | ICD-10-CM | POA: Diagnosis not present

## 2021-02-06 DIAGNOSIS — H52223 Regular astigmatism, bilateral: Secondary | ICD-10-CM | POA: Diagnosis not present

## 2021-02-06 DIAGNOSIS — Z961 Presence of intraocular lens: Secondary | ICD-10-CM | POA: Diagnosis not present

## 2021-02-06 DIAGNOSIS — H401121 Primary open-angle glaucoma, left eye, mild stage: Secondary | ICD-10-CM | POA: Diagnosis not present

## 2021-02-06 LAB — HM DIABETES EYE EXAM

## 2021-02-07 ENCOUNTER — Encounter: Payer: Self-pay | Admitting: Internal Medicine

## 2021-04-02 ENCOUNTER — Other Ambulatory Visit: Payer: Self-pay | Admitting: Internal Medicine

## 2021-04-02 DIAGNOSIS — E1169 Type 2 diabetes mellitus with other specified complication: Secondary | ICD-10-CM

## 2021-04-08 DIAGNOSIS — Z20822 Contact with and (suspected) exposure to covid-19: Secondary | ICD-10-CM | POA: Diagnosis not present

## 2021-05-15 DIAGNOSIS — D2271 Melanocytic nevi of right lower limb, including hip: Secondary | ICD-10-CM | POA: Diagnosis not present

## 2021-05-15 DIAGNOSIS — D225 Melanocytic nevi of trunk: Secondary | ICD-10-CM | POA: Diagnosis not present

## 2021-05-15 DIAGNOSIS — D2262 Melanocytic nevi of left upper limb, including shoulder: Secondary | ICD-10-CM | POA: Diagnosis not present

## 2021-05-15 DIAGNOSIS — C4401 Basal cell carcinoma of skin of lip: Secondary | ICD-10-CM | POA: Diagnosis not present

## 2021-05-15 DIAGNOSIS — L821 Other seborrheic keratosis: Secondary | ICD-10-CM | POA: Diagnosis not present

## 2021-05-15 DIAGNOSIS — D2272 Melanocytic nevi of left lower limb, including hip: Secondary | ICD-10-CM | POA: Diagnosis not present

## 2021-05-15 DIAGNOSIS — D485 Neoplasm of uncertain behavior of skin: Secondary | ICD-10-CM | POA: Diagnosis not present

## 2021-05-15 DIAGNOSIS — D2261 Melanocytic nevi of right upper limb, including shoulder: Secondary | ICD-10-CM | POA: Diagnosis not present

## 2021-05-31 ENCOUNTER — Other Ambulatory Visit: Payer: Self-pay

## 2021-05-31 ENCOUNTER — Ambulatory Visit (INDEPENDENT_AMBULATORY_CARE_PROVIDER_SITE_OTHER): Payer: Medicare Other | Admitting: Internal Medicine

## 2021-05-31 ENCOUNTER — Encounter: Payer: Self-pay | Admitting: Internal Medicine

## 2021-05-31 VITALS — BP 140/82 | HR 63 | Temp 97.9°F | Ht 61.0 in | Wt 131.0 lb

## 2021-05-31 DIAGNOSIS — C4401 Basal cell carcinoma of skin of lip: Secondary | ICD-10-CM | POA: Diagnosis not present

## 2021-05-31 DIAGNOSIS — Z23 Encounter for immunization: Secondary | ICD-10-CM

## 2021-05-31 DIAGNOSIS — E118 Type 2 diabetes mellitus with unspecified complications: Secondary | ICD-10-CM

## 2021-05-31 DIAGNOSIS — I10 Essential (primary) hypertension: Secondary | ICD-10-CM | POA: Diagnosis not present

## 2021-05-31 HISTORY — DX: Basal cell carcinoma of skin of lip: C44.01

## 2021-05-31 LAB — POCT GLYCOSYLATED HEMOGLOBIN (HGB A1C): Hemoglobin A1C: 5.9 % — AB (ref 4.0–5.6)

## 2021-05-31 NOTE — Progress Notes (Signed)
Date:  05/31/2021   Name:  Lori Harrell   DOB:  12-04-1940   MRN:  [id number]   Chief Complaint: Diabetes (Last BS 116 this morning ), Hypertension, and Flu Vaccine  Diabetes She presents for her follow-up diabetic visit. She has type 2 diabetes mellitus. Her disease course has been stable. Pertinent negatives for hypoglycemia include no headaches or tremors. Pertinent negatives for diabetes include no chest pain, no fatigue, no polydipsia and no polyuria. Symptoms are stable. Current diabetic treatment includes oral agent (monotherapy). Her weight is stable. There is no change in her home blood glucose trend. Her breakfast blood glucose is taken between 6-7 am. Her breakfast blood glucose range is generally 110-130 mg/dl. An ACE inhibitor/angiotensin II receptor blocker is being taken.  Hypertension This is a chronic problem. The problem is controlled. Pertinent negatives include no chest pain, headaches, palpitations or shortness of breath. Past treatments include ACE inhibitors.  New BCCA on upper lip - has surgery next month.  Lab Results  Component Value Date   CREATININE 0.83 12/12/2020   BUN 14 12/12/2020   NA 139 12/12/2020   K 4.6 12/12/2020   CL 101 12/12/2020   CO2 21 12/12/2020   Lab Results  Component Value Date   CHOL 157 12/12/2020   HDL 51 12/12/2020   LDLCALC 91 12/12/2020   TRIG 76 12/12/2020   CHOLHDL 3.1 12/12/2020   Lab Results  Component Value Date   TSH 6.330 (H) 12/12/2020   Lab Results  Component Value Date   HGBA1C 5.9 (A) 05/31/2021   Lab Results  Component Value Date   WBC 7.6 12/12/2020   HGB 12.3 12/12/2020   HCT 37.9 12/12/2020   MCV 93 12/12/2020   PLT 248 12/12/2020   Lab Results  Component Value Date   ALT 21 12/12/2020   AST 28 12/12/2020   ALKPHOS 66 12/12/2020   BILITOT 0.4 12/12/2020     Review of Systems  Constitutional:  Negative for appetite change, fatigue, fever and unexpected weight change.  HENT:   Negative for tinnitus and trouble swallowing.   Eyes:  Negative for visual disturbance.  Respiratory:  Negative for cough, chest tightness and shortness of breath.   Cardiovascular:  Negative for chest pain, palpitations and leg swelling.  Gastrointestinal:  Negative for abdominal pain.  Endocrine: Negative for polydipsia and polyuria.  Genitourinary:  Negative for dysuria and hematuria.  Musculoskeletal:  Negative for arthralgias.  Neurological:  Negative for tremors, numbness and headaches.  Psychiatric/Behavioral:  Negative for dysphoric mood.    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 05/08/2017   Large hiatal hernia 07/04/2016   Sigmoid diverticulitis 07/04/2016   Stress incontinence 07/04/2016   Type II diabetes mellitus with complication ([place]) [id number]   Constipation 10/31/2015   Barrett's esophagus without dysplasia 06/16/2015   Essential (primary) hypertension 06/16/2015   Hyperlipidemia associated with type 2 diabetes mellitus ([place]) 06/16/2015   OP (osteoporosis) 06/16/2015   Hypercalcemia 04/22/2012    No Known Allergies  Past Surgical History:  Procedure Laterality Date   ANTERIOR LATERAL LUMBAR FUSION WITH PERCUTANEOUS SCREW 2 LEVEL N/A 05/19/2019  Procedure: L2-4 LATERAL LUMBAR INTERBODY FUSION (XLIF);  Surgeon: Meade Maw, MD;  Location: [facility] ORS;  Service: Neurosurgery;  Laterality: N/A;   CATARACT EXTRACTION     CHOLECYSTECTOMY, LAPAROSCOPIC  02/05/2019   [facility]   COLONOSCOPY  2007   sigmoid diverticulosis   ENDOSCOPIC RETROGRADE CHOLANGIOPANCREATOGRAPHY (ERCP) WITH PROPOFOL N/A 02/02/2019    Procedure: ENDOSCOPIC RETROGRADE CHOLANGIOPANCREATOGRAPHY (ERCP) WITH PROPOFOL;  Surgeon: Lucilla Lame, MD;  Location: [facility] END[facility];  Service: Endoscopy;  Laterality: N/A;   TONSILLECTOMY     UPPER GASTROINTESTINAL ENDOSCOPY  02/2013   Barrett's - done at [place]      Social History   Tobacco Use   Smoking status: Never   Smokeless tobacco: Never   Tobacco comments:    smoking cessation materials not required  Vaping Use   Vaping Use: Never used  Substance Use Topics   Alcohol use: No    Alcohol/week: 0.0 standard drinks   Drug use: No     Medication list has been reviewed and updated.  Current Meds  Medication Sig   ASPERCREME LIDOCAINE EX Apply 1 application topically 3 (three) times daily as needed (pain.).   atorvastatin (LIPITOR) 20 MG tablet TAKE 1 TABLET DAILY   dorzolamide (TRUSOPT) 2 % ophthalmic solution 1 drop 3 (three) times daily.   dorzolamide-timolol (COSOPT) 22.3-6.8 MG/ML ophthalmic solution    enalapril (VASOTEC) 20 MG tablet TAKE 1 TABLET DAILY   esomeprazole (NEXIUM) 40 MG capsule Take 40 mg by mouth 2 (two) times daily.   FREESTYLE LITE test strip USE TO TEST BLOOD SUGAR DAILY   Lancets (FREESTYLE) lancets USE 1 LANCET DAILY   latanoprost (XALATAN) 0.005 % ophthalmic solution Place 1 drop into both eyes at bedtime.   Menthol, Topical Analgesic, (BIOFREEZE) 10 % LIQD Apply topically. biofreeze   metFORMIN (GLUCOPHAGE) 500 MG tablet Take 1 tablet (500 mg total) by mouth daily.   Multiple Vitamin (MULTIVITAMIN WITH MINERALS) TABS tablet Take 1 tablet by mouth every evening. Women's Centrum Silver   naproxen (NAPROSYN) 500 MG tablet Take 500 mg by mouth 2 (two) times daily with a meal.   polyethylene glycol (MIRALAX / GLYCOLAX) 17 g packet Take 17 g by mouth at bedtime as needed (constipation.).   spironolactone (ALDACTONE) 25 MG tablet TAKE 1 TABLET DAILY   timolol (TIMOPTIC) 0.5 % ophthalmic solution Place 1 drop into the left eye  daily.    [DISCONTINUED] Calcium Carbonate-Vit D-Min (CALCIUM 1200 PO) Take 1 tablet by mouth 3 (three) times a week. At bedtime.    PHQ 2/9 Scores 05/31/2021 12/12/2020 08/28/2020 07/17/2020  PHQ - 2 Score 0 0 0 0  PHQ- 9 Score 2 0 0 -    GAD 7 : Generalized Anxiety Score 05/31/2021 12/12/2020 08/28/2020 04/10/2020  Nervous, Anxious, on Edge 0 0 0 0  Control/stop worrying 0 0 0 0  Worry too much - different things 0 0 0 0  Trouble relaxing 0 0 0 0  Restless 0 0 0 0  Easily annoyed or irritable 0 0 0 0  Afraid - awful might happen 0 0 0 0  Total GAD 7 Score 0 0 0 0  Anxiety Difficulty - Not difficult at all Not difficult at all Not difficult at all    BP Readings from Last 3 Encounters:  05/31/21 140/82  12/12/20 128/72  08/28/20 134/78    Physical Exam Vitals and nursing note reviewed.  Constitutional:      General: She is not in acute  distress.    Appearance: She is well-developed.  HENT:     Head: Normocephalic and atraumatic.  Cardiovascular:     Rate and Rhythm: Normal rate and regular rhythm.     Pulses: Normal pulses.  Pulmonary:     Effort: Pulmonary effort is normal. No respiratory distress.     Breath sounds: No wheezing or rhonchi.  Musculoskeletal:     Cervical back: Normal range of motion.     Right lower leg: No edema.     Left lower leg: No edema.  Lymphadenopathy:     Cervical: No cervical adenopathy.  Skin:    General: Skin is warm and dry.     Findings: No rash.  Neurological:     Mental Status: She is alert and oriented to person, place, and time.  Psychiatric:        Mood and Affect: Mood normal.        Behavior: Behavior normal.    Wt Readings from Last 3 Encounters:  05/31/21 131 lb (59.4 kg)  12/12/20 134 lb (60.8 kg)  08/28/20 132 lb (59.9 kg)    BP 140/82   Pulse 63   Temp 97.9 F (36.6 C) (Oral)   Ht '5\' 1"'$  (1.549 m)   Wt 131 lb (59.4 kg)   SpO2 100%   BMI 24.75 kg/m   Assessment and Plan: 1. Type II diabetes mellitus with  complication (HCC) Clinically stable by exam and report without s/s of hypoglycemia. DM complicated by hypertension and dyslipidemia. Tolerating medications well without side effects or other concerns. - POCT glycosylated hemoglobin (Hb A1C) = 5.9  2. Essential (primary) hypertension Clinically stable exam with well controlled BP. Tolerating medications without side effects at this time. Pt to continue current regimen and low sodium diet; benefits of regular exercise as able discussed.  3. 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 Need for immunization against influenza - Flu Vaccine QUAD High Dose(Fluad)   Partially dictated using Editor, commissioning. Any errors are unintentional.  Halina Maidens, MD [facility]  05/31/2021

## 2021-07-02 ENCOUNTER — Other Ambulatory Visit: Payer: Self-pay | Admitting: Internal Medicine

## 2021-07-02 DIAGNOSIS — I10 Essential (primary) hypertension: Secondary | ICD-10-CM

## 2021-07-09 ENCOUNTER — Other Ambulatory Visit: Payer: Self-pay | Admitting: Internal Medicine

## 2021-07-12 DIAGNOSIS — C4401 Basal cell carcinoma of skin of lip: Secondary | ICD-10-CM | POA: Diagnosis not present

## 2021-07-13 ENCOUNTER — Other Ambulatory Visit: Payer: Self-pay | Admitting: Internal Medicine

## 2021-07-13 NOTE — Telephone Encounter (Signed)
  Requested medications are on the active medication list yes  Last visit 05/31/21  Future visit scheduled 12/14/21  Notes to clinic Historical Provider, please assess.  Requested Prescriptions  Pending Prescriptions Disp Refills   esomeprazole (NEXIUM) 40 MG capsule      Sig: Take 1 capsule (40 mg total) by mouth 2 (two) times daily.     Gastroenterology: Proton Pump Inhibitors Passed - 07/13/2021  2:45 PM      Passed - Valid encounter within last 12 months    Recent Outpatient Visits           1 month ago Type II diabetes mellitus with complication [facility])   [facility] Glean Hess, MD   7 months ago Essential (primary) hypertension   [facility] Glean Hess, MD   10 months ago Type II diabetes mellitus with complication [facility])   [facility] Glean Hess, MD   1 year ago Type II diabetes mellitus with complication [facility])   [facility] Glean Hess, MD   1 year ago Essential (primary) hypertension   [facility] Glean Hess, MD       Future Appointments             In 5 months Army Melia Jesse Sans, MD [facility], [facility]

## 2021-07-13 NOTE — Telephone Encounter (Signed)
PATIENT IS OUT PLEASE SEND SHORT SUPPLY sent to [facility]  [place] [id number] - [address], [address] AT [place] Phone:  [phone]  Fax:  [phone]    /Medication Refill - Medication: esomeprazole ([place]) 40 MG capsule   Has the patient contacted their pharmacy? yes (Agent: If no, request that the patient contact the pharmacy for the refill.) (Agent: If yes, when and what did the pharmacy advise?)contact pcp  Preferred Pharmacy (with phone number or street name):  [place], [place]  Phone:  [phone] Fax:  [phone] Has the patient been seen for an appointment in the last year OR does the patient have an upcoming appointment? yes  Agent: Please be advised that RX refills may take up to 3 business days. We ask that you follow-up with your pharmacy.

## 2021-07-17 ENCOUNTER — Other Ambulatory Visit: Payer: Self-pay | Admitting: Internal Medicine

## 2021-07-17 MED ORDER — ESOMEPRAZOLE MAGNESIUM 40 MG PO CPDR: 40.0000 mg | DELAYED_RELEASE_CAPSULE | Freq: Two times a day (BID) | ORAL | 1 refills | Status: DC

## 2021-07-17 NOTE — Telephone Encounter (Signed)
Requested medication (s) are due for refill today: Yes  Requested medication (s) are on the active medication list: Yes  Last refill:  06/10/20  Future visit scheduled: Yes  Notes to clinic:  Prescription expired.    Requested Prescriptions  Pending Prescriptions Disp Refills   FREESTYLE LITE test strip [Pharmacy Med Name: FREESTYLE LITE STRIPS 50'S] 100 strip 3    Sig: USE TO TEST BLOOD SUGAR DAILY     Endocrinology: Diabetes - Testing Supplies Passed - 07/17/2021  1:02 PM      Passed - Valid encounter within last 12 months    Recent Outpatient Visits           1 month ago Type II diabetes mellitus with complication [facility])   [facility] Glean Hess, MD   7 months ago Essential (primary) hypertension   [facility] Glean Hess, MD   10 months ago Type II diabetes mellitus with complication [facility])   [facility] Glean Hess, MD   1 year ago Type II diabetes mellitus with complication [facility])   [facility] Glean Hess, MD   1 year ago Essential (primary) hypertension   [facility] Glean Hess, MD       Future Appointments             In 5 months Army Melia Jesse Sans, MD [facility], [facility]

## 2021-07-18 ENCOUNTER — Ambulatory Visit (INDEPENDENT_AMBULATORY_CARE_PROVIDER_SITE_OTHER): Payer: Medicare Other

## 2021-07-18 DIAGNOSIS — Z0000 Encounter for general adult medical examination without abnormal findings: Secondary | ICD-10-CM

## 2021-07-18 NOTE — Patient Instructions (Signed)
[name] , Thank you for taking time to come for your Medicare Wellness Visit. I appreciate your ongoing commitment to your health goals. Please review the following plan we discussed and let me know if I can assist you in the future.   Screening recommendations/referrals: Colonoscopy: no longer required Mammogram: no longer required Bone Density: no longer required Recommended yearly ophthalmology/optometry visit for glaucoma screening and checkup Recommended yearly dental visit for hygiene and checkup  Vaccinations: Influenza vaccine: done 05/31/21 Pneumococcal vaccine: done 06/20/14 Tdap vaccine: due Shingles vaccine: Shingrix discussed. Please contact your pharmacy for coverage information.  Covid-19: done 12/24/19, 01/14/20 & 10/13/20  Conditions/risks identified: Keep up the great work!  Next appointment: Follow up in one year for your annual wellness visit    Preventive Care 65 Years and Older, Female Preventive care refers to lifestyle choices and visits with your health care provider that can promote health and wellness. What does preventive care include? A yearly physical exam. This is also called an annual well check. Dental exams once or twice a year. Routine eye exams. Ask your health care provider how often you should have your eyes checked. Personal lifestyle choices, including: Daily care of your teeth and gums. Regular physical activity. Eating a healthy diet. Avoiding tobacco and drug use. Limiting alcohol use. Practicing safe sex. Taking low-dose aspirin every day. Taking vitamin and mineral supplements as recommended by your health care provider. What happens during an annual well check? The services and screenings done by your health care provider during your annual well check will depend on your age, overall health, lifestyle risk factors, and family history of disease. Counseling  Your health care provider may ask you questions about your: Alcohol  use. Tobacco use. Drug use. Emotional well-being. Home and relationship well-being. Sexual activity. Eating habits. History of falls. Memory and ability to understand (cognition). Work and work Statistician. Reproductive health. Screening  You may have the following tests or measurements: Height, weight, and BMI. Blood pressure. Lipid and cholesterol levels. These may be checked every 5 years, or more frequently if you are over 35 years old. Skin check. Lung cancer screening. You may have this screening every year starting at age 79 if you have a 30-pack-year history of smoking and currently smoke or have quit within the past 15 years. Fecal occult blood test (FOBT) of the stool. You may have this test every year starting at age 7. Flexible sigmoidoscopy or colonoscopy. You may have a sigmoidoscopy every 5 years or a colonoscopy every 10 years starting at age 55. Hepatitis C blood test. Hepatitis B blood test. Sexually transmitted disease (STD) testing. Diabetes screening. This is done by checking your blood sugar (glucose) after you have not eaten for a while (fasting). You may have this done every 1-3 years. Bone density scan. This is done to screen for osteoporosis. You may have this done starting at age 13. Mammogram. This may be done every 1-2 years. Talk to your health care provider about how often you should have regular mammograms. Talk with your health care provider about your test results, treatment options, and if necessary, the need for more tests. Vaccines  Your health care provider may recommend certain vaccines, such as: Influenza vaccine. This is recommended every year. Tetanus, diphtheria, and acellular pertussis (Tdap, Td) vaccine. You may need a Td booster every 10 years. Zoster vaccine. You may need this after age 60. Pneumococcal 13-valent conjugate (PCV13) vaccine. One dose is recommended after age 71. Pneumococcal polysaccharide (PPSV23)  vaccine. One dose is  recommended after age 41. Talk to your health care provider about which screenings and vaccines you need and how often you need them. This information is not intended to replace advice given to you by your health care provider. Make sure you discuss any questions you have with your health care provider. Document Released: 10/13/2015 Document Revised: 06/05/2016 Document Reviewed: 07/18/2015 Elsevier Interactive Patient Education  2017 [employer] Prevention in the Home Falls can cause injuries. They can happen to people of all ages. There are many things you can do to make your home safe and to help prevent falls. What can I do on the outside of my home? Regularly fix the edges of walkways and driveways and fix any cracks. Remove anything that might make you trip as you walk through a door, such as a raised step or threshold. Trim any bushes or trees on the path to your home. Use bright outdoor lighting. Clear any walking paths of anything that might make someone trip, such as rocks or tools. Regularly check to see if handrails are loose or broken. Make sure that both sides of any steps have handrails. Any raised decks and porches should have guardrails on the edges. Have any leaves, snow, or ice cleared regularly. Use sand or salt on walking paths during winter. Clean up any spills in your garage right away. This includes oil or grease spills. What can I do in the bathroom? Use night lights. Install grab bars by the toilet and in the tub and shower. Do not use towel bars as grab bars. Use non-skid mats or decals in the tub or shower. If you need to sit down in the shower, use a plastic, non-slip stool. Keep the floor dry. Clean up any water that spills on the floor as soon as it happens. Remove soap buildup in the tub or shower regularly. Attach bath mats securely with double-sided non-slip rug tape. Do not have throw rugs and other things on the floor that can make you  trip. What can I do in the bedroom? Use night lights. Make sure that you have a light by your bed that is easy to reach. Do not use any sheets or blankets that are too big for your bed. They should not hang down onto the floor. Have a firm chair that has side arms. You can use this for support while you get dressed. Do not have throw rugs and other things on the floor that can make you trip. What can I do in the kitchen? Clean up any spills right away. Avoid walking on wet floors. Keep items that you use a lot in easy-to-reach places. If you need to reach something above you, use a strong step stool that has a grab bar. Keep electrical cords out of the way. Do not use floor polish or wax that makes floors slippery. If you must use wax, use non-skid floor wax. Do not have throw rugs and other things on the floor that can make you trip. What can I do with my stairs? Do not leave any items on the stairs. Make sure that there are handrails on both sides of the stairs and use them. Fix handrails that are broken or loose. Make sure that handrails are as long as the stairways. Check any carpeting to make sure that it is firmly attached to the stairs. Fix any carpet that is loose or worn. Avoid having throw rugs at the top or bottom  of the stairs. If you do have throw rugs, attach them to the floor with carpet tape. Make sure that you have a light switch at the top of the stairs and the bottom of the stairs. If you do not have them, ask someone to add them for you. What else can I do to help prevent falls? Wear shoes that: Do not have high heels. Have rubber bottoms. Are comfortable and fit you well. Are closed at the toe. Do not wear sandals. If you use a stepladder: Make sure that it is fully opened. Do not climb a closed stepladder. Make sure that both sides of the stepladder are locked into place. Ask someone to hold it for you, if possible. Clearly mark and make sure that you can  see: Any grab bars or handrails. First and last steps. Where the edge of each step is. Use tools that help you move around (mobility aids) if they are needed. These include: Canes. Walkers. Scooters. Crutches. Turn on the lights when you go into a dark area. Replace any light bulbs as soon as they burn out. Set up your furniture so you have a clear path. Avoid moving your furniture around. If any of your floors are uneven, fix them. If there are any pets around you, be aware of where they are. Review your medicines with your doctor. Some medicines can make you feel dizzy. This can increase your chance of falling. Ask your doctor what other things that you can do to help prevent falls. This information is not intended to replace advice given to you by your health care provider. Make sure you discuss any questions you have with your health care provider. Document Released: 07/13/2009 Document Revised: 02/22/2016 Document Reviewed: 10/21/2014 Elsevier Interactive Patient Education  2017 [employer].

## 2021-07-18 NOTE — Progress Notes (Signed)
Subjective:   Lori Harrell is a 80 y.o. female who presents for Medicare Annual (Subsequent) preventive examination.  Virtual Visit via Telephone Note  I connected with  [name] on 07/18/21 at  8:40 AM EDT by telephone and verified that I am speaking with the correct person using two identifiers.  Location: Patient: home Provider: [facility] Persons participating in the virtual visit: [place]   I discussed the limitations, risks, security and privacy concerns of performing an evaluation and management service by telephone and the availability of in person appointments. The patient expressed understanding and agreed to proceed.  Interactive audio and video telecommunications were attempted between this nurse and patient, however failed, due to patient having technical difficulties OR patient did not have access to video capability.  We continued and completed visit with audio only.  Some vital signs may be absent or patient reported.   Clemetine Marker, LPN  Review of Systems     Cardiac Risk Factors include: advanced age (>37men, >34 women);diabetes mellitus;dyslipidemia;hypertension     Objective:    There were no vitals filed for this visit. There is no height or weight on file to calculate BMI.  Advanced Directives 07/18/2021 07/17/2020 07/14/2019 05/19/2019 05/10/2019 02/01/2019 02/01/2019  Does Patient Have a Medical Advance Directive? Yes Yes Yes Yes Yes Yes No  Type of Paramedic of [place];Living will [place];Living will [place];Living will [place];Living will [place];Living will [place];Living will -  Does patient want to make changes to medical advance directive? - - - No - Patient declined - No - Patient declined -  Copy of [place] in Chart? Yes - validated most recent copy scanned in chart  (See row information) Yes - validated most recent copy scanned in chart (See row information) Yes - validated most recent copy scanned in chart (See row information) No - copy requested - No - copy requested -  Would patient like information on creating a medical advance directive? - - - - - - -    Current Medications (verified) Outpatient Encounter Medications as of 07/18/2021  Medication Sig   ASPERCREME LIDOCAINE EX Apply 1 application topically 3 (three) times daily as needed (pain.).   atorvastatin (LIPITOR) 20 MG tablet TAKE 1 TABLET DAILY   dorzolamide (TRUSOPT) 2 % ophthalmic solution 1 drop 3 (three) times daily.   enalapril (VASOTEC) 20 MG tablet TAKE 1 TABLET DAILY   esomeprazole (NEXIUM) 40 MG capsule Take 1 capsule (40 mg total) by mouth 2 (two) times daily.   FREESTYLE LITE test strip USE TO TEST BLOOD SUGAR DAILY   Lancets (FREESTYLE) lancets USE 1 LANCET DAILY   latanoprost (XALATAN) 0.005 % ophthalmic solution Place 1 drop into both eyes at bedtime.   Menthol, Topical Analgesic, (BIOFREEZE) 10 % LIQD Apply topically. biofreeze   metFORMIN (GLUCOPHAGE) 500 MG tablet Take 1 tablet (500 mg total) by mouth daily.   Multiple Vitamin (MULTIVITAMIN WITH MINERALS) TABS tablet Take 1 tablet by mouth every evening. Women's Centrum Silver   spironolactone (ALDACTONE) 25 MG tablet TAKE 1 TABLET DAILY   timolol (TIMOPTIC) 0.5 % ophthalmic solution Place 1 drop into the left eye daily.    [DISCONTINUED] dorzolamide-timolol (COSOPT) 22.3-6.8 MG/ML ophthalmic solution    [DISCONTINUED] naproxen (NAPROSYN) 500 MG tablet Take 500 mg by mouth 2 (two) times daily with a meal.   [DISCONTINUED] polyethylene glycol (MIRALAX / GLYCOLAX)  17 g packet Take 17 g by mouth at bedtime as needed (constipation.).   No facility-administered encounter medications on file as of 07/18/2021.    Allergies (verified) Patient has no known allergies.   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   Basal cell carcinoma (BCC) of skin of lip 05/31/2021   Choledocholithiasis    Diabetes mellitus without complication (HCC)    Elevated lipids    Glaucoma    History of detached retina repair    Hypertension    Unstageable pressure ulcer of back ([place]) 01/07/2019   Past Surgical History:  Procedure Laterality Date   ANTERIOR LATERAL LUMBAR FUSION WITH PERCUTANEOUS SCREW 2 LEVEL N/A 05/19/2019   Procedure: L2-4 LATERAL LUMBAR INTERBODY FUSION (XLIF);  Surgeon: Meade Maw, MD;  Location: [facility] ORS;  Service: Neurosurgery;  Laterality: N/A;   CATARACT EXTRACTION     CHOLECYSTECTOMY, LAPAROSCOPIC  02/05/2019   [facility]   COLONOSCOPY  2007   sigmoid diverticulosis   ENDOSCOPIC RETROGRADE CHOLANGIOPANCREATOGRAPHY (ERCP) WITH PROPOFOL N/A 02/02/2019   Procedure: ENDOSCOPIC RETROGRADE CHOLANGIOPANCREATOGRAPHY (ERCP) WITH PROPOFOL;  Surgeon: Lucilla Lame, MD;  Location: [facility] ENDOSCOPY;  Service: Endoscopy;  Laterality: N/A;   TONSILLECTOMY     UPPER GASTROINTESTINAL ENDOSCOPY  02/2013   [facility] - done at [facility]   VAGINAL HYSTERECTOMY     Family History  Problem Relation Age of Onset   Diabetes Father    CAD Father    Breast cancer Neg Hx    Social History   Socioeconomic History   Marital status: Widowed    Spouse name: Not on file   Number of children: 2   Years of education: Not on file   Highest education level: 12th grade  Occupational History   Occupation: Retired  Tobacco Use   Smoking status: Never   Smokeless tobacco: Never   Tobacco comments:    smoking cessation materials not required  Vaping Use   Vaping Use: Never used  Substance and Sexual Activity   Alcohol use: No    Alcohol/week: 0.0 standard drinks   Drug use: No   Sexual activity: Not Currently  Other Topics Concern   Not on file  Social History Narrative   Not on file   Social Determinants of Health    Financial Resource Strain: Low Risk    Difficulty of Paying Living Expenses: Not very hard  Food Insecurity: No Food Insecurity   Worried About Charity fundraiser in the Last Year: Never true   Ran Out of Food in the Last Year: Never true  Transportation Needs: No Transportation Needs   Lack of Transportation (Medical): No   Lack of Transportation (Non-Medical): No  Physical Activity: Sufficiently Active   Days of Exercise per Week: 5 days   Minutes of Exercise per Session: 30 min  Stress: No Stress Concern Present   Feeling of Stress : Not at all  Social Connections: Moderately Isolated   Frequency of Communication with Friends and Family: More than three times a week   Frequency of Social Gatherings with Friends and Family: More than three times a week   Attends Religious Services: More than 4 times per year   Active Member of [employer] or Organizations: No   Attends Archivist Meetings: Never   Marital Status: Widowed    Tobacco Counseling Counseling given: Not Answered Tobacco comments: smoking cessation materials  not required   Clinical Intake:  Pre-visit preparation completed: Yes  Pain : No/denies pain     Nutritional Risks: None Diabetes: Yes CBG done?: No Did pt. bring in CBG monitor from home?: No  How often do you need to have someone help you when you read instructions, pamphlets, or other written materials from your doctor or pharmacy?: 1 - Never  Nutrition Risk Assessment:  Has the patient had any N/V/D within the last 2 months?  No  Does the patient have any non-healing wounds?  No  Has the patient had any unintentional weight loss or weight gain?  No   Diabetes:  Is the patient diabetic?  Yes  If diabetic, was a CBG obtained today?  No  Did the patient bring in their glucometer from home?  No  How often do you monitor your CBG's? daily.   Financial Strains and Diabetes Management:  Are you having any financial strains with the  device, your supplies or your medication? No .  Does the patient want to be seen by Chronic Care Management for management of their diabetes?  No  Would the patient like to be referred to a Nutritionist or for Diabetic Management?  No   Diabetic Exams:  Diabetic Eye Exam: Completed 02/06/21 negative retinopathy.   Diabetic Foot Exam: Completed 12/12/20.   Interpreter Needed?: No  Information entered by :: Clemetine Marker LPN   Activities of Daily Living In your present state of health, do you have any difficulty performing the following activities: 07/18/2021 05/31/2021  Hearing? N N  Vision? N N  Difficulty concentrating or making decisions? N N  Walking or climbing stairs? Y Y  Dressing or bathing? N N  Doing errands, shopping? N N  Preparing Food and eating ? N -  Using the Toilet? N -  In the past six months, have you accidently leaked urine? N -  Do you have problems with loss of bowel control? N -  Managing your Medications? N -  Managing your Finances? N -  Housekeeping or managing your Housekeeping? N -  Some recent data might be hidden    Patient Care Team: Glean Hess, MD as PCP - General (Internal Medicine) Florinda Marker Early Chars, MD as Consulting Physician (Ophthalmology) Malissa Hippo, Gaspar Skeeters, MD as Consulting Physician (Gastroenterology) Lovell Sheehan, MD as Consulting Physician (Orthopedic Surgery) Meade Maw, MD as Consulting Physician (Neurosurgery)  Indicate any recent [place] you may have received from other than [facility] providers in the past year (date may be approximate).     Assessment:   This is a routine wellness examination for Kim[place].  Hearing/Vision screen Hearing Screening - Comments:: Pt denies hearing difficulty Vision Screening - Comments:: Annual vision screenings done by Dr. Guerry Bruin at [facility] in [place]  Dietary issues and exercise activities discussed: Current Exercise Habits: Home exercise routine, Type of  exercise: walking, Time (Minutes): 30, Frequency (Times/Week): 5, Weekly Exercise (Minutes/Week): 150, Intensity: Moderate, Exercise limited by: None identified   Goals Addressed   None    Depression Screen PHQ 2/9 Scores 07/18/2021 05/31/2021 12/12/2020 08/28/2020 07/17/2020 04/10/2020 12/10/2019  PHQ - 2 Score 0 0 0 0 0 0 0  PHQ- 9 Score 2 2 0 0 - 0 1    Fall Risk Fall Risk  07/18/2021 05/31/2021 12/12/2020 08/28/2020 07/17/2020  Falls in the past year? 0 0 0 0 0  Comment - - - - -  Number falls in past yr: 0 0 0 0  0  Injury with Fall? 0 0 0 0 0  Risk for fall due to : No Fall Risks No Fall Risks - - Impaired balance/gait  Risk for fall due to: Comment - - - - -  Follow up Falls prevention discussed Falls evaluation completed Falls evaluation completed Falls evaluation completed Falls prevention discussed    FALL RISK PREVENTION PERTAINING TO THE HOME:  Any stairs in or around the home? No  If so, are there any without handrails? No  Home free of loose throw rugs in walkways, pet beds, electrical cords, etc? Yes  Adequate lighting in your home to reduce risk of falls? Yes   ASSISTIVE DEVICES UTILIZED TO PREVENT FALLS:  Life alert? No  Use of a cane, walker or w/c? Yes  Grab bars in the bathroom? Yes  Shower chair or bench in shower? Yes  Elevated toilet seat or a handicapped toilet? Yes   TIMED UP AND GO:  Was the test performed? No . Telephonic visit.   Cognitive Function: Normal cognitive status assessed by direct observation by this Nurse Health Advisor. No abnormalities found.       6CIT Screen 07/17/2020 07/14/2019 07/08/2018 07/07/2017 07/04/2016  What Year? 0 points 0 points 0 points 0 points 0 points  What month? 0 points 0 points 0 points 0 points 0 points  What time? 0 points 0 points 0 points 0 points 0 points  Count back from 20 0 points 0 points 0 points 0 points 0 points  Months in reverse 0 points 0 points 0 points 0 points 0 points  Repeat phrase 0 points 2  points 0 points 0 points 0 points  Total Score 0 2 0 0 0    Immunizations Immunization History  Administered Date(s) Administered   Fluad Quad(high Dose 65+) 08/12/2019, 08/28/2020, 05/31/2021   Influenza, High Dose Seasonal PF 07/07/2017, 07/08/2018   Influenza, Seasonal, Injecte, Preservative Fre 08/19/2013   Influenza,inj,Quad PF,6+ Mos 06/23/2015, 07/04/2016   Influenza-Unspecified 06/18/2011, 08/21/2012   PFIZER(Purple Top)SARS-COV-2 Vaccination 12/24/2019, 01/14/2020, 10/13/2020   Pneumococcal Conjugate-13 06/20/2014   Pneumococcal Polysaccharide-23 10/02/2007   Tdap 10/01/2010    TDAP status: Due, Education has been provided regarding the importance of this vaccine. Advised may receive this vaccine at local pharmacy or Health Dept. Aware to provide a copy of the vaccination record if obtained from local pharmacy or Health Dept. Verbalized acceptance and understanding.  Flu Vaccine status: Up to date  Pneumococcal vaccine status: Up to date  Covid-19 vaccine status: Completed vaccines  Qualifies for Shingles Vaccine? Yes   Zostavax completed No   Shingrix Completed?: No.    Education has been provided regarding the importance of this vaccine. Patient has been advised to call insurance company to determine out of pocket expense if they have not yet received this vaccine. Advised may also receive vaccine at local pharmacy or Health Dept. Verbalized acceptance and understanding.  Screening Tests Health Maintenance  Topic Date Due   Zoster Vaccines- Shingrix (1 of 2) Never done   COVID-19 Vaccine (4 - Booster for Pfizer series) 01/05/2021   TETANUS/TDAP  12/12/2021 (Originally 10/01/2020)   HEMOGLOBIN A1C  11/28/2021   FOOT EXAM  12/12/2021   OPHTHALMOLOGY EXAM  02/06/2022   INFLUENZA VACCINE  Completed   DEXA SCAN  Completed   HPV VACCINES  Aged Out    Health Maintenance  Health Maintenance Due  Topic Date Due   Zoster Vaccines- Shingrix (1 of 2) Never done    COVID-19 Vaccine (  4 - Booster for [employer] series) 01/05/2021    Colorectal cancer screening: No longer required.   Mammogram status: No longer required due to age.  Bone density status: no longer required due to age  Lung Cancer Screening: (Low Dose CT Chest recommended if Age 19-80 years, 30 pack-year currently smoking OR have quit w/in 15years.) does not qualify.   Additional Screening:  Hepatitis C Screening: does not qualify  Vision Screening: Recommended annual ophthalmology exams for early detection of glaucoma and other disorders of the eye. Is the patient up to date with their annual eye exam?  Yes  Who is the provider or what is the name of the office in which the patient attends annual eye exams? [facility].   Dental Screening: Recommended annual dental exams for proper oral hygiene  Community Resource Referral / Chronic Care Management: CRR required this visit?  No   CCM required this visit?  No      Plan:     I have personally reviewed and noted the following in the patient's chart:   Medical and social history Use of alcohol, tobacco or illicit drugs  Current medications and supplements including opioid prescriptions.  Functional ability and status Nutritional status Physical activity Advanced directives List of other physicians Hospitalizations, surgeries, and ER visits in previous 12 months Vitals Screenings to include cognitive, depression, and falls Referrals and appointments  In addition, I have reviewed and discussed with patient certain preventive protocols, quality metrics, and best practice recommendations. A written personalized care plan for preventive services as well as general preventive health recommendations were provided to patient.     Clemetine Marker, LPN   [id number]   Nurse Notes: none

## 2021-08-15 ENCOUNTER — Other Ambulatory Visit: Payer: Self-pay | Admitting: Internal Medicine

## 2021-08-15 MED ORDER — ESOMEPRAZOLE MAGNESIUM 40 MG PO CPDR: 40.0000 mg | DELAYED_RELEASE_CAPSULE | Freq: Two times a day (BID) | ORAL | 1 refills | Status: DC

## 2021-08-15 NOTE — Telephone Encounter (Signed)
Copied from [place] [id number]. Topic: Quick Communication - Rx Refill/Question >> Aug 15, 2021 11:16 AM Tessa Lerner A wrote: Medication: esomeprazole (NEXIUM) 40 MG capsule [[id number]]   Has the patient contacted their pharmacy? Yes.  The patient was directed to contact their PCP (Agent: If no, request that the patient contact the pharmacy for the refill. If patient does not wish to contact the pharmacy document the reason why and proceed with request.) (Agent: If yes, when and what did the pharmacy advise?)  Preferred Pharmacy (with phone number or street name): [place], [place]  Phone:  [phone] Fax:  [phone]  Has the patient been seen for an appointment in the last year OR does the patient have an upcoming appointment? Yes.    Agent: Please be advised that RX refills may take up to 3 business days. We ask that you follow-up with your pharmacy.

## 2021-08-15 NOTE — Telephone Encounter (Signed)
Requested Prescriptions  Pending Prescriptions Disp Refills  . esomeprazole (NEXIUM) 40 MG capsule 180 capsule 1    Sig: Take 1 capsule (40 mg total) by mouth 2 (two) times daily.     Gastroenterology: Proton Pump Inhibitors Passed - 08/15/2021 12:25 PM      Passed - Valid encounter within last 12 months    Recent Outpatient Visits          2 months ago Type II diabetes mellitus with complication [facility])   [facility] Glean Hess, MD   8 months ago Essential (primary) hypertension   [facility] Glean Hess, MD   11 months ago Type II diabetes mellitus with complication [facility])   [facility] Glean Hess, MD   1 year ago Type II diabetes mellitus with complication [facility])   [facility] Glean Hess, MD   1 year ago Essential (primary) hypertension   [facility] Glean Hess, MD      Future Appointments            In 4 months Army Melia Jesse Sans, MD [facility], [facility]

## 2021-08-23 DIAGNOSIS — Z20828 Contact with and (suspected) exposure to other viral communicable diseases: Secondary | ICD-10-CM | POA: Diagnosis not present

## 2021-10-01 ENCOUNTER — Other Ambulatory Visit: Payer: Self-pay | Admitting: Internal Medicine

## 2021-10-01 DIAGNOSIS — E1169 Type 2 diabetes mellitus with other specified complication: Secondary | ICD-10-CM

## 2021-10-02 NOTE — Telephone Encounter (Signed)
Requested Prescriptions  Pending Prescriptions Disp Refills   atorvastatin (LIPITOR) 20 MG tablet [Pharmacy Med Name: ATORVASTATIN TABS 20MG ] 90 tablet 2    Sig: TAKE 1 TABLET DAILY     Cardiovascular:  Antilipid - Statins Passed - 10/01/2021  2:28 AM      Passed - Total Cholesterol in normal range and within 360 days    Cholesterol, Total  Date Value Ref Range Status  12/12/2020 157 100 - 199 mg/dL Final         Passed - LDL in normal range and within 360 days    LDL Chol Calc (NIH)  Date Value Ref Range Status  12/12/2020 91 0 - 99 mg/dL Final         Passed - HDL in normal range and within 360 days    HDL  Date Value Ref Range Status  12/12/2020 51 >39 mg/dL Final         Passed - Triglycerides in normal range and within 360 days    Triglycerides  Date Value Ref Range Status  12/12/2020 76 0 - 149 mg/dL Final         Passed - Patient is not pregnant      Passed - Valid encounter within last 12 months    Recent Outpatient Visits          4 months ago Type II diabetes mellitus with complication [facility])   [facility] Glean Hess, MD   9 months ago Essential (primary) hypertension   [facility] Glean Hess, MD   1 year ago Type II diabetes mellitus with complication [facility])   [facility] Glean Hess, MD   1 year ago Type II diabetes mellitus with complication [facility])   [facility] Glean Hess, MD   1 year ago Essential (primary) hypertension   [facility] Glean Hess, MD      Future Appointments            In 2 months Army Melia Jesse Sans, MD [facility], [facility]

## 2021-10-17 ENCOUNTER — Emergency Department: Payer: Medicare Other

## 2021-10-17 ENCOUNTER — Other Ambulatory Visit: Payer: Self-pay

## 2021-10-17 ENCOUNTER — Emergency Department
Admission: EM | Admit: 2021-10-17 | Discharge: 2021-10-17 | Disposition: A | Discharge status: Home / Self Care | Payer: Medicare Other | Attending: Emergency Medicine | Admitting: Emergency Medicine

## 2021-10-17 DIAGNOSIS — E119 Type 2 diabetes mellitus without complications: Secondary | ICD-10-CM | POA: Diagnosis not present

## 2021-10-17 DIAGNOSIS — S43014A Anterior dislocation of right humerus, initial encounter: Secondary | ICD-10-CM | POA: Diagnosis not present

## 2021-10-17 DIAGNOSIS — S43004A Unspecified dislocation of right shoulder joint, initial encounter: Secondary | ICD-10-CM

## 2021-10-17 DIAGNOSIS — I10 Essential (primary) hypertension: Secondary | ICD-10-CM | POA: Diagnosis not present

## 2021-10-17 DIAGNOSIS — W101XXA Fall (on)(from) sidewalk curb, initial encounter: Secondary | ICD-10-CM | POA: Insufficient documentation

## 2021-10-17 DIAGNOSIS — S42214A Unspecified nondisplaced fracture of surgical neck of right humerus, initial encounter for closed fracture: Secondary | ICD-10-CM

## 2021-10-17 DIAGNOSIS — R001 Bradycardia, unspecified: Secondary | ICD-10-CM | POA: Diagnosis not present

## 2021-10-17 DIAGNOSIS — R55 Syncope and collapse: Secondary | ICD-10-CM | POA: Insufficient documentation

## 2021-10-17 DIAGNOSIS — M25511 Pain in right shoulder: Secondary | ICD-10-CM | POA: Diagnosis present

## 2021-10-17 DIAGNOSIS — M79603 Pain in arm, unspecified: Secondary | ICD-10-CM | POA: Diagnosis not present

## 2021-10-17 DIAGNOSIS — S42211A Unspecified displaced fracture of surgical neck of right humerus, initial encounter for closed fracture: Secondary | ICD-10-CM | POA: Diagnosis not present

## 2021-10-17 DIAGNOSIS — M7989 Other specified soft tissue disorders: Secondary | ICD-10-CM | POA: Diagnosis not present

## 2021-10-17 DIAGNOSIS — W19XXXA Unspecified fall, initial encounter: Secondary | ICD-10-CM | POA: Diagnosis not present

## 2021-10-17 DIAGNOSIS — R52 Pain, unspecified: Secondary | ICD-10-CM | POA: Diagnosis not present

## 2021-10-17 MED ORDER — SODIUM CHLORIDE 0.9 % IV BOLUS
1000.0000 mL | Freq: Once | INTRAVENOUS | Status: AC
Start: 2021-10-17 — End: 2021-10-17
  Administered 2021-10-17: 1000 mL via INTRAVENOUS

## 2021-10-17 MED ORDER — FENTANYL CITRATE PF 50 MCG/ML IJ SOSY
50.0000 ug | PREFILLED_SYRINGE | Freq: Once | INTRAMUSCULAR | Status: AC
  Administered 2021-10-17: 50 ug via INTRAVENOUS
  Filled 2021-10-17: qty 1

## 2021-10-17 MED ORDER — TRAMADOL HCL 50 MG PO TABS
50.0000 mg | ORAL_TABLET | Freq: Four times a day (QID) | ORAL | 0 refills | Status: DC | PRN
Start: 2021-10-17 — End: 2022-02-06

## 2021-10-17 MED ORDER — ETOMIDATE 2 MG/ML IV SOLN
10.0000 mg | Freq: Once | INTRAVENOUS | Status: AC
  Administered 2021-10-17: 10 mg via INTRAVENOUS
  Filled 2021-10-17: qty 10

## 2021-10-17 NOTE — ED Notes (Signed)
Arm sling applied by provider.

## 2021-10-17 NOTE — ED Notes (Signed)
Assisted up to toilet. Continent of urine. Denied dizziness upon standing and walking. Resp even, unlabored on RA. Assisted back to bed and warm blankets provided per request. Circulation and sensation intact to RUE. Able to move fingers freely. RUE in shoulder immobilizer.

## 2021-10-17 NOTE — ED Triage Notes (Signed)
Pt comes into the ED via [facility] from home c/o fall. Pt states she was stepping over a curb when she fell back.  Pt c/o right arm pain, but no obvious deformity noted.  Pt denies hitting, but had positive LOC, denies any blood thinners.   138/70 60 HR 99% RA 221 CBG

## 2021-10-17 NOTE — ED Provider Notes (Addendum)
[facility] Provider Note    Event Date/Time   First MD Initiated Contact with Patient 10/17/21 1613     (approximate)  History   Chief Complaint: Shoulder Injury and Fall  HPI  Lori Harrell is a 81 y.o. female with a past medical history of gastric reflux, diabetes, hypertension, presents emergency department after a fall with right shoulder pain.  According to the patient and son patient tripped falling down onto her arms.  Patient experienced pain in the right shoulder.  Does not believe she directly hit her right shoulder, did not hit her head.  Patient called EMS, when EMS arrived they try to stand the patient up and she did have a brief loss of consciousness.  Patient states she was in significant pain when they attempted to set her up.    Physical Exam   Triage Vital Signs: ED Triage Vitals [10/17/21 1450]  Enc Vitals Group     BP 118/71     Pulse Rate (!) 55     Resp 16     Temp 97.7 F (36.5 C)     Temp Source Oral     SpO2 96 %     Weight 132 lb (59.9 kg)     Height 5\' 1"  (1.549 m)     Head Circumference      Peak Flow      Pain Score 8     Pain Loc      Pain Edu?      Excl. in [place]?     Most recent vital signs: Vitals:   10/17/21 1450  BP: 118/71  Pulse: (!) 55  Resp: 16  Temp: 97.7 F (36.5 C)  SpO2: 96%    General: Awake, no distress.  No signs of head trauma. CV:  Good peripheral perfusion.  Regular rate and rhythm  Resp:  Normal effort.  Equal breath sounds bilaterally.  Abd:  No distention.  Soft, nontender.  No rebound or guarding. Other:  Patient has significant pain with any attempted range of motion in the right shoulder.  Neuro vastly intact distally with 2+ radial pulse.  Good range of motion in all other extremities without pain elicited.   ED Results / Procedures / Treatments   EKG  EKG viewed and interpreted by myself shows sinus bradycardia 54 bpm with a narrow QRS, normal axis, slight PR  prolongation otherwise normal intervals with no concerning ST changes.  RADIOLOGY  I personally reviewed the shoulder x-ray images appears to have an anterior dislocation. Radiology read confirms anterior glenohumeral dislocation.  Unable to rule out underlying fracture. CT scan head is negative.     MEDICATIONS ORDERED IN ED: Medications  fentaNYL (SUBLIMAZE) injection 50 mcg (has no administration in time range)     IMPRESSION / MDM / ASSESSMENT AND PLAN / ED COURSE  I reviewed the triage vital signs and the nursing notes.  Patient presents to the emergency department for right shoulder pain and a syncopal event.  Syncope likely pain induced when they are attempting to get the patient up.  Patient denies any chest pain or shortness of breath.  Patient does have moderate pain to the right shoulder.  Neuro vastly intact.  X-ray confirms anterior shoulder dislocation.  We will treat with pain medication and attempt to reduce without sedation.  If unsuccessful will consider conscious sedation for reduction.  Patient agreeable to plan.    Attempted to reduce with fentanyl alone.  Although tolerated well  unable to reduce.  We will proceed with conscious sedation.  I spoke to the patient and son they are agreeable to this plan of care.  I verbally consented the patient for the reduction and sedation.    .Sedation  Date/Time: 10/17/2021 4:51 PM Performed by: Harvest Dark, MD Authorized by: Harvest Dark, MD   Consent:    Consent obtained:  Verbal and written   Consent given by:  Patient   Risks discussed:  Dysrhythmia, prolonged sedation necessitating reversal and respiratory compromise necessitating ventilatory assistance and intubation   Alternatives discussed:  Analgesia without sedation Universal protocol:    Immediately prior to procedure, a time out was called: yes     Patient identity confirmed:  Anonymous protocol, patient vented/unresponsive, arm band and verbally with  patient Indications:    Procedure performed:  Dislocation reduction   Procedure necessitating sedation performed by:  Physician performing sedation Pre-sedation assessment:    Time since last food or drink:  Several hours   ASA classification: class 2 - patient with mild systemic disease     Mallampati score:  II - soft palate, uvula, fauces visible   Neck mobility: normal     Pre-sedation assessments completed and reviewed: airway patency, hydration status, mental status, pain level and respiratory function     Pre-sedation assessment completed:  10/17/2021 4:52 PM Immediate pre-procedure details:    Reassessment: Patient reassessed immediately prior to procedure     Reviewed: vital signs     Verified: bag valve mask available, emergency equipment available, intubation equipment available and oxygen available   Procedure details (see MAR for exact dosages):    Preoxygenation:  Nasal cannula   Sedation:  Etomidate   Intended level of sedation: deep   Analgesia:  Fentanyl   Intra-procedure monitoring:  Blood pressure monitoring, cardiac monitor, continuous capnometry and continuous pulse oximetry   Intra-procedure events: none     Total Provider sedation time (minutes):  30 Post-procedure details:    Post-sedation assessment completed:  10/17/2021 7:04 PM   Attendance: Constant attendance by certified staff until patient recovered     Recovery: Patient returned to pre-procedure baseline     Post-sedation assessments completed and reviewed: airway patency, cardiovascular function, mental status and respiratory function     Patient is stable for discharge or admission: yes     Procedure completion:  Tolerated well, no immediate complications  Reduction of dislocation Date/Time: 8:10 PM Performed by: Harvest Dark Authorized by: Harvest Dark Consent: Verbal consent obtained. Risks and benefits: risks, benefits and alternatives were discussed Consent given by: patient Required  items: required blood products, implants, devices, and special equipment available Time out: Immediately prior to procedure a "time out" was called to verify the correct patient, procedure, equipment, support staff and site/side marked as required.  Patient sedated: Yes  Vitals: Vital signs were monitored during sedation. Patient tolerance: Patient tolerated the procedure well with no immediate complications. Joint: Right shoulder Reduction technique: Traction countertraction   After reduction repeat x-ray shows distal humerus fracture.  Is not clear if this occurred during the reduction.  I discussed this finding with the patient.  I spoke to Dr. Leim Fabry of orthopedics who recommends obtaining CT imaging to ensure reduction and to evaluate for possible intervention/surgery versus no surgery.  Spoke to Dr. [name] after the CT, he believes patient safe for discharge home with outpatient follow-up.  Likely will not require surgery.  Patient agreeable to plan.  We will discharge with Ultram and Ortho follow-up.  FINAL CLINICAL IMPRESSION(S) / ED DIAGNOSES   Right shoulder dislocation Fall Syncope  Rx / DC Orders   Tramadol  Note:  This document was prepared using Dragon voice recognition software and may include unintentional dictation errors.   Harvest Dark, MD 10/17/21 2010    Harvest Dark, MD 10/17/21 2010

## 2021-10-17 NOTE — Discharge Instructions (Addendum)
Please follow-up with orthopedic surgery by calling the number provided.  Return to the emergency department for any worsening pain or any other symptom personally concerning to yourself.  You may use Tylenol every 6-8 hours as needed for pain/discomfort as written on the box.  You may also use your prescribed pain medication if needed for additional pain as prescribed.

## 2021-10-30 DIAGNOSIS — E118 Type 2 diabetes mellitus with unspecified complications: Secondary | ICD-10-CM | POA: Diagnosis not present

## 2021-10-30 DIAGNOSIS — M25511 Pain in right shoulder: Secondary | ICD-10-CM | POA: Diagnosis not present

## 2021-11-09 DIAGNOSIS — Z20822 Contact with and (suspected) exposure to covid-19: Secondary | ICD-10-CM | POA: Diagnosis not present

## 2021-11-12 DIAGNOSIS — K449 Diaphragmatic hernia without obstruction or gangrene: Secondary | ICD-10-CM | POA: Diagnosis not present

## 2021-11-12 DIAGNOSIS — Z90710 Acquired absence of both cervix and uterus: Secondary | ICD-10-CM | POA: Diagnosis not present

## 2021-11-12 DIAGNOSIS — E119 Type 2 diabetes mellitus without complications: Secondary | ICD-10-CM | POA: Diagnosis not present

## 2021-11-12 DIAGNOSIS — H04123 Dry eye syndrome of bilateral lacrimal glands: Secondary | ICD-10-CM | POA: Diagnosis not present

## 2021-11-12 DIAGNOSIS — E7800 Pure hypercholesterolemia, unspecified: Secondary | ICD-10-CM | POA: Diagnosis not present

## 2021-11-12 DIAGNOSIS — H338 Other retinal detachments: Secondary | ICD-10-CM | POA: Diagnosis not present

## 2021-11-12 DIAGNOSIS — H409 Unspecified glaucoma: Secondary | ICD-10-CM | POA: Diagnosis not present

## 2021-11-12 DIAGNOSIS — K8020 Calculus of gallbladder without cholecystitis without obstruction: Secondary | ICD-10-CM | POA: Diagnosis not present

## 2021-11-12 DIAGNOSIS — Z981 Arthrodesis status: Secondary | ICD-10-CM | POA: Diagnosis not present

## 2021-11-12 DIAGNOSIS — Z961 Presence of intraocular lens: Secondary | ICD-10-CM | POA: Diagnosis not present

## 2021-11-12 DIAGNOSIS — Z9049 Acquired absence of other specified parts of digestive tract: Secondary | ICD-10-CM | POA: Diagnosis not present

## 2021-11-12 DIAGNOSIS — K219 Gastro-esophageal reflux disease without esophagitis: Secondary | ICD-10-CM | POA: Diagnosis not present

## 2021-11-12 DIAGNOSIS — D649 Anemia, unspecified: Secondary | ICD-10-CM | POA: Diagnosis not present

## 2021-11-12 DIAGNOSIS — M80021D Age-related osteoporosis with current pathological fracture, right humerus, subsequent encounter for fracture with routine healing: Secondary | ICD-10-CM | POA: Diagnosis not present

## 2021-11-12 DIAGNOSIS — N393 Stress incontinence (female) (male): Secondary | ICD-10-CM | POA: Diagnosis not present

## 2021-11-12 DIAGNOSIS — K2270 Barrett's esophagus without dysplasia: Secondary | ICD-10-CM | POA: Diagnosis not present

## 2021-11-12 DIAGNOSIS — M25511 Pain in right shoulder: Secondary | ICD-10-CM | POA: Diagnosis not present

## 2021-11-12 DIAGNOSIS — I10 Essential (primary) hypertension: Secondary | ICD-10-CM | POA: Diagnosis not present

## 2021-11-12 DIAGNOSIS — K5730 Diverticulosis of large intestine without perforation or abscess without bleeding: Secondary | ICD-10-CM | POA: Diagnosis not present

## 2021-11-12 DIAGNOSIS — Z7984 Long term (current) use of oral hypoglycemic drugs: Secondary | ICD-10-CM | POA: Diagnosis not present

## 2021-11-12 DIAGNOSIS — H401121 Primary open-angle glaucoma, left eye, mild stage: Secondary | ICD-10-CM | POA: Diagnosis not present

## 2021-11-12 DIAGNOSIS — H33053 Total retinal detachment, bilateral: Secondary | ICD-10-CM | POA: Diagnosis not present

## 2021-11-12 DIAGNOSIS — H401113 Primary open-angle glaucoma, right eye, severe stage: Secondary | ICD-10-CM | POA: Diagnosis not present

## 2021-11-12 DIAGNOSIS — Z9181 History of falling: Secondary | ICD-10-CM | POA: Diagnosis not present

## 2021-11-14 DIAGNOSIS — M80021D Age-related osteoporosis with current pathological fracture, right humerus, subsequent encounter for fracture with routine healing: Secondary | ICD-10-CM | POA: Diagnosis not present

## 2021-11-14 DIAGNOSIS — E119 Type 2 diabetes mellitus without complications: Secondary | ICD-10-CM | POA: Diagnosis not present

## 2021-11-14 DIAGNOSIS — K2270 Barrett's esophagus without dysplasia: Secondary | ICD-10-CM | POA: Diagnosis not present

## 2021-11-14 DIAGNOSIS — E7800 Pure hypercholesterolemia, unspecified: Secondary | ICD-10-CM | POA: Diagnosis not present

## 2021-11-14 DIAGNOSIS — I10 Essential (primary) hypertension: Secondary | ICD-10-CM | POA: Diagnosis not present

## 2021-11-14 DIAGNOSIS — D649 Anemia, unspecified: Secondary | ICD-10-CM | POA: Diagnosis not present

## 2021-11-15 DIAGNOSIS — K2270 Barrett's esophagus without dysplasia: Secondary | ICD-10-CM | POA: Diagnosis not present

## 2021-11-15 DIAGNOSIS — M80021D Age-related osteoporosis with current pathological fracture, right humerus, subsequent encounter for fracture with routine healing: Secondary | ICD-10-CM | POA: Diagnosis not present

## 2021-11-15 DIAGNOSIS — I10 Essential (primary) hypertension: Secondary | ICD-10-CM | POA: Diagnosis not present

## 2021-11-15 DIAGNOSIS — E7800 Pure hypercholesterolemia, unspecified: Secondary | ICD-10-CM | POA: Diagnosis not present

## 2021-11-15 DIAGNOSIS — D649 Anemia, unspecified: Secondary | ICD-10-CM | POA: Diagnosis not present

## 2021-11-15 DIAGNOSIS — E119 Type 2 diabetes mellitus without complications: Secondary | ICD-10-CM | POA: Diagnosis not present

## 2021-11-16 DIAGNOSIS — K2270 Barrett's esophagus without dysplasia: Secondary | ICD-10-CM | POA: Diagnosis not present

## 2021-11-16 DIAGNOSIS — Z85828 Personal history of other malignant neoplasm of skin: Secondary | ICD-10-CM | POA: Diagnosis not present

## 2021-11-16 DIAGNOSIS — E7800 Pure hypercholesterolemia, unspecified: Secondary | ICD-10-CM | POA: Diagnosis not present

## 2021-11-16 DIAGNOSIS — M80021D Age-related osteoporosis with current pathological fracture, right humerus, subsequent encounter for fracture with routine healing: Secondary | ICD-10-CM | POA: Diagnosis not present

## 2021-11-16 DIAGNOSIS — I10 Essential (primary) hypertension: Secondary | ICD-10-CM | POA: Diagnosis not present

## 2021-11-16 DIAGNOSIS — D649 Anemia, unspecified: Secondary | ICD-10-CM | POA: Diagnosis not present

## 2021-11-16 DIAGNOSIS — Z08 Encounter for follow-up examination after completed treatment for malignant neoplasm: Secondary | ICD-10-CM | POA: Diagnosis not present

## 2021-11-16 DIAGNOSIS — E119 Type 2 diabetes mellitus without complications: Secondary | ICD-10-CM | POA: Diagnosis not present

## 2021-11-19 DIAGNOSIS — M80021D Age-related osteoporosis with current pathological fracture, right humerus, subsequent encounter for fracture with routine healing: Secondary | ICD-10-CM | POA: Diagnosis not present

## 2021-11-19 DIAGNOSIS — K2270 Barrett's esophagus without dysplasia: Secondary | ICD-10-CM | POA: Diagnosis not present

## 2021-11-19 DIAGNOSIS — E119 Type 2 diabetes mellitus without complications: Secondary | ICD-10-CM | POA: Diagnosis not present

## 2021-11-19 DIAGNOSIS — I10 Essential (primary) hypertension: Secondary | ICD-10-CM | POA: Diagnosis not present

## 2021-11-19 DIAGNOSIS — D649 Anemia, unspecified: Secondary | ICD-10-CM | POA: Diagnosis not present

## 2021-11-19 DIAGNOSIS — E7800 Pure hypercholesterolemia, unspecified: Secondary | ICD-10-CM | POA: Diagnosis not present

## 2021-11-20 DIAGNOSIS — D649 Anemia, unspecified: Secondary | ICD-10-CM | POA: Diagnosis not present

## 2021-11-20 DIAGNOSIS — E7800 Pure hypercholesterolemia, unspecified: Secondary | ICD-10-CM | POA: Diagnosis not present

## 2021-11-20 DIAGNOSIS — M80021D Age-related osteoporosis with current pathological fracture, right humerus, subsequent encounter for fracture with routine healing: Secondary | ICD-10-CM | POA: Diagnosis not present

## 2021-11-20 DIAGNOSIS — I10 Essential (primary) hypertension: Secondary | ICD-10-CM | POA: Diagnosis not present

## 2021-11-20 DIAGNOSIS — E119 Type 2 diabetes mellitus without complications: Secondary | ICD-10-CM | POA: Diagnosis not present

## 2021-11-20 DIAGNOSIS — K2270 Barrett's esophagus without dysplasia: Secondary | ICD-10-CM | POA: Diagnosis not present

## 2021-11-22 DIAGNOSIS — K2270 Barrett's esophagus without dysplasia: Secondary | ICD-10-CM | POA: Diagnosis not present

## 2021-11-22 DIAGNOSIS — E7800 Pure hypercholesterolemia, unspecified: Secondary | ICD-10-CM | POA: Diagnosis not present

## 2021-11-22 DIAGNOSIS — E119 Type 2 diabetes mellitus without complications: Secondary | ICD-10-CM | POA: Diagnosis not present

## 2021-11-22 DIAGNOSIS — I10 Essential (primary) hypertension: Secondary | ICD-10-CM | POA: Diagnosis not present

## 2021-11-22 DIAGNOSIS — D649 Anemia, unspecified: Secondary | ICD-10-CM | POA: Diagnosis not present

## 2021-11-22 DIAGNOSIS — M80021D Age-related osteoporosis with current pathological fracture, right humerus, subsequent encounter for fracture with routine healing: Secondary | ICD-10-CM | POA: Diagnosis not present

## 2021-11-26 DIAGNOSIS — M80021D Age-related osteoporosis with current pathological fracture, right humerus, subsequent encounter for fracture with routine healing: Secondary | ICD-10-CM | POA: Diagnosis not present

## 2021-11-26 DIAGNOSIS — E7800 Pure hypercholesterolemia, unspecified: Secondary | ICD-10-CM | POA: Diagnosis not present

## 2021-11-26 DIAGNOSIS — I10 Essential (primary) hypertension: Secondary | ICD-10-CM | POA: Diagnosis not present

## 2021-11-26 DIAGNOSIS — K2270 Barrett's esophagus without dysplasia: Secondary | ICD-10-CM | POA: Diagnosis not present

## 2021-11-26 DIAGNOSIS — E119 Type 2 diabetes mellitus without complications: Secondary | ICD-10-CM | POA: Diagnosis not present

## 2021-11-26 DIAGNOSIS — D649 Anemia, unspecified: Secondary | ICD-10-CM | POA: Diagnosis not present

## 2021-11-27 DIAGNOSIS — I10 Essential (primary) hypertension: Secondary | ICD-10-CM | POA: Diagnosis not present

## 2021-11-27 DIAGNOSIS — E7800 Pure hypercholesterolemia, unspecified: Secondary | ICD-10-CM | POA: Diagnosis not present

## 2021-11-27 DIAGNOSIS — K2270 Barrett's esophagus without dysplasia: Secondary | ICD-10-CM | POA: Diagnosis not present

## 2021-11-27 DIAGNOSIS — E119 Type 2 diabetes mellitus without complications: Secondary | ICD-10-CM | POA: Diagnosis not present

## 2021-11-27 DIAGNOSIS — D649 Anemia, unspecified: Secondary | ICD-10-CM | POA: Diagnosis not present

## 2021-11-27 DIAGNOSIS — M80021D Age-related osteoporosis with current pathological fracture, right humerus, subsequent encounter for fracture with routine healing: Secondary | ICD-10-CM | POA: Diagnosis not present

## 2021-11-28 ENCOUNTER — Other Ambulatory Visit: Payer: Self-pay | Admitting: Internal Medicine

## 2021-11-28 DIAGNOSIS — E119 Type 2 diabetes mellitus without complications: Secondary | ICD-10-CM

## 2021-11-28 NOTE — Telephone Encounter (Signed)
Requested Prescriptions  ?Pending Prescriptions Disp Refills  ?? Lancets (FREESTYLE) lancets [Pharmacy Med Name: LANCETS FREESTYLE 100'S 28G] 100 each 1  ?  Sig: USE 1 LANCET DAILY  ?  ? Endocrinology: Diabetes - Testing Supplies Passed - 11/28/2021  4:46 PM  ?  ?  Passed - Valid encounter within last 12 months  ?  Recent Outpatient Visits   ?      ? 6 months ago Type II diabetes mellitus with complication ([place])  ? [facility] Glean Hess, MD  ? 11 months ago Essential (primary) hypertension  ? [facility] Glean Hess, MD  ? 1 year ago Type II diabetes mellitus with complication [facility])  ? [facility] Glean Hess, MD  ? 1 year ago Type II diabetes mellitus with complication [facility])  ? [facility] Glean Hess, MD  ? 1 year ago Essential (primary) hypertension  ? [facility] Glean Hess, MD  ?  ?  ?Future Appointments   ?        ? In 2 weeks Army Melia Jesse Sans, MD [facility], [place]  ?  ? ?  ?  ?  ? ?

## 2021-11-29 DIAGNOSIS — K2270 Barrett's esophagus without dysplasia: Secondary | ICD-10-CM | POA: Diagnosis not present

## 2021-11-29 DIAGNOSIS — E119 Type 2 diabetes mellitus without complications: Secondary | ICD-10-CM | POA: Diagnosis not present

## 2021-11-29 DIAGNOSIS — D649 Anemia, unspecified: Secondary | ICD-10-CM | POA: Diagnosis not present

## 2021-11-29 DIAGNOSIS — M80021D Age-related osteoporosis with current pathological fracture, right humerus, subsequent encounter for fracture with routine healing: Secondary | ICD-10-CM | POA: Diagnosis not present

## 2021-11-29 DIAGNOSIS — E7800 Pure hypercholesterolemia, unspecified: Secondary | ICD-10-CM | POA: Diagnosis not present

## 2021-11-29 DIAGNOSIS — I10 Essential (primary) hypertension: Secondary | ICD-10-CM | POA: Diagnosis not present

## 2021-11-30 DIAGNOSIS — I10 Essential (primary) hypertension: Secondary | ICD-10-CM | POA: Diagnosis not present

## 2021-11-30 DIAGNOSIS — K2270 Barrett's esophagus without dysplasia: Secondary | ICD-10-CM | POA: Diagnosis not present

## 2021-11-30 DIAGNOSIS — D649 Anemia, unspecified: Secondary | ICD-10-CM | POA: Diagnosis not present

## 2021-11-30 DIAGNOSIS — E119 Type 2 diabetes mellitus without complications: Secondary | ICD-10-CM | POA: Diagnosis not present

## 2021-11-30 DIAGNOSIS — E7800 Pure hypercholesterolemia, unspecified: Secondary | ICD-10-CM | POA: Diagnosis not present

## 2021-11-30 DIAGNOSIS — M80021D Age-related osteoporosis with current pathological fracture, right humerus, subsequent encounter for fracture with routine healing: Secondary | ICD-10-CM | POA: Diagnosis not present

## 2021-12-03 DIAGNOSIS — C4401 Basal cell carcinoma of skin of lip: Secondary | ICD-10-CM | POA: Diagnosis not present

## 2021-12-04 DIAGNOSIS — I10 Essential (primary) hypertension: Secondary | ICD-10-CM | POA: Diagnosis not present

## 2021-12-04 DIAGNOSIS — M80021D Age-related osteoporosis with current pathological fracture, right humerus, subsequent encounter for fracture with routine healing: Secondary | ICD-10-CM | POA: Diagnosis not present

## 2021-12-04 DIAGNOSIS — E7800 Pure hypercholesterolemia, unspecified: Secondary | ICD-10-CM | POA: Diagnosis not present

## 2021-12-04 DIAGNOSIS — E119 Type 2 diabetes mellitus without complications: Secondary | ICD-10-CM | POA: Diagnosis not present

## 2021-12-04 DIAGNOSIS — D649 Anemia, unspecified: Secondary | ICD-10-CM | POA: Diagnosis not present

## 2021-12-04 DIAGNOSIS — K2270 Barrett's esophagus without dysplasia: Secondary | ICD-10-CM | POA: Diagnosis not present

## 2021-12-05 DIAGNOSIS — E7800 Pure hypercholesterolemia, unspecified: Secondary | ICD-10-CM | POA: Diagnosis not present

## 2021-12-05 DIAGNOSIS — D649 Anemia, unspecified: Secondary | ICD-10-CM | POA: Diagnosis not present

## 2021-12-05 DIAGNOSIS — M80021D Age-related osteoporosis with current pathological fracture, right humerus, subsequent encounter for fracture with routine healing: Secondary | ICD-10-CM | POA: Diagnosis not present

## 2021-12-05 DIAGNOSIS — K2270 Barrett's esophagus without dysplasia: Secondary | ICD-10-CM | POA: Diagnosis not present

## 2021-12-05 DIAGNOSIS — I10 Essential (primary) hypertension: Secondary | ICD-10-CM | POA: Diagnosis not present

## 2021-12-05 DIAGNOSIS — E119 Type 2 diabetes mellitus without complications: Secondary | ICD-10-CM | POA: Diagnosis not present

## 2021-12-06 DIAGNOSIS — M25511 Pain in right shoulder: Secondary | ICD-10-CM | POA: Diagnosis not present

## 2021-12-07 DIAGNOSIS — E7800 Pure hypercholesterolemia, unspecified: Secondary | ICD-10-CM | POA: Diagnosis not present

## 2021-12-07 DIAGNOSIS — M80021D Age-related osteoporosis with current pathological fracture, right humerus, subsequent encounter for fracture with routine healing: Secondary | ICD-10-CM | POA: Diagnosis not present

## 2021-12-07 DIAGNOSIS — D649 Anemia, unspecified: Secondary | ICD-10-CM | POA: Diagnosis not present

## 2021-12-07 DIAGNOSIS — K2270 Barrett's esophagus without dysplasia: Secondary | ICD-10-CM | POA: Diagnosis not present

## 2021-12-07 DIAGNOSIS — E119 Type 2 diabetes mellitus without complications: Secondary | ICD-10-CM | POA: Diagnosis not present

## 2021-12-07 DIAGNOSIS — I10 Essential (primary) hypertension: Secondary | ICD-10-CM | POA: Diagnosis not present

## 2021-12-11 DIAGNOSIS — I10 Essential (primary) hypertension: Secondary | ICD-10-CM | POA: Diagnosis not present

## 2021-12-11 DIAGNOSIS — K2270 Barrett's esophagus without dysplasia: Secondary | ICD-10-CM | POA: Diagnosis not present

## 2021-12-11 DIAGNOSIS — M80021D Age-related osteoporosis with current pathological fracture, right humerus, subsequent encounter for fracture with routine healing: Secondary | ICD-10-CM | POA: Diagnosis not present

## 2021-12-11 DIAGNOSIS — E7800 Pure hypercholesterolemia, unspecified: Secondary | ICD-10-CM | POA: Diagnosis not present

## 2021-12-11 DIAGNOSIS — E119 Type 2 diabetes mellitus without complications: Secondary | ICD-10-CM | POA: Diagnosis not present

## 2021-12-11 DIAGNOSIS — D649 Anemia, unspecified: Secondary | ICD-10-CM | POA: Diagnosis not present

## 2021-12-12 DIAGNOSIS — Z90710 Acquired absence of both cervix and uterus: Secondary | ICD-10-CM | POA: Diagnosis not present

## 2021-12-12 DIAGNOSIS — Z9049 Acquired absence of other specified parts of digestive tract: Secondary | ICD-10-CM | POA: Diagnosis not present

## 2021-12-12 DIAGNOSIS — M25511 Pain in right shoulder: Secondary | ICD-10-CM | POA: Diagnosis not present

## 2021-12-12 DIAGNOSIS — Z7984 Long term (current) use of oral hypoglycemic drugs: Secondary | ICD-10-CM | POA: Diagnosis not present

## 2021-12-12 DIAGNOSIS — K449 Diaphragmatic hernia without obstruction or gangrene: Secondary | ICD-10-CM | POA: Diagnosis not present

## 2021-12-12 DIAGNOSIS — D649 Anemia, unspecified: Secondary | ICD-10-CM | POA: Diagnosis not present

## 2021-12-12 DIAGNOSIS — K219 Gastro-esophageal reflux disease without esophagitis: Secondary | ICD-10-CM | POA: Diagnosis not present

## 2021-12-12 DIAGNOSIS — H33053 Total retinal detachment, bilateral: Secondary | ICD-10-CM | POA: Diagnosis not present

## 2021-12-12 DIAGNOSIS — Z981 Arthrodesis status: Secondary | ICD-10-CM | POA: Diagnosis not present

## 2021-12-12 DIAGNOSIS — E7800 Pure hypercholesterolemia, unspecified: Secondary | ICD-10-CM | POA: Diagnosis not present

## 2021-12-12 DIAGNOSIS — N393 Stress incontinence (female) (male): Secondary | ICD-10-CM | POA: Diagnosis not present

## 2021-12-12 DIAGNOSIS — K5730 Diverticulosis of large intestine without perforation or abscess without bleeding: Secondary | ICD-10-CM | POA: Diagnosis not present

## 2021-12-12 DIAGNOSIS — M80021D Age-related osteoporosis with current pathological fracture, right humerus, subsequent encounter for fracture with routine healing: Secondary | ICD-10-CM | POA: Diagnosis not present

## 2021-12-12 DIAGNOSIS — K2270 Barrett's esophagus without dysplasia: Secondary | ICD-10-CM | POA: Diagnosis not present

## 2021-12-12 DIAGNOSIS — Z9181 History of falling: Secondary | ICD-10-CM | POA: Diagnosis not present

## 2021-12-12 DIAGNOSIS — E119 Type 2 diabetes mellitus without complications: Secondary | ICD-10-CM | POA: Diagnosis not present

## 2021-12-12 DIAGNOSIS — I10 Essential (primary) hypertension: Secondary | ICD-10-CM | POA: Diagnosis not present

## 2021-12-12 DIAGNOSIS — K8020 Calculus of gallbladder without cholecystitis without obstruction: Secondary | ICD-10-CM | POA: Diagnosis not present

## 2021-12-12 DIAGNOSIS — H409 Unspecified glaucoma: Secondary | ICD-10-CM | POA: Diagnosis not present

## 2021-12-14 ENCOUNTER — Other Ambulatory Visit: Payer: Self-pay | Admitting: Internal Medicine

## 2021-12-14 ENCOUNTER — Ambulatory Visit (INDEPENDENT_AMBULATORY_CARE_PROVIDER_SITE_OTHER): Payer: Medicare Other | Admitting: Internal Medicine

## 2021-12-14 ENCOUNTER — Encounter: Payer: Self-pay | Admitting: Internal Medicine

## 2021-12-14 ENCOUNTER — Other Ambulatory Visit: Payer: Self-pay

## 2021-12-14 VITALS — BP 120/74 | HR 64 | Ht 61.0 in | Wt 131.6 lb

## 2021-12-14 DIAGNOSIS — M810 Age-related osteoporosis without current pathological fracture: Secondary | ICD-10-CM | POA: Diagnosis not present

## 2021-12-14 DIAGNOSIS — E1169 Type 2 diabetes mellitus with other specified complication: Secondary | ICD-10-CM | POA: Diagnosis not present

## 2021-12-14 DIAGNOSIS — E118 Type 2 diabetes mellitus with unspecified complications: Secondary | ICD-10-CM | POA: Diagnosis not present

## 2021-12-14 DIAGNOSIS — K2270 Barrett's esophagus without dysplasia: Secondary | ICD-10-CM | POA: Diagnosis not present

## 2021-12-14 DIAGNOSIS — R8271 Bacteriuria: Secondary | ICD-10-CM | POA: Diagnosis not present

## 2021-12-14 DIAGNOSIS — E7800 Pure hypercholesterolemia, unspecified: Secondary | ICD-10-CM | POA: Diagnosis not present

## 2021-12-14 DIAGNOSIS — E119 Type 2 diabetes mellitus without complications: Secondary | ICD-10-CM | POA: Diagnosis not present

## 2021-12-14 DIAGNOSIS — E785 Hyperlipidemia, unspecified: Secondary | ICD-10-CM

## 2021-12-14 DIAGNOSIS — I10 Essential (primary) hypertension: Secondary | ICD-10-CM | POA: Diagnosis not present

## 2021-12-14 DIAGNOSIS — M80021D Age-related osteoporosis with current pathological fracture, right humerus, subsequent encounter for fracture with routine healing: Secondary | ICD-10-CM | POA: Diagnosis not present

## 2021-12-14 DIAGNOSIS — D649 Anemia, unspecified: Secondary | ICD-10-CM | POA: Diagnosis not present

## 2021-12-14 LAB — POCT URINALYSIS DIPSTICK
Bilirubin, UA: NEGATIVE
Blood, UA: NEGATIVE
Glucose, UA: NEGATIVE
Ketones, UA: NEGATIVE
Nitrite, UA: POSITIVE
Protein, UA: NEGATIVE
Spec Grav, UA: <=1.005 — AB (ref 1.010–1.025)
Urobilinogen, UA: 0.2 E.U./dL
pH, UA: 6.5 (ref 5.0–8.0)

## 2021-12-14 MED ORDER — ESOMEPRAZOLE MAGNESIUM 40 MG PO CPDR: 40.0000 mg | DELAYED_RELEASE_CAPSULE | Freq: Two times a day (BID) | ORAL | 1 refills | Status: DC

## 2021-12-14 NOTE — Progress Notes (Signed)
Date:  12/14/2021   Name:  Lori Harrell   DOB:  1941-01-19   MRN:  [id number]   Chief Complaint: Annual Exam Lori Harrell Cancel is a 81 y.o. female who presents today for her Complete Annual Exam. She feels well. She reports doing PT/OT. She reports she is sleeping poorly, only sleeping 3 hours at a time due to nocturia. Breast complaints - none.  Mammogram: 03/2019 DEXA: 06/2018 osteopenia hip/normal spine Colonoscopy: aged out  Immunization History  Administered Date(s) Administered   Fluad Quad(high Dose 65+) 08/12/2019, 08/28/2020, 05/31/2021   Influenza, High Dose Seasonal PF 07/07/2017, 07/08/2018   Influenza, Seasonal, Injecte, Preservative Fre 08/19/2013   Influenza,inj,Quad PF,6+ Mos 06/23/2015, 07/04/2016   Influenza-Unspecified 06/18/2011, 08/21/2012   PFIZER(Purple Top)SARS-COV-2 Vaccination 12/24/2019, 01/14/2020, 10/13/2020   Pneumococcal Conjugate-13 06/20/2014   Pneumococcal Polysaccharide-23 10/02/2007   Tdap 10/01/2010    Hypertension This is a chronic problem. The problem is controlled. Pertinent negatives include no chest pain, headaches, palpitations or shortness of breath. Past treatments include ACE inhibitors and diuretics.  Gastroesophageal Reflux She complains of heartburn. She reports no abdominal pain, no chest pain, no coughing or no wheezing. This is a recurrent problem. Pertinent negatives include no fatigue. Risk factors include Barrett's esophagus. She has tried a PPI for the symptoms. The treatment provided significant relief.  Diabetes She presents for her follow-up diabetic visit. She has type 2 diabetes mellitus. Pertinent negatives for hypoglycemia include no dizziness, headaches, nervousness/anxiousness or tremors. Pertinent negatives for diabetes include no chest pain, no fatigue, no polydipsia and no polyuria. Current diabetic treatment includes oral agent (monotherapy) (metformin). There is no change in her home blood glucose  trend. An ACE inhibitor/angiotensin II receptor blocker is being taken. Eye exam is current.  Hyperlipidemia This is a chronic problem. The problem is controlled. Pertinent negatives include no chest pain or shortness of breath. Current antihyperlipidemic treatment includes statins.   Lab Results  Component Value Date   NA 139 12/12/2020   K 4.6 12/12/2020   CO2 21 12/12/2020   GLUCOSE 112 (H) 12/12/2020   BUN 14 12/12/2020   CREATININE 0.83 12/12/2020   CALCIUM 10.0 12/12/2020   EGFR 71 12/12/2020   GFRNONAA 62 08/28/2020   Lab Results  Component Value Date   CHOL 157 12/12/2020   HDL 51 12/12/2020   LDLCALC 91 12/12/2020   TRIG 76 12/12/2020   CHOLHDL 3.1 12/12/2020   Lab Results  Component Value Date   TSH 6.330 (H) 12/12/2020   Lab Results  Component Value Date   HGBA1C 5.9 (A) 05/31/2021   Lab Results  Component Value Date   WBC 7.6 12/12/2020   HGB 12.3 12/12/2020   HCT 37.9 12/12/2020   MCV 93 12/12/2020   PLT 248 12/12/2020   Lab Results  Component Value Date   ALT 21 12/12/2020   AST 28 12/12/2020   ALKPHOS 66 12/12/2020   BILITOT 0.4 12/12/2020   No results found for: 25OHVITD2, 25OHVITD3, VD25OH   Review of Systems  Constitutional:  Negative for chills, fatigue and fever.  HENT:  Negative for congestion, hearing loss, tinnitus, trouble swallowing and voice change.   Eyes:  Negative for visual disturbance.  Respiratory:  Negative for cough, chest tightness, shortness of breath and wheezing.   Cardiovascular:  Negative for chest pain, palpitations and leg swelling.  Gastrointestinal:  Positive for heartburn. Negative for abdominal pain, constipation, diarrhea and vomiting.  Endocrine: Negative for polydipsia and polyuria.  Genitourinary:  Negative for  dysuria, frequency, genital sores, vaginal bleeding and vaginal discharge.  Musculoskeletal:  Negative for arthralgias, gait problem and joint swelling.  Skin:  Negative for color change and rash.   Neurological:  Negative for dizziness, tremors, light-headedness and headaches.  Hematological:  Negative for adenopathy. Does not bruise/bleed easily.  Psychiatric/Behavioral:  Negative for dysphoric mood and sleep disturbance. The patient is not nervous/anxious.    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 05/08/2017   Large hiatal hernia 07/04/2016   Sigmoid diverticulitis 07/04/2016   Stress incontinence 07/04/2016   Type II diabetes mellitus with complication (HCC) 10/31/2015   Constipation 10/31/2015   Barrett's esophagus without dysplasia 06/16/2015   Essential (primary) hypertension 06/16/2015   Hyperlipidemia associated with type 2 diabetes mellitus (HCC) 06/16/2015   OP (osteoporosis) 06/16/2015   Hypercalcemia 04/22/2012    No Known Allergies  Past Surgical History:  Procedure Laterality Date   ANTERIOR LATERAL LUMBAR FUSION WITH PERCUTANEOUS SCREW 2 LEVEL N/A 05/19/2019   Procedure: L2-4 LATERAL LUMBAR INTERBODY FUSION (XLIF);  Surgeon: Venetia Night, MD;  Location: [facility] ORS;  Service: Neurosurgery;  Laterality: N/A;   CATARACT EXTRACTION     CHOLECYSTECTOMY, LAPAROSCOPIC  02/05/2019   [facility]   COLONOSCOPY  2007   sigmoid diverticulosis   ENDOSCOPIC RETROGRADE CHOLANGIOPANCREATOGRAPHY (ERCP) WITH PROPOFOL N/A 02/02/2019   Procedure: ENDOSCOPIC RETROGRADE CHOLANGIOPANCREATOGRAPHY (ERCP) WITH PROPOFOL;  Surgeon: Midge Minium, MD;  Location: [facility] ENDOSCOPY;  Service: Endoscopy;  Laterality: N/A;   TONSILLECTOMY     UPPER GASTROINTESTINAL ENDOSCOPY  02/2013   Barrett's - done at [facility]   VAGINAL HYSTERECTOMY       Social History   Tobacco Use   Smoking status: Never   Smokeless tobacco: Never   Tobacco comments:    smoking cessation materials not required  Vaping Use   Vaping Use: Never used  Substance Use Topics   Alcohol use: No    Alcohol/week: 0.0 standard drinks   Drug use: No     Medication list has been reviewed and updated.  Current Meds  Medication Sig   ASPERCREME LIDOCAINE EX Apply 1 application topically 3 (three) times daily as needed (pain.).   atorvastatin (LIPITOR) 20 MG tablet TAKE 1 TABLET DAILY   Calcium Carb-Cholecalciferol (CALCIUM 1000 + D PO) Take by mouth.   dorzolamide (TRUSOPT) 2 % ophthalmic solution 1 drop 3 (three) times daily.   enalapril (VASOTEC) 20 MG tablet TAKE 1 TABLET DAILY   FREESTYLE LITE test strip USE TO TEST BLOOD SUGAR DAILY   Lancets (FREESTYLE) lancets USE 1 LANCET DAILY   latanoprost (XALATAN) 0.005 % ophthalmic solution Place 1 drop into both eyes at bedtime.   Menthol, Topical Analgesic, (BIOFREEZE) 10 % LIQD Apply topically. biofreeze   metFORMIN (GLUCOPHAGE) 500 MG tablet Take 1 tablet (500 mg total) by mouth daily.   Multiple Vitamin (MULTIVITAMIN WITH MINERALS) TABS tablet Take 1 tablet by mouth every evening. Women's Centrum Silver   spironolactone (ALDACTONE) 25 MG tablet TAKE 1 TABLET DAILY   traMADol (ULTRAM) 50 MG tablet Take 1 tablet (50 mg total) by mouth every 6 (six) hours as needed.   [DISCONTINUED] esomeprazole (NEXIUM) 40  MG capsule Take 1 capsule (40 mg total) by mouth 2 (two) times daily.    PHQ 2/9 Scores 12/14/2021 07/18/2021 05/31/2021 12/12/2020  PHQ - 2 Score 0 0 0 0  PHQ- 9 Score 1 2 2  0    GAD 7 : Generalized Anxiety Score 12/14/2021 05/31/2021 12/12/2020 08/28/2020  Nervous, Anxious, on Edge 0 0 0 0  Control/stop worrying 0 0 0 0  Worry too much - different things 0 0 0 0  Trouble relaxing 0 0 0 0  Restless 0 0 0 0  Easily annoyed or irritable 0 0 0 0  Afraid - awful might happen 0 0 0 0  Total GAD 7 Score  0 0 0 0  Anxiety Difficulty Not difficult at all - Not difficult at all Not difficult at all    BP Readings from Last 3 Encounters:  12/14/21 120/74  10/17/21 (!) 112/55  05/31/21 140/82    Physical Exam Vitals and nursing note reviewed.  Constitutional:      General: She is not in acute distress.    Appearance: She is well-developed.  HENT:     Head: Normocephalic and atraumatic.     Right Ear: Tympanic membrane and ear canal normal.     Left Ear: Tympanic membrane and ear canal normal.     Nose:     Right Sinus: No maxillary sinus tenderness.     Left Sinus: No maxillary sinus tenderness.  Eyes:     General: No scleral icterus.       Right eye: No discharge.        Left eye: No discharge.     Conjunctiva/sclera: Conjunctivae normal.  Neck:     Thyroid: No thyromegaly.     Vascular: No carotid bruit.  Cardiovascular:     Rate and Rhythm: Normal rate and regular rhythm.     Pulses: Normal pulses.     Heart sounds: Normal heart sounds.  Pulmonary:     Effort: Pulmonary effort is normal. No respiratory distress.     Breath sounds: No wheezing.  Chest:  Breasts:    Right: No mass, nipple discharge, skin change or tenderness.     Left: No mass, nipple discharge, skin change or tenderness.  Abdominal:     General: Bowel sounds are normal.     Palpations: Abdomen is soft.     Tenderness: There is no abdominal tenderness.  Musculoskeletal:     Cervical back: Normal range of motion. No erythema.     Right lower leg: No edema.     Left lower leg: No edema.  Lymphadenopathy:     Cervical: No cervical adenopathy.  Skin:    General: Skin is warm and dry.     Findings: No rash.  Neurological:     Mental Status: She is alert and oriented to person, place, and time.     Cranial Nerves: No cranial nerve deficit.     Sensory: No sensory deficit.     Deep Tendon Reflexes: Reflexes are normal and symmetric.  Psychiatric:        Attention and Perception: Attention normal.         Mood and Affect: Mood normal.    Wt Readings from Last 3 Encounters:  12/14/21 131 lb 9.6 oz (59.7 kg)  10/17/21 132 lb (59.9 kg)  05/31/21 131 lb (59.4 kg)    BP 120/74   Pulse 64   Ht 5\' 1"  (1.549 m)   Wt 131 lb 9.6 oz (  59.7 kg)   SpO2 100%   BMI 24.87 kg/m   Assessment and Plan: 1. Essential (primary) hypertension Clinically stable exam with well controlled BP. Tolerating medications without side effects at this time. Pt to continue current regimen and low sodium diet; benefits of regular exercise as able discussed. She has become more sedentary since her last fall. - POCT urinalysis dipstick - excessive bacteria so will send for culture  2. Barrett's esophagus without dysplasia No symptoms of dysphagia, pain, weight loss. Continue PPI bid. Follow up with GI as planned for EGD - CBC with Differential/Platelet - esomeprazole (NEXIUM) 40 MG capsule; Take 1 capsule (40 mg total) by mouth 2 (two) times daily.  Dispense: 180 capsule; Refill: 1  3. Type II diabetes mellitus with complication (HCC) Clinically stable by exam and report without s/s of hypoglycemia. DM complicated by hypertension and dyslipidemia. Tolerating medications well without side effects or other concerns. Eye exam up to date - Comprehensive metabolic panel - Hemoglobin A1c - TSH  4. Hyperlipidemia associated with type 2 diabetes mellitus (HCC) Tolerating statin medication without side effects at this time LDL is at goal of < 70 on current dose Continue same therapy without change at this time. - Lipid panel  5. Age related osteoporosis, unspecified pathological fracture presence Probably not a good candidate for Fosamax due to Barrett's esoph. Will repeat DEXA and then consider Endo referrla - DG Bone Density - VITAMIN D 25 Hydroxy (Vit-D Deficiency, Fractures)   Partially dictated using Animal nutritionist. Any errors are unintentional.  Bari Edward, MD [facility]  12/14/2021

## 2021-12-15 LAB — LIPID PANEL
Chol/HDL Ratio: 3.1 ratio (ref 0.0–4.4)
Cholesterol, Total: 159 mg/dL (ref 100–199)
HDL: 52 mg/dL (ref >39)
LDL Chol Calc (NIH): 91 mg/dL (ref 0–99)
Triglycerides: 87 mg/dL (ref 0–149)
VLDL Cholesterol Cal: 16 mg/dL (ref 5–40)

## 2021-12-15 LAB — COMPREHENSIVE METABOLIC PANEL
ALT: 19 IU/L (ref 0–32)
AST: 21 IU/L (ref 0–40)
Albumin/Globulin Ratio: 2.2 (ref 1.2–2.2)
Albumin: 4.8 g/dL — ABNORMAL HIGH (ref 3.6–4.6)
Alkaline Phosphatase: 76 IU/L (ref 44–121)
BUN/Creatinine Ratio: 17 (ref 12–28)
BUN: 13 mg/dL (ref 8–27)
Bilirubin Total: 0.4 mg/dL (ref 0.0–1.2)
CO2: 24 mmol/L (ref 20–29)
Calcium: 10 mg/dL (ref 8.7–10.3)
Chloride: 100 mmol/L (ref 96–106)
Creatinine, Ser: 0.77 mg/dL (ref 0.57–1.00)
Globulin, Total: 2.2 g/dL (ref 1.5–4.5)
Glucose: 106 mg/dL — ABNORMAL HIGH (ref 70–99)
Potassium: 4.9 mmol/L (ref 3.5–5.2)
Sodium: 136 mmol/L (ref 134–144)
Total Protein: 7 g/dL (ref 6.0–8.5)
eGFR: 77 mL/min/{1.73_m2} (ref >59)

## 2021-12-15 LAB — CBC WITH DIFFERENTIAL/PLATELET
Basophils Absolute: 0.1 10*3/uL (ref 0.0–0.2)
Basos: 1 %
EOS (ABSOLUTE): 0.1 10*3/uL (ref 0.0–0.4)
Eos: 2 %
Hematocrit: 38.3 % (ref 34.0–46.6)
Hemoglobin: 13 g/dL (ref 11.1–15.9)
Immature Grans (Abs): 0 10*3/uL (ref 0.0–0.1)
Immature Granulocytes: 0 %
Lymphocytes Absolute: 1.3 10*3/uL (ref 0.7–3.1)
Lymphs: 16 %
MCH: 31.7 pg (ref 26.6–33.0)
MCHC: 33.9 g/dL (ref 31.5–35.7)
MCV: 93 fL (ref 79–97)
Monocytes Absolute: 0.5 10*3/uL (ref 0.1–0.9)
Monocytes: 6 %
Neutrophils Absolute: 5.9 10*3/uL (ref 1.4–7.0)
Neutrophils: 75 %
Platelets: 284 10*3/uL (ref 150–450)
RBC: 4.1 x10E6/uL (ref 3.77–5.28)
RDW: 12.3 % (ref 11.7–15.4)
WBC: 7.9 10*3/uL (ref 3.4–10.8)

## 2021-12-15 LAB — HEMOGLOBIN A1C
Est. average glucose Bld gHb Est-mCnc: 120 mg/dL
Hgb A1c MFr Bld: 5.8 % — ABNORMAL HIGH (ref 4.8–5.6)

## 2021-12-15 LAB — TSH: TSH: 5.65 u[IU]/mL — ABNORMAL HIGH (ref 0.450–4.500)

## 2021-12-15 LAB — MICROALBUMIN / CREATININE URINE RATIO
Creatinine, Urine: 45.9 mg/dL
Microalb/Creat Ratio: 10 mg/g creat (ref 0–29)
Microalbumin, Urine: 4.5 ug/mL

## 2021-12-15 LAB — VITAMIN D 25 HYDROXY (VIT D DEFICIENCY, FRACTURES): Vit D, 25-Hydroxy: 61 ng/mL (ref 30.0–100.0)

## 2021-12-18 ENCOUNTER — Other Ambulatory Visit: Payer: Self-pay | Admitting: Internal Medicine

## 2021-12-18 DIAGNOSIS — M80021D Age-related osteoporosis with current pathological fracture, right humerus, subsequent encounter for fracture with routine healing: Secondary | ICD-10-CM | POA: Diagnosis not present

## 2021-12-18 DIAGNOSIS — I10 Essential (primary) hypertension: Secondary | ICD-10-CM | POA: Diagnosis not present

## 2021-12-18 DIAGNOSIS — E119 Type 2 diabetes mellitus without complications: Secondary | ICD-10-CM | POA: Diagnosis not present

## 2021-12-18 DIAGNOSIS — E7800 Pure hypercholesterolemia, unspecified: Secondary | ICD-10-CM | POA: Diagnosis not present

## 2021-12-18 DIAGNOSIS — K2270 Barrett's esophagus without dysplasia: Secondary | ICD-10-CM | POA: Diagnosis not present

## 2021-12-18 DIAGNOSIS — D649 Anemia, unspecified: Secondary | ICD-10-CM | POA: Diagnosis not present

## 2021-12-18 DIAGNOSIS — N3000 Acute cystitis without hematuria: Secondary | ICD-10-CM

## 2021-12-18 LAB — URINE CULTURE

## 2021-12-18 MED ORDER — CEFUROXIME AXETIL 250 MG PO TABS: 250.0000 mg | ORAL_TABLET | Freq: Two times a day (BID) | ORAL | 0 refills | Status: AC

## 2021-12-19 DIAGNOSIS — K2270 Barrett's esophagus without dysplasia: Secondary | ICD-10-CM | POA: Diagnosis not present

## 2021-12-19 DIAGNOSIS — I10 Essential (primary) hypertension: Secondary | ICD-10-CM | POA: Diagnosis not present

## 2021-12-19 DIAGNOSIS — D649 Anemia, unspecified: Secondary | ICD-10-CM | POA: Diagnosis not present

## 2021-12-19 DIAGNOSIS — M80021D Age-related osteoporosis with current pathological fracture, right humerus, subsequent encounter for fracture with routine healing: Secondary | ICD-10-CM | POA: Diagnosis not present

## 2021-12-19 DIAGNOSIS — E119 Type 2 diabetes mellitus without complications: Secondary | ICD-10-CM | POA: Diagnosis not present

## 2021-12-19 DIAGNOSIS — E7800 Pure hypercholesterolemia, unspecified: Secondary | ICD-10-CM | POA: Diagnosis not present

## 2021-12-20 DIAGNOSIS — E119 Type 2 diabetes mellitus without complications: Secondary | ICD-10-CM | POA: Diagnosis not present

## 2021-12-20 DIAGNOSIS — M80021D Age-related osteoporosis with current pathological fracture, right humerus, subsequent encounter for fracture with routine healing: Secondary | ICD-10-CM | POA: Diagnosis not present

## 2021-12-20 DIAGNOSIS — I10 Essential (primary) hypertension: Secondary | ICD-10-CM | POA: Diagnosis not present

## 2021-12-20 DIAGNOSIS — E7800 Pure hypercholesterolemia, unspecified: Secondary | ICD-10-CM | POA: Diagnosis not present

## 2021-12-20 DIAGNOSIS — D649 Anemia, unspecified: Secondary | ICD-10-CM | POA: Diagnosis not present

## 2021-12-20 DIAGNOSIS — K2270 Barrett's esophagus without dysplasia: Secondary | ICD-10-CM | POA: Diagnosis not present

## 2021-12-24 DIAGNOSIS — K2270 Barrett's esophagus without dysplasia: Secondary | ICD-10-CM | POA: Diagnosis not present

## 2021-12-24 DIAGNOSIS — E7800 Pure hypercholesterolemia, unspecified: Secondary | ICD-10-CM | POA: Diagnosis not present

## 2021-12-24 DIAGNOSIS — I10 Essential (primary) hypertension: Secondary | ICD-10-CM | POA: Diagnosis not present

## 2021-12-24 DIAGNOSIS — E119 Type 2 diabetes mellitus without complications: Secondary | ICD-10-CM | POA: Diagnosis not present

## 2021-12-24 DIAGNOSIS — D649 Anemia, unspecified: Secondary | ICD-10-CM | POA: Diagnosis not present

## 2021-12-24 DIAGNOSIS — M80021D Age-related osteoporosis with current pathological fracture, right humerus, subsequent encounter for fracture with routine healing: Secondary | ICD-10-CM | POA: Diagnosis not present

## 2021-12-25 DIAGNOSIS — D649 Anemia, unspecified: Secondary | ICD-10-CM | POA: Diagnosis not present

## 2021-12-25 DIAGNOSIS — K2270 Barrett's esophagus without dysplasia: Secondary | ICD-10-CM | POA: Diagnosis not present

## 2021-12-25 DIAGNOSIS — I10 Essential (primary) hypertension: Secondary | ICD-10-CM | POA: Diagnosis not present

## 2021-12-25 DIAGNOSIS — M80021D Age-related osteoporosis with current pathological fracture, right humerus, subsequent encounter for fracture with routine healing: Secondary | ICD-10-CM | POA: Diagnosis not present

## 2021-12-25 DIAGNOSIS — E119 Type 2 diabetes mellitus without complications: Secondary | ICD-10-CM | POA: Diagnosis not present

## 2021-12-25 DIAGNOSIS — E7800 Pure hypercholesterolemia, unspecified: Secondary | ICD-10-CM | POA: Diagnosis not present

## 2021-12-27 ENCOUNTER — Other Ambulatory Visit: Payer: Self-pay | Admitting: Internal Medicine

## 2021-12-27 DIAGNOSIS — I10 Essential (primary) hypertension: Secondary | ICD-10-CM | POA: Diagnosis not present

## 2021-12-27 DIAGNOSIS — E7800 Pure hypercholesterolemia, unspecified: Secondary | ICD-10-CM | POA: Diagnosis not present

## 2021-12-27 DIAGNOSIS — K2270 Barrett's esophagus without dysplasia: Secondary | ICD-10-CM | POA: Diagnosis not present

## 2021-12-27 DIAGNOSIS — E119 Type 2 diabetes mellitus without complications: Secondary | ICD-10-CM | POA: Diagnosis not present

## 2021-12-27 DIAGNOSIS — D649 Anemia, unspecified: Secondary | ICD-10-CM | POA: Diagnosis not present

## 2021-12-27 DIAGNOSIS — M80021D Age-related osteoporosis with current pathological fracture, right humerus, subsequent encounter for fracture with routine healing: Secondary | ICD-10-CM | POA: Diagnosis not present

## 2021-12-27 NOTE — Telephone Encounter (Signed)
Requested Prescriptions  ?Pending Prescriptions Disp Refills  ?? spironolactone (ALDACTONE) 25 MG tablet [Pharmacy Med Name: SPIRONOLACTONE TABS 25MG ] 90 tablet 1  ?  Sig: TAKE 1 TABLET DAILY  ?  ? Cardiovascular: Diuretics - Aldosterone Antagonist Passed - 12/27/2021  3:22 AM  ?  ?  Passed - Cr in normal range and within 180 days  ?  Creatinine, Ser  ?Date Value Ref Range Status  ?12/14/2021 0.77 0.57 - 1.00 mg/dL Final  ?   ?  ?  Passed - K in normal range and within 180 days  ?  Potassium  ?Date Value Ref Range Status  ?12/14/2021 4.9 3.5 - 5.2 mmol/L Final  ?   ?  ?  Passed - Na in normal range and within 180 days  ?  Sodium  ?Date Value Ref Range Status  ?12/14/2021 136 134 - 144 mmol/L Final  ?   ?  ?  Passed - eGFR is 30 or above and within 180 days  ?  GFR calc Af Amer  ?Date Value Ref Range Status  ?08/28/2020 72 >59 mL/min/1.73 Final  ?  Comment:  ?  **In accordance with recommendations from the NKF-ASN Task force,** ?  [facility] is in the process of updating its eGFR calculation to the ?  2021 CKD-EPI creatinine equation that estimates kidney function ?  without a race variable. ?  ? ?GFR calc non Af Amer  ?Date Value Ref Range Status  ?08/28/2020 62 >59 mL/min/1.73 Final  ? ?eGFR  ?Date Value Ref Range Status  ?12/14/2021 77 >59 mL/min/1.73 Final  ?   ?  ?  Passed - Last BP in normal range  ?  BP Readings from Last 1 Encounters:  ?12/14/21 120/74  ?   ?  ?  Passed - Valid encounter within last 6 months  ?  Recent Outpatient Visits   ?      ? 1 week ago Essential (primary) hypertension  ? [facility] Glean Hess, [facility]  ? 7 months ago Type II diabetes mellitus with complication [facility])  ? [facility] Glean Hess, MD  ? 1 year ago Essential (primary) hypertension  ? [facility]an [facility], [facility]  ? 1 year ago Type II diabetes mellitus with complication [facility])  ? [facility] Glean Hess, MD  ? 1 year ago Type II diabetes mellitus with complication [facility])   ? [facility] Glean Hess, MD  ?  ?  ?Future Appointments   ?        ? In 3 months Army Melia Jesse Sans, MD [facility], [place]  ? In 11 months Glean Hess, MD [facility], [place]  ?  ? ?  ?  ?  ? ?

## 2021-12-31 ENCOUNTER — Encounter: Payer: Self-pay | Admitting: Podiatry

## 2021-12-31 ENCOUNTER — Ambulatory Visit (INDEPENDENT_AMBULATORY_CARE_PROVIDER_SITE_OTHER): Payer: Medicare Other | Admitting: Podiatry

## 2021-12-31 ENCOUNTER — Other Ambulatory Visit: Payer: Self-pay | Admitting: Internal Medicine

## 2021-12-31 DIAGNOSIS — M7742 Metatarsalgia, left foot: Secondary | ICD-10-CM

## 2021-12-31 DIAGNOSIS — L84 Corns and callosities: Secondary | ICD-10-CM | POA: Diagnosis not present

## 2021-12-31 DIAGNOSIS — B351 Tinea unguium: Secondary | ICD-10-CM

## 2021-12-31 DIAGNOSIS — E118 Type 2 diabetes mellitus with unspecified complications: Secondary | ICD-10-CM

## 2021-12-31 DIAGNOSIS — M7741 Metatarsalgia, right foot: Secondary | ICD-10-CM

## 2021-12-31 DIAGNOSIS — M79674 Pain in right toe(s): Secondary | ICD-10-CM

## 2021-12-31 DIAGNOSIS — M2042 Other hammer toe(s) (acquired), left foot: Secondary | ICD-10-CM | POA: Diagnosis not present

## 2021-12-31 DIAGNOSIS — M79675 Pain in left toe(s): Secondary | ICD-10-CM

## 2021-12-31 DIAGNOSIS — M2041 Other hammer toe(s) (acquired), right foot: Secondary | ICD-10-CM

## 2021-12-31 NOTE — Progress Notes (Signed)
?  Subjective:  ?Patient ID: Lori Harrell, female    DOB: September 22, 1941,  MRN: [id number] ? ?Chief Complaint  ?Patient presents with  ? Nail Problem  ? Callouses  ? Diabetes  ? ? ?81 y.o. female presents with the above complaint. History confirmed with patient. She has thickened elongated nails that have become misshapened and she is unable to cut them, there is a callus in the fifth toe and the toe is curling in.  Her A1c is well controlled ? ?Objective:  ?Physical Exam: ?warm, good capillary refill, no trophic changes or ulcerative lesions, normal DP and PT pulses, and normal sensory exam.  Thinning of fat pad bilateral ?Left Foot: dystrophic yellowed discolored nail plates with subungual debris and hammertoe deformities that are semireducible, there is a callus on the distal medial tip of the fifth toe ?Right Foot: dystrophic yellowed discolored nail plates with subungual debris and hammertoe deformities that are semireducible ? ?Assessment:  ? ?1. Hammertoe of right foot   ?2. Hammertoe of left foot   ?3. Callus of foot   ?4. Pain due to onychomycosis of toenails of both feet   ?5. Type II diabetes mellitus with complication (HCC)   ?6. Metatarsalgia of both feet   ? ? ? ?Plan:  ?Patient was evaluated and treated and all questions answered. ? ?Patient educated on diabetes. Discussed proper diabetic foot care and discussed risks and complications of disease. Educated patient in depth on reasons to return to the office immediately should he/she discover anything concerning or new on the feet. All questions answered. Discussed proper shoes as well.  ? ?With her hammertoe deformities and metatarsalgia she is at risk of ulceration.  We discussed offloading with extra-depth diabetic shoe and a multidensity insert.  She will be scheduled to see our pedorthist for fitting for these. ? ?Discussed the etiology and treatment options for the condition in detail with the patient. Educated patient on the topical and oral  treatment options for mycotic nails. Recommended debridement of the nails today. Sharp and mechanical debridement performed of all painful and mycotic nails today. Nails debrided in length and thickness using a nail nipper to level of comfort. Discussed treatment options including appropriate shoe gear. Follow up as needed for painful nails. ? ? ?All symptomatic hyperkeratoses were safely debrided with a sterile #15 blade to patient's level of comfort without incident. We discussed preventative and palliative care of these lesions ? ? ?Return in about 3 months (around 04/01/2022) for at risk diabetic foot care.  ? ?

## 2022-01-01 DIAGNOSIS — I10 Essential (primary) hypertension: Secondary | ICD-10-CM | POA: Diagnosis not present

## 2022-01-01 DIAGNOSIS — K2270 Barrett's esophagus without dysplasia: Secondary | ICD-10-CM | POA: Diagnosis not present

## 2022-01-01 DIAGNOSIS — M80021D Age-related osteoporosis with current pathological fracture, right humerus, subsequent encounter for fracture with routine healing: Secondary | ICD-10-CM | POA: Diagnosis not present

## 2022-01-01 DIAGNOSIS — D649 Anemia, unspecified: Secondary | ICD-10-CM | POA: Diagnosis not present

## 2022-01-01 DIAGNOSIS — E119 Type 2 diabetes mellitus without complications: Secondary | ICD-10-CM | POA: Diagnosis not present

## 2022-01-01 DIAGNOSIS — E7800 Pure hypercholesterolemia, unspecified: Secondary | ICD-10-CM | POA: Diagnosis not present

## 2022-01-01 NOTE — Telephone Encounter (Signed)
Requested Prescriptions  ?Pending Prescriptions Disp Refills  ?? metFORMIN (GLUCOPHAGE) 500 MG tablet [Pharmacy Med Name: METFORMIN HCL TABS $Remove'500MG'[id number]$ ] 90 tablet 3  ?  Sig: TAKE 1 TABLET DAILY  ?  ? Endocrinology:  Diabetes - Biguanides Failed - 12/31/2021  3:32 AM  ?  ?  Failed - B12 Level in normal range and within 720 days  ?  No results found for: VITAMINB12   ?  ?  Passed - Cr in normal range and within 360 days  ?  Creatinine, Ser  ?Date Value Ref Range Status  ?12/14/2021 0.77 0.57 - 1.00 mg/dL Final  ?   ?  ?  Passed - HBA1C is between 0 and 7.9 and within 180 days  ?  Hgb A1c MFr Bld  ?Date Value Ref Range Status  ?12/14/2021 5.8 (H) 4.8 - 5.6 % Final  ?  Comment:  ?           Prediabetes: 5.7 - 6.4 ?         Diabetes: >6.4 ?         Glycemic control for adults with diabetes: <7.0 ?  ?   ?  ?  Passed - eGFR in normal range and within 360 days  ?  GFR calc Af Amer  ?Date Value Ref Range Status  ?08/28/2020 72 >59 mL/min/1.73 Final  ?  Comment:  ?  **In accordance with recommendations from the NKF-ASN Task force,** ?  [facility] is in the process of updating its eGFR calculation to the ?  2021 CKD-EPI creatinine equation that estimates kidney function ?  without a race variable. ?  ? ?GFR calc non Af Amer  ?Date Value Ref Range Status  ?08/28/2020 62 >59 mL/min/1.73 Final  ? ?eGFR  ?Date Value Ref Range Status  ?12/14/2021 77 >59 mL/min/1.73 Final  ?   ?  ?  Passed - Valid encounter within last 6 months  ?  Recent Outpatient Visits   ?      ? 2 weeks ago Essential (primary) hypertension  ? [facility] Glean [facility], MD  ? 7 months ago Type II diabetes mellitus with complication [facility])  ? [facility] Glean Hess, MD  ? 1 year ago Essential (primary) hypertension  ? [facility] Glean Hess, MD  ? 1 year ago Type II diabetes mellitus with complication [facility])  ? [facility] Glean Hess, MD  ? 1 year ago Type II diabetes mellitus with complication [facility])  ? [facility] Glean Hess, MD  ?  ?  ?Future Appointments   ?        ? In 3 months Army Melia Jesse Sans, MD [facility], [place]  ? In 11 months Glean Hess, MD [facility]  ?  ? ?  ?  ?  Passed - CBC within normal limits and completed in the last 12 months  ?  WBC  ?Date Value Ref Range Status  ?12/14/2021 7.9 3.4 - 10.8 x10E3/uL Final  ?05/10/2019 7.5 4.0 - 10.5 K/uL Final  ? ?RBC  ?Date Value Ref Range Status  ?12/14/2021 4.10 3.77 - 5.28 x10E6/uL Final  ?05/10/2019 4.26 3.87 - 5.11 MIL/uL Final  ? ?Hemoglobin  ?Date Value Ref Range Status  ?12/14/2021 13.0 11.1 - 15.9 g/dL Final  ? ?Hematocrit  ?Date Value Ref Range Status  ?12/14/2021 38.3 34.0 - 46.6 % Final  ? ?MCHC  ?Date Value Ref Range Status  ?  12/14/2021 33.9 31.5 - 35.7 g/dL Final  ?05/10/2019 31.9 30.0 - 36.0 g/dL Final  ? ?MCH  ?Date Value Ref Range Status  ?12/14/2021 31.7 26.6 - 33.0 pg Final  ?05/10/2019 29.8 26.0 - 34.0 pg Final  ? ?MCV  ?Date Value Ref Range Status  ?12/14/2021 93 79 - 97 fL Final  ? ?No results found for: PLTCOUNTKUC, LABPLAT, [place] ?RDW  ?Date Value Ref Range Status  ?12/14/2021 12.3 11.7 - 15.4 % Final  ? ?  ?  ?  ? ? ?

## 2022-01-03 DIAGNOSIS — E119 Type 2 diabetes mellitus without complications: Secondary | ICD-10-CM | POA: Diagnosis not present

## 2022-01-03 DIAGNOSIS — K2270 Barrett's esophagus without dysplasia: Secondary | ICD-10-CM | POA: Diagnosis not present

## 2022-01-03 DIAGNOSIS — D649 Anemia, unspecified: Secondary | ICD-10-CM | POA: Diagnosis not present

## 2022-01-03 DIAGNOSIS — E7800 Pure hypercholesterolemia, unspecified: Secondary | ICD-10-CM | POA: Diagnosis not present

## 2022-01-03 DIAGNOSIS — M80021D Age-related osteoporosis with current pathological fracture, right humerus, subsequent encounter for fracture with routine healing: Secondary | ICD-10-CM | POA: Diagnosis not present

## 2022-01-03 DIAGNOSIS — I10 Essential (primary) hypertension: Secondary | ICD-10-CM | POA: Diagnosis not present

## 2022-01-07 DIAGNOSIS — I10 Essential (primary) hypertension: Secondary | ICD-10-CM | POA: Diagnosis not present

## 2022-01-07 DIAGNOSIS — E119 Type 2 diabetes mellitus without complications: Secondary | ICD-10-CM | POA: Diagnosis not present

## 2022-01-07 DIAGNOSIS — D649 Anemia, unspecified: Secondary | ICD-10-CM | POA: Diagnosis not present

## 2022-01-07 DIAGNOSIS — M80021D Age-related osteoporosis with current pathological fracture, right humerus, subsequent encounter for fracture with routine healing: Secondary | ICD-10-CM | POA: Diagnosis not present

## 2022-01-07 DIAGNOSIS — K2270 Barrett's esophagus without dysplasia: Secondary | ICD-10-CM | POA: Diagnosis not present

## 2022-01-07 DIAGNOSIS — E7800 Pure hypercholesterolemia, unspecified: Secondary | ICD-10-CM | POA: Diagnosis not present

## 2022-01-08 DIAGNOSIS — I10 Essential (primary) hypertension: Secondary | ICD-10-CM | POA: Diagnosis not present

## 2022-01-08 DIAGNOSIS — E7800 Pure hypercholesterolemia, unspecified: Secondary | ICD-10-CM | POA: Diagnosis not present

## 2022-01-08 DIAGNOSIS — E119 Type 2 diabetes mellitus without complications: Secondary | ICD-10-CM | POA: Diagnosis not present

## 2022-01-08 DIAGNOSIS — K2270 Barrett's esophagus without dysplasia: Secondary | ICD-10-CM | POA: Diagnosis not present

## 2022-01-08 DIAGNOSIS — D649 Anemia, unspecified: Secondary | ICD-10-CM | POA: Diagnosis not present

## 2022-01-08 DIAGNOSIS — M80021D Age-related osteoporosis with current pathological fracture, right humerus, subsequent encounter for fracture with routine healing: Secondary | ICD-10-CM | POA: Diagnosis not present

## 2022-01-11 DIAGNOSIS — Z9049 Acquired absence of other specified parts of digestive tract: Secondary | ICD-10-CM | POA: Diagnosis not present

## 2022-01-11 DIAGNOSIS — K449 Diaphragmatic hernia without obstruction or gangrene: Secondary | ICD-10-CM | POA: Diagnosis not present

## 2022-01-11 DIAGNOSIS — H409 Unspecified glaucoma: Secondary | ICD-10-CM | POA: Diagnosis not present

## 2022-01-11 DIAGNOSIS — Z90710 Acquired absence of both cervix and uterus: Secondary | ICD-10-CM | POA: Diagnosis not present

## 2022-01-11 DIAGNOSIS — K2270 Barrett's esophagus without dysplasia: Secondary | ICD-10-CM | POA: Diagnosis not present

## 2022-01-11 DIAGNOSIS — M80021D Age-related osteoporosis with current pathological fracture, right humerus, subsequent encounter for fracture with routine healing: Secondary | ICD-10-CM | POA: Diagnosis not present

## 2022-01-11 DIAGNOSIS — Z981 Arthrodesis status: Secondary | ICD-10-CM | POA: Diagnosis not present

## 2022-01-11 DIAGNOSIS — D649 Anemia, unspecified: Secondary | ICD-10-CM | POA: Diagnosis not present

## 2022-01-11 DIAGNOSIS — E119 Type 2 diabetes mellitus without complications: Secondary | ICD-10-CM | POA: Diagnosis not present

## 2022-01-11 DIAGNOSIS — I10 Essential (primary) hypertension: Secondary | ICD-10-CM | POA: Diagnosis not present

## 2022-01-11 DIAGNOSIS — K219 Gastro-esophageal reflux disease without esophagitis: Secondary | ICD-10-CM | POA: Diagnosis not present

## 2022-01-11 DIAGNOSIS — M25511 Pain in right shoulder: Secondary | ICD-10-CM | POA: Diagnosis not present

## 2022-01-11 DIAGNOSIS — Z9181 History of falling: Secondary | ICD-10-CM | POA: Diagnosis not present

## 2022-01-11 DIAGNOSIS — N393 Stress incontinence (female) (male): Secondary | ICD-10-CM | POA: Diagnosis not present

## 2022-01-11 DIAGNOSIS — K5730 Diverticulosis of large intestine without perforation or abscess without bleeding: Secondary | ICD-10-CM | POA: Diagnosis not present

## 2022-01-11 DIAGNOSIS — E7800 Pure hypercholesterolemia, unspecified: Secondary | ICD-10-CM | POA: Diagnosis not present

## 2022-01-11 DIAGNOSIS — H33053 Total retinal detachment, bilateral: Secondary | ICD-10-CM | POA: Diagnosis not present

## 2022-01-11 DIAGNOSIS — Z7984 Long term (current) use of oral hypoglycemic drugs: Secondary | ICD-10-CM | POA: Diagnosis not present

## 2022-01-11 DIAGNOSIS — K8020 Calculus of gallbladder without cholecystitis without obstruction: Secondary | ICD-10-CM | POA: Diagnosis not present

## 2022-01-14 DIAGNOSIS — Z20822 Contact with and (suspected) exposure to covid-19: Secondary | ICD-10-CM | POA: Diagnosis not present

## 2022-01-15 DIAGNOSIS — M80021D Age-related osteoporosis with current pathological fracture, right humerus, subsequent encounter for fracture with routine healing: Secondary | ICD-10-CM | POA: Diagnosis not present

## 2022-01-15 DIAGNOSIS — E7800 Pure hypercholesterolemia, unspecified: Secondary | ICD-10-CM | POA: Diagnosis not present

## 2022-01-15 DIAGNOSIS — I10 Essential (primary) hypertension: Secondary | ICD-10-CM | POA: Diagnosis not present

## 2022-01-15 DIAGNOSIS — D649 Anemia, unspecified: Secondary | ICD-10-CM | POA: Diagnosis not present

## 2022-01-15 DIAGNOSIS — E119 Type 2 diabetes mellitus without complications: Secondary | ICD-10-CM | POA: Diagnosis not present

## 2022-01-15 DIAGNOSIS — K2270 Barrett's esophagus without dysplasia: Secondary | ICD-10-CM | POA: Diagnosis not present

## 2022-01-17 ENCOUNTER — Other Ambulatory Visit: Payer: Self-pay | Admitting: Orthopedic Surgery

## 2022-01-17 DIAGNOSIS — M25512 Pain in left shoulder: Secondary | ICD-10-CM

## 2022-01-17 DIAGNOSIS — M25511 Pain in right shoulder: Secondary | ICD-10-CM | POA: Diagnosis not present

## 2022-01-21 ENCOUNTER — Other Ambulatory Visit: Payer: Self-pay | Admitting: Orthopedic Surgery

## 2022-01-23 DIAGNOSIS — D649 Anemia, unspecified: Secondary | ICD-10-CM | POA: Diagnosis not present

## 2022-01-23 DIAGNOSIS — K2270 Barrett's esophagus without dysplasia: Secondary | ICD-10-CM | POA: Diagnosis not present

## 2022-01-23 DIAGNOSIS — I10 Essential (primary) hypertension: Secondary | ICD-10-CM | POA: Diagnosis not present

## 2022-01-23 DIAGNOSIS — E119 Type 2 diabetes mellitus without complications: Secondary | ICD-10-CM | POA: Diagnosis not present

## 2022-01-23 DIAGNOSIS — E7800 Pure hypercholesterolemia, unspecified: Secondary | ICD-10-CM | POA: Diagnosis not present

## 2022-01-23 DIAGNOSIS — M80021D Age-related osteoporosis with current pathological fracture, right humerus, subsequent encounter for fracture with routine healing: Secondary | ICD-10-CM | POA: Diagnosis not present

## 2022-01-28 DIAGNOSIS — D649 Anemia, unspecified: Secondary | ICD-10-CM | POA: Diagnosis not present

## 2022-01-28 DIAGNOSIS — I10 Essential (primary) hypertension: Secondary | ICD-10-CM | POA: Diagnosis not present

## 2022-01-28 DIAGNOSIS — K2270 Barrett's esophagus without dysplasia: Secondary | ICD-10-CM | POA: Diagnosis not present

## 2022-01-28 DIAGNOSIS — E119 Type 2 diabetes mellitus without complications: Secondary | ICD-10-CM | POA: Diagnosis not present

## 2022-01-28 DIAGNOSIS — M80021D Age-related osteoporosis with current pathological fracture, right humerus, subsequent encounter for fracture with routine healing: Secondary | ICD-10-CM | POA: Diagnosis not present

## 2022-01-28 DIAGNOSIS — E7800 Pure hypercholesterolemia, unspecified: Secondary | ICD-10-CM | POA: Diagnosis not present

## 2022-01-31 ENCOUNTER — Ambulatory Visit
Admission: RE | Admit: 2022-01-31 | Discharge: 2022-01-31 | Disposition: A | Discharge status: Home / Self Care | Payer: Medicare Other | Source: Ambulatory Visit | Attending: Orthopedic Surgery | Admitting: Orthopedic Surgery

## 2022-01-31 DIAGNOSIS — M25512 Pain in left shoulder: Secondary | ICD-10-CM | POA: Diagnosis not present

## 2022-01-31 DIAGNOSIS — M19012 Primary osteoarthritis, left shoulder: Secondary | ICD-10-CM | POA: Diagnosis not present

## 2022-01-31 DIAGNOSIS — Z01818 Encounter for other preprocedural examination: Secondary | ICD-10-CM | POA: Diagnosis not present

## 2022-02-01 ENCOUNTER — Other Ambulatory Visit: Payer: Medicare Other

## 2022-02-04 ENCOUNTER — Ambulatory Visit
Admission: RE | Admit: 2022-02-04 | Discharge: 2022-02-04 | Disposition: A | Discharge status: Home / Self Care | Payer: Medicare Other | Source: Ambulatory Visit | Attending: Internal Medicine | Admitting: Internal Medicine

## 2022-02-04 DIAGNOSIS — D649 Anemia, unspecified: Secondary | ICD-10-CM | POA: Diagnosis not present

## 2022-02-04 DIAGNOSIS — M80021D Age-related osteoporosis with current pathological fracture, right humerus, subsequent encounter for fracture with routine healing: Secondary | ICD-10-CM | POA: Diagnosis not present

## 2022-02-04 DIAGNOSIS — M810 Age-related osteoporosis without current pathological fracture: Secondary | ICD-10-CM | POA: Insufficient documentation

## 2022-02-04 DIAGNOSIS — E7800 Pure hypercholesterolemia, unspecified: Secondary | ICD-10-CM | POA: Diagnosis not present

## 2022-02-04 DIAGNOSIS — Z780 Asymptomatic menopausal state: Secondary | ICD-10-CM | POA: Diagnosis not present

## 2022-02-04 DIAGNOSIS — I10 Essential (primary) hypertension: Secondary | ICD-10-CM | POA: Diagnosis not present

## 2022-02-04 DIAGNOSIS — K2270 Barrett's esophagus without dysplasia: Secondary | ICD-10-CM | POA: Diagnosis not present

## 2022-02-04 DIAGNOSIS — M85851 Other specified disorders of bone density and structure, right thigh: Secondary | ICD-10-CM | POA: Diagnosis not present

## 2022-02-04 DIAGNOSIS — E119 Type 2 diabetes mellitus without complications: Secondary | ICD-10-CM | POA: Diagnosis not present

## 2022-02-06 ENCOUNTER — Encounter
Admission: RE | Admit: 2022-02-06 | Discharge: 2022-02-06 | Disposition: A | Discharge status: Home / Self Care | Payer: Medicare Other | Source: Ambulatory Visit | Attending: Orthopedic Surgery | Admitting: Orthopedic Surgery

## 2022-02-06 DIAGNOSIS — Z01812 Encounter for preprocedural laboratory examination: Secondary | ICD-10-CM

## 2022-02-06 DIAGNOSIS — Z01818 Encounter for other preprocedural examination: Secondary | ICD-10-CM | POA: Insufficient documentation

## 2022-02-06 DIAGNOSIS — Z01810 Encounter for preprocedural cardiovascular examination: Secondary | ICD-10-CM | POA: Diagnosis not present

## 2022-02-06 HISTORY — DX: Age-related osteoporosis without current pathological fracture: M81.0

## 2022-02-06 HISTORY — DX: Barrett's esophagus without dysplasia: K22.70

## 2022-02-06 HISTORY — DX: Personal history of other diseases of the digestive system: Z87.19

## 2022-02-06 HISTORY — DX: Anemia, unspecified: D64.9

## 2022-02-06 HISTORY — DX: Pure hypercholesterolemia, unspecified: E78.00

## 2022-02-06 HISTORY — DX: Diverticulitis of large intestine without perforation or abscess without bleeding: K57.32

## 2022-02-06 HISTORY — DX: Cardiac murmur, unspecified: R01.1

## 2022-02-06 LAB — COMPREHENSIVE METABOLIC PANEL
ALT: 20 U/L (ref 0–44)
AST: 23 U/L (ref 15–41)
Albumin: 4 g/dL (ref 3.5–5.0)
Alkaline Phosphatase: 54 U/L (ref 38–126)
Anion gap: 5 (ref 5–15)
BUN: 12 mg/dL (ref 8–23)
CO2: 25 mmol/L (ref 22–32)
Calcium: 9.5 mg/dL (ref 8.9–10.3)
Chloride: 106 mmol/L (ref 98–111)
Creatinine, Ser: 0.65 mg/dL (ref 0.44–1.00)
GFR, Estimated: >60 mL/min (ref >60)
Glucose, Bld: 96 mg/dL (ref 70–99)
Potassium: 4.3 mmol/L (ref 3.5–5.1)
Sodium: 136 mmol/L (ref 135–145)
Total Bilirubin: 0.7 mg/dL (ref 0.3–1.2)
Total Protein: 6.9 g/dL (ref 6.5–8.1)

## 2022-02-06 LAB — URINALYSIS, ROUTINE W REFLEX MICROSCOPIC
Bilirubin Urine: NEGATIVE
Glucose, UA: NEGATIVE mg/dL
Hgb urine dipstick: NEGATIVE
Ketones, ur: NEGATIVE mg/dL
Leukocytes,Ua: NEGATIVE
Nitrite: NEGATIVE
Protein, ur: NEGATIVE mg/dL
Specific Gravity, Urine: 1.006 (ref 1.005–1.030)
pH: 6 (ref 5.0–8.0)

## 2022-02-06 LAB — CBC WITH DIFFERENTIAL/PLATELET
Abs Immature Granulocytes: 0.01 10*3/uL (ref 0.00–0.07)
Basophils Absolute: 0.1 10*3/uL (ref 0.0–0.1)
Basophils Relative: 1 %
Eosinophils Absolute: 0.1 10*3/uL (ref 0.0–0.5)
Eosinophils Relative: 1 %
HCT: 37.6 % (ref 36.0–46.0)
Hemoglobin: 12.2 g/dL (ref 12.0–15.0)
Immature Granulocytes: 0 %
Lymphocytes Relative: 23 %
Lymphs Abs: 1.4 10*3/uL (ref 0.7–4.0)
MCH: 30.3 pg (ref 26.0–34.0)
MCHC: 32.4 g/dL (ref 30.0–36.0)
MCV: 93.3 fL (ref 80.0–100.0)
Monocytes Absolute: 0.4 10*3/uL (ref 0.1–1.0)
Monocytes Relative: 7 %
Neutro Abs: 4.1 10*3/uL (ref 1.7–7.7)
Neutrophils Relative %: 68 %
Platelets: 210 10*3/uL (ref 150–400)
RBC: 4.03 MIL/uL (ref 3.87–5.11)
RDW: 13 % (ref 11.5–15.5)
WBC: 6.1 10*3/uL (ref 4.0–10.5)
nRBC: 0 % (ref 0.0–0.2)

## 2022-02-06 LAB — SURGICAL PCR SCREEN
MRSA, PCR: NEGATIVE
Staphylococcus aureus: NEGATIVE

## 2022-02-06 NOTE — Patient Instructions (Addendum)
Your procedure is scheduled on: Monday, May 22 ?Report to the Registration Desk on the 1st floor of the [facility]. ?To find out your arrival time, please call [phone] between 1PM - 3PM on: Friday, May 19 ?If your arrival time is 6:00 am, do not arrive prior to that time as the [facility] entrance doors do not open until 6:00 am. ? ?REMEMBER: ?Instructions that are not followed completely may result in serious medical risk, up to and including death; or upon the discretion of your surgeon and anesthesiologist your surgery may need to be rescheduled. ? ?Do not eat food after midnight the night before surgery.  ?No gum chewing, lozengers or hard candies. ? ?You may however, drink water up to 2 hours before you are scheduled to arrive for your surgery. Do not drink anything within 2 hours of your scheduled arrival time. ? ?In addition, your doctor has ordered for you to drink the provided  ?Gatorade G2 ?Drinking this carbohydrate drink up to two hours before surgery helps to reduce insulin resistance and improve patient outcomes. Please complete drinking 2 hours prior to scheduled arrival time. ? ?TAKE THESE MEDICATIONS THE MORNING OF SURGERY WITH A SIP OF WATER: ? ?Dorzolamide (Cosopt) eye drops ?Esomeprazole (Nexium) - (take one the night before and one on the morning of surgery - helps to prevent nausea after surgery.) ? ?Stop metformin 2 days prior to surgery. Last day to take metformin is Friday, May 19. Resume AFTER surgery. ? ?One week prior to surgery: starting May 15 ?Stop Anti-inflammatories (NSAIDS) such as Advil, Aleve, Ibuprofen, Motrin, Naproxen, Naprosyn and Aspirin based products such as Excedrin, Goodys Powder, BC Powder. ?Stop ANY OVER THE COUNTER supplements until after surgery. Stop multiple vitamins, calcium. ?You may however, continue to take Tylenol if needed for pain up until the day of surgery. ? ?No Alcohol for 24 hours before or after surgery. ? ?No Smoking including e-cigarettes  for 24 hours prior to surgery.  ?No chewable tobacco products for at least 6 hours prior to surgery.  ?No nicotine patches on the day of surgery. ? ?Do not use any "recreational" drugs for at least a week prior to your surgery.  ?Please be advised that the combination of cocaine and anesthesia may have negative outcomes, up to and including death. ?If you test positive for cocaine, your surgery will be cancelled. ? ?On the morning of surgery brush your teeth with toothpaste and water, you may rinse your mouth with mouthwash if you wish. ?Do not swallow any toothpaste or mouthwash. ? ?Use CHG Soap as directed on instruction sheet. ? ?Do not wear jewelry, make-up, hairpins, clips or nail polish. ? ?Do not wear lotions, powders, or perfumes.  ? ?Do not shave body from the neck down 48 hours prior to surgery just in case you cut yourself which could leave a site for infection.  ?Also, freshly shaved skin may become irritated if using the CHG soap. ? ?Contact lenses, hearing aids and dentures may not be worn into surgery. ? ?Do not bring valuables to the hospital. [facility] is not responsible for any missing/lost belongings or valuables.  ? ?Total Shoulder Arthroplasty:  use Benzolyl Peroxide 5% Gel as directed on instruction sheet. ? ?Notify your doctor if there is any change in your medical condition (cold, fever, infection). ? ?Wear comfortable clothing (specific to your surgery type) to the hospital. ? ?After surgery, you can help prevent lung complications by doing breathing exercises.  ?Take deep breaths and  cough every 1-2 hours. Your doctor may order a device called an Incentive Spirometer to help you take deep breaths. ? ?If you are being admitted to the hospital overnight, leave your suitcase in the car. ?After surgery it may be brought to your room. ? ?If you are being discharged the day of surgery, you will not be allowed to drive home. ?You will need a responsible adult (18 years or older) to drive you  home and stay with you that night.  ? ?If you are taking public transportation, you will need to have a responsible adult (18 years or older) with you. ?Please confirm with your physician that it is acceptable to use public transportation.  ? ?Please call the [facility] at [phone] if you have any questions about these instructions. ? ?Surgery Visitation Policy: ? ?Patients undergoing a surgery or procedure may have two family members or support persons with them as long as the person is not COVID-19 positive or experiencing its symptoms.  ? ?Inpatient Visitation:   ? ?Visiting hours are 7 a.m. to 8 p.m. ?Up to four visitors are allowed at one time in a patient room, including children. The visitors may rotate out with other people during the day. One designated support person (adult) may remain overnight.  ?

## 2022-02-10 DIAGNOSIS — M80021D Age-related osteoporosis with current pathological fracture, right humerus, subsequent encounter for fracture with routine healing: Secondary | ICD-10-CM | POA: Diagnosis not present

## 2022-02-10 DIAGNOSIS — K449 Diaphragmatic hernia without obstruction or gangrene: Secondary | ICD-10-CM | POA: Diagnosis not present

## 2022-02-10 DIAGNOSIS — I10 Essential (primary) hypertension: Secondary | ICD-10-CM | POA: Diagnosis not present

## 2022-02-10 DIAGNOSIS — K5730 Diverticulosis of large intestine without perforation or abscess without bleeding: Secondary | ICD-10-CM | POA: Diagnosis not present

## 2022-02-10 DIAGNOSIS — E7800 Pure hypercholesterolemia, unspecified: Secondary | ICD-10-CM | POA: Diagnosis not present

## 2022-02-10 DIAGNOSIS — Z9049 Acquired absence of other specified parts of digestive tract: Secondary | ICD-10-CM | POA: Diagnosis not present

## 2022-02-10 DIAGNOSIS — M25511 Pain in right shoulder: Secondary | ICD-10-CM | POA: Diagnosis not present

## 2022-02-10 DIAGNOSIS — N393 Stress incontinence (female) (male): Secondary | ICD-10-CM | POA: Diagnosis not present

## 2022-02-10 DIAGNOSIS — D649 Anemia, unspecified: Secondary | ICD-10-CM | POA: Diagnosis not present

## 2022-02-10 DIAGNOSIS — Z981 Arthrodesis status: Secondary | ICD-10-CM | POA: Diagnosis not present

## 2022-02-10 DIAGNOSIS — K2270 Barrett's esophagus without dysplasia: Secondary | ICD-10-CM | POA: Diagnosis not present

## 2022-02-10 DIAGNOSIS — H409 Unspecified glaucoma: Secondary | ICD-10-CM | POA: Diagnosis not present

## 2022-02-10 DIAGNOSIS — K8020 Calculus of gallbladder without cholecystitis without obstruction: Secondary | ICD-10-CM | POA: Diagnosis not present

## 2022-02-10 DIAGNOSIS — Z7984 Long term (current) use of oral hypoglycemic drugs: Secondary | ICD-10-CM | POA: Diagnosis not present

## 2022-02-10 DIAGNOSIS — K219 Gastro-esophageal reflux disease without esophagitis: Secondary | ICD-10-CM | POA: Diagnosis not present

## 2022-02-10 DIAGNOSIS — Z9181 History of falling: Secondary | ICD-10-CM | POA: Diagnosis not present

## 2022-02-10 DIAGNOSIS — Z90710 Acquired absence of both cervix and uterus: Secondary | ICD-10-CM | POA: Diagnosis not present

## 2022-02-10 DIAGNOSIS — H33053 Total retinal detachment, bilateral: Secondary | ICD-10-CM | POA: Diagnosis not present

## 2022-02-10 DIAGNOSIS — E119 Type 2 diabetes mellitus without complications: Secondary | ICD-10-CM | POA: Diagnosis not present

## 2022-02-11 DIAGNOSIS — E119 Type 2 diabetes mellitus without complications: Secondary | ICD-10-CM | POA: Diagnosis not present

## 2022-02-11 DIAGNOSIS — I10 Essential (primary) hypertension: Secondary | ICD-10-CM | POA: Diagnosis not present

## 2022-02-11 DIAGNOSIS — K2270 Barrett's esophagus without dysplasia: Secondary | ICD-10-CM | POA: Diagnosis not present

## 2022-02-11 DIAGNOSIS — E7800 Pure hypercholesterolemia, unspecified: Secondary | ICD-10-CM | POA: Diagnosis not present

## 2022-02-11 DIAGNOSIS — D649 Anemia, unspecified: Secondary | ICD-10-CM | POA: Diagnosis not present

## 2022-02-11 DIAGNOSIS — M80021D Age-related osteoporosis with current pathological fracture, right humerus, subsequent encounter for fracture with routine healing: Secondary | ICD-10-CM | POA: Diagnosis not present

## 2022-02-14 DIAGNOSIS — M87019 Idiopathic aseptic necrosis of unspecified shoulder: Secondary | ICD-10-CM | POA: Diagnosis not present

## 2022-02-17 DIAGNOSIS — D649 Anemia, unspecified: Secondary | ICD-10-CM | POA: Diagnosis not present

## 2022-02-17 DIAGNOSIS — E119 Type 2 diabetes mellitus without complications: Secondary | ICD-10-CM | POA: Diagnosis not present

## 2022-02-17 DIAGNOSIS — K2270 Barrett's esophagus without dysplasia: Secondary | ICD-10-CM | POA: Diagnosis not present

## 2022-02-17 DIAGNOSIS — M80021D Age-related osteoporosis with current pathological fracture, right humerus, subsequent encounter for fracture with routine healing: Secondary | ICD-10-CM | POA: Diagnosis not present

## 2022-02-17 DIAGNOSIS — I10 Essential (primary) hypertension: Secondary | ICD-10-CM | POA: Diagnosis not present

## 2022-02-17 DIAGNOSIS — E7800 Pure hypercholesterolemia, unspecified: Secondary | ICD-10-CM | POA: Diagnosis not present

## 2022-02-18 ENCOUNTER — Other Ambulatory Visit: Payer: Self-pay

## 2022-02-18 ENCOUNTER — Encounter: Payer: Self-pay | Admitting: Orthopedic Surgery

## 2022-02-18 ENCOUNTER — Ambulatory Visit: Payer: Medicare Other | Admitting: Anesthesiology

## 2022-02-18 ENCOUNTER — Ambulatory Visit: Payer: Medicare Other | Admitting: Urgent Care

## 2022-02-18 ENCOUNTER — Inpatient Hospital Stay: Payer: Medicare Other

## 2022-02-18 ENCOUNTER — Ambulatory Visit: Payer: Medicare Other

## 2022-02-18 ENCOUNTER — Encounter
Admission: AD | Disposition: A | Discharge status: Skilled Nursing Facility | Payer: Self-pay | Source: Home / Self Care | Attending: Orthopedic Surgery

## 2022-02-18 ENCOUNTER — Inpatient Hospital Stay
Admission: AD | Admit: 2022-02-18 | Discharge: 2022-02-21 | DRG: 483 | Disposition: A | Discharge status: Skilled Nursing Facility | Payer: Medicare Other | Attending: Orthopedic Surgery | Admitting: Orthopedic Surgery

## 2022-02-18 DIAGNOSIS — Z79899 Other long term (current) drug therapy: Secondary | ICD-10-CM | POA: Diagnosis not present

## 2022-02-18 DIAGNOSIS — Z01812 Encounter for preprocedural laboratory examination: Principal | ICD-10-CM

## 2022-02-18 DIAGNOSIS — I10 Essential (primary) hypertension: Secondary | ICD-10-CM | POA: Diagnosis present

## 2022-02-18 DIAGNOSIS — X58XXXD Exposure to other specified factors, subsequent encounter: Secondary | ICD-10-CM | POA: Diagnosis present

## 2022-02-18 DIAGNOSIS — E119 Type 2 diabetes mellitus without complications: Secondary | ICD-10-CM | POA: Diagnosis present

## 2022-02-18 DIAGNOSIS — E7800 Pure hypercholesterolemia, unspecified: Secondary | ICD-10-CM | POA: Diagnosis present

## 2022-02-18 DIAGNOSIS — Z8249 Family history of ischemic heart disease and other diseases of the circulatory system: Secondary | ICD-10-CM | POA: Diagnosis not present

## 2022-02-18 DIAGNOSIS — G8929 Other chronic pain: Secondary | ICD-10-CM | POA: Diagnosis present

## 2022-02-18 DIAGNOSIS — Z85828 Personal history of other malignant neoplasm of skin: Secondary | ICD-10-CM

## 2022-02-18 DIAGNOSIS — H409 Unspecified glaucoma: Secondary | ICD-10-CM | POA: Diagnosis present

## 2022-02-18 DIAGNOSIS — Z471 Aftercare following joint replacement surgery: Secondary | ICD-10-CM | POA: Diagnosis not present

## 2022-02-18 DIAGNOSIS — Z9889 Other specified postprocedural states: Secondary | ICD-10-CM | POA: Diagnosis not present

## 2022-02-18 DIAGNOSIS — Z96642 Presence of left artificial hip joint: Secondary | ICD-10-CM | POA: Diagnosis not present

## 2022-02-18 DIAGNOSIS — M87012 Idiopathic aseptic necrosis of left shoulder: Secondary | ICD-10-CM | POA: Diagnosis not present

## 2022-02-18 DIAGNOSIS — Z96612 Presence of left artificial shoulder joint: Secondary | ICD-10-CM | POA: Diagnosis not present

## 2022-02-18 DIAGNOSIS — Z833 Family history of diabetes mellitus: Secondary | ICD-10-CM

## 2022-02-18 DIAGNOSIS — S42202P Unspecified fracture of upper end of left humerus, subsequent encounter for fracture with malunion: Secondary | ICD-10-CM

## 2022-02-18 DIAGNOSIS — M8580 Other specified disorders of bone density and structure, unspecified site: Secondary | ICD-10-CM | POA: Diagnosis not present

## 2022-02-18 DIAGNOSIS — K219 Gastro-esophageal reflux disease without esophagitis: Secondary | ICD-10-CM | POA: Diagnosis present

## 2022-02-18 DIAGNOSIS — R011 Cardiac murmur, unspecified: Secondary | ICD-10-CM | POA: Diagnosis not present

## 2022-02-18 DIAGNOSIS — K2270 Barrett's esophagus without dysplasia: Secondary | ICD-10-CM | POA: Diagnosis present

## 2022-02-18 DIAGNOSIS — M810 Age-related osteoporosis without current pathological fracture: Secondary | ICD-10-CM | POA: Diagnosis present

## 2022-02-18 DIAGNOSIS — M879 Osteonecrosis, unspecified: Principal | ICD-10-CM | POA: Diagnosis present

## 2022-02-18 DIAGNOSIS — Z96619 Presence of unspecified artificial shoulder joint: Secondary | ICD-10-CM

## 2022-02-18 DIAGNOSIS — Z7984 Long term (current) use of oral hypoglycemic drugs: Secondary | ICD-10-CM | POA: Diagnosis not present

## 2022-02-18 DIAGNOSIS — D649 Anemia, unspecified: Secondary | ICD-10-CM | POA: Diagnosis not present

## 2022-02-18 DIAGNOSIS — Z7982 Long term (current) use of aspirin: Secondary | ICD-10-CM | POA: Diagnosis not present

## 2022-02-18 DIAGNOSIS — K5732 Diverticulitis of large intestine without perforation or abscess without bleeding: Secondary | ICD-10-CM | POA: Diagnosis not present

## 2022-02-18 DIAGNOSIS — M7522 Bicipital tendinitis, left shoulder: Secondary | ICD-10-CM | POA: Diagnosis not present

## 2022-02-18 DIAGNOSIS — M25512 Pain in left shoulder: Secondary | ICD-10-CM | POA: Diagnosis not present

## 2022-02-18 DIAGNOSIS — H40053 Ocular hypertension, bilateral: Secondary | ICD-10-CM | POA: Diagnosis not present

## 2022-02-18 DIAGNOSIS — G8918 Other acute postprocedural pain: Secondary | ICD-10-CM | POA: Diagnosis not present

## 2022-02-18 HISTORY — PX: REVERSE SHOULDER ARTHROPLASTY: SHX5054

## 2022-02-18 LAB — GLUCOSE, CAPILLARY
Glucose-Capillary: 110 mg/dL — ABNORMAL HIGH (ref 70–99)
Glucose-Capillary: 143 mg/dL — ABNORMAL HIGH (ref 70–99)
Glucose-Capillary: 138 mg/dL — ABNORMAL HIGH (ref 70–99)
Glucose-Capillary: 145 mg/dL — ABNORMAL HIGH (ref 70–99)
Glucose-Capillary: 200 mg/dL — ABNORMAL HIGH (ref 70–99)

## 2022-02-18 SURGERY — ARTHROPLASTY, SHOULDER, TOTAL, REVERSE
Anesthesia: General | Site: Shoulder | Laterality: Left

## 2022-02-18 MED ORDER — PROPOFOL 10 MG/ML IV BOLUS
INTRAVENOUS | Status: AC
  Filled 2022-02-18: qty 20

## 2022-02-18 MED ORDER — ONDANSETRON HCL 4 MG/2ML IJ SOLN
INTRAMUSCULAR | Status: DC | PRN
  Administered 2022-02-18: 4 mg via INTRAVENOUS

## 2022-02-18 MED ORDER — LIDOCAINE HCL (PF) 2 % IJ SOLN
INTRAMUSCULAR | Status: AC
  Filled 2022-02-18: qty 5

## 2022-02-18 MED ORDER — FENTANYL CITRATE (PF) 100 MCG/2ML IJ SOLN
INTRAMUSCULAR | Status: DC | PRN
Start: 2022-02-18 — End: 2022-02-18
  Administered 2022-02-18: 50 ug via INTRAVENOUS
  Administered 2022-02-18: 12.5 ug via INTRAVENOUS

## 2022-02-18 MED ORDER — SODIUM CHLORIDE 0.9 % IV SOLN: INTRAVENOUS | Status: DC

## 2022-02-18 MED ORDER — ASPIRIN 325 MG PO TBEC
325.0000 mg | DELAYED_RELEASE_TABLET | Freq: Every day | ORAL | Status: DC
  Administered 2022-02-19 – 2022-02-21 (×3): 325 mg via ORAL
  Filled 2022-02-18 (×3): qty 1

## 2022-02-18 MED ORDER — BUPIVACAINE LIPOSOME 1.3 % IJ SUSP
INTRAMUSCULAR | Status: DC | PRN
  Administered 2022-02-18: 10 mL via PERINEURAL

## 2022-02-18 MED ORDER — PHENYLEPHRINE HCL (PRESSORS) 10 MG/ML IV SOLN
INTRAVENOUS | Status: AC
Start: 2022-02-18 — End: ?
  Filled 2022-02-18: qty 1

## 2022-02-18 MED ORDER — PHENYLEPHRINE HCL (PRESSORS) 10 MG/ML IV SOLN
INTRAVENOUS | Status: DC | PRN
Start: 2022-02-18 — End: 2022-02-18
  Administered 2022-02-18: 200 ug via INTRAVENOUS
  Administered 2022-02-18: 100 ug via INTRAVENOUS
  Administered 2022-02-18: 200 ug via INTRAVENOUS

## 2022-02-18 MED ORDER — LIDOCAINE HCL (PF) 1 % IJ SOLN
INTRAMUSCULAR | Status: DC | PRN
  Administered 2022-02-18: 1 mL via SUBCUTANEOUS

## 2022-02-18 MED ORDER — PHENYLEPHRINE HCL-NACL 20-0.9 MG/250ML-% IV SOLN
INTRAVENOUS | Status: DC | PRN
  Administered 2022-02-18: 50 ug/min via INTRAVENOUS

## 2022-02-18 MED ORDER — ONDANSETRON HCL 4 MG/2ML IJ SOLN: 4.0000 mg | Freq: Four times a day (QID) | INTRAMUSCULAR | Status: DC | PRN

## 2022-02-18 MED ORDER — BUPIVACAINE HCL (PF) 0.5 % IJ SOLN
INTRAMUSCULAR | Status: DC | PRN
Start: 2022-02-18 — End: 2022-02-18
  Administered 2022-02-18: 10 mL via PERINEURAL

## 2022-02-18 MED ORDER — INSULIN ASPART 100 UNIT/ML IJ SOLN
0.0000 [IU] | Freq: Three times a day (TID) | INTRAMUSCULAR | Status: DC
  Administered 2022-02-18 – 2022-02-19 (×3): 1 [IU] via SUBCUTANEOUS
  Administered 2022-02-20: 2 [IU] via SUBCUTANEOUS
  Filled 2022-02-18 (×5): qty 1

## 2022-02-18 MED ORDER — CHLORHEXIDINE GLUCONATE 0.12 % MT SOLN
OROMUCOSAL | Status: AC
  Administered 2022-02-18: 15 mL via OROMUCOSAL
  Filled 2022-02-18: qty 15

## 2022-02-18 MED ORDER — ACETAMINOPHEN 500 MG PO TABS
1000.0000 mg | ORAL_TABLET | Freq: Three times a day (TID) | ORAL | Status: DC
  Administered 2022-02-18 – 2022-02-21 (×9): 1000 mg via ORAL
  Filled 2022-02-18 (×9): qty 2

## 2022-02-18 MED ORDER — LACTATED RINGERS IV SOLN: INTRAVENOUS | Status: DC | PRN

## 2022-02-18 MED ORDER — TRANEXAMIC ACID 1000 MG/10ML IV SOLN
INTRAVENOUS | Status: AC
  Filled 2022-02-18: qty 10

## 2022-02-18 MED ORDER — 0.9 % SODIUM CHLORIDE (POUR BTL) OPTIME
TOPICAL | Status: DC | PRN
  Administered 2022-02-18: 1000 mL

## 2022-02-18 MED ORDER — SUCCINYLCHOLINE CHLORIDE 200 MG/10ML IV SOSY
PREFILLED_SYRINGE | INTRAVENOUS | Status: AC
  Filled 2022-02-18: qty 10

## 2022-02-18 MED ORDER — VANCOMYCIN HCL 1000 MG IV SOLR
INTRAVENOUS | Status: AC
  Filled 2022-02-18: qty 20

## 2022-02-18 MED ORDER — SPIRONOLACTONE 25 MG PO TABS
25.0000 mg | ORAL_TABLET | Freq: Every day | ORAL | Status: DC
Start: 2022-02-18 — End: 2022-02-21
  Administered 2022-02-18 – 2022-02-21 (×4): 25 mg via ORAL
  Filled 2022-02-18 (×4): qty 1

## 2022-02-18 MED ORDER — ONDANSETRON HCL 4 MG PO TABS: 4.0000 mg | ORAL_TABLET | Freq: Four times a day (QID) | ORAL | Status: DC | PRN

## 2022-02-18 MED ORDER — DORZOLAMIDE HCL-TIMOLOL MAL 2-0.5 % OP SOLN
1.0000 [drp] | Freq: Two times a day (BID) | OPHTHALMIC | Status: DC
  Administered 2022-02-18 – 2022-02-21 (×6): 1 [drp] via OPHTHALMIC
  Filled 2022-02-18: qty 10

## 2022-02-18 MED ORDER — SEVOFLURANE IN SOLN
RESPIRATORY_TRACT | Status: AC
  Filled 2022-02-18: qty 250

## 2022-02-18 MED ORDER — NEOMYCIN-POLYMYXIN B GU 40-200000 IR SOLN
Status: DC | PRN
  Administered 2022-02-18: 16 mL

## 2022-02-18 MED ORDER — CEFAZOLIN SODIUM-DEXTROSE 2-4 GM/100ML-% IV SOLN
INTRAVENOUS | Status: AC
Start: 2022-02-18 — End: 2022-02-18
  Filled 2022-02-18: qty 100

## 2022-02-18 MED ORDER — SUGAMMADEX SODIUM 200 MG/2ML IV SOLN
INTRAVENOUS | Status: DC | PRN
  Administered 2022-02-18: 200 mg via INTRAVENOUS

## 2022-02-18 MED ORDER — PROMETHAZINE HCL 25 MG/ML IJ SOLN
6.2500 mg | INTRAMUSCULAR | Status: DC | PRN
  Administered 2022-02-18: 6.25 mg via INTRAVENOUS

## 2022-02-18 MED ORDER — BUPIVACAINE HCL (PF) 0.5 % IJ SOLN
INTRAMUSCULAR | Status: AC
  Filled 2022-02-18: qty 10

## 2022-02-18 MED ORDER — LIDOCAINE HCL (PF) 2 % IJ SOLN
INTRAMUSCULAR | Status: DC | PRN
  Administered 2022-02-18: 60 mg

## 2022-02-18 MED ORDER — FENTANYL CITRATE (PF) 100 MCG/2ML IJ SOLN
INTRAMUSCULAR | Status: DC | PRN
  Administered 2022-02-18: 25 ug via INTRAVENOUS

## 2022-02-18 MED ORDER — PANTOPRAZOLE SODIUM 40 MG PO TBEC
80.0000 mg | DELAYED_RELEASE_TABLET | Freq: Every day | ORAL | Status: DC
  Administered 2022-02-19 – 2022-02-20 (×2): 80 mg via ORAL
  Filled 2022-02-18 (×2): qty 2

## 2022-02-18 MED ORDER — ATORVASTATIN CALCIUM 20 MG PO TABS
20.0000 mg | ORAL_TABLET | Freq: Every evening | ORAL | Status: DC
  Administered 2022-02-18 – 2022-02-20 (×3): 20 mg via ORAL
  Filled 2022-02-18 (×3): qty 1

## 2022-02-18 MED ORDER — NEOMYCIN-POLYMYXIN B GU 40-200000 IR SOLN
Status: AC
  Filled 2022-02-18: qty 20

## 2022-02-18 MED ORDER — METOCLOPRAMIDE HCL 5 MG PO TABS: 5.0000 mg | ORAL_TABLET | Freq: Three times a day (TID) | ORAL | Status: DC | PRN

## 2022-02-18 MED ORDER — BUPIVACAINE LIPOSOME 1.3 % IJ SUSP
INTRAMUSCULAR | Status: AC
Start: 2022-02-18 — End: 2022-02-18
  Filled 2022-02-18: qty 10

## 2022-02-18 MED ORDER — ALUM & MAG HYDROXIDE-SIMETH 200-200-20 MG/5ML PO SUSP
30.0000 mL | ORAL | Status: DC | PRN
  Administered 2022-02-19 (×2): 30 mL via ORAL
  Filled 2022-02-18 (×2): qty 30

## 2022-02-18 MED ORDER — STERILE WATER FOR IRRIGATION IR SOLN
Status: DC | PRN
  Administered 2022-02-18: 1000 mL

## 2022-02-18 MED ORDER — OXYCODONE HCL 5 MG PO TABS: 10.0000 mg | ORAL_TABLET | ORAL | Status: DC | PRN

## 2022-02-18 MED ORDER — DOCUSATE SODIUM 100 MG PO CAPS
100.0000 mg | ORAL_CAPSULE | Freq: Two times a day (BID) | ORAL | Status: DC
  Administered 2022-02-18 – 2022-02-21 (×7): 100 mg via ORAL
  Filled 2022-02-18 (×7): qty 1

## 2022-02-18 MED ORDER — PROPOFOL 10 MG/ML IV BOLUS
INTRAVENOUS | Status: DC | PRN
  Administered 2022-02-18: 100 mg via INTRAVENOUS

## 2022-02-18 MED ORDER — METOCLOPRAMIDE HCL 5 MG/ML IJ SOLN
5.0000 mg | Freq: Three times a day (TID) | INTRAMUSCULAR | Status: DC | PRN
  Administered 2022-02-18: 10 mg via INTRAVENOUS
  Filled 2022-02-18: qty 2

## 2022-02-18 MED ORDER — PHENOL 1.4 % MT LIQD: 1.0000 | OROMUCOSAL | Status: DC | PRN

## 2022-02-18 MED ORDER — ROCURONIUM BROMIDE 10 MG/ML (PF) SYRINGE
PREFILLED_SYRINGE | INTRAVENOUS | Status: AC
  Filled 2022-02-18: qty 10

## 2022-02-18 MED ORDER — ROCURONIUM BROMIDE 100 MG/10ML IV SOLN
INTRAVENOUS | Status: DC | PRN
Start: 2022-02-18 — End: 2022-02-18
  Administered 2022-02-18: 20 mg via INTRAVENOUS

## 2022-02-18 MED ORDER — CEFAZOLIN SODIUM-DEXTROSE 2-4 GM/100ML-% IV SOLN
2.0000 g | INTRAVENOUS | Status: AC
  Administered 2022-02-18: 2 g via INTRAVENOUS

## 2022-02-18 MED ORDER — TRANEXAMIC ACID-NACL 1000-0.7 MG/100ML-% IV SOLN
INTRAVENOUS | Status: AC
  Administered 2022-02-18: 1000 mg via INTRAVENOUS
  Filled 2022-02-18: qty 100

## 2022-02-18 MED ORDER — ACETAMINOPHEN 10 MG/ML IV SOLN
INTRAVENOUS | Status: AC
  Filled 2022-02-18: qty 100

## 2022-02-18 MED ORDER — MENTHOL 3 MG MT LOZG: 1.0000 | LOZENGE | OROMUCOSAL | Status: DC | PRN

## 2022-02-18 MED ORDER — SENNOSIDES-DOCUSATE SODIUM 8.6-50 MG PO TABS: 1.0000 | ORAL_TABLET | Freq: Every evening | ORAL | Status: DC | PRN

## 2022-02-18 MED ORDER — PROMETHAZINE HCL 25 MG/ML IJ SOLN
INTRAMUSCULAR | Status: AC
Start: 2022-02-18 — End: 2022-02-18
  Filled 2022-02-18: qty 1

## 2022-02-18 MED ORDER — CEFAZOLIN SODIUM-DEXTROSE 2-4 GM/100ML-% IV SOLN
2.0000 g | Freq: Four times a day (QID) | INTRAVENOUS | Status: AC
  Administered 2022-02-18 – 2022-02-19 (×3): 2 g via INTRAVENOUS
  Filled 2022-02-18 (×4): qty 100

## 2022-02-18 MED ORDER — DEXAMETHASONE SODIUM PHOSPHATE 10 MG/ML IJ SOLN
INTRAMUSCULAR | Status: AC
  Filled 2022-02-18: qty 1

## 2022-02-18 MED ORDER — LATANOPROST 0.005 % OP SOLN
1.0000 [drp] | Freq: Every day | OPHTHALMIC | Status: DC
  Administered 2022-02-18 – 2022-02-20 (×3): 1 [drp] via OPHTHALMIC
  Filled 2022-02-18: qty 2.5

## 2022-02-18 MED ORDER — DEXAMETHASONE SODIUM PHOSPHATE 10 MG/ML IJ SOLN
INTRAMUSCULAR | Status: DC | PRN
  Administered 2022-02-18: 10 mg via INTRAVENOUS

## 2022-02-18 MED ORDER — BISACODYL 10 MG RE SUPP: 10.0000 mg | Freq: Every day | RECTAL | Status: DC | PRN

## 2022-02-18 MED ORDER — ORAL CARE MOUTH RINSE: 15.0000 mL | Freq: Once | OROMUCOSAL | Status: AC

## 2022-02-18 MED ORDER — LIDOCAINE HCL (CARDIAC) PF 100 MG/5ML IV SOSY
PREFILLED_SYRINGE | INTRAVENOUS | Status: DC | PRN
  Administered 2022-02-18: 60 mg via INTRAVENOUS

## 2022-02-18 MED ORDER — ONDANSETRON HCL 4 MG/2ML IJ SOLN
INTRAMUSCULAR | Status: AC
  Filled 2022-02-18: qty 2

## 2022-02-18 MED ORDER — SUCCINYLCHOLINE CHLORIDE 200 MG/10ML IV SOSY
PREFILLED_SYRINGE | INTRAVENOUS | Status: DC | PRN
Start: 2022-02-18 — End: 2022-02-18
  Administered 2022-02-18: 80 mg via INTRAVENOUS

## 2022-02-18 MED ORDER — EPHEDRINE 5 MG/ML INJ
INTRAVENOUS | Status: AC
  Filled 2022-02-18: qty 5

## 2022-02-18 MED ORDER — LIDOCAINE HCL (PF) 1 % IJ SOLN
INTRAMUSCULAR | Status: AC
Start: 2022-02-18 — End: 2022-02-18
  Filled 2022-02-18: qty 2

## 2022-02-18 MED ORDER — ACETAMINOPHEN 10 MG/ML IV SOLN
INTRAVENOUS | Status: DC | PRN
Start: 2022-02-18 — End: 2022-02-18
  Administered 2022-02-18: 1000 mg via INTRAVENOUS

## 2022-02-18 MED ORDER — ADULT MULTIVITAMIN W/MINERALS CH
1.0000 | ORAL_TABLET | Freq: Every day | ORAL | Status: DC
  Administered 2022-02-18 – 2022-02-21 (×4): 1 via ORAL
  Filled 2022-02-18 (×4): qty 1

## 2022-02-18 MED ORDER — ENALAPRIL MALEATE 20 MG PO TABS
20.0000 mg | ORAL_TABLET | Freq: Every day | ORAL | Status: DC
  Administered 2022-02-19 – 2022-02-21 (×3): 20 mg via ORAL
  Filled 2022-02-18 (×4): qty 1

## 2022-02-18 MED ORDER — TRANEXAMIC ACID-NACL 1000-0.7 MG/100ML-% IV SOLN: 1000.0000 mg | Freq: Once | INTRAVENOUS | Status: AC

## 2022-02-18 MED ORDER — FENTANYL CITRATE (PF) 100 MCG/2ML IJ SOLN
INTRAMUSCULAR | Status: AC
Start: 2022-02-18 — End: ?
  Filled 2022-02-18: qty 2

## 2022-02-18 MED ORDER — HYDROMORPHONE HCL 1 MG/ML IJ SOLN: 0.2000 mg | INTRAMUSCULAR | Status: DC | PRN

## 2022-02-18 MED ORDER — VANCOMYCIN HCL 1000 MG IV SOLR
INTRAVENOUS | Status: DC | PRN
  Administered 2022-02-18: 1000 mg via TOPICAL

## 2022-02-18 MED ORDER — MENTHOL (TOPICAL ANALGESIC) 10 % EX LIQD: 1.0000 "application " | Freq: Three times a day (TID) | CUTANEOUS | Status: DC | PRN

## 2022-02-18 MED ORDER — EPHEDRINE SULFATE (PRESSORS) 50 MG/ML IJ SOLN
INTRAMUSCULAR | Status: DC | PRN
Start: 2022-02-18 — End: 2022-02-18
  Administered 2022-02-18: 10 mg via INTRAVENOUS
  Administered 2022-02-18: 5 mg via INTRAVENOUS

## 2022-02-18 MED ORDER — FENTANYL CITRATE PF 50 MCG/ML IJ SOSY
PREFILLED_SYRINGE | INTRAMUSCULAR | Status: AC
  Filled 2022-02-18: qty 1

## 2022-02-18 MED ORDER — OXYCODONE HCL 5 MG PO TABS
5.0000 mg | ORAL_TABLET | ORAL | Status: DC | PRN
  Administered 2022-02-20 – 2022-02-21 (×2): 5 mg via ORAL
  Filled 2022-02-18 (×2): qty 1

## 2022-02-18 MED ORDER — SODIUM CHLORIDE 0.9 % IR SOLN
Status: DC | PRN
  Administered 2022-02-18: 3000 mL

## 2022-02-18 MED ORDER — TRANEXAMIC ACID-NACL 1000-0.7 MG/100ML-% IV SOLN
INTRAVENOUS | Status: DC | PRN
  Administered 2022-02-18: 1000 mg via INTRAVENOUS

## 2022-02-18 MED ORDER — CHLORHEXIDINE GLUCONATE 0.12 % MT SOLN: 15.0000 mL | Freq: Once | OROMUCOSAL | Status: AC

## 2022-02-18 SURGICAL SUPPLY — 85 items
AUGMENT BASEPLATE 25M 35D WEDG (Joint) IMPLANT
BASEPLATE AUGMNT 25M 35D WEDGE (Joint) ×2 IMPLANT
BIT DRILL 3.2 PERIPHERAL SCREW (BIT) ×1 IMPLANT
BLADE SAGITTAL WIDE XTHICK NO (BLADE) ×2 IMPLANT
CABLE 1.7 (Orthopedic Implant) ×2 IMPLANT
CHLORAPREP W/TINT 26 (MISCELLANEOUS) ×2 IMPLANT
CNTNR SPEC 2.5X3XGRAD LEK (MISCELLANEOUS)
CONT SPEC 4OZ STER OR WHT (MISCELLANEOUS)
CONTAINER SPEC 2.5X3XGRAD LEK (MISCELLANEOUS) ×1 IMPLANT
COOLER POLAR GLACIER W/PUMP (MISCELLANEOUS) ×2 IMPLANT
COVER BACK TABLE REUSABLE LG (DRAPES) ×2 IMPLANT
DERMABOND ADVANCED (GAUZE/BANDAGES/DRESSINGS) ×1
DERMABOND ADVANCED .7 DNX12 (GAUZE/BANDAGES/DRESSINGS) ×1 IMPLANT
DRAPE 3/4 80X56 (DRAPES) ×4 IMPLANT
DRAPE INCISE IOBAN 66X45 STRL (DRAPES) ×4 IMPLANT
DRAPE U-SHAPE 47X51 STRL (DRAPES) ×2 IMPLANT
DRSG MEPILEX SACRM 8.7X9.8 (GAUZE/BANDAGES/DRESSINGS) ×1 IMPLANT
DRSG OPSITE POSTOP 3X4 (GAUZE/BANDAGES/DRESSINGS) ×2 IMPLANT
DRSG OPSITE POSTOP 4X6 (GAUZE/BANDAGES/DRESSINGS) ×2 IMPLANT
DRSG OPSITE POSTOP 4X8 (GAUZE/BANDAGES/DRESSINGS) ×2 IMPLANT
DRSG TEGADERM 4X10 (GAUZE/BANDAGES/DRESSINGS) ×6 IMPLANT
ELECT REM PT RETURN 9FT ADLT (ELECTROSURGICAL) ×2
ELECTRODE REM PT RTRN 9FT ADLT (ELECTROSURGICAL) ×1 IMPLANT
GAUZE XEROFORM 1X8 LF (GAUZE/BANDAGES/DRESSINGS) ×2 IMPLANT
GLENOSPHERE REV SHOULDER 36 (Joint) ×1 IMPLANT
GLOVE BIOGEL PI IND STRL 8 (GLOVE) ×2 IMPLANT
GLOVE BIOGEL PI INDICATOR 8 (GLOVE) ×2
GLOVE PI ULTRA LF STRL 7.5 (GLOVE) ×2 IMPLANT
GLOVE PI ULTRA NON LATEX 7.5 (GLOVE) ×2
GLOVE SURG ORTHO 8.0 STRL STRW (GLOVE) ×4 IMPLANT
GLOVE SURG SYN 8.0 (GLOVE) ×2 IMPLANT
GLOVE SURG SYN 8.0 PF PI (GLOVE) ×1 IMPLANT
GOWN STRL REUS W/ TWL LRG LVL3 (GOWN DISPOSABLE) ×2 IMPLANT
GOWN STRL REUS W/ TWL XL LVL3 (GOWN DISPOSABLE) ×1 IMPLANT
GOWN STRL REUS W/TWL LRG LVL3 (GOWN DISPOSABLE) ×2
GOWN STRL REUS W/TWL XL LVL3 (GOWN DISPOSABLE) ×1
GUIDE GLENOID PATIENT REVERSED (MISCELLANEOUS) ×1 IMPLANT
GUIDEWIRE GLENOID 2.5X220 (WIRE) ×1 IMPLANT
HEMOVAC 400CC 10FR (MISCELLANEOUS) ×2 IMPLANT
HOOD PEEL AWAY FLYTE STAYCOOL (MISCELLANEOUS) ×6 IMPLANT
IMPL REVERSE SHOULDER 0X3.5 (Shoulder) IMPLANT
IMPLANT REVERSE SHOULDER 0X3.5 (Shoulder) ×2 IMPLANT
INSERT HUMERAL 36X6MM 12.5DEG (Insert) ×2 IMPLANT
KIT STABILIZATION SHOULDER (MISCELLANEOUS) ×2 IMPLANT
MANIFOLD NEPTUNE II (INSTRUMENTS) ×2 IMPLANT
MASK FACE SPIDER DISP (MASK) ×2 IMPLANT
MAT ABSORB  FLUID 56X50 GRAY (MISCELLANEOUS) ×1
MAT ABSORB FLUID 56X50 GRAY (MISCELLANEOUS) ×2 IMPLANT
NDL MAYO 6 CRC TAPER PT (NEEDLE) IMPLANT
NDL REVERSE CUT 1/2 CRC (NEEDLE) ×1 IMPLANT
NDL SPNL 20GX3.5 QUINCKE YW (NEEDLE) ×1 IMPLANT
NEEDLE MAYO 6 CRC TAPER PT (NEEDLE) ×2 IMPLANT
NEEDLE REVERSE CUT 1/2 CRC (NEEDLE) ×2 IMPLANT
NEEDLE SPNL 20GX3.5 QUINCKE YW (NEEDLE) ×2 IMPLANT
NS IRRIG 1000ML POUR BTL (IV SOLUTION) ×2 IMPLANT
PACK ARTHROSCOPY SHOULDER (MISCELLANEOUS) ×2 IMPLANT
PAD WRAPON POLAR SHDR XLG (MISCELLANEOUS) ×1 IMPLANT
PULSAVAC PLUS IRRIG FAN TIP (DISPOSABLE) ×2
SCREW 5.5X14 (Screw) ×1 IMPLANT
SCREW 5.5X26 (Screw) ×1 IMPLANT
SCREW BONE THREAD 6.5X35 (Screw) ×1 IMPLANT
SCREW PERIPHERAL 5.0X34 (Screw) ×1 IMPLANT
SLEEVE SCD COMPRESS KNEE MED (STOCKING) ×1 IMPLANT
SLING ULTRA II LG (MISCELLANEOUS) ×1 IMPLANT
SLING ULTRA II M (MISCELLANEOUS) ×1 IMPLANT
SPONGE T-LAP 18X18 ~~LOC~~+RFID (SPONGE) ×9 IMPLANT
STEM HUM AEQUALIS STD SZ1B 66 (Stem) ×1 IMPLANT
STRAP SAFETY 5IN WIDE (MISCELLANEOUS) ×4 IMPLANT
SUT ETHIBOND 5-0 MS/4 CCS GRN (SUTURE) ×2
SUT FIBERWIRE #2 38 BLUE 1/2 (SUTURE) ×6
SUT MNCRL AB 4-0 PS2 18 (SUTURE) IMPLANT
SUT PROLENE 6 0 P 1 18 (SUTURE) ×2 IMPLANT
SUT TICRON 2-0 30IN 311381 (SUTURE) ×9 IMPLANT
SUT TIGER TAPE 7 IN WHITE (SUTURE) ×1 IMPLANT
SUT VIC AB 0 CT1 36 (SUTURE) ×2 IMPLANT
SUT VIC AB 2-0 CT2 27 (SUTURE) ×4 IMPLANT
SUTURE ETHBND 5-0 MS/4 CCS GRN (SUTURE) IMPLANT
SUTURE FIBERWR #2 38 BLUE 1/2 (SUTURE) ×4 IMPLANT
SYR 20ML LL LF (SYRINGE) ×2 IMPLANT
SYR 30ML LL (SYRINGE) ×2 IMPLANT
TIP FAN IRRIG PULSAVAC PLUS (DISPOSABLE) ×1 IMPLANT
TRAY FOLEY SLVR 16FR LF STAT (SET/KITS/TRAYS/PACK) ×1 IMPLANT
WATER STERILE IRR 1000ML POUR (IV SOLUTION) ×1 IMPLANT
WATER STERILE IRR 500ML POUR (IV SOLUTION) ×1 IMPLANT
WRAPON POLAR PAD SHDR XLG (MISCELLANEOUS) ×2

## 2022-02-18 NOTE — Anesthesia Procedure Notes (Signed)
Procedure Name: Intubation Date/Time: 02/18/2022 7:40 AM Performed by: Natasha Mead, CRNA Pre-anesthesia Checklist: Patient identified, Emergency Drugs available, Suction available and Patient being monitored Patient Re-evaluated:Patient Re-evaluated prior to induction Oxygen Delivery Method: Circle system utilized Preoxygenation: Pre-oxygenation with 100% oxygen Induction Type: IV induction Ventilation: Mask ventilation without difficulty Laryngoscope Size: Miller and 2 Grade View: Grade I Tube type: Oral Tube size: 6.5 mm Number of attempts: 1 Airway Equipment and Method: Stylet and Oral airway Placement Confirmation: ETT inserted through vocal cords under direct vision, positive ETCO2 and breath sounds checked- equal and bilateral Secured at: 20 cm Tube secured with: Tape Dental Injury: Teeth and Oropharynx as per pre-operative assessment

## 2022-02-18 NOTE — Plan of Care (Signed)
  Problem: Education: Goal: Knowledge of General Education information will improve Description: Including pain rating scale, medication(s)/side effects and non-pharmacologic comfort measures Outcome: Progressing   Problem: Health Behavior/Discharge Planning: Goal: Ability to manage health-related needs will improve Outcome: Progressing   Problem: Clinical Measurements: Goal: Ability to maintain clinical measurements within normal limits will improve Outcome: Progressing   Problem: Clinical Measurements: Goal: Will remain free from infection Outcome: Progressing   Problem: Clinical Measurements: Goal: Diagnostic test results will improve Outcome: Progressing   Problem: Clinical Measurements: Goal: Respiratory complications will improve Outcome: Progressing   Problem: Nutrition: Goal: Adequate nutrition will be maintained Outcome: Progressing   Problem: Coping: Goal: Level of anxiety will decrease Outcome: Progressing   Problem: Elimination: Goal: Will not experience complications related to urinary retention Outcome: Progressing   Problem: Pain Managment: Goal: General experience of comfort will improve Outcome: Progressing   Problem: Safety: Goal: Ability to remain free from injury will improve Outcome: Progressing   Problem: Skin Integrity: Goal: Risk for impaired skin integrity will decrease Outcome: Progressing   Problem: Education: Goal: Knowledge of the prescribed therapeutic regimen will improve Outcome: Progressing   Problem: Education: Goal: Individualized Educational Video(s) Outcome: Progressing   Problem: Pain Management: Goal: Pain level will decrease with appropriate interventions Outcome: Progressing

## 2022-02-18 NOTE — Anesthesia Postprocedure Evaluation (Signed)
Anesthesia Post Note  Patient: Lori Harrell  Procedure(s) Performed: Left reverse shoulder arthroplasty, biceps tenodesis (Left: Shoulder)  Patient location during evaluation: PACU Anesthesia Type: General Level of consciousness: awake and alert Pain management: pain level controlled Vital Signs Assessment: post-procedure vital signs reviewed and stable Respiratory status: spontaneous breathing, nonlabored ventilation and respiratory function stable Cardiovascular status: blood pressure returned to baseline and stable Postop Assessment: no apparent nausea or vomiting Anesthetic complications: no   No notable events documented.   Last Vitals:  Vitals:   02/18/22 1215 02/18/22 1238  BP: 106/76 107/77  Pulse: 67 67  Resp: 18 16  Temp: (!) 36.3 C (!) 36.4 C  SpO2: 100% 98%    Last Pain:  Vitals:   02/18/22 1238  TempSrc: Oral  PainSc:                  [place] Ouch

## 2022-02-18 NOTE — Anesthesia Preprocedure Evaluation (Addendum)
Anesthesia Evaluation  Patient identified by MRN, date of birth, ID band Patient awake    Reviewed: Allergy & Precautions, H&P , NPO status , Patient's Chart, lab work & pertinent test results  History of Anesthesia Complications Negative for: history of anesthetic complications  Airway Mallampati: III  TM Distance: >3 FB Neck ROM: limited    Dental no notable dental hx.    Pulmonary neg pulmonary ROS, neg shortness of breath,    Pulmonary exam normal        Cardiovascular Exercise Tolerance: Good hypertension, Pt. on medications (-) angina(-) Past MI and (-) DOE Normal cardiovascular exam Rhythm:Regular     Neuro/Psych negative neurological ROS  negative psych ROS   GI/Hepatic Neg liver ROS, hiatal hernia, GERD  Medicated and Controlled,  Endo/Other  diabetes, Type 2  Renal/GU      Musculoskeletal   Abdominal Normal abdominal exam  (+)   Peds  Hematology negative hematology ROS (+)   Anesthesia Other Findings Past Medical History: No date: Acid reflux 02/01/2019: Acute cholangitis 07/07/2017: AVN (avascular necrosis of bone) ([place]) No date: Choledocholithiasis No date: Diabetes mellitus without complication ([place]) No date: Elevated lipids No date: Hypertension  Past Surgical History: No date: CATARACT EXTRACTION 02/05/2019: CHOLECYSTECTOMY, LAPAROSCOPIC     Comment:  [facility] 2007: COLONOSCOPY     Comment:  sigmoid diverticulosis 02/02/2019: ENDOSCOPIC RETROGRADE CHOLANGIOPANCREATOGRAPHY (ERCP) WITH  PROPOFOL; N/A     Comment:  Procedure: ENDOSCOPIC RETROGRADE               CHOLANGIOPANCREATOGRAPHY (ERCP) WITH PROPOFOL;  Surgeon:               Lucilla Lame, MD;  Location: [facility] ENDOSCOPY;  Service:               Endoscopy;  Laterality: N/A; No date: TONSILLECTOMY 02/2013: UPPER GASTROINTESTINAL ENDOSCOPY     Comment:  Barrett's - done at [facility] No date: VAGINAL HYSTERECTOMY  BMI    Body Mass Index: 21.92  kg/m      Reproductive/Obstetrics negative OB ROS                            Anesthesia Physical  Anesthesia Plan  ASA: III  Anesthesia Plan: General ETT   Post-op Pain Management: Regional block* and Minimal or no pain anticipated   Induction: Intravenous  PONV Risk Score and Plan: Ondansetron, Dexamethasone and Midazolam  Airway Management Planned: Oral ETT  Additional Equipment:   Intra-op Plan:   Post-operative Plan: Extubation in OR  Informed Consent: I have reviewed the patients History and Physical, chart, labs and discussed the procedure including the risks, benefits and alternatives for the proposed anesthesia with the patient or authorized representative who has indicated his/her understanding and acceptance.     Dental Advisory Given  Plan Discussed with: Anesthesiologist, CRNA and Surgeon  Anesthesia Plan Comments: (Patient consented for risks of anesthesia including but not limited to:  - adverse reactions to medications - damage to teeth, lips or other oral mucosa - sore throat or hoarseness - Damage to heart, brain, lungs or loss of life  Patient voiced understanding.)       Anesthesia Quick Evaluation

## 2022-02-18 NOTE — Op Note (Signed)
Operative Note    SURGERY DATE: 02/18/2022   PRE-OP DIAGNOSIS:  1.  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  Left humeral head avascular necrosis   POST-OP DIAGNOSIS:  1.  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  Left humeral head avascular necrosis  PROCEDURES:  1. Left reverse total shoulder arthroplasty 2. Left biceps tenodesis   SURGEON: Cato Mulligan, MD   ASSISTANT: Anitra Lauth, PA  ANESTHESIA: Gen + interscalene block   ESTIMATED BLOOD LOSS: 150cc   TOTAL IV FLUIDS: See anesthesia record  IMPLANTS: Tornier: Perform plus reversed  87m half wedge augment baseplate 35 degree with 337mcentral screw; superior compression screw, anterior locking screw, and inferior locking screw to glenoid baseplate; 36 mm, standard glenosphere; size 1B standard humeral stem; High (3.22m63meccentric reversed tray; standard reversed insert +6 mm; 1.7mm622mrclage cables x 2   INDICATION(S):  Lori Harrell 80 y18. female with chronic shoulder pain and stiffness.  Sustained a prior proximal humerus fracture approximately 10 years ago.  This was treated nonoperatively.  She had progressive degenerative changes and was found to have avascular necrosis.  She has failed nonoperative management consisting to modifications, medical management and corticosteroid injection. After discussion of risks, benefits, and alternatives to surgery, the patient elected to proceed with reverse shoulder arthroplasty and biceps tenodesis.   OPERATIVE FINDINGS: Proximal humerus malunion and avascular necrosis of the humeral head; biceps tendinopathy   OPERATIVE REPORT:   I identified Lori Harrell in the pre-operative holding area. Informed consent was obtained and the surgical site was marked. I reviewed the risks and benefits of the proposed surgical intervention and the patient (and/or patient's guardian) wished to proceed. An interscalene block with Exparel was  administered by the Anesthesia team. The patient was transferred to the operative suite and general anesthesia was administered. The patient was placed in the beach chair position with the head of the bed elevated approximately 45 degrees. All down side pressure points were appropriately padded. Pre-op exam under anesthesia confirmed significant stiffness and crepitus. Appropriate IV antibiotics were administered. Tranexamic acid was also administered after verifying that the patient had no contraindications. The extremity was then prepped and draped in standard fashion. A time out was performed confirming the correct extremity, correct patient, and correct procedure.   We used the standard deltopectoral incision from the coracoid to ~12cm distal. We found the cephalic vein and took it laterally.  We opened the deltopectoral interval widely and placed retractors under the CA ligament in the subacromial space and under the deltoid tendon at its insertion. We then abducted and internally rotated the arm and released the underlying bursa between these retractors, taking care not to damage the circumflex branch of the axillary nerve.   Next, we brought the arm back in adduction at slight forward flexion with external rotation. We opened the clavipectoral fascia lateral to the conjoint tendon. We gently palpated the axillary nerve and verified its position and continuity on both sides of the humerus with a Tug test. Note, this test was repeated multiple times during the procedure for nerve localization and confirmed to be intact at the end of the case. We then cauterized the anterior humeral circumflex ("Three sisters") vessels. The arm was then internally rotated, we cut the falciform ligament at approximately 1 cm of the upper portion of the pectoralis major insertion. Next we unroofed the bicipital groove. We proceeded with a soft tissue biceps tenodesis given the pathology  of the tendon.  After opening the biceps  tendon sheath all the way to the supraglenoid tubercle, we performed a biceps tenodesis with two #2 TiCron sutures to the upper border of the pectoralis major. The proximal portion of the tendon was excised.   Next, given the deformity of the humeral head, a lesser tuberosity osteotomy was performed.  Traction stitches were placed at the bone tendon junction.  We released the inferior capsule from the humerus all the way to the posterior band of the inferior glenohumeral ligament. When this was complete we gently dislocated the shoulder up into the wound.  The rotator cuff was noted to be intact and attached to the greater tuberosity, but given the malunion, this was significantly superior compared to its anatomic position.  Additionally appropriate humeral head cannot be made violating rotator cuff.  Therefore, osteotomy of the greater tuberosity was also performed using an osteotome.  #5 Ethibond sutures were placed around the greater tuberosity at the bone-tendon junction.  Additionally, X-Braid tape and FiberTape sutures were placed around the greater tuberosity (to serve as tuberosity to implant suture and as an around the world suture).  Next, the humeral head cut was performed. The humeral canal was sequentially broached in 20 degrees of retroversion to the appropriate size.  Upon broaching, there was noted to be a small fracture of the medial calcar.  2 cerclage cables were placed around this region to anatomically reduce and stabilize fracture.    We then turned our attention to the glenoid. The proximal humerus was retracted posteriorly. The anterior capsule was dissected free from the subscapularis. The anterior capsule was then excised, exposing the anterior glenoid. We then grasped the labrum and removed it circumferentially. During the glenoid exposure, the axillary nerve was protected the entire time.     A patient-specific guide was used to drill the central guidepin.  The high side was  reamed until reaming marks reach the central guidepin.  The angled reamer was used to gently ream the augment side superiorly according to the preoperative plan. The half wedge glenoid trial was placed and locked into place with the augment superior.  Standard instrumentation was carried out.  The baseplate with 6.5 mm central screw was placed.  This allowed for excellent compression of the baseplate to the glenoid with appropriate seating. Next the superior compression screw was drilled and placed.  The remainder of the locking screws were also placed. The peripheral reamer was used to ensure appropriate glenosphere seating.  Next, the glenosphere was impacted into place, and central screw of the glenosphere was tightened.  We then turned our attention back to the humerus.  The trial poly was placed and the humerus was reduced.  The shoulder was trialed and noted to have satisfactory stability, motion, and deltoid tension with a standard +6 mm poly.  The trial implants were removed. The humeral canal was pulse lavaged. We drilled two holes into the shaft on either side of the bicipital groove ~2cm inferior to the humeral fracture line and passed two Xbraid tapes through both holes to serve as vertical tuberosity fixation sutures. The stem was then inserted into the appropriate position.  Excellent press-fit was achieved.  Reverse tray and poly insert were placed. The previously passed Xbraid and Fibertape sutures in the greater tuberosity were passed around the tray. The joint was reduced.   Next, #5 Ethibond sutures from the greater tuberosity were passed around the lesser tuberosity to serve as horizontal cerclage sutures.  FiberTape  was then passed around the lesser tuberosity as well to serve as an around the world suture (greater tuberosity to implant to the lesser tuberosity).  Both vertical sutures were passed through the greater and lesser tuberosities.  Bone graft was placed between the tuberosities  and the implant to help stimulate bone healing.  The greater tuberosity to implant XBraid sutures was tied first, securing the greater tuberosity to the implant.  Next, the horizontal cerclage Ethibond sutures were tied, and this was followed by the around the world XBraid suture. Finally, vertical sutures were tied. This achieved excellent stability of the tuberosities and they were stable to external and internal rotation.   A Hemovac drain was placed. We again verified the tension on the axillary nerve, appropriate range of motion, stability of the implant, and security of the tuberosity repair. We closed the deltopectoral interval a running, 0-Vicryl suture. The skin was closed with 2-0 Vicryl, 4-0 Monocryl and Dermabond. Xeroform and Honeycomb dressing was applied. A PolarCare unit and sling were placed. Patient was extubated, transferred to a stretcher bed and to the post antesthesia care unit in stable condition.   Of note, assistance from a PA was essential to performing the surgery.  PA was present for the entire surgery.  PA assisted with patient positioning, retraction, instrumentation, and wound closure. The surgery would have been more difficult and had longer operative time without PA assistance.   Additionally, this case had significantly increased complexity and surgical time compared to standard reverse shoulder arthroplasty.  This was due to this being a proximal humerus malunion with avascular necrosis of head this caused significantly distorted anatomy which required more careful and meticulous dissection, adding to surgical time.  Additionally, lesser and greater tuberosity repair required multiple sutures to be placed and tied.  Furthermore, bony deformity necessitated the use of a wedge implant leading to increased glenoid instrumentation, also adding to surgical time. Combined, these factors added approximately 30 minutes to the surgical time compared to that for a standard reverse  shoulder arthroplasty.  POSTOPERATIVE PLAN: The patient will be admitted with plan for discharge home vs. SNF pending PT evaluation. Operative arm to remain in sling at all times except RoM exercises and hygiene. Can perform pendulums, elbow/wrist/hand RoM exercises. Passive RoM allowed to 90 FF and 30 ER. ASA '325mg'$  x 6 weeks for DVT ppx. Patient to return to clinic in ~2 weeks for post-operative appointment.

## 2022-02-18 NOTE — Evaluation (Signed)
Physical Therapy Evaluation Patient Details Name: Lori Harrell MRN: [id number] DOB: 1941-07-17 Today's Date: 02/18/2022  History of Present Illness  Pt is an 81 y.o. 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  PMH includes htn, hiatal hernia, DM, AVN, spine sx.  Clinical Impression  Prior to surgery, pt was modified independent ambulating with SPC; lives alone in 1 level apt with level entry; h/o falls.  Currently pt is min assist semi-supine to sitting edge of bed; min assist with transfers; and min assist to ambulate a few feet bed to recliner (limited distance ambulating d/t pt not feeling well; BP 110/76 with HR 66 pm and O2 sats 100% on room air).  Pt reports she does not have anyone that can stay with her to assist upon hospital discharge.  Pt would benefit from skilled PT to address noted impairments and functional limitations (see below for any additional details).  Upon hospital discharge, pt would benefit from SNF.    Recommendations for follow up therapy are one component of a multi-disciplinary discharge planning process, led by the attending physician.  Recommendations may be updated based on patient status, additional functional criteria and insurance authorization.  Follow Up Recommendations Skilled nursing-short term rehab (<3 hours/day)    Assistance Recommended at Discharge Frequent or constant Supervision/Assistance  Patient can return home with the following  A little help with walking and/or transfers;A little help with bathing/dressing/bathroom;Assistance with cooking/housework;Assist for transportation;Help with stairs or ramp for entrance    Equipment Recommendations None recommended by PT (pt has SPC already)  Recommendations for Other Services  OT consult    Functional Status Assessment Patient has had  a recent decline in their functional status and demonstrates the ability to make significant improvements in function in a reasonable and predictable amount of time.     Precautions / Restrictions Precautions Precautions: Fall Precaution Comments: L UE in sling at all times; may be off for dressing/bathing/exercise Required Braces or Orthoses: Sling Restrictions Weight Bearing Restrictions: Yes LUE Weight Bearing: Non weight bearing Other Position/Activity Restrictions: Per operative report "Operative arm to remain in sling at all times except RoM exercises and hygiene. Can perform pendulums, elbow/wrist/hand RoM exercises. Passive RoM allowed to 90 FF and 30 ER."      Mobility  Bed Mobility Overal bed mobility: Needs Assistance Bed Mobility: Supine to Sit     Supine to sit: Min assist, HOB elevated     General bed mobility comments: assist for trunk; increased effort/time to perform    Transfers Overall transfer level: Needs assistance Equipment used: 1 person hand held assist Transfers: Sit to/from Stand Sit to Stand: Min assist           General transfer comment: assist to steady with transfers; vc's for UE placement    Ambulation/Gait Ambulation/Gait assistance: Min assist Gait Distance (Feet): 3 Feet (bed to recliner) Assistive device: 1 person hand held assist   Gait velocity: decreased     General Gait Details: pt unsteady requiring min assist for balance; decreased B LE step length/foot clearance  Stairs            Wheelchair Mobility    Modified Rankin (Stroke Patients Only)       Balance Overall balance assessment: Needs assistance Sitting-balance support: No upper extremity supported, Feet supported Sitting balance-Leahy Scale: Good Sitting balance - Comments: steady sitting  reaching within BOS with R UE   Standing balance support: Single extremity supported Standing balance-Leahy Scale: Fair Standing balance comment: pt requiring  single UE support for static standing balance                             Pertinent Vitals/Pain Pain Assessment Pain Assessment: No/denies pain    Home Living Family/patient expects to be discharged to:: Private residence Living Arrangements: Alone   Type of Home: Apartment Home Access: Level entry (1st floor)       Home Layout: One level Home Equipment: Cane - single point;Toilet riser;Shower seat (suction cup grab bar in shower)      Prior Function Prior Level of Function : Independent/Modified Independent             Mobility Comments: Modified independent ambulating with SPC.  Pt with fall January 18th 2023 dislocating R shoulder and just finished OT yesterday for therapy for it.       Hand Dominance        Extremity/Trunk Assessment   Upper Extremity Assessment Upper Extremity Assessment: LUE deficits/detail (R UE WFL) LUE: Unable to fully assess due to immobilization    Lower Extremity Assessment Lower Extremity Assessment: Generalized weakness    Cervical / Trunk Assessment Cervical / Trunk Assessment: Normal  Communication   Communication: No difficulties  Cognition Arousal/Alertness: Awake/alert Behavior During Therapy: WFL for tasks assessed/performed Overall Cognitive Status: Within Functional Limits for tasks assessed                                          General Comments General comments (skin integrity, edema, etc.): L UE in shoulder sling with abduction pillow.  Nursing cleared pt for participation in physical therapy.  Pt agreeable to PT session.    Exercises     Assessment/Plan    PT Assessment Patient needs continued PT services  PT Problem List Decreased strength;Decreased range of motion;Decreased activity tolerance;Decreased balance;Decreased mobility;Decreased knowledge of use of DME;Decreased knowledge of precautions;Impaired sensation;Pain;Decreased skin integrity       PT Treatment  Interventions DME instruction;Gait training;Functional mobility training;Therapeutic activities;Therapeutic exercise;Balance training;Patient/family education    PT Goals (Current goals can be found in the Care Plan section)  Acute Rehab PT Goals Patient Stated Goal: to improve function PT Goal Formulation: With patient Time For Goal Achievement: 03/04/22 Potential to Achieve Goals: Good    Frequency BID     Co-evaluation               AM-PAC PT "6 Clicks" Mobility  Outcome Measure Help needed turning from your back to your side while in a flat bed without using bedrails?: None Help needed moving from lying on your back to sitting on the side of a flat bed without using bedrails?: A Little Help needed moving to and from a bed to a chair (including a wheelchair)?: A Little Help needed standing up from a chair using your arms (e.g., wheelchair or bedside chair)?: A Little Help needed to walk in hospital room?: A Little Help needed climbing 3-5 steps with a railing? : A Lot 6 Click Score: 18    End of Session Equipment Utilized During Treatment: Gait belt;Other (comment) (L UE sling/immobilizer) Activity Tolerance: Patient tolerated treatment well Patient left: in chair;with call bell/phone within reach;with chair alarm set;with SCD's reapplied;Other (comment) (B  heels floating via pillow support; polar care in place) Nurse Communication: Mobility status;Precautions;Weight bearing status PT Visit Diagnosis: Unsteadiness on feet (R26.81);Muscle weakness (generalized) (M62.81);History of falling (Z91.81);Pain Pain - Right/Left: Left Pain - part of body: Shoulder    Time: 3912-2583 PT Time Calculation (min) (ACUTE ONLY): 30 min   Charges:   PT Evaluation $PT Eval Low Complexity: 1 Low PT Treatments $Therapeutic Activity: 8-22 mins       Leitha Bleak, PT 02/18/22, 3:47 PM

## 2022-02-18 NOTE — Transfer of Care (Signed)
Immediate Anesthesia Transfer of Care Note  Patient: Lori Harrell  Procedure(s) Performed: Left reverse shoulder arthroplasty, biceps tenodesis (Left: Shoulder)  Patient Location: PACU  Anesthesia Type:General  Level of Consciousness: drowsy  Airway & Oxygen Therapy: Patient Spontanous Breathing and Patient connected to face mask oxygen  Post-op Assessment: Report given to RN and Post -op Vital signs reviewed and stable  Post vital signs: stable  Last Vitals:  Vitals Value Taken Time  BP 120/85 02/18/22 1107  Temp    Pulse 68 02/18/22 1111  Resp 23 02/18/22 1111  SpO2 99 % 02/18/22 1111  Vitals shown include unvalidated device data.  Last Pain:  Vitals:   02/18/22 0618  TempSrc: Temporal  PainSc: 0-No pain         Complications: No notable events documented.

## 2022-02-18 NOTE — Discharge Instructions (Signed)
Lori Harrell H. Lori Speakman, MD  [facility]  Phone: [phone]  Fax: [phone]   Discharge Instructions after Reverse Shoulder Replacement    1. Activity/Sling: You are to be non-weight bearing on operative extremity. A sling/shoulder immobilizer has been provided for you. Only remove the sling to perform elbow, wrist, and hand RoM exercises and hygiene/dressing. Active reaching and lifting are not permitted. You will be given further instructions on sling use at your first physical therapy visit and postoperative visit with Dr. Kimmie Berggren.   2. Dressings: Dressing may be removed at 1st physical therapy visit (~3-4 days after surgery). Afterwards, you may either leave open to air (if no drainage) or cover with dry, sterile dressing. If you have steri-strips on your wound, please do not remove them. They will fall off on their own. You may shower 5 days after surgery. Please pat incision dry. Do not rub or place any shear forces across incision. If there is drainage or any opening of incision after 5 days, please notify our offices immediately.    3. Driving:  Plan on not driving for six weeks. Please note that you are advised NOT to drive while taking narcotic pain medications as you may be impaired and unsafe to drive.   4. Medications:  - You have been provided a prescription for narcotic pain medicine (usually oxycodone). After surgery, take 1-2 narcotic tablets every 4 hours if needed for severe pain. Please start this as soon as you begin to start having pain (if you received a nerve block, start taking as soon as this wears off).  - A prescription for anti-nausea medication will be provided in case the narcotic medicine causes nausea - take 1 tablet every 6 hours only if nauseated.  - Take enteric coated aspirin 325 mg once daily for 6 weeks to prevent blood clots. Do not take aspirin if you have an aspirin sensitivity/allergy or asthma or are on an anticoagulant (blood thinner) already. If so, then  your home anticoagulant will be resume and managed - do not take aspirin. -Take tylenol 1000mg (2 Extra strength or 3 regular strength tablets) every 8 hours for pain. This will reduce the amount of narcotic medication needed. May stop tylenol when you are having minimal pain. - Take a stool softener (Colace, Dulcolax or Senakot) if you are using narcotic pain medications to help with constipation that is associated with narcotic use. - DO NOT take ANY nonsteroidal anti-inflammatory pain medications: Advil, Motrin, Ibuprofen, Aleve, Naproxen, or Naprosyn.   If you are taking prescription medication for anxiety, depression, insomnia, muscle spasm, chronic pain, or for attention deficit disorder you are advised that you are at a higher risk of adverse effects with use of narcotics post-op, including narcotic addiction/dependence, depressed breathing, death. If you use non-prescribed substances: alcohol, marijuana, cocaine, heroin, methamphetamines, etc., you are at a higher risk of adverse effects with use of narcotics post-op, including narcotic addiction/dependence, depressed breathing, death. You are advised that taking > 50 morphine milligram equivalents (MME) of narcotic pain medication per day results in twice the risk of overdose or death. For your prescription provided: oxycodone 5 mg - taking more than 6 tablets per day after the first few days of surgery.   5. Physical Therapy: 1-2 times per week for ~12 weeks. Therapy typically starts on post operative Day 3 or 4. You have been provided an order for physical therapy. The therapist will provide home exercises. Please contact our offices if this appointment has not been scheduled.      6. Work: May do light duty/desk job in approximately 2 weeks when off of narcotics, pain is well-controlled, and swelling has decreased if able to function with one arm in sling. Full work may take 6 weeks if light motions and function of both arms is required.  Lifting jobs may require 12 weeks.   7. Post-Op Appointments: Your first post-op appointment will be with Dr. Valine Drozdowski in approximately 2 weeks time.    If you find that they have not been scheduled please call the Orthopaedic Appointment front desk at [phone].                               Lori Harrell H. Lori Goodgame, MD [facility] Phone: [phone] Fax: [phone]   REVERSE SHOULDER ARTHROPLASTY REHAB GUIDELINES   These guidelines should be tailored to individual patients based on their rehab goals, age, precautions, quality of repair, etc.  Progression should be based on patient progress and approval by the referring physician.  PHASE 1 - Day 1 through Week 2  GENERAL GUIDELINES AND PRECAUTIONS Sling wear 24/7 except during grooming and home exercises (3 to 5 times daily) Avoid shoulder extension such that the arm is posterior the frontal plane.  When patients recline, a pillow should be placed behind the upper arm and sling should be on.  They should be advised to always be able to see the elbow Avoid combined IR/ADD/EXT, such as hand behind back to prevent dislocation Avoid combined IR and ADD such as reaching across the chest to prevent dislocation No AROM No submersion in pool/water for 4 weeks No weight bearing through operative arm (as in transfers, walker use, etc.)  GOALS Maintain integrity of joint replacement; protect soft tissue healing Increase PROM for elevation to 120 and ER to 30 (will remain the goal for first 6 weeks) Optimize distal UE circulation and muscle activity (elbow, wrist and hand) Instruct in use of sling for proper fit, polar care device for ice application after HEP, signs/symptoms of infection  EXERCISES Active elbow, wrist and hand Passive forward elevation in scapular plane to 90-120 max motion; ER in scapular plane to 30 Active scapular retraction with arms resting in neutral position  CRITERIA TO PROGRESS TO  PHASE 2 Low pain (less than 3/10) with shoulder PROM Healing of incision without signs of infection Clearance by MD to advance after 2 week MD check up  PHASE 2 - 2 weeks - 6 weeks  GENERAL GUIDELINES AND PRECAUTIONS Sling may be removed while at home; worn in community without abduction pillow May use arm for light activities of daily living (such as feeding, brushing teeth, dressing.) with elbow near  the side of the body  and arm in front of the body- no active lifting of the arm May submerge in water (tub, pool, Jacuzzi, etc.) after 4 weeks Continue to avoid WBing through the operative arm Continue to avoid combined IR/EXT/ADD (hand behind the back) and IR/ADD  (reaching across chest) for dislocation precautions  GOALS  Achieve passive elevation to 120 and ER to 30  Low (less than 3/10) to no pain  Ability to fire all heads of the deltoid  EXERCISES May discontinue grip, and active elbow and wrist exercises since using the arm in ADL's  with sling removed around the home Continue passive elevation to 120 and ER to 30, both in scapular plane with arm supported on table top Add submaximal isometrics, pain free effort, for all   functional heads of deltoid (anterior, posterior, middle)  Ensure that with posterior deltoid isometric the shoulder does not move into extension and the arm remains anterior the frontal plane At 4 weeks:  begin to place arm in balanced position of 90 deg elevation in supine; when patient able to hold this position with ease, may begin reverse pendulums clockwise and counterclockwise  CRITERIA TO PROGRESS TO PHASE 3 Passive forward elevation in scapular plane to 120; passive ER in scapular plane to 30 Ability to fire isometrically all heads of the deltoid muscle without pain Ability to place and hold the arm in balanced position (90 deg elevation in supine)  PHASE 3 - 6 weeks to 3 months  GENERAL GUIDELINES AND PRECAUTIONS Discontinue use of sling Avoid  forcing end range motion in any direction to prevent dislocation  May advance use of the arm actively in ADL's without being restricted to arm by the side of the body, however, avoid heavy lifting and sports (forever!) May initiate functional IR behind the back gently NO UPPER BODY ERGOMETER   GOALS Optimize PROM for elevation and ER in scapular plane with realistic expectation that max  mobility for elevation is usually around 145-160 passively; ER 40 to 50 passively; functional IR to L1 Recover AROM to approach as close to PROM available as possible; may expect 135-150 deg active elevation; 30 deg active ER; active functional IR to L1 Establish dynamic stability of the shoulder with deltoid and periscapular muscle gradual strengthening  EXERCISES Forward elevation in scapular plane active progression: supine to incline, to vertical; short to long lever arm Balanced position long lever arm AROM Active ER/IR with arm at side Scapular retraction with light band resistance Functional IR with hand slide up back - very gentle and gradual NO UPPER BODY ERGOMETER     CRITERIA TO PROGRESS TO PHASE 4  AROM equals/approaches PROM with good mechanics for elevation   No pain  Higher level demand on shoulder than ADL functions   PHASE 4 12 months and beyond  GENERAL GUIDELINES AND PRECAUTIONS No heavy lifting and no overhead sports No heavy pushing activity Gradually increase strength of deltoid and scapular stabilizers; also the rotator cuff if present with weights not to exceed 5 lbs NO UPPER BODY ERGOMETER   GOALS  Optimize functional use of the operative UE to meet the desired demands  Gradual increase in deltoid, scapular muscle, and rotator cuff strength  Pain free functional activities   EXERCISES Add light hand weights for deltoid up to and not to exceed 3 lbs for anterior and posterior with long arm lift against gravity; elbow bent to 90 deg for abduction in scapular  plane Theraband progression for extension to hip with scapular depression/retraction Theraband progression for serratus anterior punches in supine; avoid wall, incline or prone pressups for serratus anterior End range stretching gently without forceful overpressure in all planes (elevation in scapular plane, ER in scapular plane, functional IR) with stretching done for life as part of a daily routine NO UPPER BODY ERGOMETER     CRITERIA FOR DISCHARGE FROM SKILLED PHYSICAL THERAPY  Pain free AROM for shoulder elevation (expect around 135-150)  Functional strength for all ADL's, work tasks, and hobbies approved by surgeon  Independence with home maintenance program   NOTES: 1. With proper exercise, motion, strength, and function continue to improve even after one year. 2. The complication rate after surgery is 5 - 8%. Complications include infection, fracture, heterotopic bone formation, nerve injury, instability, rotator cuff   tear, and tuberosity nonunion. Please look for clinical signs, unusual symptoms, or lack of progress with therapy and report those to Dr. Jacinda Kanady. Prefer more communication than less.  3. The therapy plan above only serves as a guide. Please be aware of specific individualized patient instructions as written on the prescription or through discussions with the surgeon. 4. Please call Dr. Stacie Knutzen if you have any specific questions or concerns [phone]    

## 2022-02-18 NOTE — Anesthesia Procedure Notes (Signed)
Anesthesia Regional Block: Interscalene brachial plexus block   Pre-Anesthetic Checklist: , timeout performed,  Correct Patient, Correct Site, Correct Laterality,  Correct Procedure, Correct Position, site marked,  Risks and benefits discussed,  Surgical consent,  Pre-op evaluation,  At surgeon's request and post-op pain management  Laterality: Upper and Left  Prep: chloraprep       Needles:  Injection technique: Single-shot  Needle Type: Stimiplex     Needle Length: 9cm  Needle Gauge: 22     Additional Needles:   Procedures:,,,, ultrasound used (permanent image in chart),,    Narrative:  Start time: 02/18/2022 7:18 AM End time: 02/18/2022 7:19 AM Injection made incrementally with aspirations every 20 mL.  Performed by: Personally  Anesthesiologist: Iran Ouch, MD  Additional Notes: Patient consented for risk and benefits of nerve block including but not limited to nerve damage, failed block, bleeding and infection.  Patient voiced understanding.  Functioning IV was confirmed and monitors were applied.  Timeout done prior to procedure and prior to any sedation being given to the patient.  Patient confirmed procedure site prior to any sedation given to the patient.  A 50m 22ga Stimuplex needle was used. Sterile prep,hand hygiene and sterile gloves were used.  Minimal sedation used for procedure.  No paresthesia endorsed by patient during the procedure.  Negative aspiration and negative test dose prior to incremental administration of local anesthetic. The patient tolerated the procedure well with no immediate complications.

## 2022-02-18 NOTE — H&P (Signed)
Paper H&P to be scanned into permanent record. H&P reviewed. No significant changes noted.  

## 2022-02-18 NOTE — NC FL2 (Signed)
[place] LEVEL OF CARE SCREENING TOOL     IDENTIFICATION  Patient Name: Lori Harrell Birthdate: 07-07-1941 Sex: female Admission Date (Current Location): 02/18/2022  [facility] and [place] Number:  Engineering geologist and Address:  [facility], [address], [place], [place] [address]      Provider Number: [id number]  Attending Physician Name and Address:  Leim Fabry, MD  Relative Name and Phone Number:  Rosanna Randy son [phone]    Current Level of Care: Hospital Recommended Level of Care: [place] Prior Approval Number:    Date Approved/Denied:   PASRR Number: [id number]  Discharge Plan: SNF    Current Diagnoses: 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   Basal cell carcinoma (BCC) of skin of lip 05/31/2021   Elevated TSH 12/15/2020   S/P lumbar fusion 05/19/2019   Red blood cell antibody positive 02/03/2019   DDD (degenerative disc disease), lumbar 01/07/2019   Carotid artery stenosis, asymptomatic, bilateral 07/07/2017   Primary open angle glaucoma 05/08/2017   Old partial retinal detachment 05/08/2017   Large hiatal hernia 07/04/2016   Sigmoid diverticulitis 07/04/2016   Stress incontinence 07/04/2016   Type II diabetes mellitus with complication ([place]) 74/05/1447   Constipation 10/31/2015   Barrett's esophagus without dysplasia 06/16/2015   Essential (primary) hypertension 06/16/2015   Hyperlipidemia associated with type 2 diabetes mellitus ([place]) 06/16/2015   OP (osteoporosis) 06/16/2015   Hypercalcemia 04/22/2012    Orientation RESPIRATION BLADDER Height & Weight     Self, Time, Situation, Place  Normal Continent, External catheter Weight: 59.4 kg Height:  '5\' 1"'$  (154.9 cm)  BEHAVIORAL SYMPTOMS/MOOD NEUROLOGICAL BOWEL NUTRITION STATUS      Continent Diet (See dc summary)  AMBULATORY STATUS COMMUNICATION OF NEEDS Skin    Extensive Assist Verbally Normal                       Personal Care Assistance Level of Assistance  Bathing, Feeding Bathing Assistance: Limited assistance Feeding assistance: Independent       Functional Limitations Info             SPECIAL CARE FACTORS FREQUENCY  PT (By licensed PT), OT (By licensed OT)     PT Frequency: 5 times per week OT Frequency: 5 times per week            Contractures Contractures Info: Not present    Additional Factors Info  Code Status, Allergies Code Status Info: full code Allergies Info: NKDA           Current Medications (02/18/2022):  This is the current hospital active medication list Current Facility-Administered Medications  Medication Dose Route Frequency Provider Last Rate Last Admin   0.9 %  sodium chloride infusion   Intravenous Continuous Leim Fabry, MD 75 mL/hr at 02/18/22 1236 New Bag at 02/18/22 1236   acetaminophen (TYLENOL) tablet 1,000 mg  1,000 mg Oral Q8H Leim Fabry, MD       alum & mag hydroxide-simeth (MAALOX/MYLANTA) 200-200-20 MG/5ML suspension 30 mL  30 mL Oral Q4H PRN Leim Fabry, MD       [START ON 02/19/2022] aspirin EC tablet 325 mg  325 mg Oral Daily Leim Fabry, MD       atorvastatin (LIPITOR) tablet 20 mg  20 mg Oral QPM Leim Fabry, MD       bisacodyl (DULCOLAX) suppository 10 mg  10 mg Rectal Daily PRN  Leim Fabry, MD       ceFAZolin (ANCEF) IVPB 2g/100 mL premix  2 g Intravenous Q6H Leim Fabry, MD 200 mL/hr at 02/18/22 1308 2 g at 02/18/22 1308   docusate sodium (COLACE) capsule 100 mg  100 mg Oral BID Leim Fabry, MD   100 mg at 02/18/22 1308   dorzolamide-timolol (COSOPT) 22.3-6.8 MG/ML ophthalmic solution 1 drop  1 drop Both Eyes BID Leim Fabry, MD       enalapril (VASOTEC) tablet 20 mg  20 mg Oral Daily Leim Fabry, MD       fentaNYL (SUBLIMAZE) 50 MCG/ML injection            HYDROmorphone (DILAUDID) injection 0.2-0.4 mg  0.2-0.4 mg Intravenous Q4H PRN Leim Fabry, MD        insulin aspart (novoLOG) injection 0-9 Units  0-9 Units Subcutaneous TID WC Leim Fabry, MD   1 Units at 02/18/22 1318   latanoprost (XALATAN) 0.005 % ophthalmic solution 1 drop  1 drop Both Eyes QHS Leim Fabry, MD       menthol-cetylpyridinium (CEPACOL) lozenge 3 mg  1 lozenge Oral PRN Leim Fabry, MD       Or   phenol (CHLORASEPTIC) mouth spray 1 spray  1 spray Mouth/Throat PRN Leim Fabry, MD       metoCLOPramide (REGLAN) tablet 5-10 mg  5-10 mg Oral Q8H PRN Leim Fabry, MD       Or   metoCLOPramide (REGLAN) injection 5-10 mg  5-10 mg Intravenous Q8H PRN Leim Fabry, MD   10 mg at 02/18/22 1318   multivitamin with minerals tablet 1 tablet  1 tablet Oral Daily Leim Fabry, MD   1 tablet at 02/18/22 1308   ondansetron (ZOFRAN) tablet 4 mg  4 mg Oral Q6H PRN Leim Fabry, MD       Or   ondansetron [facility]) injection 4 mg  4 mg Intravenous Q6H PRN Leim Fabry, MD       oxyCODONE (Oxy IR/ROXICODONE) immediate release tablet 10-15 mg  10-15 mg Oral Q4H PRN Leim Fabry, MD       oxyCODONE (Oxy IR/ROXICODONE) immediate release tablet 5-10 mg  5-10 mg Oral Q4H PRN Leim Fabry, MD       Derrill Memo ON 02/19/2022] pantoprazole (PROTONIX) EC tablet 80 mg  80 mg Oral Q1200 Leim Fabry, MD       promethazine (PHENERGAN) 25 MG/ML injection            senna-docusate (Senokot-S) tablet 1 tablet  1 tablet Oral QHS PRN Leim Fabry, MD       spironolactone (ALDACTONE) tablet 25 mg  25 mg Oral Daily Leim Fabry, MD   25 mg at 02/18/22 1308     Discharge Medications: Please see discharge summary for a list of discharge medications.  Relevant Imaging Results:  Relevant Lab Results:   Additional Information SS# [id number]  Conception Oms, RN

## 2022-02-18 NOTE — TOC Progression Note (Signed)
Transition of Care [facility]) - Progression Note    Patient Details  Name: Lori Harrell MRN: [id number] Date of Birth: [birth year]  Transition of Care [facility]) CM/SW [place], RN Phone Number: 02/18/2022, 3:38 PM  Clinical Narrative:    Met with the patient and her son in the room, She is agreeable to a bed search and would like to go to [facility] or [place], I reached out to both and requested a bed, will review the bed offers once obtained        Expected Discharge Plan and Services                                                 Social Determinants of Health (SDOH) Interventions    Readmission Risk Interventions     View : No data to display.

## 2022-02-19 ENCOUNTER — Encounter: Payer: Self-pay | Admitting: Orthopedic Surgery

## 2022-02-19 LAB — GLUCOSE, CAPILLARY
Glucose-Capillary: 104 mg/dL — ABNORMAL HIGH (ref 70–99)
Glucose-Capillary: 117 mg/dL — ABNORMAL HIGH (ref 70–99)
Glucose-Capillary: 135 mg/dL — ABNORMAL HIGH (ref 70–99)
Glucose-Capillary: 128 mg/dL — ABNORMAL HIGH (ref 70–99)

## 2022-02-19 MED ORDER — ONDANSETRON HCL 4 MG PO TABS: 4.0000 mg | ORAL_TABLET | Freq: Four times a day (QID) | ORAL | 0 refills | Status: DC | PRN

## 2022-02-19 MED ORDER — ASPIRIN 325 MG PO TBEC
325.0000 mg | DELAYED_RELEASE_TABLET | Freq: Every day | ORAL | 0 refills | Status: DC
Start: 2022-02-19 — End: 2022-04-22

## 2022-02-19 MED ORDER — OXYCODONE HCL 5 MG PO TABS: 5.0000 mg | ORAL_TABLET | ORAL | 0 refills | Status: DC | PRN

## 2022-02-19 NOTE — Evaluation (Signed)
Occupational Therapy Evaluation Patient Details Name: Lori Harrell MRN: [id number] DOB: [birth year] Today's Date: 02/19/2022   History of Present Illness Pt is an 81 y.o. 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  PMH includes htn, hiatal hernia, DM, AVN, spine sx.   Clinical Impression   Patient was seen for an OT evaluation this date. Pt lives by herself in a level entry apartment and has limited PRN assist from her son at discharge. Prior to surgery, pt was active and generally independent, having just completed therapy for R shoulder injury. Pt has orders for LUE to be immobilized and will be NWBing per MD. Patient presents with impaired strength/ROM/sensation to LUE with block not completely resolved yet as well as impaired balance. These impairments result in a decreased ability to perform self care tasks requiring MOD A for UB ADL, MIN-MOD A for LB ADL tasks, MAX A for polar care mgt and shoulder sling mgt, and CGA-MIN A For ADL transfers. Pt/son instructed in polar care mgt, compression stocking mgt, falls prevention, home/routines modifications, AE/DME, shoulder precautions and how to maintain during ADL/IADL/mobility, positioning of LUE and considerations for sleep, and adaptive strategies for bathing/dressing/toileting/grooming to maximize falls prevention, safety, and independence. Handout provided to support recall and carryover. OT adjusted sling/immobilizer and polar care to improve comfort, optimize positioning, and to maximize skin integrity/safety. Pt/son verbalized understanding of all education/training provided. Pt will benefit from additional skilled OT services to address these limitations and improve independence in daily tasks. Recommend SNF for short term rehab to continue therapy to maximize return to PLOF, address  home/routines modifications and safety, minimize falls risk, and minimize caregiver burden.      Recommendations for follow up therapy are one component of a multi-disciplinary discharge planning process, led by the attending physician.  Recommendations may be updated based on patient status, additional functional criteria and insurance authorization.   Follow Up Recommendations  Skilled nursing-short term rehab (<3 hours/day)    Assistance Recommended at Discharge Intermittent Supervision/Assistance  Patient can return home with the following A little help with walking and/or transfers;A lot of help with bathing/dressing/bathroom;Assistance with cooking/housework;Assist for transportation;Help with stairs or ramp for entrance;Direct supervision/assist for medications management    Functional Status Assessment  Patient has had a recent decline in their functional status and demonstrates the ability to make significant improvements in function in a reasonable and predictable amount of time.  Equipment Recommendations  Other (comment) Management consultant)    Recommendations for Other Services       Precautions / Restrictions Precautions Precautions: Fall;Shoulder Shoulder Interventions: Shoulder abduction pillow;Off for dressing/bathing/exercises;At all times;Shoulder sling/immobilizer Precaution Booklet Issued: Yes (comment) Precaution Comments: L UE in sling at all times; may be off for dressing/bathing/exercise Required Braces or Orthoses: Sling Restrictions Weight Bearing Restrictions: Yes LUE Weight Bearing: Non weight bearing Other Position/Activity Restrictions: Per operative report "Operative arm to remain in sling at all times except RoM exercises and hygiene. Can perform pendulums, elbow/wrist/hand RoM exercises. Passive RoM allowed to 90 FF and 30 ER."      Mobility Bed Mobility               General bed mobility comments: Deferred (pt in recliner beginning/end of session)     Transfers Overall transfer level: Needs assistance Equipment used: None Transfers: Sit to/from Stand Sit to Stand: Min assist  Balance Overall balance assessment: Needs assistance Sitting-balance support: No upper extremity supported, Feet supported Sitting balance-Leahy Scale: Good     Standing balance support: No upper extremity supported, Single extremity supported Standing balance-Leahy Scale: Fair                             ADL either performed or assessed with clinical judgement   ADL Overall ADL's : Needs assistance/impaired                                       General ADL Comments: Pt currently requires MOD A for UB ADL and MIN-MOD A for LB ADL tasks, MAX A for polar care mgt and shoulder sling mgt. CGA-MIN A For ADL transfers.     Vision         Perception     Praxis      Pertinent Vitals/Pain Pain Assessment Pain Assessment: No/denies pain     Hand Dominance     Extremity/Trunk Assessment Upper Extremity Assessment Upper Extremity Assessment: LUE deficits/detail LUE Deficits / Details: block still in place, no pain, decr sensation LUE Sensation: decreased light touch;decreased proprioception LUE Coordination: decreased gross motor;decreased fine motor   Lower Extremity Assessment Lower Extremity Assessment: Generalized weakness   Cervical / Trunk Assessment Cervical / Trunk Assessment: Normal   Communication Communication Communication: No difficulties   Cognition Arousal/Alertness: Awake/alert Behavior During Therapy: WFL for tasks assessed/performed Overall Cognitive Status: Within Functional Limits for tasks assessed                                       General Comments  L UE in shoulder sling/immobilizer with abduction pillow    Exercises Other Exercises Other Exercises: Pt/son instructed in polar care mgt, compression stocking mgt, falls prevention, home/routines  modifications, AE/DME, shoulder precautions and how to maintain during ADL/IADL/mobility, positioning of LUE, and s/s infection. Handout provided.   Shoulder Instructions      Home Living Family/patient expects to be discharged to:: Private residence Living Arrangements: Alone Available Help at Discharge: Family;Available PRN/intermittently (son) Type of Home: Apartment Home Access: Level entry (1st floor)     Home Layout: One level     Bathroom Shower/Tub: Teacher, early years/pre: Standard (with toilet riser)     Home Equipment: Cane - single point;Toilet riser;Shower seat (suction cup grab bar in shower)          Prior Functioning/Environment Prior Level of Function : Independent/Modified Independent             Mobility Comments: Modified independent ambulating with SPC.  Pt with fall January 18th 2023 dislocating R shoulder and just finished OT yesterday for therapy for it. ADLs Comments: Pt indep        OT Problem List: Decreased strength;Decreased coordination;Decreased range of motion;Impaired sensation;Impaired balance (sitting and/or standing);Decreased knowledge of use of DME or AE;Impaired UE functional use;Decreased knowledge of precautions      OT Treatment/Interventions: Self-care/ADL training;Therapeutic exercise;Therapeutic activities;DME and/or AE instruction;Patient/family education;Balance training    OT Goals(Current goals can be found in the care plan section) Acute Rehab OT Goals Patient Stated Goal: get better and stay independent OT Goal Formulation: With patient/family Time For Goal Achievement: 03/05/22 Potential to Achieve Goals: Good ADL Goals Pt  Will Perform Upper Body Dressing: with caregiver independent in assisting;sitting Pt Will Perform Lower Body Dressing: with caregiver independent in assisting;sit to/from stand Pt Will Transfer to Toilet: with supervision;ambulating Additional ADL Goal #1: Pt will independently  instruct family/caregiver in polar care mgt Additional ADL Goal #2: Pt will independently instruct family/caregiver in shoulder sling mgt  OT Frequency: Min 2X/week    Co-evaluation              AM-PAC OT "6 Clicks" Daily Activity     Outcome Measure Help from another person eating meals?: A Little Help from another person taking care of personal grooming?: A Little Help from another person toileting, which includes using toliet, bedpan, or urinal?: A Little Help from another person bathing (including washing, rinsing, drying)?: A Lot Help from another person to put on and taking off regular upper body clothing?: A Lot Help from another person to put on and taking off regular lower body clothing?: A Lot 6 Click Score: 15   End of Session    Activity Tolerance: Patient tolerated treatment well Patient left: in chair;with call bell/phone within reach;with chair alarm set;with family/visitor present  OT Visit Diagnosis: Other abnormalities of gait and mobility (R26.89);Muscle weakness (generalized) (M62.81)                Time: 1749-4496 OT Time Calculation (min): 40 min Charges:  OT General Charges $OT Visit: 1 Visit OT Evaluation $OT Eval Moderate Complexity: 1 Mod OT Treatments $Self Care/Home Management : 23-37 mins  Ardeth Perfect., MPH, MS, OTR/L ascom [phone] 02/19/22, 3:02 PM

## 2022-02-19 NOTE — Progress Notes (Signed)
Physical Therapy Treatment Patient Details Name: Lori Harrell MRN: [id number] DOB: Mar 29, 1941 Today's Date: 02/19/2022   History of Present Illness Pt is an 81 y.o. 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  PMH includes htn, hiatal hernia, DM, AVN, spine sx.    PT Comments    Pt resting in recliner upon PT arrival and agreeable to PT session; pt's son present.  During session pt min assist with transfers and CGA to min assist to ambulate 180 feet with SPC.  Pt requesting to toilet end of session and pt assisted back to bed end of session to rest (pt reporting being tired from sitting up in recliner since this morning).  No L shoulder pain during session.  Will continue to focus on strengthening, balance, and progressive functional mobility per pt tolerance.    Recommendations for follow up therapy are one component of a multi-disciplinary discharge planning process, led by the attending physician.  Recommendations may be updated based on patient status, additional functional criteria and insurance authorization.  Follow Up Recommendations  Skilled nursing-short term rehab (<3 hours/day)     Assistance Recommended at Discharge Frequent or constant Supervision/Assistance  Patient can return home with the following A little help with walking and/or transfers;A little help with bathing/dressing/bathroom;Assistance with cooking/housework;Assist for transportation;Help with stairs or ramp for entrance   Equipment Recommendations  None recommended by PT (pt has SPC already)    Recommendations for Other Services OT consult     Precautions / Restrictions Precautions Precautions: Fall;Shoulder Shoulder Interventions: Shoulder abduction pillow;Off for dressing/bathing/exercises;At all times;Shoulder sling/immobilizer Precaution  Booklet Issued: Yes (comment) Precaution Comments: L UE in sling at all times; may be off for dressing/bathing/exercise Required Braces or Orthoses: Sling Restrictions Weight Bearing Restrictions: Yes LUE Weight Bearing: Non weight bearing Other Position/Activity Restrictions: Per operative report "Operative arm to remain in sling at all times except RoM exercises and hygiene. Can perform pendulums, elbow/wrist/hand RoM exercises. Passive RoM allowed to 90 FF and 30 ER."     Mobility  Bed Mobility Overal bed mobility: Needs Assistance Bed Mobility: Sit to Supine       Sit to supine: Supervision, HOB elevated   General bed mobility comments: vc's for technique; assist to scoot up in bed using bed sheet    Transfers Overall transfer level: Needs assistance Equipment used: None Transfers: Sit to/from Stand Sit to Stand: Min assist           General transfer comment: min assist for transfers from recliner x1 trial and from toilet x1 trial; assist to initiate stand and control descent sitting    Ambulation/Gait Ambulation/Gait assistance: Min guard, Min assist Gait Distance (Feet): 180 Feet Assistive device: Straight cane   Gait velocity: decreased     General Gait Details: occasional min assist to steady; increased B lateral sway   Stairs             Wheelchair Mobility    Modified Rankin (Stroke Patients Only)       Balance Overall balance assessment: Needs assistance Sitting-balance support: No upper extremity supported, Feet supported Sitting balance-Leahy Scale: Good Sitting balance - Comments: steady sitting reaching within BOS with R UE   Standing balance support: No upper extremity supported, Single extremity supported Standing balance-Leahy Scale: Fair Standing balance comment: steady standing washing R hand at sink  Cognition Arousal/Alertness: Awake/alert Behavior During Therapy: WFL for tasks  assessed/performed Overall Cognitive Status: Within Functional Limits for tasks assessed                                          Exercises      General Comments General comments (skin integrity, edema, etc.): L UE in shoulder sling/immobilizer with abduction pillow      Pertinent Vitals/Pain Pain Assessment Pain Assessment: No/denies pain Vitals (HR and O2 on room air) stable and WFL throughout treatment session.    Home Living            Prior Function            PT Goals (current goals can now be found in the care plan section) Acute Rehab PT Goals Patient Stated Goal: to improve function PT Goal Formulation: With patient Time For Goal Achievement: 03/04/22 Potential to Achieve Goals: Good Progress towards PT goals: Progressing toward goals    Frequency    BID      PT Plan Current plan remains appropriate    Co-evaluation              AM-PAC PT "6 Clicks" Mobility   Outcome Measure  Help needed turning from your back to your side while in a flat bed without using bedrails?: None Help needed moving from lying on your back to sitting on the side of a flat bed without using bedrails?: A Little Help needed moving to and from a bed to a chair (including a wheelchair)?: A Little Help needed standing up from a chair using your arms (e.g., wheelchair or bedside chair)?: A Little Help needed to walk in hospital room?: A Little Help needed climbing 3-5 steps with a railing? : A Lot 6 Click Score: 18    End of Session Equipment Utilized During Treatment: Gait belt (L UE sling/immobilizer) Activity Tolerance: Patient tolerated treatment well Patient left: with call bell/phone within reach;with SCD's reapplied;Other (comment);in bed;with bed alarm set;with family/visitor present (polar care in place) Nurse Communication: Mobility status;Precautions;Weight bearing status PT Visit Diagnosis: Unsteadiness on feet (R26.81);Muscle weakness  (generalized) (M62.81);History of falling (Z91.81);Pain Pain - Right/Left: Left Pain - part of body: Shoulder     Time: 6160-7371 PT Time Calculation (min) (ACUTE ONLY): 20 min  Charges:  $Therapeutic Activity: 8-22 mins                     Leitha Bleak, PT 02/19/22, 5:40 PM

## 2022-02-19 NOTE — TOC Progression Note (Signed)
Transition of Care [facility]) - Progression Note    Patient Details  Name: Louan Base MRN: [id number] Date of Birth: 01-27-1941  Transition [facility] Care [facility]) CM/SW [place], RN Phone Number: 02/19/2022, 10:36 AM  Clinical Narrative:    Met with the patient and the son in the room, Reviewed the bed offers, she accepted the bed at [facility], Her son will transport her   Expected Discharge Plan: [place] Barriers to Discharge: Ship broker, SNF Pending bed offer, Continued Medical Work up  Expected Discharge Plan and Services Expected Discharge Plan: [place]   Discharge Planning Services: CM Consult   Living arrangements for the past 2 months: Single Family Home                 DME Arranged: N/A                     Social Determinants of Health (SDOH) Interventions    Readmission Risk Interventions     View : No data to display.

## 2022-02-19 NOTE — Progress Notes (Signed)
  Subjective: 1 Day Post-Op Procedure(s) (LRB): Left reverse shoulder arthroplasty, biceps tenodesis (Left) Patient reports pain as mild.   Patient is well, and has had no acute complaints or problems Plan is to go Rehab after hospital stay.  The patient is asking for [facility] or [facility]. Negative for chest pain and shortness of breath Fever: no Gastrointestinal: Negative for nausea and vomiting  Objective: Vital signs in last 24 hours: Temp:  [96.7 F (35.9 C)-98.7 F (37.1 C)] 97.5 F (36.4 C) (05/23 0601) Pulse Rate:  [63-80] 80 (05/23 0601) Resp:  [16-26] 18 (05/23 0601) BP: (101-159)/(72-85) 116/76 (05/23 0601) SpO2:  [97 %-100 %] 98 % (05/23 0601)  Intake/Output from previous day:  Intake/Output Summary (Last 24 hours) at 02/19/2022 0701 Last data filed at 02/19/2022 0555 Gross per 24 hour  Intake 2240.87 ml  Output 1525 ml  Net 715.87 ml    Intake/Output this shift: No intake/output data recorded.  Labs: No results for input(s): HGB in the last 72 hours. No results for input(s): WBC, RBC, HCT, PLT in the last 72 hours. No results for input(s): NA, K, CL, CO2, BUN, CREATININE, GLUCOSE, CALCIUM in the last 72 hours. No results for input(s): LABPT, INR in the last 72 hours.   EXAM General - Patient is Alert and Oriented Extremity - Neurovascular intact Dorsiflexion/Plantar flexion intact her grip strength is intact.  She is able to give posterior pressure with her elbow. Dressing/Incision - clean, dry, with a Hemovac removed with no complication. Motor Function - intact, moving fingers and wrist well on exam.   Past Medical History:  Diagnosis Date   Acid reflux    Acute cholangitis 02/01/2019   Anemia    AVN (avascular necrosis of bone) ([place]) 07/07/2017   Barrett's esophagus    Basal cell carcinoma (BCC) of skin of lip 05/31/2021   Choledocholithiasis    Diabetes mellitus without complication (HCC)    type 2   Elevated lipids    Glaucoma     Heart murmur    History of detached retina repair    History of hiatal hernia    Hypercholesterolemia    Hypertension    Osteoporosis    Sigmoid diverticulitis    Unstageable pressure ulcer of back ([place]) 01/07/2019   Vitreous hemorrhage, right eye ([place]) 1992    Assessment/Plan: 1 Day Post-Op Procedure(s) (LRB): Left reverse shoulder arthroplasty, biceps tenodesis (Left) Principal Problem:   S/p reverse total shoulder arthroplasty  Estimated body mass index is 24.75 kg/m as calculated from the following:   Height as of this encounter: '5\' 1"'$  (1.549 m).   Weight as of this encounter: 59.4 kg. Advance diet Up with therapy D/C IV fluids  Discharge planning to rehab.  Plan for Tuesday.  DVT Prophylaxis - Aspirin and Foot Pumps Left shoulder in the shoulder immobilizer at all times, except for bathing and physical therapy  Reche Dixon, PA-C Orthopaedic Surgery 02/19/2022, 7:01 AM

## 2022-02-19 NOTE — Progress Notes (Signed)
Physical Therapy Treatment Patient Details Name: Lori Harrell MRN: [id number] DOB: 1941-02-11 Today's Date: 02/19/2022   History of Present Illness Pt is an 81 y.o. 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  PMH includes htn, hiatal hernia, DM, AVN, spine sx.    PT Comments    Pt resting in recliner upon PT arrival; agreeable to PT session but requesting to toilet first.  During session pt min assist with transfers and CGA to min assist to ambulate 130 feet with SPC.  No reports of pain during session.  Will continue to focus on strengthening, progressive functional mobility, and balance during hospitalization.    Recommendations for follow up therapy are one component of a multi-disciplinary discharge planning process, led by the attending physician.  Recommendations may be updated based on patient status, additional functional criteria and insurance authorization.  Follow Up Recommendations  Skilled nursing-short term rehab (<3 hours/day)     Assistance Recommended at Discharge Frequent or constant Supervision/Assistance  Patient can return home with the following A little help with walking and/or transfers;A little help with bathing/dressing/bathroom;Assistance with cooking/housework;Assist for transportation;Help with stairs or ramp for entrance   Equipment Recommendations  None recommended by PT (pt has SPC already)    Recommendations for Other Services OT consult     Precautions / Restrictions Precautions Precautions: Fall Precaution Comments: L UE in sling at all times; may be off for dressing/bathing/exercise Required Braces or Orthoses: Sling Restrictions Weight Bearing Restrictions: Yes LUE Weight Bearing: Non weight bearing Other Position/Activity Restrictions: Per operative report "Operative arm to  remain in sling at all times except RoM exercises and hygiene. Can perform pendulums, elbow/wrist/hand RoM exercises. Passive RoM allowed to 90 FF and 30 ER."     Mobility  Bed Mobility               General bed mobility comments: Deferred (pt in recliner beginning/end of session)    Transfers Overall transfer level: Needs assistance Equipment used: Straight cane Transfers: Sit to/from Stand Sit to Stand: Min assist           General transfer comment: min assist for transfers from recliner x1 trial and from toilet x1 trial; assist to initiate stand and control descent sitting    Ambulation/Gait Ambulation/Gait assistance: Min guard, Min assist Gait Distance (Feet): 130 Feet Assistive device: Straight cane   Gait velocity: decreased     General Gait Details: intermittent min assist to steady; increased B lateral sway   Stairs             Wheelchair Mobility    Modified Rankin (Stroke Patients Only)       Balance Overall balance assessment: Needs assistance Sitting-balance support: No upper extremity supported, Feet supported Sitting balance-Leahy Scale: Good Sitting balance - Comments: steady sitting reaching within BOS with R UE   Standing balance support: No upper extremity supported Standing balance-Leahy Scale: Fair Standing balance comment: steady standing washing R hand at sink                            Cognition Arousal/Alertness: Awake/alert Behavior During Therapy: WFL for tasks assessed/performed Overall Cognitive Status: Within Functional Limits for tasks assessed  Exercises      General Comments General comments (skin integrity, edema, etc.): L UE in shoulder sling/immobilizer with abduction pillow      Pertinent Vitals/Pain Pain Assessment Pain Assessment: No/denies pain Vitals (HR and O2 on room air) stable and WFL throughout treatment session.    Home  Living                          Prior Function            PT Goals (current goals can now be found in the care plan section) Acute Rehab PT Goals Patient Stated Goal: to improve function PT Goal Formulation: With patient Time For Goal Achievement: 03/04/22 Potential to Achieve Goals: Good Progress towards PT goals: Progressing toward goals    Frequency    BID      PT Plan Current plan remains appropriate    Co-evaluation              AM-PAC PT "6 Clicks" Mobility   Outcome Measure  Help needed turning from your back to your side while in a flat bed without using bedrails?: None Help needed moving from lying on your back to sitting on the side of a flat bed without using bedrails?: A Little Help needed moving to and from a bed to a chair (including a wheelchair)?: A Little Help needed standing up from a chair using your arms (e.g., wheelchair or bedside chair)?: A Little Help needed to walk in hospital room?: A Little Help needed climbing 3-5 steps with a railing? : A Lot 6 Click Score: 18    End of Session Equipment Utilized During Treatment: Gait belt (L UE sling/immobilizer) Activity Tolerance: Patient tolerated treatment well Patient left: in chair;with call bell/phone within reach;with chair alarm set;with SCD's reapplied;Other (comment) (polar care in place) Nurse Communication: Mobility status;Precautions;Weight bearing status PT Visit Diagnosis: Unsteadiness on feet (R26.81);Muscle weakness (generalized) (M62.81);History of falling (Z91.81);Pain Pain - Right/Left: Left Pain - part of body: Shoulder     Time: 6720-9470 PT Time Calculation (min) (ACUTE ONLY): 18 min  Charges:  $Therapeutic Activity: 8-22 mins                     Leitha Bleak, PT 02/19/22, 12:25 PM

## 2022-02-20 LAB — TYPE AND SCREEN
ABO/RH(D): A POS
Antibody Screen: POSITIVE
Unit division: 0
Unit division: 0

## 2022-02-20 LAB — GLUCOSE, CAPILLARY
Glucose-Capillary: 111 mg/dL — ABNORMAL HIGH (ref 70–99)
Glucose-Capillary: 167 mg/dL — ABNORMAL HIGH (ref 70–99)
Glucose-Capillary: 96 mg/dL (ref 70–99)
Glucose-Capillary: 119 mg/dL — ABNORMAL HIGH (ref 70–99)

## 2022-02-20 LAB — BPAM RBC
Blood Product Expiration Date: 202306042359
Blood Product Expiration Date: 202306072359
Unit Type and Rh: 6200
Unit Type and Rh: 6200

## 2022-02-20 LAB — SURGICAL PATHOLOGY

## 2022-02-20 NOTE — Progress Notes (Signed)
  Subjective: 2 Days Post-Op Procedure(s) (LRB): Left reverse shoulder arthroplasty, biceps tenodesis (Left) Patient reports pain as mild.   Patient is well, and has had no acute complaints or problems Plan is to go Rehab after hospital stay.  The patient is planning on going to [employer] [facility]. Negative for chest pain and shortness of breath Fever: no Gastrointestinal: Negative for nausea and vomiting  Objective: Vital signs in last 24 hours: Temp:  [97.8 F (36.6 C)-98.8 F (37.1 C)] 98 F (36.7 C) (05/24 0415) Pulse Rate:  [75-87] 78 (05/24 0415) Resp:  [16] 16 (05/24 0415) BP: (90-123)/(57-82) 123/77 (05/24 0415) SpO2:  [98 %-100 %] 99 % (05/24 0415)  Intake/Output from previous day:  Intake/Output Summary (Last 24 hours) at 02/20/2022 0643 Last data filed at 02/19/2022 0953 Gross per 24 hour  Intake 440 ml  Output --  Net 440 ml    Intake/Output this shift: No intake/output data recorded.  Labs: No results for input(s): HGB in the last 72 hours. No results for input(s): WBC, RBC, HCT, PLT in the last 72 hours. No results for input(s): NA, K, CL, CO2, BUN, CREATININE, GLUCOSE, CALCIUM in the last 72 hours. No results for input(s): LABPT, INR in the last 72 hours.   EXAM General - Patient is Alert and Oriented Extremity - Neurovascular intact Dorsiflexion/Plantar flexion intact her grip strength is intact.  She is able to give posterior pressure with her elbow.  She has sensation with good grip strength. Dressing/Incision - clean, dry, and intact. Motor Function - intact, moving fingers and wrist well on exam.  The patient ambulated 180 feet.  Past Medical History:  Diagnosis Date   Acid reflux    Acute cholangitis 02/01/2019   Anemia    AVN (avascular necrosis of bone) ([place]) 07/07/2017   Barrett's esophagus    Basal cell carcinoma (BCC) of skin of lip 05/31/2021   Choledocholithiasis    Diabetes mellitus without complication (HCC)    type 2    Elevated lipids    Glaucoma    Heart murmur    History of detached retina repair    History of hiatal hernia    Hypercholesterolemia    Hypertension    Osteoporosis    Sigmoid diverticulitis    Unstageable pressure ulcer of back ([place]) 01/07/2019   Vitreous hemorrhage, right eye ([place]) 1992    Assessment/Plan: 2 Days Post-Op Procedure(s) (LRB): Left reverse shoulder arthroplasty, biceps tenodesis (Left) Principal Problem:   S/p reverse total shoulder arthroplasty  Estimated body mass index is 24.75 kg/m as calculated from the following:   Height as of this encounter: '5\' 1"'$  (1.549 m).   Weight as of this encounter: 59.4 kg. Advance diet Up with therapy D/C IV fluids  Discharge planning to rehab.  Plan for Thursday to [facility].  DVT Prophylaxis - Aspirin and Foot Pumps Left shoulder in the shoulder immobilizer at all times, except for bathing and physical therapy  Reche Dixon, PA-C Orthopaedic Surgery 02/20/2022, 6:43 AM

## 2022-02-20 NOTE — Progress Notes (Signed)
Occupational Therapy Treatment Patient Details Name: Lori Harrell MRN: [id number] DOB: [birth year] Today's Date: 02/20/2022   History of present illness Pt is an 81 y.o. 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  PMH includes htn, hiatal hernia, DM, AVN, spine sx.   OT comments  Chart reviewed, pt greeted in bedside chair agreeable to OT tx session. Tx session targeted additional review of sling management and LUE HEP per MD protocol.Written hand out  provided re: HEP with good adherence and understanding. Improvements noted in LUE function however pt will benefit from additional education for optimal HEP completion and safe ADL completion. Discharge recommendation remains appropriate. OT will continue to follow acutely.    Recommendations for follow up therapy are one component of a multi-disciplinary discharge planning process, led by the attending physician.  Recommendations may be updated based on patient status, additional functional criteria and insurance authorization.    Follow Up Recommendations  Skilled nursing-short term rehab (<3 hours/day)    Assistance Recommended at Discharge Intermittent Supervision/Assistance  Patient can return home with the following  A little help with walking and/or transfers;A lot of help with bathing/dressing/bathroom;Assistance with cooking/housework;Assist for transportation;Help with stairs or ramp for entrance;Direct supervision/assist for medications management   Equipment Recommendations    reacher   Recommendations for Other Services      Precautions / Restrictions Precautions Precautions: Fall;Shoulder Shoulder Interventions: Shoulder abduction pillow;Off for dressing/bathing/exercises;At all times;Shoulder sling/immobilizer Required Braces or Orthoses:  Sling Restrictions Weight Bearing Restrictions: Yes LUE Weight Bearing: Non weight bearing Other Position/Activity Restrictions: Per operative report "Operative arm to remain in sling at all times except RoM exercises and hygiene. Can perform pendulums, elbow/wrist/hand RoM exercises. Passive RoM allowed to 90 FF and 30 ER."       Mobility Bed Mobility               General bed mobility comments: NT in recliner pre/post session    Transfers                         Balance Overall balance assessment: Needs assistance Sitting-balance support: No upper extremity supported, Feet supported Sitting balance-Leahy Scale: Good     Standing balance support: Single extremity supported, During functional activity, Reliant on assistive device for balance Standing balance-Leahy Scale: Fair                             ADL either performed or assessed with clinical judgement   ADL Overall ADL's : Needs assistance/impaired                                       General ADL Comments: MOD A for donning/doffing sling with step by step vcs; STS with sup    Extremity/Trunk Assessment              Vision       Perception     Praxis      Cognition Arousal/Alertness: Awake/alert Behavior During Therapy: WFL for tasks assessed/performed Overall Cognitive Status: Within Functional Limits for tasks assessed  Exercises General Exercises - Upper Extremity Shoulder Flexion: AAROM (to 90 per MD protocol) Elbow Flexion: AROM, 10 reps Elbow Extension: AROM, 10 reps Wrist Flexion: AROM, 10 reps Wrist Extension: AROM, 10 reps    Shoulder Instructions Shoulder Instructions Donning/doffing sling/immobilizer: Moderate assistance Pendulum exercises (written home exercise program): Min-guard ROM for elbow, wrist and digits of operated UE: Supervision/safety     General Comments       Pertinent Vitals/ Pain       Pain Assessment Pain Assessment: 0-10 Pain Score: 3  Pain Location: L shoulder Pain Descriptors / Indicators: Discomfort Pain Intervention(s): Limited activity within patient's tolerance, Monitored during session, Repositioned, Ice applied  Home Living                                          Prior Functioning/Environment              Frequency  Min 2X/week        Progress Toward Goals  OT Goals(current goals can now be found in the care plan section)  Progress towards OT goals: Progressing toward goals     Plan Discharge plan remains appropriate    Co-evaluation                 AM-PAC OT "6 Clicks" Daily Activity     Outcome Measure   Help from another person eating meals?: A Little Help from another person taking care of personal grooming?: A Little Help from another person toileting, which includes using toliet, bedpan, or urinal?: A Little Help from another person bathing (including washing, rinsing, drying)?: A Lot Help from another person to put on and taking off regular upper body clothing?: A Lot Help from another person to put on and taking off regular lower body clothing?: A Lot 6 Click Score: 15    End of Session    OT Visit Diagnosis: Other abnormalities of gait and mobility (R26.89);Muscle weakness (generalized) (M62.81)   Activity Tolerance Patient tolerated treatment well   Patient Left in chair;with call bell/phone within reach;with chair alarm set;with family/visitor present   Nurse Communication Mobility status        Time: 6237-6283 OT Time Calculation (min): 16 min  Charges: OT General Charges $OT Visit: 1 Visit OT Treatments $Therapeutic Exercise: 8-22 mins  Shanon Payor, OTD OTR/L  02/20/22, 1:54 PM

## 2022-02-20 NOTE — Discharge Summary (Incomplete Revision)
Physician Discharge Summary  Subjective: 2 Days Post-Op Procedure(s) (LRB): Left reverse shoulder arthroplasty, biceps tenodesis (Left) Patient reports pain as mild.   Patient seen in rounds with Dr. [name]. Patient is well, and has had no acute complaints or problems Patient is ready to go to [facility] for physical therapy and rehab.  Physician Discharge Summary  Patient ID: Lori Harrell MRN: [id number] DOB/AGE: [birth year] 81 y.o.  Admit date: 02/18/2022 Discharge date: ***  Admission Diagnoses:  Discharge Diagnoses:  Principal Problem:   S/p reverse total shoulder arthroplasty   Discharged Condition: good  Hospital Course: The patient is postop day 2 from a reverse shoulder replacement on the left.  She is doing extremely well with physical therapy already.  Her vitals have remained stable.  She will continue doing physical therapy until Feb 21, 2022 when she can go to rehab at [facility].***  Treatments: surgery:  1.  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  Left humeral head avascular necrosis   PROCEDURES:  1. Left reverse total shoulder arthroplasty 2. Left biceps tenodesis   SURGEON: Cato Mulligan, MD   ASSISTANT: Anitra Lauth, PA   ANESTHESIA: Gen + interscalene block   ESTIMATED BLOOD LOSS: 150cc   TOTAL IV FLUIDS: See anesthesia record   IMPLANTS: Tornier: Perform plus reversed  37m half wedge augment baseplate 35 degree with 374mcentral screw; superior compression screw, anterior locking screw, and inferior locking screw to glenoid baseplate; 36 mm, standard glenosphere; size 1B standard humeral stem; High (3.19m47meccentric reversed tray; standard reversed insert +6 mm; 1.7mm12mrclage cables x 2  Discharge Exam: Blood pressure 123/77, pulse 78, temperature 98 F (36.7 C), temperature source Oral, resp. rate 16, height '5\' 1"'$  (1.549 m), weight 59.4 kg, SpO2 99 %.   Disposition:    Allergies as of 02/20/2022   No  Known Allergies      Medication List     TAKE these medications    acetaminophen 500 MG tablet Commonly known as: TYLENOL Take 1,000 mg by mouth daily as needed (back pain.).   aspirin EC 325 MG tablet Take 1 tablet (325 mg total) by mouth daily.   atorvastatin 20 MG tablet Commonly known as: LIPITOR TAKE 1 TABLET DAILY What changed: when to take this   Biofreeze 10 % Liqd Generic drug: Menthol (Topical Analgesic) Apply 1 application. topically 3 (three) times daily as needed (pain.).   CALCIUM 1200+D3 PO Take 1 tablet by mouth every evening.   dorzolamide-timolol 22.3-6.8 MG/ML ophthalmic solution Commonly known as: COSOPT Place 1 drop into both eyes 2 (two) times daily.   enalapril 20 MG tablet Commonly known as: VASOTEC TAKE 1 TABLET DAILY   esomeprazole 40 MG capsule Commonly known as: NEXIUM Take 1 capsule (40 mg total) by mouth 2 (two) times daily.   freestyle lancets USE 1 LANCET DAILY   FREESTYLE LITE test strip Generic drug: glucose blood USE TO TEST BLOOD SUGAR DAILY   latanoprost 0.005 % ophthalmic solution Commonly known as: XALATAN Place 1 drop into both eyes at bedtime.   metFORMIN 500 MG tablet Commonly known as: GLUCOPHAGE TAKE 1 TABLET DAILY   multivitamin with minerals Tabs tablet Take 1 tablet by mouth in the morning. Women's Centrum Silver   ondansetron 4 MG tablet Commonly known as: ZOFRAN Take 1 tablet (4 mg total) by mouth every 6 (six) hours as needed for nausea.   oxyCODONE 5 MG immediate release tablet Commonly known as: Oxy IR/ROXICODONE Take  1-2 tablets (5-10 mg total) by mouth every 4 (four) hours as needed for moderate pain (pain score 4-6).   spironolactone 25 MG tablet Commonly known as: ALDACTONE TAKE 1 TABLET DAILY        Follow-up Information     Leim Fabry, MD Follow up in 2 week(s).   Specialty: Orthopedic Surgery Why: For wound care and x-rays Contact information: [address] [place] [address] [phone]                 Signed: Prescott Parma, Eliana Lueth 02/20/2022, 6:45 AM   Objective: Vital signs in last 24 hours: Temp:  [97.8 F (36.6 C)-98.8 F (37.1 C)] 98 F (36.7 C) (05/24 0415) Pulse Rate:  [75-87] 78 (05/24 0415) Resp:  [16] 16 (05/24 0415) BP: (90-123)/(57-82) 123/77 (05/24 0415) SpO2:  [98 %-100 %] 99 % (05/24 0415)  Intake/Output from previous day:  Intake/Output Summary (Last 24 hours) at 02/20/2022 0645 Last data filed at 02/19/2022 0953 Gross per 24 hour  Intake 440 ml  Output --  Net 440 ml    Intake/Output this shift: No intake/output data recorded.  Labs: No results for input(s): HGB in the last 72 hours. No results for input(s): WBC, RBC, HCT, PLT in the last 72 hours. No results for input(s): NA, K, CL, CO2, BUN, CREATININE, GLUCOSE, CALCIUM in the last 72 hours. No results for input(s): LABPT, INR in the last 72 hours.  EXAM: General - Patient is Alert and Oriented Extremity - Neurovascular intact Sensation intact distally Dorsiflexion/Plantar flexion intact Incision - clean, dry, no drainage with the shoulder immobilizer in place. Motor Function -grip strength intact.  Dorsiflexion and volar flexion of the wrist intact.  Assessment/Plan: 2 Days Post-Op Procedure(s) (LRB): Left reverse shoulder arthroplasty, biceps tenodesis (Left) Procedure(s) (LRB): Left reverse shoulder arthroplasty, biceps tenodesis (Left) Past Medical History:  Diagnosis Date   Acid reflux    Acute cholangitis 02/01/2019   Anemia    AVN (avascular necrosis of bone) ([place]) 07/07/2017   Barrett's esophagus    Basal cell carcinoma (BCC) of skin of lip 05/31/2021   Choledocholithiasis    Diabetes mellitus without complication (HCC)    type 2   Elevated lipids    Glaucoma    Heart murmur    History of detached retina repair    History of hiatal hernia    Hypercholesterolemia    Hypertension    Osteoporosis    Sigmoid diverticulitis    Unstageable  pressure ulcer of back ([place]) 01/07/2019   Vitreous hemorrhage, right eye ([place]) 1992   Principal Problem:   S/p reverse total shoulder arthroplasty  Estimated body mass index is 24.75 kg/m as calculated from the following:   Height as of this encounter: '5\' 1"'$  (1.549 m).   Weight as of this encounter: 59.4 kg. Advance diet Up with therapy Plan to discharge to rehab on Thursday. Diet - Regular diet Follow up - in 2 weeks Activity -normal ambulation with her shoulder immobilizer in place at all times.  Only to be removed for bathing and physical therapy. Disposition - Rehab Condition Upon Discharge - Good DVT Prophylaxis - Aspirin  Reche Dixon, PA-C Orthopaedic Surgery 02/20/2022, 6:45 AM

## 2022-02-20 NOTE — Progress Notes (Signed)
Physical Therapy Treatment Patient Details Name: Lori Harrell MRN: [id number] DOB: [birth year] Today's Date: 02/20/2022   History of Present Illness Pt is an 81 y.o. 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  PMH includes htn, hiatal hernia, DM, AVN, spine sx.    PT Comments    Pt was sitting in recliner upon arriving. She is A and O x 4 and extremely motivated and pleasant. Pt lives alone and does not have 24/7 assistance available.Lori Harrell continues to require assistance with all mobility, transfers, and gait. Unable to perform ADLs independently. She reports several falls throughout the past year. Highly recommend DC to SNF to address deficits with strength, balance, and activity tolerance, while maximizing independence with ADLs   Recommendations for follow up therapy are one component of a multi-disciplinary discharge planning process, led by the attending physician.  Recommendations may be updated based on patient status, additional functional criteria and insurance authorization.  Follow Up Recommendations  Skilled nursing-short term rehab (<3 hours/day)     Assistance Recommended at Discharge Frequent or constant Supervision/Assistance  Patient can return home with the following A little help with walking and/or transfers;A little help with bathing/dressing/bathroom;Assistance with cooking/housework;Assist for transportation;Help with stairs or ramp for entrance   Equipment Recommendations  None recommended by PT       Precautions / Restrictions Precautions Precautions: Fall;Shoulder Shoulder Interventions: Shoulder abduction pillow;Off for dressing/bathing/exercises;At all times;Shoulder sling/immobilizer Precaution Booklet Issued: Yes (comment) Precaution Comments: L UE in sling at all times; may be off for  dressing/bathing/exercise Required Braces or Orthoses: Sling Restrictions Weight Bearing Restrictions: Yes LUE Weight Bearing: Non weight bearing     Mobility  Bed Mobility    General bed mobility comments: In recliner pre/post session    Transfers Overall transfer level: Needs assistance Equipment used: Straight cane Transfers: Sit to/from Stand Sit to Stand: Min assist  General transfer comment: min assist to safely STS from recliner height. vcs for hand placement while holding SPC    Ambulation/Gait Ambulation/Gait assistance: Min guard, Min assist Gait Distance (Feet): 200 Feet Assistive device: Straight cane Gait Pattern/deviations: Step-through pattern, Drifts right/left Gait velocity: decreased     General Gait Details: Pt was able to ambulate 200 ft with SPC however is SOB. needed 2 standing rest to catch breath. Vcs for energy conservation.    Balance Overall balance assessment: Needs assistance Sitting-balance support: No upper extremity supported, Feet supported Sitting balance-Leahy Scale: Good     Standing balance support: Single extremity supported, During functional activity, Reliant on assistive device for balance Standing balance-Leahy Scale: Fair Standing balance comment: pt has had several falls in past year. high fall risk       Cognition Arousal/Alertness: Awake/alert Behavior During Therapy: WFL for tasks assessed/performed Overall Cognitive Status: Within Functional Limits for tasks assessed    General Comments: pt is A and O x 4 and extremely pleasant           General Comments General comments (skin integrity, edema, etc.): reviewed importance of elbow and wrist ROM/exercises to prevent contractures.      Pertinent Vitals/Pain Pain Assessment Pain Assessment: 0-10 Pain Score: 2  Pain Location: shoulder Pain Descriptors / Indicators: Discomfort Pain Intervention(s): Limited activity within patient's tolerance, Monitored during  session, Repositioned, Ice applied     PT Goals (current goals can now be found in the care  plan section) Acute Rehab PT Goals Patient Stated Goal: rehab then home Progress towards PT goals: Progressing toward goals    Frequency    BID      PT Plan Current plan remains appropriate       AM-PAC PT "6 Clicks" Mobility   Outcome Measure  Help needed turning from your back to your side while in a flat bed without using bedrails?: None Help needed moving from lying on your back to sitting on the side of a flat bed without using bedrails?: A Little Help needed moving to and from a bed to a chair (including a wheelchair)?: A Little Help needed standing up from a chair using your arms (e.g., wheelchair or bedside chair)?: A Little Help needed to walk in hospital room?: A Little Help needed climbing 3-5 steps with a railing? : A Lot 6 Click Score: 18    End of Session Equipment Utilized During Treatment: Other (comment) (sling with abduction pillow/ SPC) Activity Tolerance: Patient tolerated treatment well Patient left: in chair;with call bell/phone within reach Nurse Communication: Mobility status;Precautions;Weight bearing status PT Visit Diagnosis: Unsteadiness on feet (R26.81);Muscle weakness (generalized) (M62.81);History of falling (Z91.81);Pain Pain - Right/Left: Left Pain - part of body: Shoulder     Time: 6948-5462 PT Time Calculation (min) (ACUTE ONLY): 22 min  Charges:  $Gait Training: 8-22 mins                     Julaine Fusi PTA 02/20/22, 9:45 AM

## 2022-02-20 NOTE — Plan of Care (Signed)
  Problem: Education: Goal: Knowledge of General Education information will improve Description: Including pain rating scale, medication(s)/side effects and non-pharmacologic comfort measures Outcome: Progressing   Problem: Health Behavior/Discharge Planning: Goal: Ability to manage health-related needs will improve Outcome: Progressing   Problem: Clinical Measurements: Goal: Ability to maintain clinical measurements within normal limits will improve Outcome: Progressing   Problem: Clinical Measurements: Goal: Will remain free from infection Outcome: Progressing   Problem: Clinical Measurements: Goal: Diagnostic test results will improve Outcome: Progressing   Problem: Clinical Measurements: Goal: Respiratory complications will improve Outcome: Progressing   Problem: Activity: Goal: Risk for activity intolerance will decrease Outcome: Progressing   Problem: Nutrition: Goal: Adequate nutrition will be maintained Outcome: Progressing   Problem: Coping: Goal: Level of anxiety will decrease Outcome: Progressing   Problem: Elimination: Goal: Will not experience complications related to urinary retention Outcome: Progressing   Problem: Pain Managment: Goal: General experience of comfort will improve Outcome: Progressing   Problem: Safety: Goal: Ability to remain free from injury will improve Outcome: Progressing   Problem: Pain Management: Goal: Pain level will decrease with appropriate interventions Outcome: Progressing

## 2022-02-20 NOTE — Progress Notes (Signed)
Physical Therapy Treatment Patient Details Name: Lori Harrell MRN: [id number] DOB: 1940/10/04 Today's Date: 02/20/2022   History of Present Illness Pt is an 81 y.o. 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  PMH includes htn, hiatal hernia, DM, AVN, spine sx.    PT Comments    Pt was sitting in recliner upon arriving. She agrees to session and remains motivated and cooperative throughout. She requires assistance to perform ADLs safely and will greatly benefit from SNF to progress to PLOF. Acute PT will continue to follow and progress as able per POC.    Recommendations for follow up therapy are one component of a multi-disciplinary discharge planning process, led by the attending physician.  Recommendations may be updated based on patient status, additional functional criteria and insurance authorization.  Follow Up Recommendations  Skilled nursing-short term rehab (<3 hours/day)     Assistance Recommended at Discharge Frequent or constant Supervision/Assistance  Patient can return home with the following A little help with walking and/or transfers;A little help with bathing/dressing/bathroom;Assistance with cooking/housework;Assist for transportation;Help with stairs or ramp for entrance   Equipment Recommendations  None recommended by PT       Precautions / Restrictions Precautions Precautions: Fall;Shoulder Shoulder Interventions: Shoulder abduction pillow;Off for dressing/bathing/exercises;At all times;Shoulder sling/immobilizer Precaution Booklet Issued: Yes (comment) Precaution Comments: L UE in sling at all times; may be off for dressing/bathing/exercise Required Braces or Orthoses: Sling Restrictions Weight Bearing Restrictions: Yes LUE Weight Bearing: Non weight bearing Other Position/Activity Restrictions:  Per operative report "Operative arm to remain in sling at all times except RoM exercises and hygiene. Can perform pendulums, elbow/wrist/hand RoM exercises. Passive RoM allowed to 90 FF and 30 ER."     Mobility  Bed Mobility  General bed mobility comments: in recliner pre/post session    Transfers Overall transfer level: Needs assistance Equipment used: Straight cane Transfers: Sit to/from Stand Sit to Stand: Min assist           General transfer comment: min assist to safely STS from recliner height. vcs for hand placement while holding SPC    Ambulation/Gait Ambulation/Gait assistance: Min guard Gait Distance (Feet): 200 Feet Assistive device: Straight cane Gait Pattern/deviations: Step-through pattern Gait velocity: decreased     General Gait Details: Pt was able to ambulate 200 ft with SPC without LOB however will benefit from continued skilled PT to address higher level balance deficits.     Balance Overall balance assessment: Needs assistance Sitting-balance support: No upper extremity supported, Feet supported Sitting balance-Leahy Scale: Good     Standing balance support: Single extremity supported, During functional activity, Reliant on assistive device for balance Standing balance-Leahy Scale: Fair Standing balance comment: pt has had several falls in past year. high fall risk       Cognition Arousal/Alertness: Awake/alert Behavior During Therapy: WFL for tasks assessed/performed Overall Cognitive Status: Within Functional Limits for tasks assessed    General Comments: pt is A and O x 4 and extremely pleasant               Pertinent Vitals/Pain Pain Assessment Pain Assessment: 0-10 Pain Score: 4  Pain Location: L shoulder Pain Descriptors / Indicators: Discomfort Pain Intervention(s): Limited activity within patient's tolerance, Monitored during session, Premedicated before session, Repositioned     PT Goals (current goals can now be found in  the care plan section)  Acute Rehab PT Goals Patient Stated Goal: rehab then home Progress towards PT goals: Progressing toward goals    Frequency    BID      PT Plan Current plan remains appropriate       AM-PAC PT "6 Clicks" Mobility   Outcome Measure  Help needed turning from your back to your side while in a flat bed without using bedrails?: None Help needed moving from lying on your back to sitting on the side of a flat bed without using bedrails?: A Little Help needed moving to and from a bed to a chair (including a wheelchair)?: A Little Help needed standing up from a chair using your arms (e.g., wheelchair or bedside chair)?: A Little Help needed to walk in hospital room?: A Little Help needed climbing 3-5 steps with a railing? : A Lot 6 Click Score: 18    End of Session Equipment Utilized During Treatment: Other (comment) (sling with abduction pillow) Activity Tolerance: Patient tolerated treatment well Patient left: in chair;with call bell/phone within reach Nurse Communication: Mobility status;Precautions;Weight bearing status PT Visit Diagnosis: Unsteadiness on feet (R26.81);Muscle weakness (generalized) (M62.81);History of falling (Z91.81);Pain Pain - Right/Left: Left Pain - part of body: Shoulder     Time: 7017-7939 PT Time Calculation (min) (ACUTE ONLY): 15 min  Charges:  $Gait Training: 8-22 mins                     Julaine Fusi PTA 02/20/22, 4:34 PM

## 2022-02-20 NOTE — Discharge Summary (Cosign Needed)
Physician Discharge Summary  Subjective: 3 Days Post-Op Procedure(s) (LRB): Left reverse shoulder arthroplasty, biceps tenodesis (Left) Patient reports pain as mild.   Patient seen in rounds with Dr. [name]. Patient is well, and has had no acute complaints or problems Patient is ready to go to [facility] for physical therapy and rehab.  Physician Discharge Summary  Patient ID: Lori Harrell MRN: [id number] DOB/AGE: 1941/09/25 81 y.o.  Admit date: 02/18/2022 Discharge date: 02/21/2022  Admission Diagnoses:  Discharge Diagnoses:  Principal Problem:   S/p reverse total shoulder arthroplasty   Discharged Condition: good  Hospital Course: The patient is postop day 2 from a reverse shoulder replacement on the left.  She is doing extremely well with physical therapy already.  She is ambulating 200 feet.  Her vitals have remained stable.  She will continue doing physical therapy until Feb 21, 2022 when she can go to rehab at [facility].  Her pain has begun to come back since the nerve block is wearing off, but this is tolerable.  Treatments: surgery:  1.  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  Left humeral head avascular necrosis   PROCEDURES:  1. Left reverse total shoulder arthroplasty 2. Left biceps tenodesis   SURGEON: Cato Mulligan, MD   ASSISTANT: Anitra Lauth, PA   ANESTHESIA: Gen + interscalene block   ESTIMATED BLOOD LOSS: 150cc   TOTAL IV FLUIDS: See anesthesia record   IMPLANTS: Tornier: Perform plus reversed  73m half wedge augment baseplate 35 degree with 367mcentral screw; superior compression screw, anterior locking screw, and inferior locking screw to glenoid baseplate; 36 mm, standard glenosphere; size 1B standard humeral stem; High (3.47m13meccentric reversed tray; standard reversed insert +6 mm; 1.7mm16mrclage cables x 2  Discharge Exam: Blood pressure 121/71, pulse 86, temperature 98 F (36.7 C), resp. rate 16, height  '5\' 1"'$  (1.549 m), weight 59.4 kg, SpO2 99 %.   Disposition: Discharge disposition: 03-Skilled Nursing Facility        Allergies as of 02/21/2022   No Known Allergies      Medication List     TAKE these medications    acetaminophen 500 MG tablet Commonly known as: TYLENOL Take 1,000 mg by mouth daily as needed (back pain.).   aspirin EC 325 MG tablet Take 1 tablet (325 mg total) by mouth daily.   atorvastatin 20 MG tablet Commonly known as: LIPITOR TAKE 1 TABLET DAILY What changed: when to take this   Biofreeze 10 % Liqd Generic drug: Menthol (Topical Analgesic) Apply 1 application. topically 3 (three) times daily as needed (pain.).   CALCIUM 1200+D3 PO Take 1 tablet by mouth every evening.   dorzolamide-timolol 22.3-6.8 MG/ML ophthalmic solution Commonly known as: COSOPT Place 1 drop into both eyes 2 (two) times daily.   enalapril 20 MG tablet Commonly known as: VASOTEC TAKE 1 TABLET DAILY   esomeprazole 40 MG capsule Commonly known as: NEXIUM Take 1 capsule (40 mg total) by mouth 2 (two) times daily.   freestyle lancets USE 1 LANCET DAILY   FREESTYLE LITE test strip Generic drug: glucose blood USE TO TEST BLOOD SUGAR DAILY   latanoprost 0.005 % ophthalmic solution Commonly known as: XALATAN Place 1 drop into both eyes at bedtime.   metFORMIN 500 MG tablet Commonly known as: GLUCOPHAGE TAKE 1 TABLET DAILY   multivitamin with minerals Tabs tablet Take 1 tablet by mouth in the morning. Women's Centrum Silver   ondansetron 4 MG tablet Commonly known as:  ZOFRAN Take 1 tablet (4 mg total) by mouth every 6 (six) hours as needed for nausea.   oxyCODONE 5 MG immediate release tablet Commonly known as: Oxy IR/ROXICODONE Take 1-2 tablets (5-10 mg total) by mouth every 4 (four) hours as needed for moderate pain (pain score 4-6).   spironolactone 25 MG tablet Commonly known as: ALDACTONE TAKE 1 TABLET DAILY        Follow-up Information      Leim Fabry, MD Follow up in 2 week(s).   Specialty: Orthopedic Surgery Why: For wound care and x-rays Contact information: [place] [phone]                 Signed: Prescott Parma, [place] Santilli 02/21/2022, 7:05 AM   Objective: Vital signs in last 24 hours: Temp:  [97.8 F (36.6 C)-98.4 F (36.9 C)] 98 F (36.7 C) (05/25 0312) Pulse Rate:  [83-87] 86 (05/25 0312) Resp:  [16] 16 (05/25 0312) BP: (94-126)/(66-86) 121/71 (05/25 0312) SpO2:  [99 %-100 %] 99 % (05/25 0312)  Intake/Output from previous day:  Intake/Output Summary (Last 24 hours) at 02/21/2022 0705 Last data filed at 02/20/2022 2317 Gross per 24 hour  Intake 740 ml  Output 0 ml  Net 740 ml    Intake/Output this shift: No intake/output data recorded.  Labs: No results for input(s): HGB in the last 72 hours. No results for input(s): WBC, RBC, HCT, PLT in the last 72 hours. No results for input(s): NA, K, CL, CO2, BUN, CREATININE, GLUCOSE, CALCIUM in the last 72 hours. No results for input(s): LABPT, INR in the last 72 hours.  EXAM: General - Patient is Alert and Oriented Extremity - Neurovascular intact Sensation intact distally Dorsiflexion/Plantar flexion intact Incision - clean, dry, no drainage with the shoulder immobilizer in place. Motor Function -grip strength intact.  Dorsiflexion and volar flexion of the wrist intact.  Assessment/Plan: 3 Days Post-Op Procedure(s) (LRB): Left reverse shoulder arthroplasty, biceps tenodesis (Left) Procedure(s) (LRB): Left reverse shoulder arthroplasty, biceps tenodesis (Left) Past Medical History:  Diagnosis Date   Acid reflux    Acute cholangitis 02/01/2019   Anemia    AVN (avascular necrosis of bone) ([place]) 07/07/2017   Barrett's esophagus    Basal cell carcinoma (BCC) of skin of lip 05/31/2021   Choledocholithiasis    Diabetes mellitus without complication (HCC)    type 2   Elevated lipids    Glaucoma    Heart murmur     History of detached retina repair    History of hiatal hernia    Hypercholesterolemia    Hypertension    Osteoporosis    Sigmoid diverticulitis    Unstageable pressure ulcer of back ([place]) 01/07/2019   Vitreous hemorrhage, right eye ([place]) 1992   Principal Problem:   S/p reverse total shoulder arthroplasty  Estimated body mass index is 24.75 kg/m as calculated from the following:   Height as of this encounter: '5\' 1"'$  (1.549 m).   Weight as of this encounter: 59.4 kg. Advance diet Up with therapy Plan to discharge to rehab on Thursday. Diet - Regular diet Follow up - in 2 weeks Activity -normal ambulation with her shoulder immobilizer in place at all times.  Only to be removed for bathing and physical therapy. Disposition - Rehab Condition Upon Discharge - Good DVT Prophylaxis - Aspirin  Reche Dixon, PA-C Orthopaedic Surgery 02/21/2022, 7:05 AM

## 2022-02-21 DIAGNOSIS — M6281 Muscle weakness (generalized): Secondary | ICD-10-CM | POA: Diagnosis not present

## 2022-02-21 DIAGNOSIS — I10 Essential (primary) hypertension: Secondary | ICD-10-CM | POA: Diagnosis not present

## 2022-02-21 DIAGNOSIS — E119 Type 2 diabetes mellitus without complications: Secondary | ICD-10-CM | POA: Insufficient documentation

## 2022-02-21 DIAGNOSIS — D649 Anemia, unspecified: Secondary | ICD-10-CM | POA: Diagnosis not present

## 2022-02-21 DIAGNOSIS — K8080 Other cholelithiasis without obstruction: Secondary | ICD-10-CM | POA: Diagnosis not present

## 2022-02-21 DIAGNOSIS — Z96698 Presence of other orthopedic joint implants: Secondary | ICD-10-CM | POA: Diagnosis not present

## 2022-02-21 DIAGNOSIS — M25512 Pain in left shoulder: Secondary | ICD-10-CM | POA: Diagnosis not present

## 2022-02-21 DIAGNOSIS — M810 Age-related osteoporosis without current pathological fracture: Secondary | ICD-10-CM | POA: Diagnosis not present

## 2022-02-21 DIAGNOSIS — K2270 Barrett's esophagus without dysplasia: Secondary | ICD-10-CM | POA: Diagnosis not present

## 2022-02-21 DIAGNOSIS — E782 Mixed hyperlipidemia: Secondary | ICD-10-CM | POA: Diagnosis not present

## 2022-02-21 DIAGNOSIS — H409 Unspecified glaucoma: Secondary | ICD-10-CM | POA: Diagnosis not present

## 2022-02-21 DIAGNOSIS — M8580 Other specified disorders of bone density and structure, unspecified site: Secondary | ICD-10-CM | POA: Diagnosis not present

## 2022-02-21 DIAGNOSIS — Z5189 Encounter for other specified aftercare: Secondary | ICD-10-CM | POA: Insufficient documentation

## 2022-02-21 DIAGNOSIS — Z7982 Long term (current) use of aspirin: Secondary | ICD-10-CM | POA: Insufficient documentation

## 2022-02-21 DIAGNOSIS — K219 Gastro-esophageal reflux disease without esophagitis: Secondary | ICD-10-CM | POA: Diagnosis not present

## 2022-02-21 DIAGNOSIS — R011 Cardiac murmur, unspecified: Secondary | ICD-10-CM | POA: Diagnosis not present

## 2022-02-21 DIAGNOSIS — H40053 Ocular hypertension, bilateral: Secondary | ICD-10-CM | POA: Diagnosis not present

## 2022-02-21 DIAGNOSIS — Z471 Aftercare following joint replacement surgery: Secondary | ICD-10-CM | POA: Diagnosis not present

## 2022-02-21 DIAGNOSIS — M87012 Idiopathic aseptic necrosis of left shoulder: Secondary | ICD-10-CM | POA: Diagnosis not present

## 2022-02-21 DIAGNOSIS — K5732 Diverticulitis of large intestine without perforation or abscess without bleeding: Secondary | ICD-10-CM | POA: Diagnosis not present

## 2022-02-21 DIAGNOSIS — Z7984 Long term (current) use of oral hypoglycemic drugs: Secondary | ICD-10-CM | POA: Diagnosis not present

## 2022-02-21 DIAGNOSIS — E7800 Pure hypercholesterolemia, unspecified: Secondary | ICD-10-CM | POA: Diagnosis not present

## 2022-02-21 DIAGNOSIS — Z96612 Presence of left artificial shoulder joint: Secondary | ICD-10-CM | POA: Diagnosis not present

## 2022-02-21 LAB — GLUCOSE, CAPILLARY: Glucose-Capillary: 102 mg/dL — ABNORMAL HIGH (ref 70–99)

## 2022-02-21 NOTE — Care Management Important Message (Signed)
Important Message  Patient Details  Name: Lori Harrell MRN: [id number] Date of Birth: 23-May-1941   Medicare Important Message Given:  N/A - LOS <3 / Initial given by admissions     [name] 02/21/2022, 8:14 AM

## 2022-02-21 NOTE — TOC Progression Note (Signed)
Transition of Care [facility]) - Progression Note    Patient Details  Name: Lori Harrell MRN: [id number] Date of Birth: [birth year]  [facility] CM/SW [place], RN Phone Number: 02/21/2022, 8:45 AM  Clinical Narrative:   Patient to DC to [facility] today with her son transporting at lunch time,     Expected Discharge Plan: Skilled Nursing Facility Barriers to Discharge: Continued Medical Work up  Expected Discharge Plan and Services Expected Discharge Plan: [place]   Discharge Planning Services: CM Consult   Living arrangements for the past 2 months: Single Family Home Expected Discharge Date: 02/21/22               DME Arranged: N/A                     Social Determinants of Health (SDOH) Interventions    Readmission Risk Interventions     View : No data to display.

## 2022-02-21 NOTE — Progress Notes (Addendum)
Physical Therapy Treatment Patient Details Name: Lori Harrell MRN: [id number] DOB: 08-Jul-1941 Today's Date: 02/21/2022   History of Present Illness Pt is an 81 y.o. 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  PMH includes htn, hiatal hernia, DM, AVN, spine sx.    PT Comments    Pt was supine in bed upon arriving. She is A and O x 4 and agreeable to PT session. She requested to use the BR to urinate. Pt required assistance to exit bed, stand and ambulate. Successfully urinated prior to ambulating 120 ft with SPC. Rehab continues to be most appropriate DC disposition due to pt living alone. She will benefit from SNF to address deficits while maximizing independence with all ADLs.    Recommendations for follow up therapy are one component of a multi-disciplinary discharge planning process, led by the attending physician.  Recommendations may be updated based on patient status, additional functional criteria and insurance authorization.  Follow Up Recommendations  Skilled nursing-short term rehab (<3 hours/day)     Assistance Recommended at Discharge Frequent or constant Supervision/Assistance  Patient can return home with the following A little help with walking and/or transfers;A little help with bathing/dressing/bathroom;Assistance with cooking/housework;Assist for transportation;Help with stairs or ramp for entrance   Equipment Recommendations  None recommended by PT       Precautions / Restrictions Precautions Precautions: Fall;Shoulder Shoulder Interventions: Shoulder abduction pillow;Off for dressing/bathing/exercises;At all times;Shoulder sling/immobilizer Precaution Booklet Issued: Yes (comment) Precaution Comments: L UE in sling at all times; may be off for dressing/bathing/exercise Required Braces or Orthoses:  Sling Restrictions Weight Bearing Restrictions: Yes LUE Weight Bearing: Non weight bearing     Mobility  Bed Mobility Overal bed mobility: Needs Assistance Bed Mobility: Sit to Supine, Supine to Sit     Supine to sit: Min assist, HOB elevated     General bed mobility comments: min assist to exit bed safely    Transfers Overall transfer level: Needs assistance Equipment used: Straight cane Transfers: Sit to/from Stand Sit to Stand: Min assist    General transfer comment: pt required min assist to stand from toilet and EOB. vcs for handplacemen and increased fwd wt shift    Ambulation/Gait Ambulation/Gait assistance: Min guard Gait Distance (Feet): 120 Feet Assistive device: Straight cane Gait Pattern/deviations: Step-through pattern Gait velocity: decreased     General Gait Details: pt ambulated and tolerated 120 ft with SPC     Balance Overall balance assessment: Needs assistance Sitting-balance support: No upper extremity supported, Feet supported Sitting balance-Leahy Scale: Good     Standing balance support: Single extremity supported, During functional activity, Reliant on assistive device for balance Standing balance-Leahy Scale: Fair       Cognition Arousal/Alertness: Awake/alert Behavior During Therapy: WFL for tasks assessed/performed Overall Cognitive Status: Within Functional Limits for tasks assessed    General Comments: pt is A and O x 4 and extremely pleasant           General Comments General comments (skin integrity, edema, etc.): Reviewed expectations for rehab and need to continue to perform UE ROM in both elbow/wrist      Pertinent Vitals/Pain Pain Assessment Pain Assessment: 0-10 Pain Score: 2  Pain Location: L shoulder Pain Descriptors / Indicators: Discomfort Pain Intervention(s): Limited activity within patient's tolerance, Monitored during session, Premedicated before session, Repositioned     PT Goals (current goals  can  now be found in the care plan section) Acute Rehab PT Goals Patient Stated Goal: rehab then home Progress towards PT goals: Progressing toward goals    Frequency    BID      PT Plan Current plan remains appropriate       AM-PAC PT "6 Clicks" Mobility   Outcome Measure  Help needed turning from your back to your side while in a flat bed without using bedrails?: None Help needed moving from lying on your back to sitting on the side of a flat bed without using bedrails?: A Little Help needed moving to and from a bed to a chair (including a wheelchair)?: A Little Help needed standing up from a chair using your arms (e.g., wheelchair or bedside chair)?: A Little Help needed to walk in hospital room?: A Little Help needed climbing 3-5 steps with a railing? : A Lot 6 Click Score: 18    End of Session Equipment Utilized During Treatment: Other (comment) (sling with abduction pillow) Activity Tolerance: Patient tolerated treatment well Patient left: in chair;with call bell/phone within reach;with chair alarm set Nurse Communication: Mobility status;Precautions;Weight bearing status PT Visit Diagnosis: Unsteadiness on feet (R26.81);Muscle weakness (generalized) (M62.81);History of falling (Z91.81);Pain Pain - Right/Left: Left Pain - part of body: Shoulder     Time: 0722-0747 PT Time Calculation (min) (ACUTE ONLY): 25 min  Charges:  $Gait Training: 8-22 mins $Therapeutic Activity: 8-22 mins                    Julaine Fusi PTA 02/21/22, 8:46 AM

## 2022-02-21 NOTE — Progress Notes (Signed)
Blood pressure 126/80, pulse 91, temperature 97.8 F (36.6 C), resp. rate 16, height '5\' 1"'$  (1.549 m), weight 59.4 kg, SpO2 100 %. IV removed site c/d/I discussed d.c packet with pt and son. Son to transport pt to [facility], report called to facility. Pt d/c with all belongings down to private car.

## 2022-02-21 NOTE — Progress Notes (Signed)
  Subjective: 3 Days Post-Op Procedure(s) (LRB): Left reverse shoulder arthroplasty, biceps tenodesis (Left) Patient reports pain as mild to moderate.  Her nerve block began to wear off last night. Patient is well, and has had no acute complaints or problems Plan is to go Rehab after hospital stay.  The patient is planning on going to [employer]. Negative for chest pain and shortness of breath Fever: no Gastrointestinal: Negative for nausea and vomiting  Objective: Vital signs in last 24 hours: Temp:  [97.8 F (36.6 C)-98.4 F (36.9 C)] 98 F (36.7 C) (05/25 0312) Pulse Rate:  [83-87] 86 (05/25 0312) Resp:  [16] 16 (05/25 0312) BP: (94-126)/(66-86) 121/71 (05/25 0312) SpO2:  [99 %-100 %] 99 % (05/25 0312)  Intake/Output from previous day:  Intake/Output Summary (Last 24 hours) at 02/21/2022 0704 Last data filed at 02/20/2022 2317 Gross per 24 hour  Intake 740 ml  Output 0 ml  Net 740 ml    Intake/Output this shift: No intake/output data recorded.  Labs: No results for input(s): HGB in the last 72 hours. No results for input(s): WBC, RBC, HCT, PLT in the last 72 hours. No results for input(s): NA, K, CL, CO2, BUN, CREATININE, GLUCOSE, CALCIUM in the last 72 hours. No results for input(s): LABPT, INR in the last 72 hours.   EXAM General - Patient is Alert and Oriented Extremity - Neurovascular intact Dorsiflexion/Plantar flexion intact her grip strength is intact.  She is able to give posterior pressure with her elbow.  She has sensation with good grip strength. Dressing/Incision - clean, dry, and intact. Motor Function - intact, moving fingers and wrist well on exam.  The patient ambulated 200 feet.  Past Medical History:  Diagnosis Date   Acid reflux    Acute cholangitis 02/01/2019   Anemia    AVN (avascular necrosis of bone) ([place]) 07/07/2017   Barrett's esophagus    Basal cell carcinoma (BCC) of skin of lip 05/31/2021   Choledocholithiasis     Diabetes mellitus without complication (HCC)    type 2   Elevated lipids    Glaucoma    Heart murmur    History of detached retina repair    History of hiatal hernia    Hypercholesterolemia    Hypertension    Osteoporosis    Sigmoid diverticulitis    Unstageable pressure ulcer of back ([place]) 01/07/2019   Vitreous hemorrhage, right eye ([place]) 1992    Assessment/Plan: 3 Days Post-Op Procedure(s) (LRB): Left reverse shoulder arthroplasty, biceps tenodesis (Left) Principal Problem:   S/p reverse total shoulder arthroplasty  Estimated body mass index is 24.75 kg/m as calculated from the following:   Height as of this encounter: '5\' 1"'$  (1.549 m).   Weight as of this encounter: 59.4 kg. Advance diet Up with therapy D/C IV fluids  Discharge planning to rehab.  Plan for today to [facility].  DVT Prophylaxis - Aspirin and Foot Pumps Left shoulder in the shoulder immobilizer at all times, except for bathing and physical therapy  Reche Dixon, PA-C Orthopaedic Surgery 02/21/2022, 7:04 AM

## 2022-02-21 NOTE — Plan of Care (Signed)
  Problem: Education: Goal: Knowledge of General Education information will improve Description: Including pain rating scale, medication(s)/side effects and non-pharmacologic comfort measures Outcome: Progressing   Problem: Health Behavior/Discharge Planning: Goal: Ability to manage health-related needs will improve Outcome: Progressing   Problem: Clinical Measurements: Goal: Will remain free from infection Outcome: Progressing   Problem: Clinical Measurements: Goal: Diagnostic test results will improve Outcome: Progressing   Problem: Clinical Measurements: Goal: Respiratory complications will improve Outcome: Progressing   Problem: Clinical Measurements: Goal: Cardiovascular complication will be avoided Outcome: Progressing   Problem: Activity: Goal: Risk for activity intolerance will decrease Outcome: Progressing   Problem: Nutrition: Goal: Adequate nutrition will be maintained Outcome: Progressing   Problem: Pain Managment: Goal: General experience of comfort will improve Outcome: Progressing   Problem: Safety: Goal: Ability to remain free from injury will improve Outcome: Progressing   Problem: Activity: Goal: Ability to tolerate increased activity will improve Outcome: Progressing   Problem: Pain Management: Goal: Pain level will decrease with appropriate interventions Outcome: Progressing

## 2022-02-25 DIAGNOSIS — M25512 Pain in left shoulder: Secondary | ICD-10-CM | POA: Diagnosis not present

## 2022-02-25 DIAGNOSIS — K219 Gastro-esophageal reflux disease without esophagitis: Secondary | ICD-10-CM | POA: Diagnosis not present

## 2022-02-25 DIAGNOSIS — E782 Mixed hyperlipidemia: Secondary | ICD-10-CM | POA: Diagnosis not present

## 2022-02-25 DIAGNOSIS — I10 Essential (primary) hypertension: Secondary | ICD-10-CM | POA: Diagnosis not present

## 2022-02-25 DIAGNOSIS — M6281 Muscle weakness (generalized): Secondary | ICD-10-CM | POA: Diagnosis not present

## 2022-02-25 DIAGNOSIS — E119 Type 2 diabetes mellitus without complications: Secondary | ICD-10-CM | POA: Diagnosis not present

## 2022-02-26 ENCOUNTER — Telehealth: Payer: Self-pay

## 2022-02-26 ENCOUNTER — Inpatient Hospital Stay: Payer: Medicare Other | Admitting: Internal Medicine

## 2022-02-26 NOTE — Telephone Encounter (Signed)
Appointment cancelled

## 2022-02-26 NOTE — Telephone Encounter (Signed)
Called pt left VM to call back.  Pt has a hospital follow up appt scheduled for today. If pt does not have any problems with her shoulder she does not need to be seen today and appt can be cancelled.   KP

## 2022-02-28 DIAGNOSIS — H409 Unspecified glaucoma: Secondary | ICD-10-CM | POA: Diagnosis not present

## 2022-02-28 DIAGNOSIS — K219 Gastro-esophageal reflux disease without esophagitis: Secondary | ICD-10-CM | POA: Diagnosis not present

## 2022-02-28 DIAGNOSIS — D649 Anemia, unspecified: Secondary | ICD-10-CM | POA: Diagnosis not present

## 2022-02-28 DIAGNOSIS — E782 Mixed hyperlipidemia: Secondary | ICD-10-CM | POA: Diagnosis not present

## 2022-02-28 DIAGNOSIS — E119 Type 2 diabetes mellitus without complications: Secondary | ICD-10-CM | POA: Diagnosis not present

## 2022-02-28 DIAGNOSIS — M6281 Muscle weakness (generalized): Secondary | ICD-10-CM | POA: Diagnosis not present

## 2022-02-28 DIAGNOSIS — Z96698 Presence of other orthopedic joint implants: Secondary | ICD-10-CM | POA: Diagnosis not present

## 2022-02-28 DIAGNOSIS — K8080 Other cholelithiasis without obstruction: Secondary | ICD-10-CM | POA: Diagnosis not present

## 2022-02-28 DIAGNOSIS — I10 Essential (primary) hypertension: Secondary | ICD-10-CM | POA: Diagnosis not present

## 2022-02-28 DIAGNOSIS — K2270 Barrett's esophagus without dysplasia: Secondary | ICD-10-CM | POA: Diagnosis not present

## 2022-02-28 DIAGNOSIS — Z471 Aftercare following joint replacement surgery: Secondary | ICD-10-CM | POA: Diagnosis not present

## 2022-02-28 DIAGNOSIS — M810 Age-related osteoporosis without current pathological fracture: Secondary | ICD-10-CM | POA: Diagnosis not present

## 2022-03-04 DIAGNOSIS — M6281 Muscle weakness (generalized): Secondary | ICD-10-CM | POA: Diagnosis not present

## 2022-03-04 DIAGNOSIS — E119 Type 2 diabetes mellitus without complications: Secondary | ICD-10-CM | POA: Diagnosis not present

## 2022-03-04 DIAGNOSIS — K219 Gastro-esophageal reflux disease without esophagitis: Secondary | ICD-10-CM | POA: Diagnosis not present

## 2022-03-04 DIAGNOSIS — E782 Mixed hyperlipidemia: Secondary | ICD-10-CM | POA: Diagnosis not present

## 2022-03-04 DIAGNOSIS — M25512 Pain in left shoulder: Secondary | ICD-10-CM | POA: Diagnosis not present

## 2022-03-04 DIAGNOSIS — I10 Essential (primary) hypertension: Secondary | ICD-10-CM | POA: Diagnosis not present

## 2022-03-05 DIAGNOSIS — M87012 Idiopathic aseptic necrosis of left shoulder: Secondary | ICD-10-CM | POA: Diagnosis not present

## 2022-03-07 ENCOUNTER — Telehealth: Payer: Self-pay | Admitting: Internal Medicine

## 2022-03-07 NOTE — Telephone Encounter (Signed)
Home Health Verbal Orders - Caller/Agency: [facility] Number: [phone]  Requesting OT/PT/Skilled Nursing/Social Work/Speech Therapy: Continued PT, OT, and Skilled Nursing (in home)  Frequency:

## 2022-03-07 NOTE — Telephone Encounter (Signed)
Spoke tp Santiago Glad gave her verbal orders. She verbalized understanding.  KP

## 2022-03-08 ENCOUNTER — Telehealth: Payer: Self-pay | Admitting: Internal Medicine

## 2022-03-08 NOTE — Telephone Encounter (Signed)
Pt stated she had surgery and is just now returning back home and she had several messages. Pt requests call back to go over her lab results. Cb# [phone]

## 2022-03-08 NOTE — Telephone Encounter (Signed)
Patient called and advised no recent labs have been done. She said it was several messages on her machine and she just got home from rehab after shoulder surgery, so she was just calling back.

## 2022-03-12 ENCOUNTER — Telehealth: Payer: Self-pay | Admitting: Internal Medicine

## 2022-03-12 DIAGNOSIS — I10 Essential (primary) hypertension: Secondary | ICD-10-CM | POA: Diagnosis not present

## 2022-03-12 DIAGNOSIS — K449 Diaphragmatic hernia without obstruction or gangrene: Secondary | ICD-10-CM | POA: Diagnosis not present

## 2022-03-12 DIAGNOSIS — E7800 Pure hypercholesterolemia, unspecified: Secondary | ICD-10-CM | POA: Diagnosis not present

## 2022-03-12 DIAGNOSIS — Z90710 Acquired absence of both cervix and uterus: Secondary | ICD-10-CM | POA: Diagnosis not present

## 2022-03-12 DIAGNOSIS — E119 Type 2 diabetes mellitus without complications: Secondary | ICD-10-CM | POA: Diagnosis not present

## 2022-03-12 DIAGNOSIS — Z9049 Acquired absence of other specified parts of digestive tract: Secondary | ICD-10-CM | POA: Diagnosis not present

## 2022-03-12 DIAGNOSIS — K219 Gastro-esophageal reflux disease without esophagitis: Secondary | ICD-10-CM | POA: Diagnosis not present

## 2022-03-12 DIAGNOSIS — Z96612 Presence of left artificial shoulder joint: Secondary | ICD-10-CM | POA: Diagnosis not present

## 2022-03-12 DIAGNOSIS — M810 Age-related osteoporosis without current pathological fracture: Secondary | ICD-10-CM | POA: Diagnosis not present

## 2022-03-12 DIAGNOSIS — H40053 Ocular hypertension, bilateral: Secondary | ICD-10-CM | POA: Diagnosis not present

## 2022-03-12 DIAGNOSIS — M8580 Other specified disorders of bone density and structure, unspecified site: Secondary | ICD-10-CM | POA: Diagnosis not present

## 2022-03-12 DIAGNOSIS — K5732 Diverticulitis of large intestine without perforation or abscess without bleeding: Secondary | ICD-10-CM | POA: Diagnosis not present

## 2022-03-12 DIAGNOSIS — Z7984 Long term (current) use of oral hypoglycemic drugs: Secondary | ICD-10-CM | POA: Diagnosis not present

## 2022-03-12 DIAGNOSIS — Z471 Aftercare following joint replacement surgery: Secondary | ICD-10-CM | POA: Diagnosis not present

## 2022-03-12 DIAGNOSIS — K2270 Barrett's esophagus without dysplasia: Secondary | ICD-10-CM | POA: Diagnosis not present

## 2022-03-12 DIAGNOSIS — D649 Anemia, unspecified: Secondary | ICD-10-CM | POA: Diagnosis not present

## 2022-03-12 NOTE — Telephone Encounter (Signed)
Home Health Verbal Orders - Caller/Agency:[facility] Callback Number: [phone]/UDT leave message Requesting PT Frequency: 2x1 1x2

## 2022-03-13 NOTE — Telephone Encounter (Signed)
Spoke to Mickel Baas gave her verbal orders. She verbalized understanding.  KP

## 2022-03-14 DIAGNOSIS — Z471 Aftercare following joint replacement surgery: Secondary | ICD-10-CM | POA: Diagnosis not present

## 2022-03-14 DIAGNOSIS — Z96612 Presence of left artificial shoulder joint: Secondary | ICD-10-CM | POA: Diagnosis not present

## 2022-03-14 DIAGNOSIS — M8580 Other specified disorders of bone density and structure, unspecified site: Secondary | ICD-10-CM | POA: Diagnosis not present

## 2022-03-14 DIAGNOSIS — E119 Type 2 diabetes mellitus without complications: Secondary | ICD-10-CM | POA: Diagnosis not present

## 2022-03-14 DIAGNOSIS — M810 Age-related osteoporosis without current pathological fracture: Secondary | ICD-10-CM | POA: Diagnosis not present

## 2022-03-14 DIAGNOSIS — I10 Essential (primary) hypertension: Secondary | ICD-10-CM | POA: Diagnosis not present

## 2022-03-15 DIAGNOSIS — Z471 Aftercare following joint replacement surgery: Secondary | ICD-10-CM | POA: Diagnosis not present

## 2022-03-15 DIAGNOSIS — M810 Age-related osteoporosis without current pathological fracture: Secondary | ICD-10-CM | POA: Diagnosis not present

## 2022-03-15 DIAGNOSIS — I10 Essential (primary) hypertension: Secondary | ICD-10-CM | POA: Diagnosis not present

## 2022-03-15 DIAGNOSIS — E119 Type 2 diabetes mellitus without complications: Secondary | ICD-10-CM | POA: Diagnosis not present

## 2022-03-15 DIAGNOSIS — Z96612 Presence of left artificial shoulder joint: Secondary | ICD-10-CM | POA: Diagnosis not present

## 2022-03-15 DIAGNOSIS — M8580 Other specified disorders of bone density and structure, unspecified site: Secondary | ICD-10-CM | POA: Diagnosis not present

## 2022-03-18 DIAGNOSIS — Z96612 Presence of left artificial shoulder joint: Secondary | ICD-10-CM | POA: Diagnosis not present

## 2022-03-18 DIAGNOSIS — E119 Type 2 diabetes mellitus without complications: Secondary | ICD-10-CM | POA: Diagnosis not present

## 2022-03-18 DIAGNOSIS — M8580 Other specified disorders of bone density and structure, unspecified site: Secondary | ICD-10-CM | POA: Diagnosis not present

## 2022-03-18 DIAGNOSIS — M810 Age-related osteoporosis without current pathological fracture: Secondary | ICD-10-CM | POA: Diagnosis not present

## 2022-03-18 DIAGNOSIS — Z471 Aftercare following joint replacement surgery: Secondary | ICD-10-CM | POA: Diagnosis not present

## 2022-03-18 DIAGNOSIS — I10 Essential (primary) hypertension: Secondary | ICD-10-CM | POA: Diagnosis not present

## 2022-03-19 DIAGNOSIS — Z96612 Presence of left artificial shoulder joint: Secondary | ICD-10-CM | POA: Diagnosis not present

## 2022-03-19 DIAGNOSIS — M8580 Other specified disorders of bone density and structure, unspecified site: Secondary | ICD-10-CM | POA: Diagnosis not present

## 2022-03-19 DIAGNOSIS — E119 Type 2 diabetes mellitus without complications: Secondary | ICD-10-CM | POA: Diagnosis not present

## 2022-03-19 DIAGNOSIS — M810 Age-related osteoporosis without current pathological fracture: Secondary | ICD-10-CM | POA: Diagnosis not present

## 2022-03-19 DIAGNOSIS — I10 Essential (primary) hypertension: Secondary | ICD-10-CM | POA: Diagnosis not present

## 2022-03-19 DIAGNOSIS — Z471 Aftercare following joint replacement surgery: Secondary | ICD-10-CM | POA: Diagnosis not present

## 2022-03-20 DIAGNOSIS — I10 Essential (primary) hypertension: Secondary | ICD-10-CM | POA: Diagnosis not present

## 2022-03-20 DIAGNOSIS — M8580 Other specified disorders of bone density and structure, unspecified site: Secondary | ICD-10-CM | POA: Diagnosis not present

## 2022-03-20 DIAGNOSIS — Z471 Aftercare following joint replacement surgery: Secondary | ICD-10-CM | POA: Diagnosis not present

## 2022-03-20 DIAGNOSIS — M810 Age-related osteoporosis without current pathological fracture: Secondary | ICD-10-CM | POA: Diagnosis not present

## 2022-03-20 DIAGNOSIS — Z96612 Presence of left artificial shoulder joint: Secondary | ICD-10-CM | POA: Diagnosis not present

## 2022-03-20 DIAGNOSIS — E119 Type 2 diabetes mellitus without complications: Secondary | ICD-10-CM | POA: Diagnosis not present

## 2022-03-21 DIAGNOSIS — I10 Essential (primary) hypertension: Secondary | ICD-10-CM | POA: Diagnosis not present

## 2022-03-21 DIAGNOSIS — M810 Age-related osteoporosis without current pathological fracture: Secondary | ICD-10-CM | POA: Diagnosis not present

## 2022-03-21 DIAGNOSIS — E119 Type 2 diabetes mellitus without complications: Secondary | ICD-10-CM | POA: Diagnosis not present

## 2022-03-21 DIAGNOSIS — Z96612 Presence of left artificial shoulder joint: Secondary | ICD-10-CM | POA: Diagnosis not present

## 2022-03-21 DIAGNOSIS — M8580 Other specified disorders of bone density and structure, unspecified site: Secondary | ICD-10-CM | POA: Diagnosis not present

## 2022-03-21 DIAGNOSIS — Z471 Aftercare following joint replacement surgery: Secondary | ICD-10-CM | POA: Diagnosis not present

## 2022-03-25 DIAGNOSIS — E119 Type 2 diabetes mellitus without complications: Secondary | ICD-10-CM | POA: Diagnosis not present

## 2022-03-25 DIAGNOSIS — I10 Essential (primary) hypertension: Secondary | ICD-10-CM | POA: Diagnosis not present

## 2022-03-25 DIAGNOSIS — Z471 Aftercare following joint replacement surgery: Secondary | ICD-10-CM | POA: Diagnosis not present

## 2022-03-25 DIAGNOSIS — M810 Age-related osteoporosis without current pathological fracture: Secondary | ICD-10-CM | POA: Diagnosis not present

## 2022-03-25 DIAGNOSIS — M8580 Other specified disorders of bone density and structure, unspecified site: Secondary | ICD-10-CM | POA: Diagnosis not present

## 2022-03-25 DIAGNOSIS — Z96612 Presence of left artificial shoulder joint: Secondary | ICD-10-CM | POA: Diagnosis not present

## 2022-03-27 DIAGNOSIS — M810 Age-related osteoporosis without current pathological fracture: Secondary | ICD-10-CM | POA: Diagnosis not present

## 2022-03-27 DIAGNOSIS — Z96612 Presence of left artificial shoulder joint: Secondary | ICD-10-CM | POA: Diagnosis not present

## 2022-03-27 DIAGNOSIS — M8580 Other specified disorders of bone density and structure, unspecified site: Secondary | ICD-10-CM | POA: Diagnosis not present

## 2022-03-27 DIAGNOSIS — Z471 Aftercare following joint replacement surgery: Secondary | ICD-10-CM | POA: Diagnosis not present

## 2022-03-27 DIAGNOSIS — E119 Type 2 diabetes mellitus without complications: Secondary | ICD-10-CM | POA: Diagnosis not present

## 2022-03-27 DIAGNOSIS — I10 Essential (primary) hypertension: Secondary | ICD-10-CM | POA: Diagnosis not present

## 2022-03-29 DIAGNOSIS — M8580 Other specified disorders of bone density and structure, unspecified site: Secondary | ICD-10-CM | POA: Diagnosis not present

## 2022-03-29 DIAGNOSIS — Z471 Aftercare following joint replacement surgery: Secondary | ICD-10-CM | POA: Diagnosis not present

## 2022-03-29 DIAGNOSIS — I10 Essential (primary) hypertension: Secondary | ICD-10-CM | POA: Diagnosis not present

## 2022-03-29 DIAGNOSIS — M810 Age-related osteoporosis without current pathological fracture: Secondary | ICD-10-CM | POA: Diagnosis not present

## 2022-03-29 DIAGNOSIS — E119 Type 2 diabetes mellitus without complications: Secondary | ICD-10-CM | POA: Diagnosis not present

## 2022-03-29 DIAGNOSIS — Z96612 Presence of left artificial shoulder joint: Secondary | ICD-10-CM | POA: Diagnosis not present

## 2022-04-01 DIAGNOSIS — M8580 Other specified disorders of bone density and structure, unspecified site: Secondary | ICD-10-CM | POA: Diagnosis not present

## 2022-04-01 DIAGNOSIS — E119 Type 2 diabetes mellitus without complications: Secondary | ICD-10-CM | POA: Diagnosis not present

## 2022-04-01 DIAGNOSIS — Z471 Aftercare following joint replacement surgery: Secondary | ICD-10-CM | POA: Diagnosis not present

## 2022-04-01 DIAGNOSIS — Z96612 Presence of left artificial shoulder joint: Secondary | ICD-10-CM | POA: Diagnosis not present

## 2022-04-01 DIAGNOSIS — I10 Essential (primary) hypertension: Secondary | ICD-10-CM | POA: Diagnosis not present

## 2022-04-01 DIAGNOSIS — M810 Age-related osteoporosis without current pathological fracture: Secondary | ICD-10-CM | POA: Diagnosis not present

## 2022-04-03 ENCOUNTER — Other Ambulatory Visit (INDEPENDENT_AMBULATORY_CARE_PROVIDER_SITE_OTHER): Payer: Medicare Other | Admitting: Internal Medicine

## 2022-04-03 DIAGNOSIS — K2100 Gastro-esophageal reflux disease with esophagitis, without bleeding: Secondary | ICD-10-CM

## 2022-04-03 DIAGNOSIS — Z96612 Presence of left artificial shoulder joint: Secondary | ICD-10-CM

## 2022-04-03 DIAGNOSIS — K2270 Barrett's esophagus without dysplasia: Secondary | ICD-10-CM

## 2022-04-03 DIAGNOSIS — E119 Type 2 diabetes mellitus without complications: Secondary | ICD-10-CM | POA: Diagnosis not present

## 2022-04-03 DIAGNOSIS — M810 Age-related osteoporosis without current pathological fracture: Secondary | ICD-10-CM | POA: Diagnosis not present

## 2022-04-03 DIAGNOSIS — Z90710 Acquired absence of both cervix and uterus: Secondary | ICD-10-CM

## 2022-04-03 DIAGNOSIS — K5732 Diverticulitis of large intestine without perforation or abscess without bleeding: Secondary | ICD-10-CM

## 2022-04-03 DIAGNOSIS — M8580 Other specified disorders of bone density and structure, unspecified site: Secondary | ICD-10-CM | POA: Diagnosis not present

## 2022-04-03 DIAGNOSIS — E785 Hyperlipidemia, unspecified: Secondary | ICD-10-CM

## 2022-04-03 DIAGNOSIS — Z471 Aftercare following joint replacement surgery: Secondary | ICD-10-CM | POA: Diagnosis not present

## 2022-04-03 DIAGNOSIS — I10 Essential (primary) hypertension: Secondary | ICD-10-CM | POA: Diagnosis not present

## 2022-04-03 DIAGNOSIS — K449 Diaphragmatic hernia without obstruction or gangrene: Secondary | ICD-10-CM

## 2022-04-03 DIAGNOSIS — E118 Type 2 diabetes mellitus with unspecified complications: Secondary | ICD-10-CM

## 2022-04-03 DIAGNOSIS — E1169 Type 2 diabetes mellitus with other specified complication: Secondary | ICD-10-CM

## 2022-04-03 NOTE — Progress Notes (Signed)
Received home health orders orders from [facility]. Start of care 03/12/22.   Certification and orders from 03/12/22 through 05/10/22 are reviewed, signed and faxed back to home health company.  Need of intermittent skilled services at home: home bound  The home health care plan has been established by me and will be reviewed and updated as needed to maximize patient recovery.  I certify that all home health services have been and will be furnished to the patient while under my care.  Face-to-face encounter in which the need for home health services was established: 12/14/21  Patient is receiving home health services for the following diagnoses: Problem List Items Addressed This Visit       Cardiovascular and Mediastinum   Essential (primary) hypertension (Chronic)     Digestive   Barrett's esophagus without dysplasia (Chronic)     Endocrine   Hyperlipidemia associated with type 2 diabetes mellitus ([place]) (Chronic)   Type II diabetes mellitus with complication (HCC) (Chronic)     Musculoskeletal and Integument   OP (osteoporosis)   Other Visit Diagnoses     Aftercare following left shoulder joint replacement surgery    -  Primary   Presence of left artificial shoulder joint       Hiatal hernia       Gastroesophageal reflux disease with esophagitis without hemorrhage       Diverticulitis of colon (without mention of hemorrhage)(562.11)       Acquired absence of both cervix and uterus            Halina Maidens, MD

## 2022-04-04 DIAGNOSIS — M87012 Idiopathic aseptic necrosis of left shoulder: Secondary | ICD-10-CM | POA: Diagnosis not present

## 2022-04-05 DIAGNOSIS — Z96612 Presence of left artificial shoulder joint: Secondary | ICD-10-CM | POA: Diagnosis not present

## 2022-04-05 DIAGNOSIS — E119 Type 2 diabetes mellitus without complications: Secondary | ICD-10-CM | POA: Diagnosis not present

## 2022-04-05 DIAGNOSIS — Z471 Aftercare following joint replacement surgery: Secondary | ICD-10-CM | POA: Diagnosis not present

## 2022-04-05 DIAGNOSIS — M8580 Other specified disorders of bone density and structure, unspecified site: Secondary | ICD-10-CM | POA: Diagnosis not present

## 2022-04-05 DIAGNOSIS — I10 Essential (primary) hypertension: Secondary | ICD-10-CM | POA: Diagnosis not present

## 2022-04-05 DIAGNOSIS — M810 Age-related osteoporosis without current pathological fracture: Secondary | ICD-10-CM | POA: Diagnosis not present

## 2022-04-08 DIAGNOSIS — E119 Type 2 diabetes mellitus without complications: Secondary | ICD-10-CM | POA: Diagnosis not present

## 2022-04-08 DIAGNOSIS — Z471 Aftercare following joint replacement surgery: Secondary | ICD-10-CM | POA: Diagnosis not present

## 2022-04-08 DIAGNOSIS — Z96612 Presence of left artificial shoulder joint: Secondary | ICD-10-CM | POA: Diagnosis not present

## 2022-04-08 DIAGNOSIS — M810 Age-related osteoporosis without current pathological fracture: Secondary | ICD-10-CM | POA: Diagnosis not present

## 2022-04-08 DIAGNOSIS — M8580 Other specified disorders of bone density and structure, unspecified site: Secondary | ICD-10-CM | POA: Diagnosis not present

## 2022-04-08 DIAGNOSIS — I10 Essential (primary) hypertension: Secondary | ICD-10-CM | POA: Diagnosis not present

## 2022-04-10 DIAGNOSIS — Z471 Aftercare following joint replacement surgery: Secondary | ICD-10-CM | POA: Diagnosis not present

## 2022-04-10 DIAGNOSIS — M810 Age-related osteoporosis without current pathological fracture: Secondary | ICD-10-CM | POA: Diagnosis not present

## 2022-04-10 DIAGNOSIS — I10 Essential (primary) hypertension: Secondary | ICD-10-CM | POA: Diagnosis not present

## 2022-04-10 DIAGNOSIS — M8580 Other specified disorders of bone density and structure, unspecified site: Secondary | ICD-10-CM | POA: Diagnosis not present

## 2022-04-10 DIAGNOSIS — Z96612 Presence of left artificial shoulder joint: Secondary | ICD-10-CM | POA: Diagnosis not present

## 2022-04-10 DIAGNOSIS — E119 Type 2 diabetes mellitus without complications: Secondary | ICD-10-CM | POA: Diagnosis not present

## 2022-04-11 DIAGNOSIS — Z90710 Acquired absence of both cervix and uterus: Secondary | ICD-10-CM | POA: Diagnosis not present

## 2022-04-11 DIAGNOSIS — Z7984 Long term (current) use of oral hypoglycemic drugs: Secondary | ICD-10-CM | POA: Diagnosis not present

## 2022-04-11 DIAGNOSIS — K2270 Barrett's esophagus without dysplasia: Secondary | ICD-10-CM | POA: Diagnosis not present

## 2022-04-11 DIAGNOSIS — I10 Essential (primary) hypertension: Secondary | ICD-10-CM | POA: Diagnosis not present

## 2022-04-11 DIAGNOSIS — Z471 Aftercare following joint replacement surgery: Secondary | ICD-10-CM | POA: Diagnosis not present

## 2022-04-11 DIAGNOSIS — K5732 Diverticulitis of large intestine without perforation or abscess without bleeding: Secondary | ICD-10-CM | POA: Diagnosis not present

## 2022-04-11 DIAGNOSIS — H40053 Ocular hypertension, bilateral: Secondary | ICD-10-CM | POA: Diagnosis not present

## 2022-04-11 DIAGNOSIS — M810 Age-related osteoporosis without current pathological fracture: Secondary | ICD-10-CM | POA: Diagnosis not present

## 2022-04-11 DIAGNOSIS — E119 Type 2 diabetes mellitus without complications: Secondary | ICD-10-CM | POA: Diagnosis not present

## 2022-04-11 DIAGNOSIS — M8580 Other specified disorders of bone density and structure, unspecified site: Secondary | ICD-10-CM | POA: Diagnosis not present

## 2022-04-11 DIAGNOSIS — K449 Diaphragmatic hernia without obstruction or gangrene: Secondary | ICD-10-CM | POA: Diagnosis not present

## 2022-04-11 DIAGNOSIS — Z9049 Acquired absence of other specified parts of digestive tract: Secondary | ICD-10-CM | POA: Diagnosis not present

## 2022-04-11 DIAGNOSIS — Z96612 Presence of left artificial shoulder joint: Secondary | ICD-10-CM | POA: Diagnosis not present

## 2022-04-11 DIAGNOSIS — E7800 Pure hypercholesterolemia, unspecified: Secondary | ICD-10-CM | POA: Diagnosis not present

## 2022-04-11 DIAGNOSIS — K219 Gastro-esophageal reflux disease without esophagitis: Secondary | ICD-10-CM | POA: Diagnosis not present

## 2022-04-11 DIAGNOSIS — D649 Anemia, unspecified: Secondary | ICD-10-CM | POA: Diagnosis not present

## 2022-04-15 ENCOUNTER — Ambulatory Visit (INDEPENDENT_AMBULATORY_CARE_PROVIDER_SITE_OTHER): Payer: Medicare Other | Admitting: Podiatry

## 2022-04-15 ENCOUNTER — Encounter: Payer: Self-pay | Admitting: Podiatry

## 2022-04-15 DIAGNOSIS — M79674 Pain in right toe(s): Secondary | ICD-10-CM | POA: Diagnosis not present

## 2022-04-15 DIAGNOSIS — B351 Tinea unguium: Secondary | ICD-10-CM | POA: Diagnosis not present

## 2022-04-15 DIAGNOSIS — E118 Type 2 diabetes mellitus with unspecified complications: Secondary | ICD-10-CM

## 2022-04-15 DIAGNOSIS — M79675 Pain in left toe(s): Secondary | ICD-10-CM

## 2022-04-15 LAB — HM DIABETES FOOT EXAM: HM Diabetic Foot Exam: NORMAL

## 2022-04-15 NOTE — Progress Notes (Signed)
This patient returns to my office for at risk foot care.  This patient requires this care by a professional since this patient will be at risk due to having diabetes with no complications.  This patient is unable to cut nails herself since the patient cannot reach her nails.These nails are painful walking and wearing shoes.  This patient presents for at risk foot care today.  General Appearance  Alert, conversant and in no acute stress.  Vascular  Dorsalis pedis and posterior tibial  pulses are palpable  bilaterally.  Capillary return is within normal limits  bilaterally. Temperature is within normal limits  bilaterally.  Neurologic  Senn-Weinstein monofilament wire test within normal limits  bilaterally. Muscle power within normal limits bilaterally.  Nails Thick disfigured discolored nails with subungual debris  hallux nails  bilaterally. No evidence of bacterial infection or drainage bilaterally.  Orthopedic  No limitations of motion  feet .  No crepitus or effusions noted.  No bony pathology or digital deformities noted. Contracted fifth toe left foot.  Skin  normotropic skin with no porokeratosis noted bilaterally.  No signs of infections or ulcers noted.     Onychomycosis  Pain in right toes  Pain in left toes  Consent was obtained for treatment procedures.   Mechanical debridement of nails 1-5  bilaterally performed with a nail nipper.  Filed with dremel without incident.    Return office visit    4 months                 Told patient to return for periodic foot care and evaluation due to potential at risk complications.   Yissel Habermehl DPM   

## 2022-04-18 DIAGNOSIS — I10 Essential (primary) hypertension: Secondary | ICD-10-CM | POA: Diagnosis not present

## 2022-04-18 DIAGNOSIS — Z96612 Presence of left artificial shoulder joint: Secondary | ICD-10-CM | POA: Diagnosis not present

## 2022-04-18 DIAGNOSIS — Z471 Aftercare following joint replacement surgery: Secondary | ICD-10-CM | POA: Diagnosis not present

## 2022-04-18 DIAGNOSIS — M810 Age-related osteoporosis without current pathological fracture: Secondary | ICD-10-CM | POA: Diagnosis not present

## 2022-04-18 DIAGNOSIS — M8580 Other specified disorders of bone density and structure, unspecified site: Secondary | ICD-10-CM | POA: Diagnosis not present

## 2022-04-18 DIAGNOSIS — E119 Type 2 diabetes mellitus without complications: Secondary | ICD-10-CM | POA: Diagnosis not present

## 2022-04-22 ENCOUNTER — Encounter: Payer: Self-pay | Admitting: Internal Medicine

## 2022-04-22 ENCOUNTER — Ambulatory Visit (INDEPENDENT_AMBULATORY_CARE_PROVIDER_SITE_OTHER): Payer: Medicare Other | Admitting: Internal Medicine

## 2022-04-22 VITALS — BP 132/70 | HR 70 | Ht 61.0 in | Wt 126.2 lb

## 2022-04-22 DIAGNOSIS — E1169 Type 2 diabetes mellitus with other specified complication: Secondary | ICD-10-CM

## 2022-04-22 DIAGNOSIS — E118 Type 2 diabetes mellitus with unspecified complications: Secondary | ICD-10-CM

## 2022-04-22 DIAGNOSIS — E785 Hyperlipidemia, unspecified: Secondary | ICD-10-CM

## 2022-04-22 DIAGNOSIS — L03114 Cellulitis of left upper limb: Secondary | ICD-10-CM | POA: Diagnosis not present

## 2022-04-22 DIAGNOSIS — I10 Essential (primary) hypertension: Secondary | ICD-10-CM

## 2022-04-22 LAB — POCT GLYCOSYLATED HEMOGLOBIN (HGB A1C): Hemoglobin A1C: 5.9 % — AB (ref 4.0–5.6)

## 2022-04-22 MED ORDER — PREDNISONE 10 MG PO TABS: 10.0000 mg | ORAL_TABLET | ORAL | 0 refills | Status: AC

## 2022-04-22 NOTE — Progress Notes (Signed)
Date:  04/22/2022   Name:  Lori Harrell   DOB:  [birth year]   MRN:  [id number]   Chief Complaint: Diabetes and Hypertension  Hypertension This is a chronic problem. The problem is controlled. Pertinent negatives include no chest pain, headaches, palpitations or shortness of breath. Past treatments include diuretics and ACE inhibitors. The current treatment provides significant improvement. There is no history of kidney disease, CAD/MI or CVA.  Diabetes She presents for her follow-up diabetic visit. She has type 2 diabetes mellitus. Her disease course has been stable. Pertinent negatives for hypoglycemia include no headaches or tremors. Pertinent negatives for diabetes include no chest pain, no fatigue, no polydipsia, no polyuria and no weight loss. Symptoms are stable. Pertinent negatives for diabetic complications include no CVA. Current diabetic treatment includes oral agent (monotherapy) (metformin). Her weight is stable.  Hyperlipidemia This is a chronic problem. Pertinent negatives include no chest pain or shortness of breath. Current antihyperlipidemic treatment includes statins.  Rash This is a new problem. The current episode started in the past 7 days. The problem is unchanged. The affected locations include the left arm, left hand and neck. The rash is characterized by redness, itchiness and swelling. She was exposed to an insect bite/sting. Pertinent negatives include no congestion, cough, fatigue, fever or shortness of breath.    Lab Results  Component Value Date   NA 136 02/06/2022   K 4.3 02/06/2022   CO2 25 02/06/2022   GLUCOSE 96 02/06/2022   BUN 12 02/06/2022   CREATININE 0.65 02/06/2022   CALCIUM 9.5 02/06/2022   EGFR 77 12/14/2021   GFRNONAA >60 02/06/2022   Lab Results  Component Value Date   CHOL 159 12/14/2021   HDL 52 12/14/2021   LDLCALC 91 12/14/2021   TRIG 87 12/14/2021   CHOLHDL 3.1 12/14/2021   Lab Results  Component Value Date   TSH 5.650  (H) 12/14/2021   Lab Results  Component Value Date   HGBA1C 5.9 (A) 04/22/2022   Lab Results  Component Value Date   WBC 6.1 02/06/2022   HGB 12.2 02/06/2022   HCT 37.6 02/06/2022   MCV 93.3 02/06/2022   PLT 210 02/06/2022   Lab Results  Component Value Date   ALT 20 02/06/2022   AST 23 02/06/2022   ALKPHOS 54 02/06/2022   BILITOT 0.7 02/06/2022   Lab Results  Component Value Date   VD25OH 61.0 12/14/2021     Review of Systems  Constitutional:  Negative for appetite change, fatigue, fever, unexpected weight change and weight loss.  HENT:  Negative for congestion, facial swelling, tinnitus and trouble swallowing.   Eyes:  Negative for visual disturbance.  Respiratory:  Negative for cough, chest tightness, shortness of breath and wheezing.   Cardiovascular:  Negative for chest pain, palpitations and leg swelling.  Gastrointestinal:  Negative for abdominal pain.  Endocrine: Negative for polydipsia and polyuria.  Genitourinary:  Negative for dysuria and hematuria.  Musculoskeletal:  Positive for arthralgias (left shoulder surgery - doing PT now).  Skin:  Positive for rash.  Neurological:  Negative for tremors, numbness and headaches.  Psychiatric/Behavioral:  Negative for dysphoric mood.     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   Basal cell carcinoma (BCC) of skin of lip 05/31/2021   Elevated TSH 12/15/2020   S/P lumbar fusion 05/19/2019   Red blood cell antibody positive 02/03/2019   DDD (degenerative disc disease), lumbar 01/07/2019  Carotid artery stenosis, asymptomatic, bilateral 07/07/2017   Primary open angle glaucoma 05/08/2017   Old partial retinal detachment 05/08/2017   Large hiatal hernia 07/04/2016   Sigmoid diverticulitis 07/04/2016   Stress incontinence 07/04/2016   Type II diabetes mellitus with complication (HCC) 10/31/2015   Constipation 10/31/2015   Barrett's esophagus without  dysplasia 06/16/2015   Essential (primary) hypertension 06/16/2015   Hyperlipidemia associated with type 2 diabetes mellitus (HCC) 06/16/2015   OP (osteoporosis) 06/16/2015   Hypercalcemia 04/22/2012    No Known Allergies  Past Surgical History:  Procedure Laterality Date   ANTERIOR LATERAL LUMBAR FUSION WITH PERCUTANEOUS SCREW 2 LEVEL N/A 05/19/2019   Procedure: L2-4 LATERAL LUMBAR INTERBODY FUSION (XLIF);  Surgeon: Venetia Night, MD;  Location: [facility] ORS;  Service: Neurosurgery;  Laterality: N/A;   CATARACT EXTRACTION Bilateral    CHOLECYSTECTOMY, LAPAROSCOPIC  02/05/2019   [facility]   COLONOSCOPY  2007   sigmoid diverticulosis   ENDOSCOPIC RETROGRADE CHOLANGIOPANCREATOGRAPHY (ERCP) WITH PROPOFOL N/A 02/02/2019   Procedure: ENDOSCOPIC RETROGRADE CHOLANGIOPANCREATOGRAPHY (ERCP) WITH PROPOFOL;  Surgeon: Midge Minium, MD;  Location: [facility] ENDOSCOPY;  Service: Endoscopy;  Laterality: N/A;   ESOPHAGOGASTRODUODENOSCOPY  04/22/2016   ESOPHAGOGASTRODUODENOSCOPY  04/26/2019   ESOPHAGOGASTRODUODENOSCOPY  09/15/2019   REVERSE SHOULDER ARTHROPLASTY Left 02/18/2022   Procedure: Left reverse shoulder arthroplasty, biceps tenodesis;  Surgeon: Signa Kell, MD;  Location: [facility] ORS;  Service: Orthopedics;  Laterality: Left;   TONSILLECTOMY     UPPER GASTROINTESTINAL ENDOSCOPY  02/2013   Barrett's - done at [facility]   VAGINAL HYSTERECTOMY      Social History   Tobacco Use   Smoking status: Never   Smokeless tobacco: Never   Tobacco comments:    smoking cessation materials not required  Vaping Use   Vaping Use: Never used  Substance Use Topics   Alcohol use: No    Alcohol/week: 0.0 standard drinks of alcohol   Drug use: No     Medication list has been reviewed and updated.  Current Meds  Medication Sig   acetaminophen (TYLENOL) 500 MG tablet Take 1,000 mg by mouth daily as needed (back pain.).   atorvastatin (LIPITOR) 20 MG tablet TAKE 1 TABLET DAILY (Patient taking differently: Take  20 mg by mouth every evening.)   Calcium-Magnesium-Vitamin D (CALCIUM 1200+D3 PO) Take 1 tablet by mouth every evening.   dorzolamide-timolol (COSOPT) 22.3-6.8 MG/ML ophthalmic solution Place 1 drop into both eyes 2 (two) times daily.   enalapril (VASOTEC) 20 MG tablet TAKE 1 TABLET DAILY   esomeprazole (NEXIUM) 40 MG capsule Take 1 capsule (40 mg total) by mouth 2 (two) times daily.   FREESTYLE LITE test strip USE TO TEST BLOOD SUGAR DAILY   Lancets (FREESTYLE) lancets USE 1 LANCET DAILY   latanoprost (XALATAN) 0.005 % ophthalmic solution Place 1 drop into both eyes at bedtime.   Menthol, Topical Analgesic, (BIOFREEZE) 10 % LIQD Apply 1 application. topically 3 (three) times daily as needed (pain.).   metFORMIN (GLUCOPHAGE) 500 MG tablet TAKE 1 TABLET DAILY   Multiple Vitamin (MULTIVITAMIN WITH MINERALS) TABS tablet Take 1 tablet by mouth in the morning. Women's Centrum Silver   predniSONE (DELTASONE) 10 MG tablet Take 1 tablet (10 mg total) by mouth as directed for 6 days. Take 6,5,4,3,2,1 then stop   spironolactone (ALDACTONE) 25 MG tablet TAKE 1 TABLET DAILY       04/22/2022    9:44 AM 12/14/2021    9:23 AM 05/31/2021    8:47 AM 12/12/2020    8:35 AM  GAD 7 : Generalized Anxiety Score  Nervous, Anxious, on Edge 0 0 0 0  Control/stop worrying 0 0 0 0  Worry too much - different things 0 0 0 0  Trouble relaxing 0 0 0 0  Restless 0 0 0 0  Easily annoyed or irritable 0 0 0 0  Afraid - awful might happen 0 0 0 0  Total GAD 7 Score 0 0 0 0  Anxiety Difficulty Not difficult at all Not difficult at all  Not difficult at all       04/22/2022    9:44 AM 12/14/2021    9:23 AM 07/18/2021    8:48 AM  Depression screen PHQ 2/9  Decreased Interest 0 0 0  Down, Depressed, Hopeless 0 0 0  PHQ - 2 Score 0 0 0  Altered sleeping $RemoveBeforeDE'1 1 1  'IYhZglCQijbBCsB$ Tired, decreased energy 1 0 1  Change in appetite 0 0 0  Feeling bad or failure about yourself  0 0 0  Trouble concentrating 0 0 0  Moving slowly or  fidgety/restless 0 0 0  Suicidal thoughts 0 0 0  PHQ-9 Score $RemoveBef'2 1 2  'KWmQNoUZLj$ Difficult doing work/chores Not difficult at all Not difficult at all Not difficult at all    BP Readings from Last 3 Encounters:  04/22/22 132/70  02/21/22 126/80  02/06/22 (!) 159/69    Physical Exam Vitals and nursing note reviewed.  Constitutional:      General: She is not in acute distress.    Appearance: She is well-developed.  HENT:     Head: Normocephalic and atraumatic.  Cardiovascular:     Rate and Rhythm: Normal rate and regular rhythm.  Pulmonary:     Effort: Pulmonary effort is normal. No respiratory distress.     Breath sounds: No wheezing or rhonchi.  Musculoskeletal:     Cervical back: Normal range of motion.     Right lower leg: No edema.     Left lower leg: No edema.  Skin:    General: Skin is warm and dry.     Findings: Erythema (forearm and hand on left; right clavicular area) present. No rash.  Neurological:     Mental Status: She is alert and oriented to person, place, and time.  Psychiatric:        Mood and Affect: Mood normal.        Behavior: Behavior normal.     Wt Readings from Last 3 Encounters:  04/22/22 126 lb 3.2 oz (57.2 kg)  02/18/22 131 lb (59.4 kg)  02/06/22 131 lb (59.4 kg)    BP 132/70   Pulse 70   Ht $R'5\' 1"'Md$  (1.549 m)   Wt 126 lb 3.2 oz (57.2 kg)   SpO2 96%   BMI 23.85 kg/m   Assessment and Plan: 1. Essential (primary) hypertension Clinically stable exam with well controlled BP. Tolerating medications without side effects at this time. Pt to continue current regimen and low sodium diet; benefits of regular exercise as able discussed.  2. Type II diabetes mellitus with complication ([place]) Clinically stable by exam and report without s/s of hypoglycemia. DM complicated by hypertension and dyslipidemia. Tolerating medications well without side effects or other concerns. Foot exam normal at podiatry - POCT glycosylated hemoglobin (Hb A1C)= 5.9 up from  5.8  3. Hyperlipidemia associated with type 2 diabetes mellitus ([place]) controlled  4. Cellulitis of left upper extremity Due to recent insect stings - will treat the inflammation - no evidence of bacterial infection -  predniSONE (DELTASONE) 10 MG tablet; Take 1 tablet (10 mg total) by mouth as directed for 6 days. Take 6,5,4,3,2,1 then stop  Dispense: 21 tablet; Refill: 0   Partially dictated using Editor, commissioning. Any errors are unintentional.  Halina Maidens, MD [facility]  04/22/2022

## 2022-04-26 DIAGNOSIS — E119 Type 2 diabetes mellitus without complications: Secondary | ICD-10-CM | POA: Diagnosis not present

## 2022-04-26 DIAGNOSIS — M8580 Other specified disorders of bone density and structure, unspecified site: Secondary | ICD-10-CM | POA: Diagnosis not present

## 2022-04-26 DIAGNOSIS — Z96612 Presence of left artificial shoulder joint: Secondary | ICD-10-CM | POA: Diagnosis not present

## 2022-04-26 DIAGNOSIS — I10 Essential (primary) hypertension: Secondary | ICD-10-CM | POA: Diagnosis not present

## 2022-04-26 DIAGNOSIS — M810 Age-related osteoporosis without current pathological fracture: Secondary | ICD-10-CM | POA: Diagnosis not present

## 2022-04-26 DIAGNOSIS — Z471 Aftercare following joint replacement surgery: Secondary | ICD-10-CM | POA: Diagnosis not present

## 2022-04-29 DIAGNOSIS — I10 Essential (primary) hypertension: Secondary | ICD-10-CM | POA: Diagnosis not present

## 2022-04-29 DIAGNOSIS — E119 Type 2 diabetes mellitus without complications: Secondary | ICD-10-CM | POA: Diagnosis not present

## 2022-04-29 DIAGNOSIS — Z471 Aftercare following joint replacement surgery: Secondary | ICD-10-CM | POA: Diagnosis not present

## 2022-04-29 DIAGNOSIS — Z96612 Presence of left artificial shoulder joint: Secondary | ICD-10-CM | POA: Diagnosis not present

## 2022-04-29 DIAGNOSIS — M810 Age-related osteoporosis without current pathological fracture: Secondary | ICD-10-CM | POA: Diagnosis not present

## 2022-04-29 DIAGNOSIS — M8580 Other specified disorders of bone density and structure, unspecified site: Secondary | ICD-10-CM | POA: Diagnosis not present

## 2022-04-30 ENCOUNTER — Other Ambulatory Visit: Payer: Self-pay

## 2022-04-30 ENCOUNTER — Emergency Department
Admission: EM | Admit: 2022-04-30 | Discharge: 2022-04-30 | Disposition: A | Discharge status: Home / Self Care | Payer: Medicare Other | Attending: Emergency Medicine | Admitting: Emergency Medicine

## 2022-04-30 ENCOUNTER — Emergency Department: Payer: Medicare Other

## 2022-04-30 ENCOUNTER — Encounter: Payer: Self-pay | Admitting: Emergency Medicine

## 2022-04-30 DIAGNOSIS — Z471 Aftercare following joint replacement surgery: Secondary | ICD-10-CM | POA: Diagnosis not present

## 2022-04-30 DIAGNOSIS — E119 Type 2 diabetes mellitus without complications: Secondary | ICD-10-CM | POA: Diagnosis not present

## 2022-04-30 DIAGNOSIS — I10 Essential (primary) hypertension: Secondary | ICD-10-CM | POA: Diagnosis not present

## 2022-04-30 DIAGNOSIS — Z96612 Presence of left artificial shoulder joint: Secondary | ICD-10-CM | POA: Diagnosis not present

## 2022-04-30 DIAGNOSIS — M25512 Pain in left shoulder: Secondary | ICD-10-CM | POA: Diagnosis not present

## 2022-04-30 MED ORDER — TRAMADOL HCL 50 MG PO TABS
50.0000 mg | ORAL_TABLET | Freq: Once | ORAL | Status: AC
  Administered 2022-04-30: 50 mg via ORAL
  Filled 2022-04-30: qty 1

## 2022-04-30 MED ORDER — LIDOCAINE 5 % EX PTCH
1.0000 | MEDICATED_PATCH | Freq: Once | CUTANEOUS | Status: DC
  Administered 2022-04-30: 1 via TRANSDERMAL
  Filled 2022-04-30: qty 1

## 2022-04-30 MED ORDER — TRAMADOL HCL 50 MG PO TABS: 50.0000 mg | ORAL_TABLET | Freq: Two times a day (BID) | ORAL | 0 refills | Status: AC

## 2022-04-30 NOTE — ED Notes (Signed)
See triage note  States she developed left shoulder pain on Sunday  states no pain with rest but pain increases with movement  Has tried OTC med and ice and heat w/o relief

## 2022-04-30 NOTE — Discharge Instructions (Addendum)
Your exam and x-ray are overall reassuring.  Take the prescription Ultram as needed for pain relief.  Follow-up with Ortho for ongoing pain.

## 2022-04-30 NOTE — ED Provider Notes (Signed)
[facility] Emergency Department Provider Note     Event Date/Time   First MD Initiated Contact with Patient 04/30/22 1355     (approximate)   History   Shoulder Pain   HPI  Lori Harrell is a 81 y.o. female with a history of diabetes, hypertension, hyperlipidemia, and DDD, who presents to the ED almost 2 months status post reverse shoulder arthroplasty on the left.  Patient has been tolerating physical therapy without difficulty.  She denies any interim complaints.  She presents today however, with some acute left shoulder pain since Sunday afternoon.  Denies any recent injury, trauma, fall.  She denies any distal paresthesias, grip changes, or chest pain.  Patient also denies any fevers, chills, or sweats.  She is been taking Tylenol extra strength intermittently for pain relief.  Physical Exam   Triage Vital Signs: ED Triage Vitals  Enc Vitals Group     BP 04/30/22 1333 (!) 161/94     Pulse Rate 04/30/22 1333 78     Resp 04/30/22 1333 16     Temp 04/30/22 1333 98.1 F (36.7 C)     Temp Source 04/30/22 1333 Oral     SpO2 04/30/22 1333 100 %     Weight 04/30/22 1334 126 lb (57.2 kg)     Height 04/30/22 1334 '5\' 1"'$  (1.549 m)     Head Circumference --      Peak Flow --      Pain Score 04/30/22 1338 10     Pain Loc --      Pain Edu? --      Excl. in [place]? --     Most recent vital signs: Vitals:   04/30/22 1333  BP: (!) 161/94  Pulse: 78  Resp: 16  Temp: 98.1 F (36.7 C)  SpO2: 100%    General Awake, no distress.  CV:  Good peripheral perfusion.  RESP:  Normal effort.  ABD:  No distention.  MSK:  Shoulder without obvious deformity or dislocation.  Well-healed surgical scar scars noted.  Normal active range of motion appreciated.  Normal composite fist distally.   ED Results / Procedures / Treatments   Labs (all labs ordered are listed, but only abnormal results are displayed) Labs Reviewed - No data to  display   EKG   RADIOLOGY  I personally viewed and evaluated these images as part of my medical decision making, as well as reviewing the written report by the radiologist.  ED Provider Interpretation: no acute findings}  DG Shoulder Left  Result Date: 04/30/2022 CLINICAL DATA:  Left shoulder pain without injury. Shoulder surgery May 2023 EXAM: LEFT SHOULDER - 2+ VIEW COMPARISON:  02/18/2022 FINDINGS: Reverse shoulder arthroplasty. Normal alignment. No fracture or complication. No change from the prior study. IMPRESSION: Satisfactory left shoulder replacement.  No acute abnormality. Electronically Signed   By: Franchot Gallo M.D.   On: 04/30/2022 14:29     PROCEDURES:  Critical Care performed: No  Procedures   MEDICATIONS ORDERED IN ED: Medications - No data to display   IMPRESSION / MDM / [place] / ED COURSE  I reviewed the triage vital signs and the nursing notes.                              Differential diagnosis includes, but is not limited to, strain, shoulder fracture, hardware malfunction  Patient's presentation is most consistent with acute complicated  illness / injury requiring diagnostic workup.  Geriatric patient to the ED for evaluation of acute left shoulder pain about 6 weeks status post reverse shoulder arthroplasty.  Patient presents without recent injury, trauma, fall patient has some functional pain to the left shoulder with range of motion.  No red flags on exam.  No radiologic evidence of any acute fracture or hardware failure.  Patient's diagnosis is consistent with acute left shoulder pain. Patient will be discharged home with prescriptions for Ultram. Patient is to follow up with orthopedics as needed or otherwise directed. Patient is given ED precautions to return to the ED for any worsening or new symptoms.  Clinical Course as of 04/30/22 1448  Tue Apr 30, 2022  1436 DG Shoulder Left [FC]    Clinical Course User Index [FC] Coyer,  Alen Blew    FINAL CLINICAL IMPRESSION(S) / ED DIAGNOSES   Final diagnoses:  Acute pain of left shoulder     Rx / DC Orders   ED Discharge Orders     None      With a history  Note:  This document was prepared using Dragon voice recognition software and may include unintentional dictation errors.    Melvenia Needles, PA-C 04/30/22 1448    Duffy Bruce, MD 05/01/22 1549

## 2022-04-30 NOTE — ED Triage Notes (Signed)
Patient c/o left shoulder pain since Sunday afternoon. Denies any injury. Had reverse shoulder surgery May 22nd.

## 2022-04-30 NOTE — ED Provider Triage Note (Signed)
Emergency Medicine Provider Triage Evaluation Note  Lori Harrell , a 81 y.o. female  was evaluated in triage.  Pt complains of left shoulder pain x 2 days.  Had surgery May 2023 with Dr. [name].    Review of Systems  Positive: Pain with movement Negative: - CP, SOB  Physical Exam  BP (!) 161/94 (BP Location: Right Arm)   Pulse 78   Temp 98.1 F (36.7 C) (Oral)   Resp 16   Ht '5\' 1"'$  (1.549 m)   Wt 57.2 kg   SpO2 100%   BMI 23.81 kg/m  Gen:   Awake, no distress   Resp:  Normal effort  MSK:   Left shoulder moderate tenderness generalized.   Other:    Medical Decision Making  Medically screening exam initiated at 1:36 PM.  Appropriate orders placed.  [name] was informed that the remainder of the evaluation will be completed by another provider, this initial triage assessment does not replace that evaluation, and the importance of remaining in the ED until their evaluation is complete.     Johnn Hai, PA-C 04/30/22 1343

## 2022-05-01 ENCOUNTER — Other Ambulatory Visit: Payer: Self-pay | Admitting: Internal Medicine

## 2022-05-01 DIAGNOSIS — E119 Type 2 diabetes mellitus without complications: Secondary | ICD-10-CM

## 2022-05-02 NOTE — Telephone Encounter (Signed)
Requested Prescriptions  Pending Prescriptions Disp Refills  . Lancets (FREESTYLE) lancets [Pharmacy Med Name: LANCETS FREESTYLE 100'S 28G] 100 each 3    Sig: USE 1 LANCET DAILY     Endocrinology: Diabetes - Testing Supplies Passed - 05/01/2022  3:12 PM      Passed - Valid encounter within last 12 months    Recent Outpatient Visits          1 week ago Essential (primary) hypertension   [facility] Glean Hess, MD   4 months ago Essential (primary) hypertension   [facility] Glean Hess, MD   11 months ago Type II diabetes mellitus with complication [facility])   [facility] Glean Hess, MD   1 year ago Essential (primary) hypertension   [facility] Glean Hess, MD   1 year ago Type II diabetes mellitus with complication [facility])   [facility] Glean Hess, MD      Future Appointments            In 3 months Army Melia Jesse Sans, MD [facility], [place]   In 7 months Army Melia Jesse Sans, MD [facility], [facility]

## 2022-05-06 DIAGNOSIS — Z96612 Presence of left artificial shoulder joint: Secondary | ICD-10-CM | POA: Diagnosis not present

## 2022-05-06 DIAGNOSIS — M8580 Other specified disorders of bone density and structure, unspecified site: Secondary | ICD-10-CM | POA: Diagnosis not present

## 2022-05-06 DIAGNOSIS — I10 Essential (primary) hypertension: Secondary | ICD-10-CM | POA: Diagnosis not present

## 2022-05-06 DIAGNOSIS — Z471 Aftercare following joint replacement surgery: Secondary | ICD-10-CM | POA: Diagnosis not present

## 2022-05-06 DIAGNOSIS — M810 Age-related osteoporosis without current pathological fracture: Secondary | ICD-10-CM | POA: Diagnosis not present

## 2022-05-06 DIAGNOSIS — E119 Type 2 diabetes mellitus without complications: Secondary | ICD-10-CM | POA: Diagnosis not present

## 2022-05-11 DIAGNOSIS — K449 Diaphragmatic hernia without obstruction or gangrene: Secondary | ICD-10-CM | POA: Diagnosis not present

## 2022-05-11 DIAGNOSIS — Z471 Aftercare following joint replacement surgery: Secondary | ICD-10-CM | POA: Diagnosis not present

## 2022-05-11 DIAGNOSIS — E119 Type 2 diabetes mellitus without complications: Secondary | ICD-10-CM | POA: Diagnosis not present

## 2022-05-11 DIAGNOSIS — D649 Anemia, unspecified: Secondary | ICD-10-CM | POA: Diagnosis not present

## 2022-05-11 DIAGNOSIS — Z7984 Long term (current) use of oral hypoglycemic drugs: Secondary | ICD-10-CM | POA: Diagnosis not present

## 2022-05-11 DIAGNOSIS — K5732 Diverticulitis of large intestine without perforation or abscess without bleeding: Secondary | ICD-10-CM | POA: Diagnosis not present

## 2022-05-11 DIAGNOSIS — Z90710 Acquired absence of both cervix and uterus: Secondary | ICD-10-CM | POA: Diagnosis not present

## 2022-05-11 DIAGNOSIS — Z96612 Presence of left artificial shoulder joint: Secondary | ICD-10-CM | POA: Diagnosis not present

## 2022-05-11 DIAGNOSIS — H40053 Ocular hypertension, bilateral: Secondary | ICD-10-CM | POA: Diagnosis not present

## 2022-05-11 DIAGNOSIS — I10 Essential (primary) hypertension: Secondary | ICD-10-CM | POA: Diagnosis not present

## 2022-05-11 DIAGNOSIS — M810 Age-related osteoporosis without current pathological fracture: Secondary | ICD-10-CM | POA: Diagnosis not present

## 2022-05-11 DIAGNOSIS — Z9049 Acquired absence of other specified parts of digestive tract: Secondary | ICD-10-CM | POA: Diagnosis not present

## 2022-05-11 DIAGNOSIS — M8580 Other specified disorders of bone density and structure, unspecified site: Secondary | ICD-10-CM | POA: Diagnosis not present

## 2022-05-11 DIAGNOSIS — E7800 Pure hypercholesterolemia, unspecified: Secondary | ICD-10-CM | POA: Diagnosis not present

## 2022-05-11 DIAGNOSIS — K219 Gastro-esophageal reflux disease without esophagitis: Secondary | ICD-10-CM | POA: Diagnosis not present

## 2022-05-11 DIAGNOSIS — K2270 Barrett's esophagus without dysplasia: Secondary | ICD-10-CM | POA: Diagnosis not present

## 2022-05-14 ENCOUNTER — Other Ambulatory Visit (INDEPENDENT_AMBULATORY_CARE_PROVIDER_SITE_OTHER): Payer: Medicare Other | Admitting: Internal Medicine

## 2022-05-14 DIAGNOSIS — K449 Diaphragmatic hernia without obstruction or gangrene: Secondary | ICD-10-CM

## 2022-05-14 DIAGNOSIS — Z90710 Acquired absence of both cervix and uterus: Secondary | ICD-10-CM

## 2022-05-14 DIAGNOSIS — K2270 Barrett's esophagus without dysplasia: Secondary | ICD-10-CM

## 2022-05-14 DIAGNOSIS — K5732 Diverticulitis of large intestine without perforation or abscess without bleeding: Secondary | ICD-10-CM

## 2022-05-14 DIAGNOSIS — E118 Type 2 diabetes mellitus with unspecified complications: Secondary | ICD-10-CM

## 2022-05-14 DIAGNOSIS — E1169 Type 2 diabetes mellitus with other specified complication: Secondary | ICD-10-CM

## 2022-05-14 DIAGNOSIS — Z471 Aftercare following joint replacement surgery: Secondary | ICD-10-CM

## 2022-05-14 DIAGNOSIS — E119 Type 2 diabetes mellitus without complications: Secondary | ICD-10-CM

## 2022-05-14 DIAGNOSIS — Z96612 Presence of left artificial shoulder joint: Secondary | ICD-10-CM | POA: Diagnosis not present

## 2022-05-14 DIAGNOSIS — M810 Age-related osteoporosis without current pathological fracture: Secondary | ICD-10-CM

## 2022-05-14 DIAGNOSIS — I10 Essential (primary) hypertension: Secondary | ICD-10-CM

## 2022-05-14 DIAGNOSIS — Z7984 Long term (current) use of oral hypoglycemic drugs: Secondary | ICD-10-CM

## 2022-05-14 DIAGNOSIS — K2100 Gastro-esophageal reflux disease with esophagitis, without bleeding: Secondary | ICD-10-CM

## 2022-05-14 DIAGNOSIS — E785 Hyperlipidemia, unspecified: Secondary | ICD-10-CM

## 2022-05-14 NOTE — Progress Notes (Signed)
Received home health orders orders from [facility]. Start of care 05/11/22.   Certification and orders from 05/11/22 through 07/09/22. are reviewed, signed and faxed back to home health company.  Need of intermittent skilled services at home: Homebound  The home health care plan has been established by me and will be reviewed and updated as needed to maximize patient recovery.  I certify that all home health services have been and will be furnished to the patient while under my care.  Face-to-face encounter in which the need for home health services was established: 05/02/22 with Orthopedic Surgeon.  Patient is receiving home health services for the following diagnoses: Problem List Items Addressed This Visit       Cardiovascular and Mediastinum   Essential (primary) hypertension (Chronic)     Digestive   Barrett's esophagus without dysplasia (Chronic)     Endocrine   Hyperlipidemia associated with type 2 diabetes mellitus ([place]) (Chronic)   Type II diabetes mellitus with complication (HCC) (Chronic)     Musculoskeletal and Integument   OP (osteoporosis)   Other Visit Diagnoses     Aftercare following left shoulder joint replacement surgery    -  Primary   Presence of left artificial shoulder joint       Gastroesophageal reflux disease with esophagitis without hemorrhage       Hiatal hernia       Diverticulitis of colon (without mention of hemorrhage)(562.11)       Diabetes mellitus treated with oral medication ([place])       Acquired absence of both cervix and uterus            Halina Maidens, MD

## 2022-05-20 DIAGNOSIS — E119 Type 2 diabetes mellitus without complications: Secondary | ICD-10-CM | POA: Diagnosis not present

## 2022-05-20 DIAGNOSIS — M810 Age-related osteoporosis without current pathological fracture: Secondary | ICD-10-CM | POA: Diagnosis not present

## 2022-05-20 DIAGNOSIS — I10 Essential (primary) hypertension: Secondary | ICD-10-CM | POA: Diagnosis not present

## 2022-05-20 DIAGNOSIS — Z96612 Presence of left artificial shoulder joint: Secondary | ICD-10-CM | POA: Diagnosis not present

## 2022-05-20 DIAGNOSIS — M8580 Other specified disorders of bone density and structure, unspecified site: Secondary | ICD-10-CM | POA: Diagnosis not present

## 2022-05-20 DIAGNOSIS — Z471 Aftercare following joint replacement surgery: Secondary | ICD-10-CM | POA: Diagnosis not present

## 2022-05-24 DIAGNOSIS — Z471 Aftercare following joint replacement surgery: Secondary | ICD-10-CM | POA: Diagnosis not present

## 2022-05-24 DIAGNOSIS — M810 Age-related osteoporosis without current pathological fracture: Secondary | ICD-10-CM | POA: Diagnosis not present

## 2022-05-24 DIAGNOSIS — E119 Type 2 diabetes mellitus without complications: Secondary | ICD-10-CM | POA: Diagnosis not present

## 2022-05-24 DIAGNOSIS — M8580 Other specified disorders of bone density and structure, unspecified site: Secondary | ICD-10-CM | POA: Diagnosis not present

## 2022-05-24 DIAGNOSIS — I10 Essential (primary) hypertension: Secondary | ICD-10-CM | POA: Diagnosis not present

## 2022-05-24 DIAGNOSIS — Z96612 Presence of left artificial shoulder joint: Secondary | ICD-10-CM | POA: Diagnosis not present

## 2022-05-27 DIAGNOSIS — E119 Type 2 diabetes mellitus without complications: Secondary | ICD-10-CM | POA: Diagnosis not present

## 2022-05-27 DIAGNOSIS — Z96612 Presence of left artificial shoulder joint: Secondary | ICD-10-CM | POA: Diagnosis not present

## 2022-05-27 DIAGNOSIS — M810 Age-related osteoporosis without current pathological fracture: Secondary | ICD-10-CM | POA: Diagnosis not present

## 2022-05-27 DIAGNOSIS — Z471 Aftercare following joint replacement surgery: Secondary | ICD-10-CM | POA: Diagnosis not present

## 2022-05-27 DIAGNOSIS — I10 Essential (primary) hypertension: Secondary | ICD-10-CM | POA: Diagnosis not present

## 2022-05-27 DIAGNOSIS — M8580 Other specified disorders of bone density and structure, unspecified site: Secondary | ICD-10-CM | POA: Diagnosis not present

## 2022-05-29 ENCOUNTER — Other Ambulatory Visit: Payer: Self-pay | Admitting: Internal Medicine

## 2022-05-30 NOTE — Telephone Encounter (Signed)
Requested Prescriptions  Pending Prescriptions Disp Refills  . FREESTYLE LITE test strip [Pharmacy Med Name: [place] 50'S] 100 strip 3    Sig: USE TO TEST BLOOD SUGAR DAILY     Endocrinology: Diabetes - Testing Supplies Passed - 05/29/2022  2:47 PM      Passed - Valid encounter within last 12 months    Recent Outpatient Visits          1 month ago Essential (primary) hypertension   [place] [facility] at [facility], Jesse Sans, MD   5 months ago Essential (primary) hypertension   [place] [facility] at [facility], Jesse Sans, MD   12 months ago Type II diabetes mellitus with complication [facility])   [place] [facility] at [facility], Jesse Sans, MD   1 year ago Essential (primary) hypertension   [place] [facility] at [facility], Jesse Sans, MD   1 year ago Type II diabetes mellitus with complication [facility])   [place] [facility] at [facility], Jesse Sans, MD      Future Appointments            In 2 months Army Melia, Jesse Sans, MD [facility] Primary Care and [facility] Medicine at [facility], [facility]   In 6 months Army Melia, Jesse Sans, MD [place] [facility] at [facility], [facility]

## 2022-05-31 DIAGNOSIS — Z471 Aftercare following joint replacement surgery: Secondary | ICD-10-CM | POA: Diagnosis not present

## 2022-05-31 DIAGNOSIS — I10 Essential (primary) hypertension: Secondary | ICD-10-CM | POA: Diagnosis not present

## 2022-05-31 DIAGNOSIS — Z96612 Presence of left artificial shoulder joint: Secondary | ICD-10-CM | POA: Diagnosis not present

## 2022-05-31 DIAGNOSIS — M8580 Other specified disorders of bone density and structure, unspecified site: Secondary | ICD-10-CM | POA: Diagnosis not present

## 2022-05-31 DIAGNOSIS — E119 Type 2 diabetes mellitus without complications: Secondary | ICD-10-CM | POA: Diagnosis not present

## 2022-05-31 DIAGNOSIS — M810 Age-related osteoporosis without current pathological fracture: Secondary | ICD-10-CM | POA: Diagnosis not present

## 2022-06-04 DIAGNOSIS — E119 Type 2 diabetes mellitus without complications: Secondary | ICD-10-CM | POA: Diagnosis not present

## 2022-06-04 DIAGNOSIS — Z471 Aftercare following joint replacement surgery: Secondary | ICD-10-CM | POA: Diagnosis not present

## 2022-06-04 DIAGNOSIS — M8580 Other specified disorders of bone density and structure, unspecified site: Secondary | ICD-10-CM | POA: Diagnosis not present

## 2022-06-04 DIAGNOSIS — Z96612 Presence of left artificial shoulder joint: Secondary | ICD-10-CM | POA: Diagnosis not present

## 2022-06-04 DIAGNOSIS — I10 Essential (primary) hypertension: Secondary | ICD-10-CM | POA: Diagnosis not present

## 2022-06-04 DIAGNOSIS — M810 Age-related osteoporosis without current pathological fracture: Secondary | ICD-10-CM | POA: Diagnosis not present

## 2022-06-06 ENCOUNTER — Other Ambulatory Visit: Payer: Self-pay | Admitting: Orthopedic Surgery

## 2022-06-06 DIAGNOSIS — M87019 Idiopathic aseptic necrosis of unspecified shoulder: Secondary | ICD-10-CM

## 2022-06-06 DIAGNOSIS — M87012 Idiopathic aseptic necrosis of left shoulder: Secondary | ICD-10-CM | POA: Diagnosis not present

## 2022-06-06 DIAGNOSIS — Z96612 Presence of left artificial shoulder joint: Secondary | ICD-10-CM | POA: Diagnosis not present

## 2022-06-07 DIAGNOSIS — E119 Type 2 diabetes mellitus without complications: Secondary | ICD-10-CM | POA: Diagnosis not present

## 2022-06-07 DIAGNOSIS — I10 Essential (primary) hypertension: Secondary | ICD-10-CM | POA: Diagnosis not present

## 2022-06-07 DIAGNOSIS — Z96612 Presence of left artificial shoulder joint: Secondary | ICD-10-CM | POA: Diagnosis not present

## 2022-06-07 DIAGNOSIS — Z471 Aftercare following joint replacement surgery: Secondary | ICD-10-CM | POA: Diagnosis not present

## 2022-06-07 DIAGNOSIS — M8580 Other specified disorders of bone density and structure, unspecified site: Secondary | ICD-10-CM | POA: Diagnosis not present

## 2022-06-07 DIAGNOSIS — M810 Age-related osteoporosis without current pathological fracture: Secondary | ICD-10-CM | POA: Diagnosis not present

## 2022-06-10 DIAGNOSIS — K219 Gastro-esophageal reflux disease without esophagitis: Secondary | ICD-10-CM | POA: Diagnosis not present

## 2022-06-10 DIAGNOSIS — K449 Diaphragmatic hernia without obstruction or gangrene: Secondary | ICD-10-CM | POA: Diagnosis not present

## 2022-06-10 DIAGNOSIS — E119 Type 2 diabetes mellitus without complications: Secondary | ICD-10-CM | POA: Diagnosis not present

## 2022-06-10 DIAGNOSIS — M8580 Other specified disorders of bone density and structure, unspecified site: Secondary | ICD-10-CM | POA: Diagnosis not present

## 2022-06-10 DIAGNOSIS — K5732 Diverticulitis of large intestine without perforation or abscess without bleeding: Secondary | ICD-10-CM | POA: Diagnosis not present

## 2022-06-10 DIAGNOSIS — E7800 Pure hypercholesterolemia, unspecified: Secondary | ICD-10-CM | POA: Diagnosis not present

## 2022-06-10 DIAGNOSIS — M810 Age-related osteoporosis without current pathological fracture: Secondary | ICD-10-CM | POA: Diagnosis not present

## 2022-06-10 DIAGNOSIS — Z90710 Acquired absence of both cervix and uterus: Secondary | ICD-10-CM | POA: Diagnosis not present

## 2022-06-10 DIAGNOSIS — Z471 Aftercare following joint replacement surgery: Secondary | ICD-10-CM | POA: Diagnosis not present

## 2022-06-10 DIAGNOSIS — D649 Anemia, unspecified: Secondary | ICD-10-CM | POA: Diagnosis not present

## 2022-06-10 DIAGNOSIS — Z9049 Acquired absence of other specified parts of digestive tract: Secondary | ICD-10-CM | POA: Diagnosis not present

## 2022-06-10 DIAGNOSIS — I10 Essential (primary) hypertension: Secondary | ICD-10-CM | POA: Diagnosis not present

## 2022-06-10 DIAGNOSIS — H40053 Ocular hypertension, bilateral: Secondary | ICD-10-CM | POA: Diagnosis not present

## 2022-06-10 DIAGNOSIS — Z96612 Presence of left artificial shoulder joint: Secondary | ICD-10-CM | POA: Diagnosis not present

## 2022-06-10 DIAGNOSIS — K2270 Barrett's esophagus without dysplasia: Secondary | ICD-10-CM | POA: Diagnosis not present

## 2022-06-10 DIAGNOSIS — Z7984 Long term (current) use of oral hypoglycemic drugs: Secondary | ICD-10-CM | POA: Diagnosis not present

## 2022-06-14 DIAGNOSIS — E119 Type 2 diabetes mellitus without complications: Secondary | ICD-10-CM | POA: Diagnosis not present

## 2022-06-14 DIAGNOSIS — I10 Essential (primary) hypertension: Secondary | ICD-10-CM | POA: Diagnosis not present

## 2022-06-14 DIAGNOSIS — Z471 Aftercare following joint replacement surgery: Secondary | ICD-10-CM | POA: Diagnosis not present

## 2022-06-14 DIAGNOSIS — Z96612 Presence of left artificial shoulder joint: Secondary | ICD-10-CM | POA: Diagnosis not present

## 2022-06-14 DIAGNOSIS — M810 Age-related osteoporosis without current pathological fracture: Secondary | ICD-10-CM | POA: Diagnosis not present

## 2022-06-14 DIAGNOSIS — M8580 Other specified disorders of bone density and structure, unspecified site: Secondary | ICD-10-CM | POA: Diagnosis not present

## 2022-06-17 DIAGNOSIS — E119 Type 2 diabetes mellitus without complications: Secondary | ICD-10-CM | POA: Diagnosis not present

## 2022-06-17 DIAGNOSIS — Z471 Aftercare following joint replacement surgery: Secondary | ICD-10-CM | POA: Diagnosis not present

## 2022-06-17 DIAGNOSIS — M8580 Other specified disorders of bone density and structure, unspecified site: Secondary | ICD-10-CM | POA: Diagnosis not present

## 2022-06-17 DIAGNOSIS — M810 Age-related osteoporosis without current pathological fracture: Secondary | ICD-10-CM | POA: Diagnosis not present

## 2022-06-17 DIAGNOSIS — I10 Essential (primary) hypertension: Secondary | ICD-10-CM | POA: Diagnosis not present

## 2022-06-17 DIAGNOSIS — Z96612 Presence of left artificial shoulder joint: Secondary | ICD-10-CM | POA: Diagnosis not present

## 2022-06-19 ENCOUNTER — Ambulatory Visit
Admission: RE | Admit: 2022-06-19 | Discharge: 2022-06-19 | Disposition: A | Discharge status: Home / Self Care | Payer: Medicare Other | Source: Ambulatory Visit | Attending: Orthopedic Surgery | Admitting: Orthopedic Surgery

## 2022-06-19 DIAGNOSIS — Z96642 Presence of left artificial hip joint: Secondary | ICD-10-CM | POA: Diagnosis not present

## 2022-06-19 DIAGNOSIS — S42102A Fracture of unspecified part of scapula, left shoulder, initial encounter for closed fracture: Secondary | ICD-10-CM | POA: Diagnosis not present

## 2022-06-19 DIAGNOSIS — M7989 Other specified soft tissue disorders: Secondary | ICD-10-CM | POA: Diagnosis not present

## 2022-06-19 DIAGNOSIS — M25412 Effusion, left shoulder: Secondary | ICD-10-CM | POA: Diagnosis not present

## 2022-06-19 DIAGNOSIS — M87019 Idiopathic aseptic necrosis of unspecified shoulder: Secondary | ICD-10-CM | POA: Insufficient documentation

## 2022-06-24 DIAGNOSIS — M8580 Other specified disorders of bone density and structure, unspecified site: Secondary | ICD-10-CM | POA: Diagnosis not present

## 2022-06-24 DIAGNOSIS — M810 Age-related osteoporosis without current pathological fracture: Secondary | ICD-10-CM | POA: Diagnosis not present

## 2022-06-24 DIAGNOSIS — E119 Type 2 diabetes mellitus without complications: Secondary | ICD-10-CM | POA: Diagnosis not present

## 2022-06-24 DIAGNOSIS — Z471 Aftercare following joint replacement surgery: Secondary | ICD-10-CM | POA: Diagnosis not present

## 2022-06-24 DIAGNOSIS — Z96612 Presence of left artificial shoulder joint: Secondary | ICD-10-CM | POA: Diagnosis not present

## 2022-06-24 DIAGNOSIS — I10 Essential (primary) hypertension: Secondary | ICD-10-CM | POA: Diagnosis not present

## 2022-06-25 ENCOUNTER — Other Ambulatory Visit: Payer: Self-pay | Admitting: Internal Medicine

## 2022-06-25 DIAGNOSIS — I10 Essential (primary) hypertension: Secondary | ICD-10-CM

## 2022-06-25 NOTE — Telephone Encounter (Signed)
Requested Prescriptions  Pending Prescriptions Disp Refills  . spironolactone (ALDACTONE) 25 MG tablet [Pharmacy Med Name: SPIRONOLACTONE TABS 25MG ] 90 tablet 0    Sig: TAKE 1 TABLET DAILY     Cardiovascular: Diuretics - Aldosterone Antagonist Failed - 06/25/2022  3:18 AM      Failed - Last BP in normal range    BP Readings from Last 1 Encounters:  04/30/22 (!) 161/94         Passed - Cr in normal range and within 180 days    Creatinine, Ser  Date Value Ref Range Status  02/06/2022 0.65 0.44 - 1.00 mg/dL Final         Passed - K in normal range and within 180 days    Potassium  Date Value Ref Range Status  02/06/2022 4.3 3.5 - 5.1 mmol/L Final         Passed - Na in normal range and within 180 days    Sodium  Date Value Ref Range Status  02/06/2022 136 135 - 145 mmol/L Final  12/14/2021 136 134 - 144 mmol/L Final         Passed - eGFR is 30 or above and within 180 days    GFR calc Af Amer  Date Value Ref Range Status  08/28/2020 72 >59 mL/min/1.73 Final    Comment:    **In accordance with recommendations from the NKF-ASN Task force,**   [facility] is in the process of updating its eGFR calculation to the   2021 CKD-EPI creatinine equation that estimates kidney function   without a race variable.    GFR, Estimated  Date Value Ref Range Status  02/06/2022 >60 >60 mL/min Final    Comment:    (NOTE) Calculated using the CKD-EPI Creatinine Equation (2021)    eGFR  Date Value Ref Range Status  12/14/2021 77 >59 mL/min/1.73 Final         Passed - Valid encounter within last 6 months    Recent Outpatient Visits          2 months ago Essential (primary) hypertension   [place] [facility] at [facility], Jesse Sans, MD   6 months ago Essential (primary) hypertension   [place] [facility] at [facility], Jesse Sans, MD   1 year ago Type II diabetes mellitus with complication [facility])   [place]  Primary Care and [facility] Medicine at [facility], Jesse Sans, MD   1 year ago Essential (primary) hypertension   [place] [facility] at [facility], Jesse Sans, MD   1 year ago Type II diabetes mellitus with complication [facility])   [place] [facility] Medicine at [facility], Jesse Sans, MD      Future Appointments            In 2 months Army Melia, Jesse Sans, MD [facility] Care and [facility] Medicine at [facility], [facility]   In 5 months Army Melia, Jesse Sans, MD [place] [facility] and [facility] Medicine at [facility], [facility]

## 2022-06-27 ENCOUNTER — Other Ambulatory Visit: Payer: Self-pay | Admitting: Internal Medicine

## 2022-06-27 DIAGNOSIS — E1169 Type 2 diabetes mellitus with other specified complication: Secondary | ICD-10-CM

## 2022-06-27 NOTE — Telephone Encounter (Signed)
Requested Prescriptions  Pending Prescriptions Disp Refills  . atorvastatin (LIPITOR) 20 MG tablet [Pharmacy Med Name: ATORVASTATIN TABS '20MG'$ ] 90 tablet 1    Sig: TAKE 1 TABLET DAILY     Cardiovascular:  Antilipid - Statins Failed - 06/27/2022  3:19 AM      Failed - Lipid Panel in normal range within the last 12 months    Cholesterol, Total  Date Value Ref Range Status  12/14/2021 159 100 - 199 mg/dL Final   LDL Chol Calc (NIH)  Date Value Ref Range Status  12/14/2021 91 0 - 99 mg/dL Final   HDL  Date Value Ref Range Status  12/14/2021 52 >39 mg/dL Final   Triglycerides  Date Value Ref Range Status  12/14/2021 87 0 - 149 mg/dL Final         Passed - Patient is not pregnant      Passed - Valid encounter within last 12 months    Recent Outpatient Visits          2 months ago Essential (primary) hypertension   [place] [facility] at [facility], Jesse Sans, MD   6 months ago Essential (primary) hypertension   [place] [facility] at [facility], Jesse Sans, MD   1 year ago Type II diabetes mellitus with complication [place])   [place] [facility] at [facility], Jesse Sans, MD   1 year ago Essential (primary) hypertension   [place] [facility] at [facility], Jesse Sans, MD   1 year ago Type II diabetes mellitus with complication [facility])   [place] [facility] at [facility], Jesse Sans, MD      Future Appointments            In 2 months Army Melia, Jesse Sans, MD [facility] at [facility], [facility]   In 5 months Army Melia, Jesse Sans, MD [place] [facility] and [facility] at [facility], [facility]

## 2022-07-01 DIAGNOSIS — H401121 Primary open-angle glaucoma, left eye, mild stage: Secondary | ICD-10-CM | POA: Diagnosis not present

## 2022-07-01 DIAGNOSIS — H338 Other retinal detachments: Secondary | ICD-10-CM | POA: Diagnosis not present

## 2022-07-01 DIAGNOSIS — H04123 Dry eye syndrome of bilateral lacrimal glands: Secondary | ICD-10-CM | POA: Diagnosis not present

## 2022-07-01 DIAGNOSIS — Z961 Presence of intraocular lens: Secondary | ICD-10-CM | POA: Diagnosis not present

## 2022-07-01 DIAGNOSIS — H401113 Primary open-angle glaucoma, right eye, severe stage: Secondary | ICD-10-CM | POA: Diagnosis not present

## 2022-07-01 DIAGNOSIS — E119 Type 2 diabetes mellitus without complications: Secondary | ICD-10-CM | POA: Diagnosis not present

## 2022-07-01 LAB — HM DIABETES EYE EXAM

## 2022-07-04 ENCOUNTER — Other Ambulatory Visit: Payer: Self-pay | Admitting: Internal Medicine

## 2022-07-04 NOTE — Telephone Encounter (Signed)
Requested Prescriptions  Pending Prescriptions Disp Refills  . enalapril (VASOTEC) 20 MG tablet [Pharmacy Med Name: ENALAPRIL MALEATE TABS '20MG'$ ] 90 tablet 0    Sig: TAKE 1 TABLET DAILY     Cardiovascular:  ACE Inhibitors Failed - 07/04/2022  3:18 AM      Failed - Last BP in normal range    BP Readings from Last 1 Encounters:  04/30/22 (!) 161/94         Passed - Cr in normal range and within 180 days    Creatinine, Ser  Date Value Ref Range Status  02/06/2022 0.65 0.44 - 1.00 mg/dL Final         Passed - K in normal range and within 180 days    Potassium  Date Value Ref Range Status  02/06/2022 4.3 3.5 - 5.1 mmol/L Final         Passed - Patient is not pregnant      Passed - Valid encounter within last 6 months    Recent Outpatient Visits          2 months ago Essential (primary) hypertension   [place] [facility] at [facility], Jesse Sans, MD   6 months ago Essential (primary) hypertension   [place] [facility] at [facility], Jesse Sans, MD   1 year ago Type II diabetes mellitus with complication [facility])   [place] [facility] at [facility], Jesse Sans, MD   1 year ago Essential (primary) hypertension   [place] [facility] at [facility], Jesse Sans, MD   1 year ago Type II diabetes mellitus with complication [facility])   [place] [facility] at [facility], Jesse Sans, MD      Future Appointments            In 1 month Army Melia, Jesse Sans, MD [facility] Primary [facility] and [facility] Medicine at [facility], [facility]   In 5 months Army Melia, Jesse Sans, MD [place] [facility] at [facility], [facility]

## 2022-07-05 DIAGNOSIS — E119 Type 2 diabetes mellitus without complications: Secondary | ICD-10-CM | POA: Diagnosis not present

## 2022-07-05 DIAGNOSIS — M8580 Other specified disorders of bone density and structure, unspecified site: Secondary | ICD-10-CM | POA: Diagnosis not present

## 2022-07-05 DIAGNOSIS — M810 Age-related osteoporosis without current pathological fracture: Secondary | ICD-10-CM | POA: Diagnosis not present

## 2022-07-05 DIAGNOSIS — Z96612 Presence of left artificial shoulder joint: Secondary | ICD-10-CM | POA: Diagnosis not present

## 2022-07-05 DIAGNOSIS — Z471 Aftercare following joint replacement surgery: Secondary | ICD-10-CM | POA: Diagnosis not present

## 2022-07-05 DIAGNOSIS — I10 Essential (primary) hypertension: Secondary | ICD-10-CM | POA: Diagnosis not present

## 2022-07-10 ENCOUNTER — Other Ambulatory Visit: Payer: Self-pay | Admitting: Internal Medicine

## 2022-07-10 DIAGNOSIS — H40053 Ocular hypertension, bilateral: Secondary | ICD-10-CM | POA: Diagnosis not present

## 2022-07-10 DIAGNOSIS — Z90710 Acquired absence of both cervix and uterus: Secondary | ICD-10-CM | POA: Diagnosis not present

## 2022-07-10 DIAGNOSIS — M810 Age-related osteoporosis without current pathological fracture: Secondary | ICD-10-CM | POA: Diagnosis not present

## 2022-07-10 DIAGNOSIS — Z7984 Long term (current) use of oral hypoglycemic drugs: Secondary | ICD-10-CM | POA: Diagnosis not present

## 2022-07-10 DIAGNOSIS — Z9049 Acquired absence of other specified parts of digestive tract: Secondary | ICD-10-CM | POA: Diagnosis not present

## 2022-07-10 DIAGNOSIS — D649 Anemia, unspecified: Secondary | ICD-10-CM | POA: Diagnosis not present

## 2022-07-10 DIAGNOSIS — K5732 Diverticulitis of large intestine without perforation or abscess without bleeding: Secondary | ICD-10-CM | POA: Diagnosis not present

## 2022-07-10 DIAGNOSIS — E119 Type 2 diabetes mellitus without complications: Secondary | ICD-10-CM | POA: Diagnosis not present

## 2022-07-10 DIAGNOSIS — K219 Gastro-esophageal reflux disease without esophagitis: Secondary | ICD-10-CM | POA: Diagnosis not present

## 2022-07-10 DIAGNOSIS — Z96612 Presence of left artificial shoulder joint: Secondary | ICD-10-CM | POA: Diagnosis not present

## 2022-07-10 DIAGNOSIS — K2270 Barrett's esophagus without dysplasia: Secondary | ICD-10-CM

## 2022-07-10 DIAGNOSIS — Z471 Aftercare following joint replacement surgery: Secondary | ICD-10-CM | POA: Diagnosis not present

## 2022-07-10 DIAGNOSIS — I10 Essential (primary) hypertension: Secondary | ICD-10-CM | POA: Diagnosis not present

## 2022-07-10 DIAGNOSIS — K449 Diaphragmatic hernia without obstruction or gangrene: Secondary | ICD-10-CM | POA: Diagnosis not present

## 2022-07-10 DIAGNOSIS — M8580 Other specified disorders of bone density and structure, unspecified site: Secondary | ICD-10-CM | POA: Diagnosis not present

## 2022-07-10 DIAGNOSIS — E7800 Pure hypercholesterolemia, unspecified: Secondary | ICD-10-CM | POA: Diagnosis not present

## 2022-07-11 DIAGNOSIS — Z96612 Presence of left artificial shoulder joint: Secondary | ICD-10-CM | POA: Diagnosis not present

## 2022-07-11 DIAGNOSIS — Z471 Aftercare following joint replacement surgery: Secondary | ICD-10-CM | POA: Diagnosis not present

## 2022-07-11 DIAGNOSIS — M810 Age-related osteoporosis without current pathological fracture: Secondary | ICD-10-CM | POA: Diagnosis not present

## 2022-07-11 DIAGNOSIS — M8580 Other specified disorders of bone density and structure, unspecified site: Secondary | ICD-10-CM | POA: Diagnosis not present

## 2022-07-11 DIAGNOSIS — E119 Type 2 diabetes mellitus without complications: Secondary | ICD-10-CM | POA: Diagnosis not present

## 2022-07-11 DIAGNOSIS — I10 Essential (primary) hypertension: Secondary | ICD-10-CM | POA: Diagnosis not present

## 2022-07-11 NOTE — Telephone Encounter (Signed)
Requested Prescriptions  Pending Prescriptions Disp Refills  . esomeprazole (NEXIUM) 40 MG capsule [Pharmacy Med Name: ESOMEPRAZOLE MAGNESIUM DR CAPS '40MG'$ ] 180 capsule 3    Sig: TAKE 1 CAPSULE TWICE A DAY     Gastroenterology: Proton Pump Inhibitors 2 Passed - 07/10/2022  7:20 PM      Passed - ALT in normal range and within 360 days    ALT  Date Value Ref Range Status  02/06/2022 20 0 - 44 U/L Final         Passed - AST in normal range and within 360 days    AST  Date Value Ref Range Status  02/06/2022 23 15 - 41 U/L Final         Passed - Valid encounter within last 12 months    Recent Outpatient Visits          2 months ago Essential (primary) hypertension   [place] [facility] at [facility], Jesse Sans, MD   6 months ago Essential (primary) hypertension   [place] [facility] at [facility], Jesse Sans, MD   1 year ago Type II diabetes mellitus with complication [facility])   [place] [facility] at [facility], Jesse Sans, MD   1 year ago Essential (primary) hypertension   [place] [facility] at [facility], Jesse Sans, MD   1 year ago Type II diabetes mellitus with complication [facility])   [place] [facility] and [facility] Medicine at [facility], Jesse Sans, MD      Future Appointments            In 1 month Army Melia, Jesse Sans, MD [facility] Primary Care and Sports Medicine at [facility], [facility]   In 5 months Army Melia, Jesse Sans, MD [place] [facility] at [facility], [facility]

## 2022-07-17 ENCOUNTER — Other Ambulatory Visit (INDEPENDENT_AMBULATORY_CARE_PROVIDER_SITE_OTHER): Payer: Medicare Other | Admitting: Internal Medicine

## 2022-07-17 DIAGNOSIS — Z471 Aftercare following joint replacement surgery: Secondary | ICD-10-CM

## 2022-07-17 DIAGNOSIS — M810 Age-related osteoporosis without current pathological fracture: Secondary | ICD-10-CM

## 2022-07-17 DIAGNOSIS — E118 Type 2 diabetes mellitus with unspecified complications: Secondary | ICD-10-CM

## 2022-07-17 DIAGNOSIS — K5732 Diverticulitis of large intestine without perforation or abscess without bleeding: Secondary | ICD-10-CM

## 2022-07-17 DIAGNOSIS — D508 Other iron deficiency anemias: Secondary | ICD-10-CM

## 2022-07-17 DIAGNOSIS — K2270 Barrett's esophagus without dysplasia: Secondary | ICD-10-CM

## 2022-07-17 DIAGNOSIS — E1169 Type 2 diabetes mellitus with other specified complication: Secondary | ICD-10-CM

## 2022-07-17 DIAGNOSIS — K2100 Gastro-esophageal reflux disease with esophagitis, without bleeding: Secondary | ICD-10-CM

## 2022-07-17 DIAGNOSIS — Z96612 Presence of left artificial shoulder joint: Secondary | ICD-10-CM

## 2022-07-17 DIAGNOSIS — E785 Hyperlipidemia, unspecified: Secondary | ICD-10-CM

## 2022-07-17 DIAGNOSIS — I10 Essential (primary) hypertension: Secondary | ICD-10-CM

## 2022-07-17 DIAGNOSIS — Z90710 Acquired absence of both cervix and uterus: Secondary | ICD-10-CM

## 2022-07-17 NOTE — Progress Notes (Signed)
Received home health orders orders from [facility]. Start of care 03/12/22.   Certification and orders from 07/10/22 through 09/07/22. are reviewed, signed and faxed back to home health company.  Need of intermittent skilled services at home: homebound  The home health care plan has been established by me and will be reviewed and updated as needed to maximize patient recovery.  I certify that all home health services have been and will be furnished to the patient while under my care.  Face-to-face encounter in which the need for home health services was established: 04/22/22.  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

## 2022-07-18 DIAGNOSIS — M810 Age-related osteoporosis without current pathological fracture: Secondary | ICD-10-CM | POA: Diagnosis not present

## 2022-07-18 DIAGNOSIS — I10 Essential (primary) hypertension: Secondary | ICD-10-CM | POA: Diagnosis not present

## 2022-07-18 DIAGNOSIS — Z96612 Presence of left artificial shoulder joint: Secondary | ICD-10-CM | POA: Diagnosis not present

## 2022-07-18 DIAGNOSIS — M8580 Other specified disorders of bone density and structure, unspecified site: Secondary | ICD-10-CM | POA: Diagnosis not present

## 2022-07-18 DIAGNOSIS — E119 Type 2 diabetes mellitus without complications: Secondary | ICD-10-CM | POA: Diagnosis not present

## 2022-07-18 DIAGNOSIS — Z471 Aftercare following joint replacement surgery: Secondary | ICD-10-CM | POA: Diagnosis not present

## 2022-07-19 ENCOUNTER — Ambulatory Visit (INDEPENDENT_AMBULATORY_CARE_PROVIDER_SITE_OTHER): Payer: Medicare Other

## 2022-07-19 VITALS — Ht 61.0 in | Wt 126.0 lb

## 2022-07-19 DIAGNOSIS — Z0000 Encounter for general adult medical examination without abnormal findings: Secondary | ICD-10-CM

## 2022-07-19 NOTE — Progress Notes (Signed)
Subjective:   Anikka Marsan is a 81 y.o. female who presents for Medicare Annual (Subsequent) preventive examination.  I connected with  [name] on 07/19/22 by a audio enabled telemedicine application and verified that I am speaking with the correct person using two identifiers.  Patient Location: Home  Provider Location: Office/Clinic  I discussed the limitations of evaluation and management by telemedicine. The patient expressed understanding and agreed to proceed.   Review of Systems    Defer to PCP Cardiac Risk Factors include: advanced age (>38mn, >>64women);diabetes mellitus     Objective:    Today's Vitals   07/19/22 1042  Weight: 126 lb (57.2 kg)  Height: '5\' 1"'$  (1.549 m)   Body mass index is 23.81 kg/m.     07/19/2022   10:49 AM 04/30/2022    1:39 PM 02/18/2022    6:19 AM 02/06/2022    9:20 AM 10/17/2021    2:52 PM 07/18/2021    8:49 AM 07/17/2020    8:56 AM  Advanced Directives  Does Patient Have a Medical Advance Directive? Yes No Yes Yes Yes Yes Yes  Type of AParamed[place]of [place]  Living will;Healthcare Power of Attorney Living will;Healthcare Power of Attorney Living will;Healthcare Power of AB[place]Living will [place] will  Does patient want to make changes to medical advance directive?   No - Patient declined      Copy of [place] Chart? No - copy requested  Yes - validated most recent copy scanned in chart (See row information) Yes - validated most recent copy scanned in chart (See row information)  Yes - validated most recent copy scanned in chart (See row information) Yes - validated most recent copy scanned in chart (See row information)    Current Medications (verified) Outpatient Encounter Medications as of 07/19/2022  Medication Sig   acetaminophen (TYLENOL) 500 MG tablet Take 1,000 mg by mouth daily as needed  (back pain.).   atorvastatin (LIPITOR) 20 MG tablet TAKE 1 TABLET DAILY   Calcium-Magnesium-Vitamin D (CALCIUM 1200+D3 PO) Take 1 tablet by mouth every evening.   dorzolamide-timolol (COSOPT) 22.3-6.8 MG/ML ophthalmic solution Place 1 drop into both eyes 2 (two) times daily.   enalapril (VASOTEC) 20 MG tablet TAKE 1 TABLET DAILY   esomeprazole (NEXIUM) 40 MG capsule TAKE 1 CAPSULE TWICE A DAY   FREESTYLE LITE test strip USE TO TEST BLOOD SUGAR DAILY   Lancets (FREESTYLE) lancets USE 1 LANCET DAILY   latanoprost (XALATAN) 0.005 % ophthalmic solution Place 1 drop into both eyes at bedtime.   Menthol, Topical Analgesic, (BIOFREEZE) 10 % LIQD Apply 1 application. topically 3 (three) times daily as needed (pain.).   metFORMIN (GLUCOPHAGE) 500 MG tablet TAKE 1 TABLET DAILY   Multiple Vitamin (MULTIVITAMIN WITH MINERALS) TABS tablet Take 1 tablet by mouth in the morning. Women's Centrum Silver   spironolactone (ALDACTONE) 25 MG tablet TAKE 1 TABLET DAILY   No facility-administered encounter medications on file as of 07/19/2022.    Allergies (verified) Patient has no known allergies.   History: Past Medical History:  Diagnosis Date   Acid reflux    Acute cholangitis 02/01/2019   Anemia    AVN (avascular necrosis of bone) ([place] 07/07/2017   Barrett's esophagus    Basal cell carcinoma (BCC) of skin of lip 05/31/2021   Choledocholithiasis    Diabetes mellitus without complication (HCC)    type 2  Elevated lipids    Glaucoma    Heart murmur    History of detached retina repair    History of hiatal hernia    Hypercholesterolemia    Hypertension    Osteoporosis    Sigmoid diverticulitis    Unstageable pressure ulcer of back ([place]) 01/07/2019   Vitreous hemorrhage, right eye ([place]) 1992   Past Surgical History:  Procedure Laterality Date   ANTERIOR LATERAL LUMBAR FUSION WITH PERCUTANEOUS SCREW 2 LEVEL N/A 05/19/2019   Procedure: L2-4 LATERAL LUMBAR INTERBODY FUSION (XLIF);  Surgeon:  Meade Maw, MD;  Location: [facility] ORS;  Service: Neurosurgery;  Laterality: N/A;   CATARACT EXTRACTION Bilateral    CHOLECYSTECTOMY, LAPAROSCOPIC  02/05/2019   [facility]   COLONOSCOPY  2007   sigmoid diverticulosis   ENDOSCOPIC RETROGRADE CHOLANGIOPANCREATOGRAPHY (ERCP) WITH PROPOFOL N/A 02/02/2019   Procedure: ENDOSCOPIC RETROGRADE CHOLANGIOPANCREATOGRAPHY (ERCP) WITH PROPOFOL;  Surgeon: Lucilla Lame, MD;  Location: [facility] ENDOSCOPY;  Service: Endoscopy;  Laterality: N/A;   ESOPHAGOGASTRODUODENOSCOPY  04/22/2016   ESOPHAGOGASTRODUODENOSCOPY  04/26/2019   ESOPHAGOGASTRODUODENOSCOPY  09/15/2019   REVERSE SHOULDER ARTHROPLASTY Left 02/18/2022   Procedure: Left reverse shoulder arthroplasty, biceps tenodesis;  Surgeon: Leim Fabry, MD;  Location: [facility] ORS;  Service: Orthopedics;  Laterality: Left;   TONSILLECTOMY     UPPER GASTROINTESTINAL ENDOSCOPY  02/2013   Barrett's - done at [facility]   VAGINAL HYSTERECTOMY     Family History  Problem Relation Age of Onset   Diabetes Father    CAD Father    Breast cancer Neg Hx    Social History   Socioeconomic History   Marital status: Widowed    Spouse name: Not on file   Number of children: 2   Years of education: Not on file   Highest education level: 12th grade  Occupational History   Occupation: Retired  Tobacco Use   Smoking status: Never   Smokeless tobacco: Never   Tobacco comments:    smoking cessation materials not required  Vaping Use   Vaping Use: Never used  Substance and Sexual Activity   Alcohol use: No    Alcohol/week: 0.0 standard drinks of alcohol   Drug use: No   Sexual activity: Not Currently  Other Topics Concern   Not on file  Social History Narrative   Lives alone   Social Determinants of Health   Financial Resource Strain: Low Risk  (07/19/2022)   Overall Financial Resource Strain (CARDIA)    Difficulty of Paying Living Expenses: Not hard at all  Food Insecurity: No Food Insecurity (07/19/2022)   Hunger  Vital Sign    Worried About Running Out of Food in the Last Year: Never true    [place] in the Last Year: Never true  Transportation Needs: No Transportation Needs (07/18/2021)   PRAPARE - Hydrologist (Medical): No    Lack of Transportation (Non-Medical): No  Physical Activity: Sufficiently Active (07/19/2022)   Exercise Vital Sign    Days of Exercise per Week: 5 days    Minutes of Exercise per Session: 30 min  Stress: No Stress Concern Present (07/19/2022)   [place]    Feeling of Stress : Not at all  Social Connections: Moderately Isolated (07/19/2022)   Social Connection and Isolation Panel [NHANES]    Frequency of Communication with Friends and Family: More than three times a week    Frequency of Social Gatherings with Friends and Family: More than  three times a week    Attends Religious Services: More than 4 times per year    Active Member of Clubs or Organizations: No    Attends Archivist Meetings: Never    Marital Status: Widowed    Tobacco Counseling Counseling given: Not Answered Tobacco comments: smoking cessation materials not required   Clinical Intake:  Pre-visit preparation completed: Yes  Pain : No/denies pain     BMI - recorded: 23.81 Nutritional Status: BMI of 19-24  Normal Nutritional Risks: None Diabetes: Yes (110 BS this morning.)     Diabetic? Yes  Interpreter Needed?: No  Information entered by :: Wyatt Haste, CMA   Activities of Daily Living    07/19/2022   10:50 AM 02/18/2022    1:00 PM  In your present state of health, do you have any difficulty performing the following activities:  Hearing? 0 0  Vision? 0 0  Difficulty concentrating or making decisions? 0 0  Walking or climbing stairs? 1 1  Dressing or bathing? 0 0  Doing errands, shopping? 0 0  Preparing Food and eating ? N   Using the Toilet? N   In the past  six months, have you accidently leaked urine? N   Do you have problems with loss of bowel control? N   Managing your Medications? N   Managing your Finances? N   Housekeeping or managing your Housekeeping? N     Patient Care Team: Glean Hess, MD as PCP - General (Internal Medicine) Florinda Marker Early Chars, MD as Consulting Physician (Ophthalmology) Malissa Hippo, Gaspar Skeeters, MD as Consulting Physician (Gastroenterology) Lovell Sheehan, MD as Consulting Physician (Orthopedic Surgery) Meade Maw, MD as Consulting Physician (Neurosurgery)  Indicate any recent [place] you may have received from other than [facility] providers in the past year (date may be approximate).     Assessment:   This is a routine wellness examination for Lucillie.  Hearing/Vision screen Hearing Screening - Comments:: No concerns. Vision Screening - Comments:: Wears prescription glasses.  Dietary issues and exercise activities discussed:     Goals Addressed   None   Depression Screen    07/19/2022   10:49 AM 04/22/2022    9:44 AM 12/14/2021    9:23 AM 07/18/2021    8:48 AM 05/31/2021    8:47 AM 12/12/2020    8:35 AM 08/28/2020    9:26 AM  PHQ 2/9 Scores  PHQ - 2 Score 0 0 0 0 0 0 0  PHQ- 9 Score '4 2 1 2 2 '$ 0 0    Fall Risk    07/19/2022   10:50 AM 04/22/2022    9:45 AM 12/14/2021    9:23 AM 07/18/2021    8:49 AM 05/31/2021    8:47 AM  Fall Risk   Falls in the past year? '1 1 1 '$ 0 0  Number falls in past yr: 1 0 1 0 0  Injury with Fall? 1 0 1 0 0  Risk for fall due to : History of fall(s) History of fall(s) History of fall(s);Impaired balance/gait No Fall Risks No Fall Risks  Follow up Falls evaluation completed Falls evaluation completed Falls evaluation completed Falls prevention discussed Falls evaluation completed    [place]:  Any stairs in or around the home? No  If so, are there any without handrails?  N/A Home free of loose throw rugs in  walkways, pet beds, electrical cords, etc? Yes  Adequate lighting in your home  to reduce risk of falls? Yes   ASSISTIVE DEVICES UTILIZED TO PREVENT FALLS:  Life alert? No. Use of a cane, walker or w/c? Yes  Cane Grab bars in the bathroom? Yes  Shower chair or bench in shower? Yes  Elevated toilet seat or a handicapped toilet? Yes   Cognitive Function:        07/19/2022   10:51 AM 07/17/2020    8:59 AM 07/14/2019    9:06 AM 07/08/2018   10:34 AM 07/07/2017   10:19 AM  6CIT Screen  What Year? 0 points 0 points 0 points 0 points 0 points  What month? 0 points 0 points 0 points 0 points 0 points  What time? 0 points 0 points 0 points 0 points 0 points  Count back from 20 0 points 0 points 0 points 0 points 0 points  Months in reverse 0 points 0 points 0 points 0 points 0 points  Repeat phrase 2 points 0 points 2 points 0 points 0 points  Total Score 2 points 0 points 2 points 0 points 0 points    Immunizations Immunization History  Administered Date(s) Administered   Fluad Quad(high Dose 65+) 08/12/2019, 08/28/2020, 05/31/2021   Influenza, High Dose Seasonal PF 07/07/2017, 07/08/2018   Influenza, Seasonal, Injecte, Preservative Fre 08/19/2013   Influenza,inj,Quad PF,6+ Mos 06/23/2015, 07/04/2016   Influenza-Unspecified 06/18/2011, 08/21/2012   PFIZER(Purple Top)SARS-COV-2 Vaccination 12/24/2019, 01/14/2020, 10/13/2020   Pneumococcal Conjugate-13 06/20/2014   Pneumococcal Polysaccharide-23 10/02/2007   Tdap 10/01/2010    TDAP status: Due, Education has been provided regarding the importance of this vaccine. Advised may receive this vaccine at local pharmacy or Health Dept. Aware to provide a copy of the vaccination record if obtained from local pharmacy or Health Dept. Verbalized acceptance and understanding.  Flu Vaccine status: Due, Education has been provided regarding the importance of this vaccine. Advised may receive this vaccine at local pharmacy or Health Dept. Aware  to provide a copy of the vaccination record if obtained from local pharmacy or Health Dept. Verbalized acceptance and understanding.  Pneumococcal vaccine status: Up to date  Covid-19 vaccine status: Completed vaccines  Qualifies for Shingles Vaccine? Yes   Zostavax completed No   Shingrix Completed?: No.    Education has been provided regarding the importance of this vaccine. Patient has been advised to call insurance company to determine out of pocket expense if they have not yet received this vaccine. Advised may also receive vaccine at local pharmacy or Health Dept. Verbalized acceptance and understanding.  Screening Tests Health Maintenance  Topic Date Due   Zoster Vaccines- Shingrix (1 of 2) Never done   TETANUS/TDAP  10/01/2020   INFLUENZA VACCINE  04/30/2022   COVID-19 Vaccine (4 - Pfizer risk series) 08/04/2022 (Originally 12/08/2020)   HEMOGLOBIN A1C  10/23/2022   Diabetic kidney evaluation - Urine ACR  12/15/2022   Diabetic kidney evaluation - GFR measurement  02/07/2023   FOOT EXAM  04/16/2023   OPHTHALMOLOGY EXAM  07/02/2023   Pneumonia Vaccine 68+ Years old  Completed   DEXA SCAN  Completed   HPV VACCINES  Aged Out    Health Maintenance  Health Maintenance Due  Topic Date Due   Zoster Vaccines- Shingrix (1 of 2) Never done   TETANUS/TDAP  10/01/2020   INFLUENZA VACCINE  04/30/2022    Colorectal cancer screening: No longer required.   Mammogram status: No longer required due to age.  Bone Density status: Completed 02/04/2022. Results reflect: Bone density results: OSTEOPOROSIS. Repeat every  2 years.  Lung Cancer Screening: (Low Dose CT Chest recommended if Age 40-80 years, 30 pack-year currently smoking OR have quit w/in 15years.) does not qualify.   Lung Cancer Screening Referral: N/A  Additional Screening:  Hepatitis C Screening: does not qualify  Vision Screening: Recommended annual ophthalmology exams for early detection of glaucoma and other disorders  of the eye. Is the patient up to date with their annual eye exam?  Yes  Who is the provider or what is the name of the office in which the patient attends annual eye exams? [facility] in [place] If pt is not established with a provider, would they like to be referred to a provider to establish care?  N/A .   Dental Screening: Recommended annual dental exams for proper oral hygiene  Community Resource Referral / Chronic Care Management: CRR required this visit?   NO  CCM required this visit?  No      Plan:     I have personally reviewed and noted the following in the patient's chart:   Medical and social history Use of alcohol, tobacco or illicit drugs  Current medications and supplements including opioid prescriptions. Patient is not currently taking opioid prescriptions. Functional ability and status Nutritional status Physical activity Advanced directives List of other physicians Hospitalizations, surgeries, and ER visits in previous 12 months Vitals Screenings to include cognitive, depression, and falls Referrals and appointments  In addition, I have reviewed and discussed with patient certain preventive protocols, quality metrics, and best practice recommendations. A written personalized care plan for preventive services as well as general preventive health recommendations were provided to patient.     Clista Bernhardt, [place]   07/19/2022     Ms. Newhard , Thank you for taking time to come for your Medicare Wellness Visit. I appreciate your ongoing commitment to your health goals. Please review the following plan we discussed and let me know if I can assist you in the future.   These are the goals we discussed:  Goals      continue activity     Taking a class in [place] in [place] with church. She also walks 2-3 days a week 30 min and helps at church every Wed night.     DIET - EAT MORE FRUITS AND VEGETABLES     Recommend to eat 3 small healthy meals and at least  2 healthy snacks per day.      Increase water intake     Recommend to increase fluid intake from 3-4 glasses per day to 4-6 glasses per day.        This is a list of the screening recommended for you and due dates:  Health Maintenance  Topic Date Due   Zoster (Shingles) Vaccine (1 of 2) Never done   Tetanus Vaccine  10/01/2020   Flu Shot  04/30/2022   COVID-19 Vaccine (4 - Pfizer risk series) 08/04/2022*   Hemoglobin A1C  10/23/2022   Yearly kidney health urinalysis for diabetes  12/15/2022   Yearly kidney function blood test for diabetes  02/07/2023   Complete foot exam   04/16/2023   Eye exam for diabetics  07/02/2023   Pneumonia Vaccine  Completed   DEXA scan (bone density measurement)  Completed   HPV Vaccine  Aged Out  *Topic was postponed. The date shown is not the original due date.     Nurse Notes: NONE.

## 2022-07-22 ENCOUNTER — Ambulatory Visit: Payer: Medicare Other

## 2022-07-25 DIAGNOSIS — Z96612 Presence of left artificial shoulder joint: Secondary | ICD-10-CM | POA: Diagnosis not present

## 2022-07-25 DIAGNOSIS — M8580 Other specified disorders of bone density and structure, unspecified site: Secondary | ICD-10-CM | POA: Diagnosis not present

## 2022-07-25 DIAGNOSIS — Z471 Aftercare following joint replacement surgery: Secondary | ICD-10-CM | POA: Diagnosis not present

## 2022-07-25 DIAGNOSIS — E119 Type 2 diabetes mellitus without complications: Secondary | ICD-10-CM | POA: Diagnosis not present

## 2022-07-25 DIAGNOSIS — I10 Essential (primary) hypertension: Secondary | ICD-10-CM | POA: Diagnosis not present

## 2022-07-25 DIAGNOSIS — M810 Age-related osteoporosis without current pathological fracture: Secondary | ICD-10-CM | POA: Diagnosis not present

## 2022-07-29 DIAGNOSIS — Z96612 Presence of left artificial shoulder joint: Secondary | ICD-10-CM | POA: Diagnosis not present

## 2022-08-02 DIAGNOSIS — Z96612 Presence of left artificial shoulder joint: Secondary | ICD-10-CM | POA: Diagnosis not present

## 2022-08-02 DIAGNOSIS — Z471 Aftercare following joint replacement surgery: Secondary | ICD-10-CM | POA: Diagnosis not present

## 2022-08-02 DIAGNOSIS — E119 Type 2 diabetes mellitus without complications: Secondary | ICD-10-CM | POA: Diagnosis not present

## 2022-08-02 DIAGNOSIS — I10 Essential (primary) hypertension: Secondary | ICD-10-CM | POA: Diagnosis not present

## 2022-08-02 DIAGNOSIS — M8580 Other specified disorders of bone density and structure, unspecified site: Secondary | ICD-10-CM | POA: Diagnosis not present

## 2022-08-02 DIAGNOSIS — M810 Age-related osteoporosis without current pathological fracture: Secondary | ICD-10-CM | POA: Diagnosis not present

## 2022-08-09 DIAGNOSIS — K5732 Diverticulitis of large intestine without perforation or abscess without bleeding: Secondary | ICD-10-CM | POA: Diagnosis not present

## 2022-08-09 DIAGNOSIS — M8580 Other specified disorders of bone density and structure, unspecified site: Secondary | ICD-10-CM | POA: Diagnosis not present

## 2022-08-09 DIAGNOSIS — E7800 Pure hypercholesterolemia, unspecified: Secondary | ICD-10-CM | POA: Diagnosis not present

## 2022-08-09 DIAGNOSIS — E119 Type 2 diabetes mellitus without complications: Secondary | ICD-10-CM | POA: Diagnosis not present

## 2022-08-09 DIAGNOSIS — M810 Age-related osteoporosis without current pathological fracture: Secondary | ICD-10-CM | POA: Diagnosis not present

## 2022-08-09 DIAGNOSIS — D649 Anemia, unspecified: Secondary | ICD-10-CM | POA: Diagnosis not present

## 2022-08-09 DIAGNOSIS — K219 Gastro-esophageal reflux disease without esophagitis: Secondary | ICD-10-CM | POA: Diagnosis not present

## 2022-08-09 DIAGNOSIS — I10 Essential (primary) hypertension: Secondary | ICD-10-CM | POA: Diagnosis not present

## 2022-08-09 DIAGNOSIS — Z471 Aftercare following joint replacement surgery: Secondary | ICD-10-CM | POA: Diagnosis not present

## 2022-08-09 DIAGNOSIS — Z90710 Acquired absence of both cervix and uterus: Secondary | ICD-10-CM | POA: Diagnosis not present

## 2022-08-09 DIAGNOSIS — Z9049 Acquired absence of other specified parts of digestive tract: Secondary | ICD-10-CM | POA: Diagnosis not present

## 2022-08-09 DIAGNOSIS — K2270 Barrett's esophagus without dysplasia: Secondary | ICD-10-CM | POA: Diagnosis not present

## 2022-08-09 DIAGNOSIS — Z96612 Presence of left artificial shoulder joint: Secondary | ICD-10-CM | POA: Diagnosis not present

## 2022-08-09 DIAGNOSIS — Z7984 Long term (current) use of oral hypoglycemic drugs: Secondary | ICD-10-CM | POA: Diagnosis not present

## 2022-08-09 DIAGNOSIS — K449 Diaphragmatic hernia without obstruction or gangrene: Secondary | ICD-10-CM | POA: Diagnosis not present

## 2022-08-09 DIAGNOSIS — H40053 Ocular hypertension, bilateral: Secondary | ICD-10-CM | POA: Diagnosis not present

## 2022-08-14 ENCOUNTER — Other Ambulatory Visit: Payer: Self-pay | Admitting: Internal Medicine

## 2022-08-15 NOTE — Telephone Encounter (Signed)
Requested Prescriptions  Pending Prescriptions Disp Refills   FREESTYLE LITE test strip [Pharmacy Med Name: FREESTYLE LITE STRIPS 50'S] 100 strip 3    Sig: USE TO TEST BLOOD SUGAR DAILY     Endocrinology: Diabetes - Testing Supplies Passed - 08/14/2022  4:40 PM      Passed - Valid encounter within last 12 months    Recent Outpatient Visits           3 months ago Essential (primary) hypertension   [place] [facility] at [facility], Jesse Sans, MD   8 months ago Essential (primary) hypertension   [place] [facility] Medicine at [facility], Jesse Sans, MD   1 year ago Type II diabetes mellitus with complication [facility]   [place] [facility] and [facility] Medicine at [facility], Jesse Sans, MD   1 year ago Essential (primary) hypertension   [place] [facility] Medicine at [facility], Jesse Sans, MD   1 year ago Type II diabetes mellitus with complication [facility])    Primary Care and Sports Medicine at [facility], Jesse Sans, MD       Future Appointments             In 1 week Army Melia, Jesse Sans, MD [facility] Primary Care and Sports Medicine at [facility], [facility]   In 4 months Army Melia, Jesse Sans, MD [facility] Primary Care and Sports Medicine at [facility], [facility]

## 2022-08-19 ENCOUNTER — Encounter: Payer: Self-pay | Admitting: Podiatry

## 2022-08-19 ENCOUNTER — Ambulatory Visit (INDEPENDENT_AMBULATORY_CARE_PROVIDER_SITE_OTHER): Payer: Medicare Other | Admitting: Podiatry

## 2022-08-19 DIAGNOSIS — M79674 Pain in right toe(s): Secondary | ICD-10-CM | POA: Diagnosis not present

## 2022-08-19 DIAGNOSIS — B351 Tinea unguium: Secondary | ICD-10-CM | POA: Diagnosis not present

## 2022-08-19 DIAGNOSIS — E118 Type 2 diabetes mellitus with unspecified complications: Secondary | ICD-10-CM

## 2022-08-19 DIAGNOSIS — M79675 Pain in left toe(s): Secondary | ICD-10-CM

## 2022-08-19 DIAGNOSIS — M2041 Other hammer toe(s) (acquired), right foot: Secondary | ICD-10-CM

## 2022-08-19 LAB — HM DIABETES FOOT EXAM: HM Diabetic Foot Exam: NORMAL

## 2022-08-19 NOTE — Progress Notes (Signed)
This patient returns to my office for at risk foot care.  This patient requires this care by a professional since this patient will be at risk due to having diabetes with no complications.  This patient is unable to cut nails herself since the patient cannot reach her nails.These nails are painful walking and wearing shoes.  This patient presents for at risk foot care today.  General Appearance  Alert, conversant and in no acute stress.  Vascular  Dorsalis pedis and posterior tibial  pulses are palpable  bilaterally.  Capillary return is within normal limits  bilaterally. Temperature is within normal limits  bilaterally.  Neurologic  Senn-Weinstein monofilament wire test within normal limits  bilaterally. Muscle power within normal limits bilaterally.  Nails Thick disfigured discolored nails with subungual debris  hallux nails  bilaterally. No evidence of bacterial infection or drainage bilaterally.  Orthopedic  No limitations of motion  feet .  No crepitus or effusions noted.  No bony pathology or digital deformities noted. Contracted fifth toe left foot.  Skin  normotropic skin with no porokeratosis noted bilaterally.  No signs of infections or ulcers noted.     Onychomycosis  Pain in right toes  Pain in left toes  Consent was obtained for treatment procedures.   Mechanical debridement of nails 1-5  bilaterally performed with a nail nipper.  Filed with dremel without incident.    Return office visit    4 months                 Told patient to return for periodic foot care and evaluation due to potential at risk complications.   Gardiner Barefoot DPM

## 2022-08-27 ENCOUNTER — Ambulatory Visit (INDEPENDENT_AMBULATORY_CARE_PROVIDER_SITE_OTHER): Payer: Medicare Other | Admitting: Internal Medicine

## 2022-08-27 ENCOUNTER — Encounter: Payer: Self-pay | Admitting: Internal Medicine

## 2022-08-27 VITALS — BP 130/76 | HR 73 | Ht 61.0 in | Wt 125.0 lb

## 2022-08-27 DIAGNOSIS — E118 Type 2 diabetes mellitus with unspecified complications: Secondary | ICD-10-CM

## 2022-08-27 DIAGNOSIS — I10 Essential (primary) hypertension: Secondary | ICD-10-CM

## 2022-08-27 DIAGNOSIS — H3530 Unspecified macular degeneration: Secondary | ICD-10-CM | POA: Diagnosis not present

## 2022-08-27 DIAGNOSIS — Z23 Encounter for immunization: Secondary | ICD-10-CM

## 2022-08-27 LAB — POCT GLYCOSYLATED HEMOGLOBIN (HGB A1C): Hemoglobin A1C: 5.8 % — AB (ref 4.0–5.6)

## 2022-08-27 NOTE — Progress Notes (Signed)
Date:  08/27/2022   Name:  Lori Harrell   DOB:  1941/02/25   MRN:  [id number]   Chief Complaint: Hypertension and Diabetes  Diabetes She presents for her follow-up diabetic visit. She has type 2 diabetes mellitus. Her disease course has been stable. Pertinent negatives for hypoglycemia include no headaches or tremors. Pertinent negatives for diabetes include no chest pain, no fatigue, no polydipsia and no polyuria. Current diabetic treatment includes oral agent (monotherapy) (metformin once a day).  Hypertension This is a chronic problem. The problem is controlled. Pertinent negatives include no chest pain, headaches, palpitations or shortness of breath. Past treatments include ACE inhibitors and diuretics.    Lab Results  Component Value Date   NA 136 02/06/2022   K 4.3 02/06/2022   CO2 25 02/06/2022   GLUCOSE 96 02/06/2022   BUN 12 02/06/2022   CREATININE 0.65 02/06/2022   CALCIUM 9.5 02/06/2022   EGFR 77 12/14/2021   GFRNONAA >60 02/06/2022   Lab Results  Component Value Date   CHOL 159 12/14/2021   HDL 52 12/14/2021   LDLCALC 91 12/14/2021   TRIG 87 12/14/2021   CHOLHDL 3.1 12/14/2021   Lab Results  Component Value Date   TSH 5.650 (H) 12/14/2021   Lab Results  Component Value Date   HGBA1C 5.8 (A) 08/27/2022   Lab Results  Component Value Date   WBC 6.1 02/06/2022   HGB 12.2 02/06/2022   HCT 37.6 02/06/2022   MCV 93.3 02/06/2022   PLT 210 02/06/2022   Lab Results  Component Value Date   ALT 20 02/06/2022   AST 23 02/06/2022   ALKPHOS 54 02/06/2022   BILITOT 0.7 02/06/2022   Lab Results  Component Value Date   VD25OH 61.0 12/14/2021     Review of Systems  Constitutional:  Negative for appetite change, fatigue, fever and unexpected weight change.  HENT:  Negative for tinnitus and trouble swallowing.   Eyes:  Negative for visual disturbance.  Respiratory:  Negative for cough, chest tightness and shortness of breath.   Cardiovascular:   Negative for chest pain, palpitations and leg swelling.  Gastrointestinal:  Negative for abdominal pain.  Endocrine: Negative for polydipsia and polyuria.  Genitourinary:  Negative for dysuria and hematuria.  Musculoskeletal:  Positive for arthralgias (left shoulder).  Neurological:  Negative for tremors, numbness and headaches.  Psychiatric/Behavioral:  Negative for dysphoric mood.     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   Basal cell carcinoma (BCC) of skin of lip 05/31/2021   Elevated TSH 12/15/2020   S/P lumbar fusion 05/19/2019   Red blood cell antibody positive 02/03/2019   DDD (degenerative disc disease), lumbar 01/07/2019   Carotid artery stenosis, asymptomatic, bilateral 07/07/2017   Primary open angle glaucoma 05/08/2017   Old partial retinal detachment 05/08/2017   Large hiatal hernia 07/04/2016   Sigmoid diverticulitis 07/04/2016   Stress incontinence 07/04/2016   Type II diabetes mellitus with complication ([place]) 03/49/1791   Constipation 10/31/2015   Barrett's esophagus without dysplasia 06/16/2015   Essential (primary) hypertension 06/16/2015   Hyperlipidemia associated with type 2 diabetes mellitus ([place]) 06/16/2015   OP (osteoporosis) 06/16/2015   Hypercalcemia 04/22/2012    No Known Allergies  Past Surgical History:  Procedure Laterality Date   ANTERIOR LATERAL LUMBAR FUSION WITH PERCUTANEOUS SCREW 2 LEVEL N/A 05/19/2019   Procedure: L2-4 LATERAL LUMBAR INTERBODY FUSION (XLIF);  Surgeon: Meade Maw, MD;  Location: [facility]  ORS;  Service: Neurosurgery;  Laterality: N/A;   CATARACT EXTRACTION Bilateral    CHOLECYSTECTOMY, LAPAROSCOPIC  02/05/2019   [facility]   COLONOSCOPY  2007   sigmoid diverticulosis   ENDOSCOPIC RETROGRADE CHOLANGIOPANCREATOGRAPHY (ERCP) WITH PROPOFOL N/A 02/02/2019   Procedure: ENDOSCOPIC RETROGRADE CHOLANGIOPANCREATOGRAPHY (ERCP) WITH PROPOFOL;  Surgeon: Lucilla Lame, MD;   Location: [facility] ENDOSCOPY;  Service: Endoscopy;  Laterality: N/A;   ESOPHAGOGASTRODUODENOSCOPY  04/22/2016   ESOPHAGOGASTRODUODENOSCOPY  04/26/2019   ESOPHAGOGASTRODUODENOSCOPY  09/15/2019   REVERSE SHOULDER ARTHROPLASTY Left 02/18/2022   Procedure: Left reverse shoulder arthroplasty, biceps tenodesis;  Surgeon: Leim Fabry, MD;  Location: [facility] ORS;  Service: Orthopedics;  Laterality: Left;   TONSILLECTOMY     UPPER GASTROINTESTINAL ENDOSCOPY  02/2013   Barrett's - done at [place]      Social History   Tobacco Use   Smoking status: Never   Smokeless tobacco: Never   Tobacco comments:    smoking cessation materials not required  Vaping Use   Vaping Use: Never used  Substance Use Topics   Alcohol use: No    Alcohol/week: 0.0 standard drinks of alcohol   Drug use: No     Medication list has been reviewed and updated.  Current Meds  Medication Sig   acetaminophen (TYLENOL) 500 MG tablet Take 1,000 mg by mouth 2 (two) times daily.   atorvastatin (LIPITOR) 20 MG tablet TAKE 1 TABLET DAILY   Calcium-Magnesium-Vitamin D (CALCIUM 1200+D3 PO) Take 1 tablet by mouth every evening.   dorzolamide-timolol (COSOPT) 22.3-6.8 MG/ML ophthalmic solution Place 1 drop into both eyes 2 (two) times daily.   enalapril (VASOTEC) 20 MG tablet TAKE 1 TABLET DAILY   esomeprazole (NEXIUM) 40 MG capsule TAKE 1 CAPSULE TWICE A DAY   FREESTYLE LITE test strip USE TO TEST BLOOD SUGAR DAILY   Lancets (FREESTYLE) lancets USE 1 LANCET DAILY   latanoprost (XALATAN) 0.005 % ophthalmic solution Place 1 drop into both eyes at bedtime.   Menthol, Topical Analgesic, (BIOFREEZE) 10 % LIQD Apply 1 application. topically 3 (three) times daily as needed (pain.).   metFORMIN (GLUCOPHAGE) 500 MG tablet TAKE 1 TABLET DAILY   Multiple Vitamin (MULTIVITAMIN WITH MINERALS) TABS tablet Take 1 tablet by mouth in the morning. Women's Centrum Silver   Multiple Vitamins-Minerals (PRESERVISION AREDS 2 PO)  Take by mouth 2 (two) times daily.   polycarbophil (FIBERCON) 625 MG tablet Take 625 mg by mouth daily.   spironolactone (ALDACTONE) 25 MG tablet TAKE 1 TABLET DAILY   [DISCONTINUED] polyethylene glycol powder (GLYCOLAX/MIRALAX) 17 GM/SCOOP powder Take by mouth.   [DISCONTINUED] Study - CAPTIVA - aspirin 81 mg tablet (PI-Sethi) Chew 1 tablet by mouth daily.       08/27/2022    9:29 AM 04/22/2022    9:44 AM 12/14/2021    9:23 AM 05/31/2021    8:47 AM  GAD 7 : Generalized Anxiety Score  Nervous, Anxious, on Edge 0 0 0 0  Control/stop worrying 0 0 0 0  Worry too much - different things 0 0 0 0  Trouble relaxing 0 0 0 0  Restless 0 0 0 0  Easily annoyed or irritable 0 0 0 0  Afraid - awful might happen 0 0 0 0  Total GAD 7 Score 0 0 0 0  Anxiety Difficulty Not difficult at all Not difficult at all Not difficult at all        08/27/2022    9:29 AM 07/19/2022   10:49 AM 04/22/2022  9:44 AM  Depression screen PHQ 2/9  Decreased Interest 1 0 0  Down, Depressed, Hopeless 1 0 0  PHQ - 2 Score 2 0 0  Altered sleeping _0 Tired, decreased energy _1 Change in appetite 0 0 0  Feeling bad or failure about yourself  0 0 0  Trouble concentrating 0 0 0  Moving slowly or fidgety/restless 0 0 0  Suicidal thoughts 0 0 0  PHQ-9 Score _2 Difficult doing work/chores Not difficult at all Not difficult at all Not difficult at all    BP Readings from Last 3 Encounters:  08/27/22 130/76  04/30/22 (!) 161/94  04/22/22 132/70    Physical Exam Vitals and nursing note reviewed.  Constitutional:      General: She is not in acute distress.    Appearance: She is well-developed.  HENT:     Head: Normocephalic and atraumatic.  Cardiovascular:     Rate and Rhythm: Normal rate and regular rhythm.     Pulses: Normal pulses.  Pulmonary:     Effort: Pulmonary effort is normal. No respiratory distress.     Breath sounds: No wheezing or rhonchi.  Musculoskeletal:     Right lower leg: No  edema.     Left lower leg: No edema.     Comments: Limited ROM left shoulder  Lymphadenopathy:     Cervical: No cervical adenopathy.  Skin:    General: Skin is warm and dry.     Findings: No rash.  Neurological:     Mental Status: She is alert and oriented to person, place, and time.  Psychiatric:        Mood and Affect: Mood normal.        Behavior: Behavior normal.     Wt Readings from Last 3 Encounters:  08/27/22 125 lb (56.7 kg)  07/19/22 126 lb (57.2 kg)  04/30/22 126 lb (57.2 kg)    BP 130/76 (BP Location: Left Arm)   Pulse 73   Ht 5' 1" (1.549 m)   Wt 125 lb (56.7 kg)   SpO2 99%   BMI 23.62 kg/m   Assessment and Plan: 1. Type II diabetes mellitus with complication (HCC) Clinically stable by exam and report without s/s of hypoglycemia. DM complicated by hypertension and dyslipidemia. Tolerating medications well without side effects or other concerns - metformin 500 mg once a day - POCT glycosylated hemoglobin (Hb A1C) = 5.8  2. Essential (primary) hypertension Clinically stable exam with well controlled BP. Tolerating medications without side effects at this time. Pt to continue current regimen and low sodium diet; benefits of regular exercise as able discussed.  3. Macular degeneration of left eye, unspecified type New finding from [facility]  4. Need for immunization against influenza - Flu Vaccine QUAD High Dose(Fluad)   Partially dictated using Editor, commissioning. Any errors are unintentional.  Halina Maidens, MD [facility]  08/27/2022

## 2022-09-04 DIAGNOSIS — E119 Type 2 diabetes mellitus without complications: Secondary | ICD-10-CM | POA: Diagnosis not present

## 2022-09-04 DIAGNOSIS — Z471 Aftercare following joint replacement surgery: Secondary | ICD-10-CM | POA: Diagnosis not present

## 2022-09-04 DIAGNOSIS — M810 Age-related osteoporosis without current pathological fracture: Secondary | ICD-10-CM | POA: Diagnosis not present

## 2022-09-04 DIAGNOSIS — Z96612 Presence of left artificial shoulder joint: Secondary | ICD-10-CM | POA: Diagnosis not present

## 2022-09-04 DIAGNOSIS — I10 Essential (primary) hypertension: Secondary | ICD-10-CM | POA: Diagnosis not present

## 2022-09-04 DIAGNOSIS — M8580 Other specified disorders of bone density and structure, unspecified site: Secondary | ICD-10-CM | POA: Diagnosis not present

## 2022-09-09 DIAGNOSIS — Z96612 Presence of left artificial shoulder joint: Secondary | ICD-10-CM | POA: Diagnosis not present

## 2022-09-23 ENCOUNTER — Other Ambulatory Visit: Payer: Self-pay | Admitting: Internal Medicine

## 2022-09-23 DIAGNOSIS — I10 Essential (primary) hypertension: Secondary | ICD-10-CM

## 2022-09-27 ENCOUNTER — Other Ambulatory Visit: Payer: Self-pay | Admitting: Internal Medicine

## 2022-09-27 DIAGNOSIS — K2270 Barrett's esophagus without dysplasia: Secondary | ICD-10-CM

## 2022-09-27 NOTE — Telephone Encounter (Signed)
Medication Refill - Medication:  enalapril (VASOTEC) 20 MG tablet  Has the patient contacted their pharmacy? No. (Agent: If no, request that the patient contact the pharmacy for the refill. If patient does not wish to contact the pharmacy document the reason why and proceed with request.) (Agent: If yes, when and what did the pharmacy advise?)  Preferred Pharmacy (with phone number or street name):  [facility] [address] Phone: [phone]  Fax: [phone]      Has the patient been seen for an appointment in the last year OR does the patient have an upcoming appointment? Yes.    Agent: Please be advised that RX refills may take up to 3 business days. We ask that you follow-up with your pharmacy.

## 2022-09-28 NOTE — Telephone Encounter (Signed)
Requested medication (s) are due for refill today: yes  Requested medication (s) are on the active medication list: yes  Last refill:  07/04/22 #90/0  Future visit scheduled: yes  Notes to clinic:  Unable to refill per protocol due to failed labs, no updated results.      Requested Prescriptions  Pending Prescriptions Disp Refills   enalapril (VASOTEC) 20 MG tablet 90 tablet 0    Sig: Take 1 tablet (20 mg total) by mouth daily.     Cardiovascular:  ACE Inhibitors Failed - 09/27/2022  4:23 PM      Failed - Cr in normal range and within 180 days    Creatinine, Ser  Date Value Ref Range Status  02/06/2022 0.65 0.44 - 1.00 mg/dL Final         Failed - K in normal range and within 180 days    Potassium  Date Value Ref Range Status  02/06/2022 4.3 3.5 - 5.1 mmol/L Final         Passed - Patient is not pregnant      Passed - Last BP in normal range    BP Readings from Last 1 Encounters:  08/27/22 130/76         Passed - Valid encounter within last 6 months    Recent Outpatient Visits           1 month ago Type II diabetes mellitus with complication [facility])   [place] [facility] Medicine at [facility], Jesse Sans, MD   5 months ago Essential (primary) hypertension   [place] [facility] and [facility] Medicine at [facility], Jesse Sans, MD   9 months ago Essential (primary) hypertension   [place] [facility] and [facility] Medicine at [facility], Jesse Sans, MD   1 year ago Type II diabetes mellitus with complication [facility])   [place] [facility] Care and Sports Medicine at [facility], Jesse Sans, MD   1 year ago Essential (primary) hypertension    Primary Care and Sports Medicine at [facility], Jesse Sans, MD       Future Appointments             In 2 months Army Melia, Jesse Sans, MD [place] [facility] Care and Sports Medicine at [facility], [facility]

## 2022-10-01 MED ORDER — ENALAPRIL MALEATE 20 MG PO TABS: 20.0000 mg | ORAL_TABLET | Freq: Every day | ORAL | 0 refills | Status: DC

## 2022-10-04 ENCOUNTER — Ambulatory Visit (INDEPENDENT_AMBULATORY_CARE_PROVIDER_SITE_OTHER): Payer: Medicare Other | Admitting: Internal Medicine

## 2022-10-04 ENCOUNTER — Encounter: Payer: Self-pay | Admitting: Internal Medicine

## 2022-10-04 VITALS — BP 138/84 | HR 87 | Temp 97.8°F | Ht 61.0 in | Wt 125.0 lb

## 2022-10-04 DIAGNOSIS — B370 Candidal stomatitis: Secondary | ICD-10-CM | POA: Diagnosis not present

## 2022-10-04 DIAGNOSIS — I10 Essential (primary) hypertension: Secondary | ICD-10-CM | POA: Diagnosis not present

## 2022-10-04 DIAGNOSIS — J069 Acute upper respiratory infection, unspecified: Secondary | ICD-10-CM

## 2022-10-04 MED ORDER — NYSTATIN 100000 UNIT/ML MT SUSP: OROMUCOSAL | 0 refills | Status: DC

## 2022-10-04 MED ORDER — LISINOPRIL 20 MG PO TABS: 20.0000 mg | ORAL_TABLET | Freq: Every day | ORAL | 3 refills | Status: DC

## 2022-10-04 NOTE — Assessment & Plan Note (Signed)
BP controlled but Enalapril not available Will switch to lisinopril 20 mg.

## 2022-10-04 NOTE — Progress Notes (Signed)
Date:  10/04/2022   Name:  Lori Harrell   DOB:  July 19, 1941   MRN:  [id number]   Chief Complaint: Cough  Cough This is a new problem. Episode onset: 4-5 days. The problem has been unchanged. The cough is Non-productive. Associated symptoms include chills, headaches and rhinorrhea. Pertinent negatives include no chest pain, fever, nasal congestion, postnasal drip, sore throat, shortness of breath or wheezing.  Took Mucinex DM last night with some improvement.  Tongue feels sore and looks red.  No recent antibiotics.    Lab Results  Component Value Date   NA 136 02/06/2022   K 4.3 02/06/2022   CO2 25 02/06/2022   GLUCOSE 96 02/06/2022   BUN 12 02/06/2022   CREATININE 0.65 02/06/2022   CALCIUM 9.5 02/06/2022   EGFR 77 12/14/2021   GFRNONAA >60 02/06/2022   Lab Results  Component Value Date   CHOL 159 12/14/2021   HDL 52 12/14/2021   LDLCALC 91 12/14/2021   TRIG 87 12/14/2021   CHOLHDL 3.1 12/14/2021   Lab Results  Component Value Date   TSH 5.650 (H) 12/14/2021   Lab Results  Component Value Date   HGBA1C 5.8 (A) 08/27/2022   Lab Results  Component Value Date   WBC 6.1 02/06/2022   HGB 12.2 02/06/2022   HCT 37.6 02/06/2022   MCV 93.3 02/06/2022   PLT 210 02/06/2022   Lab Results  Component Value Date   ALT 20 02/06/2022   AST 23 02/06/2022   ALKPHOS 54 02/06/2022   BILITOT 0.7 02/06/2022   Lab Results  Component Value Date   VD25OH 61.0 12/14/2021     Review of Systems  Constitutional:  Positive for chills. Negative for fatigue and fever.  HENT:  Positive for mouth sores and rhinorrhea. Negative for postnasal drip and sore throat.   Respiratory:  Positive for cough. Negative for shortness of breath and wheezing.   Cardiovascular:  Negative for chest pain and palpitations.  Gastrointestinal:  Negative for diarrhea, nausea and vomiting.  Neurological:  Positive for headaches. Negative for dizziness.    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   Basal cell carcinoma (BCC) of skin of lip 05/31/2021   Elevated TSH 12/15/2020   S/P lumbar fusion 05/19/2019   Red blood cell antibody positive 02/03/2019   DDD (degenerative disc disease), lumbar 01/07/2019   Carotid artery stenosis, asymptomatic, bilateral 07/07/2017   Primary open angle glaucoma 05/08/2017   Old partial retinal detachment 05/08/2017   Large hiatal hernia 07/04/2016   Sigmoid diverticulitis 07/04/2016   Stress incontinence 07/04/2016   Type II diabetes mellitus with complication ([place]) 82/50/5397   Constipation 10/31/2015   Barrett's esophagus without dysplasia 06/16/2015   Essential (primary) hypertension 06/16/2015   Hyperlipidemia associated with type 2 diabetes mellitus ([place]) 06/16/2015   OP (osteoporosis) 06/16/2015   Hypercalcemia 04/22/2012    No Known Allergies  Past Surgical History:  Procedure Laterality Date   ANTERIOR LATERAL LUMBAR FUSION WITH PERCUTANEOUS SCREW 2 LEVEL N/A 05/19/2019   Procedure: L2-4 LATERAL LUMBAR INTERBODY FUSION (XLIF);  Surgeon: Meade Maw, MD;  Location: [facility] ORS;  Service: Neurosurgery;  Laterality: N/A;   CATARACT EXTRACTION Bilateral    CHOLECYSTECTOMY, LAPAROSCOPIC  02/05/2019   [facility]   COLONOSCOPY  2007   sigmoid diverticulosis   ENDOSCOPIC RETROGRADE CHOLANGIOPANCREATOGRAPHY (ERCP) WITH PROPOFOL N/A 02/02/2019   Procedure: ENDOSCOPIC RETROGRADE CHOLANGIOPANCREATOGRAPHY (  ERCP) WITH PROPOFOL;  Surgeon: Lucilla Lame, MD;  Location: [facility] ENDOSCOPY;  Service: Endoscopy;  Laterality: N/A;   ESOPHAGOGASTRODUODENOSCOPY  04/22/2016   ESOPHAGOGASTRODUODENOSCOPY  04/26/2019   ESOPHAGOGASTRODUODENOSCOPY  09/15/2019   REVERSE SHOULDER ARTHROPLASTY Left 02/18/2022   Procedure: Left reverse shoulder arthroplasty, biceps tenodesis;  Surgeon: Leim Fabry, MD;  Location: [facility] ORS;  Service: Orthopedics;  Laterality:  Left;   TONSILLECTOMY     UPPER GASTROINTESTINAL ENDOSCOPY  02/2013   Barrett's - done at [place]      Social History   Tobacco Use   Smoking status: Never   Smokeless tobacco: Never   Tobacco comments:    smoking cessation materials not required  Vaping Use   Vaping Use: Never used  Substance Use Topics   Alcohol use: No    Alcohol/week: 0.0 standard drinks of alcohol   Drug use: No     Medication list has been reviewed and updated.  Current Meds  Medication Sig   acetaminophen (TYLENOL) 500 MG tablet Take 1,000 mg by mouth 2 (two) times daily.   atorvastatin (LIPITOR) 20 MG tablet TAKE 1 TABLET DAILY   Calcium-Magnesium-Vitamin D (CALCIUM 1200+D3 PO) Take 1 tablet by mouth every evening.   dextromethorphan-guaiFENesin (MUCINEX DM) 30-600 MG 12hr tablet Take 1 tablet by mouth 2 (two) times daily.   dorzolamide-timolol (COSOPT) 22.3-6.8 MG/ML ophthalmic solution Place 1 drop into both eyes 2 (two) times daily.   esomeprazole (NEXIUM) 40 MG capsule TAKE 1 CAPSULE TWICE A DAY   FREESTYLE LITE test strip USE TO TEST BLOOD SUGAR DAILY   Lancets (FREESTYLE) lancets USE 1 LANCET DAILY   latanoprost (XALATAN) 0.005 % ophthalmic solution Place 1 drop into both eyes at bedtime.   lisinopril (ZESTRIL) 20 MG tablet Take 1 tablet (20 mg total) by mouth daily.   Menthol, Topical Analgesic, (BIOFREEZE) 10 % LIQD Apply 1 application. topically 3 (three) times daily as needed (pain.).   metFORMIN (GLUCOPHAGE) 500 MG tablet TAKE 1 TABLET DAILY   Multiple Vitamin (MULTIVITAMIN WITH MINERALS) TABS tablet Take 1 tablet by mouth in the morning. Women's Centrum Silver   Multiple Vitamins-Minerals (PRESERVISION AREDS 2 PO) Take by mouth 2 (two) times daily.   nystatin (MYCOSTATIN) 100000 UNIT/ML suspension 1 tsp - swish, gargle and spit 4 times a day   polycarbophil (FIBERCON) 625 MG tablet Take 625 mg by mouth daily.   spironolactone (ALDACTONE) 25 MG tablet TAKE 1 TABLET  DAILY   [DISCONTINUED] enalapril (VASOTEC) 20 MG tablet Take 1 tablet (20 mg total) by mouth daily.       10/04/2022   10:22 AM 08/27/2022    9:29 AM 04/22/2022    9:44 AM 12/14/2021    9:23 AM  GAD 7 : Generalized Anxiety Score  Nervous, Anxious, on Edge 0 0 0 0  Control/stop worrying 0 0 0 0  Worry too much - different things 0 0 0 0  Trouble relaxing 0 0 0 0  Restless 0 0 0 0  Easily annoyed or irritable 0 0 0 0  Afraid - awful might happen 0 0 0 0  Total GAD 7 Score 0 0 0 0  Anxiety Difficulty Not difficult at all Not difficult at all Not difficult at all Not difficult at all       10/04/2022   10:22 AM 08/27/2022    9:29 AM 07/19/2022   10:49 AM  Depression screen PHQ 2/9  Decreased Interest 1 1 0  Down, Depressed, Hopeless  1 1 0  PHQ - 2 Score 2 2 0  Altered sleeping '1 1 2  '$ Tired, decreased energy '1 1 2  '$ Change in appetite 1 0 0  Feeling bad or failure about yourself  0 0 0  Trouble concentrating 0 0 0  Moving slowly or fidgety/restless 0 0 0  Suicidal thoughts 0 0 0  PHQ-9 Score '5 4 4  '$ Difficult doing work/chores Not difficult at all Not difficult at all Not difficult at all    BP Readings from Last 3 Encounters:  10/04/22 138/84  08/27/22 130/76  04/30/22 (!) 161/94    Physical Exam Constitutional:      Appearance: Normal appearance.  HENT:     Right Ear: Tympanic membrane normal.     Left Ear: Tympanic membrane normal.     Nose:     Right Sinus: No maxillary sinus tenderness or frontal sinus tenderness.     Left Sinus: No maxillary sinus tenderness or frontal sinus tenderness.     Mouth/Throat:     Pharynx: Oropharynx is clear.     Comments: Tongue red with white coating Cardiovascular:     Rate and Rhythm: Normal rate and regular rhythm.  Pulmonary:     Effort: Pulmonary effort is normal.     Breath sounds: No wheezing or rhonchi.  Musculoskeletal:     Cervical back: Normal range of motion.  Lymphadenopathy:     Cervical: No cervical adenopathy.   Neurological:     Mental Status: She is alert.     Wt Readings from Last 3 Encounters:  10/04/22 125 lb (56.7 kg)  08/27/22 125 lb (56.7 kg)  07/19/22 126 lb (57.2 kg)    BP 138/84   Pulse 87   Temp 97.8 F (36.6 C) (Oral)   Ht '5\' 1"'$  (1.549 m)   Wt 125 lb (56.7 kg)   SpO2 97%   BMI 23.62 kg/m   Assessment and Plan: 1. Upper respiratory tract infection, unspecified type Likely viral and improving Continue fluids, Mucinex DM, rest Follow up if worsening  2. Thrush - nystatin (MYCOSTATIN) 100000 UNIT/ML suspension; 1 tsp - swish, gargle and spit 4 times a day  Dispense: 60 mL; Refill: 0  3. Essential (primary) hypertension Enalapril not available at [facility] Will send in Lisinopril instead. - lisinopril (ZESTRIL) 20 MG tablet; Take 1 tablet (20 mg total) by mouth daily.  Dispense: 90 tablet; Refill: 3   Partially dictated using Editor, commissioning. Any errors are unintentional.  Halina Maidens, MD [facility]  10/04/2022

## 2022-10-29 ENCOUNTER — Other Ambulatory Visit: Payer: Self-pay

## 2022-10-29 ENCOUNTER — Emergency Department
Admission: EM | Admit: 2022-10-29 | Discharge: 2022-10-29 | Disposition: A | Discharge status: Home / Self Care | Payer: Medicare Other | Attending: Emergency Medicine | Admitting: Emergency Medicine

## 2022-10-29 ENCOUNTER — Encounter: Payer: Self-pay | Admitting: Emergency Medicine

## 2022-10-29 ENCOUNTER — Emergency Department: Payer: Medicare Other

## 2022-10-29 DIAGNOSIS — S52502A Unspecified fracture of the lower end of left radius, initial encounter for closed fracture: Secondary | ICD-10-CM | POA: Diagnosis not present

## 2022-10-29 DIAGNOSIS — I10 Essential (primary) hypertension: Secondary | ICD-10-CM | POA: Diagnosis not present

## 2022-10-29 DIAGNOSIS — S52692A Other fracture of lower end of left ulna, initial encounter for closed fracture: Secondary | ICD-10-CM

## 2022-10-29 DIAGNOSIS — S52592A Other fractures of lower end of left radius, initial encounter for closed fracture: Secondary | ICD-10-CM | POA: Insufficient documentation

## 2022-10-29 DIAGNOSIS — Y9389 Activity, other specified: Secondary | ICD-10-CM | POA: Diagnosis not present

## 2022-10-29 DIAGNOSIS — M7989 Other specified soft tissue disorders: Secondary | ICD-10-CM | POA: Diagnosis not present

## 2022-10-29 DIAGNOSIS — S52615A Nondisplaced fracture of left ulna styloid process, initial encounter for closed fracture: Secondary | ICD-10-CM | POA: Insufficient documentation

## 2022-10-29 DIAGNOSIS — E119 Type 2 diabetes mellitus without complications: Secondary | ICD-10-CM | POA: Diagnosis not present

## 2022-10-29 DIAGNOSIS — W010XXA Fall on same level from slipping, tripping and stumbling without subsequent striking against object, initial encounter: Secondary | ICD-10-CM | POA: Insufficient documentation

## 2022-10-29 DIAGNOSIS — S52602A Unspecified fracture of lower end of left ulna, initial encounter for closed fracture: Secondary | ICD-10-CM | POA: Diagnosis not present

## 2022-10-29 DIAGNOSIS — S59912A Unspecified injury of left forearm, initial encounter: Secondary | ICD-10-CM | POA: Diagnosis present

## 2022-10-29 MED ORDER — OXYCODONE-ACETAMINOPHEN 5-325 MG PO TABS: 1.0000 | ORAL_TABLET | Freq: Three times a day (TID) | ORAL | 0 refills | Status: AC | PRN

## 2022-10-29 NOTE — Discharge Instructions (Addendum)
-  Your x-ray shows that you have some small fractures in your forearm near your wrist.  Please keep the splint on until you can be reevaluated by orthopedics within the next 1 to 2 weeks.  -For the pain, you may take Tylenol/ibuprofen.  If needed, you may utilize the oxycodone as well, the use caution as may make you dizzy/drowsy.  -Return to the emergency department anytime if you begin to experience any new or worsening symptoms.

## 2022-10-29 NOTE — ED Provider Notes (Signed)
[facility] Provider Note    Event Date/Time   First MD Initiated Contact with Patient 10/29/22 1339     (approximate)   History   Chief Complaint Arm Injury   HPI Lori Harrell is a 82 y.o. female, history of type 2 diabetes, hyperlipidemia, hypertension, osteoporosis, presents to the emergency department for evaluation of left wrist/forearm injury.  Patient states that she was carrying groceries inside when she felt like the groceries were causing her to lose her balance.  She subsequently fell and tried to brace herself with her left hand.  She is now endorsing pain in the left wrist/distal forearm.  Denies any other injuries.  Denies paresthesias, cold sensation in the affected extremity, chest pain, shortness of breath, pain, nausea/vomiting, diarrhea, urinary symptoms, dizziness/lightheadedness, or restless lesions.  History Limitations: No limitations.        Physical Exam  Triage Vital Signs: ED Triage Vitals [10/29/22 1240]  Enc Vitals Group     BP (!) 171/94     Pulse Rate 69     Resp 16     Temp 97.8 F (36.6 C)     Temp Source Oral     SpO2 100 %     Weight 125 lb (56.7 kg)     Height '5\' 1"'$  (1.549 m)     Head Circumference      Peak Flow      Pain Score 8     Pain Loc      Pain Edu?      Excl. in [place]?     Most recent vital signs: Vitals:   10/29/22 1240 10/29/22 1434  BP: (!) 171/94 (!) 161/85  Pulse: 69   Resp: 16 18  Temp: 97.8 F (36.6 C)   SpO2: 100%     General: Awake, NAD.  Skin: Warm, dry. No rashes or lesions.  Eyes: PERRL. Conjunctivae normal.  CV: Good peripheral perfusion.  Resp: Normal effort.  Abd: Soft, non-tender. No distention.  Neuro: At baseline. No gross neurological deficits.  Musculoskeletal: Normal ROM of all extremities.  Focused Exam: Mild swelling appreciated in the left wrist/distal forearm.  Diffuse tenderness.  No overlying warmth or erythema.  Normal range of motion of the  wrist/hands.  PMS intact distally.  Normal pulses.  No tenderness in the proximal radius/ulna, elbow, humerus, or left shoulder.  No snuffbox tenderness.  Physical Exam    ED Results / Procedures / Treatments  Labs (all labs ordered are listed, but only abnormal results are displayed) Labs Reviewed - No data to display   EKG N/A.    RADIOLOGY  ED Provider Interpretation: I personally reviewed and interpreted these x-rays, slightly impacted fracture deformity of the distal radial metaphysis with a nondisplaced ulnar styloid fracture.  No other acute abnormalities.  DG Wrist Complete Left  Result Date: 10/29/2022 CLINICAL DATA:  Fall. EXAM: LEFT WRIST - COMPLETE 3+ VIEW COMPARISON:  None Available. FINDINGS: There is a slightly impacted fracture deformity involving the distal radial metaphysis. Nondisplaced ulnar styloid fracture is also noted. No additional fracture or dislocation. IMPRESSION: 1. Slightly impacted fracture deformity of the distal radial metaphysis. 2. Nondisplaced ulnar styloid fracture. Electronically Signed   By: Kerby Moors M.D.   On: 10/29/2022 14:09   DG Forearm Left  Result Date: 10/29/2022 CLINICAL DATA:  Fall, left wrist and forearm pain EXAM: LEFT FOREARM - 2 VIEW COMPARISON:  None Available. FINDINGS: Transverse fracture through the distal radial metaphysis, which is mildly  impacted but in near anatomic alignment. The fracture does not appear to extend to the articular surface. Soft tissue swelling about the wrist. No foreign body. IMPRESSION: Transverse fracture through the distal radial metaphysis. No additional fracture is seen, however the views obtained are not sufficient for thorough evaluation of the carpal bones; if there is concern for carpal bone fracture, a dedicated wrist series is recommended. Electronically Signed   By: Merilyn Baba M.D.   On: 10/29/2022 13:24    PROCEDURES:  Critical Care performed: N/A.  Procedures    MEDICATIONS  ORDERED IN ED: Medications - No data to display   IMPRESSION / MDM / [place] / ED COURSE  I reviewed the triage vital signs and the nursing notes.                              Differential diagnosis includes, but is not limited to, distal radius fracture, distal ulna fracture, carpal dislocation, carpal fracture,  Assessment/Plan Patient presents with left wrist/forearm pain following mechanical fall.  No signs of infection on physical exam.  X-ray shows slightly impacted fracture deformity of the distal radial metaphysis with a nondisplaced ulnar styloid fracture.  No other acute abnormalities.  No signs of neurovascular injuries.  No other injuries present on exam.  Placed her in a sugar-tong splint and provide her with a sling.  In addition provided her with a prescription for oxycodone/acetaminophen for the pain.  Advised her to follow-up with orthopedics within the next few weeks for reevaluation.  She was amenable to this plan.  Will discharge.  Provided the patient with anticipatory guidance, return precautions, and educational material. Encouraged the patient to return to the emergency department at any time if they begin to experience any new or worsening symptoms. Patient expressed understanding and agreed with the plan.   Patient's presentation is most consistent with acute complicated illness / injury requiring diagnostic workup.       FINAL CLINICAL IMPRESSION(S) / ED DIAGNOSES   Final diagnoses:  Other closed fracture of distal end of left radius, initial encounter  Other closed fracture of distal end of left ulna, initial encounter     Rx / DC Orders   ED Discharge Orders          Ordered    oxyCODONE-acetaminophen (PERCOCET/ROXICET) 5-325 MG tablet  Every 8 hours PRN        10/29/22 1422             Note:  This document was prepared using Dragon voice recognition software and may include unintentional dictation errors.   Teodoro Spray,  [place] 10/29/22 1640    Blake Divine, MD 10/29/22 2252

## 2022-10-29 NOTE — ED Triage Notes (Signed)
Pt states that she tripped and fell while carrying groceries inside, pt is having pain in her left wrist and forearm

## 2022-10-31 DIAGNOSIS — M87012 Idiopathic aseptic necrosis of left shoulder: Secondary | ICD-10-CM | POA: Diagnosis not present

## 2022-10-31 DIAGNOSIS — S52602A Unspecified fracture of lower end of left ulna, initial encounter for closed fracture: Secondary | ICD-10-CM | POA: Diagnosis not present

## 2022-10-31 DIAGNOSIS — S52502A Unspecified fracture of the lower end of left radius, initial encounter for closed fracture: Secondary | ICD-10-CM | POA: Diagnosis not present

## 2022-10-31 DIAGNOSIS — E118 Type 2 diabetes mellitus with unspecified complications: Secondary | ICD-10-CM | POA: Diagnosis not present

## 2022-11-06 ENCOUNTER — Telehealth: Payer: Self-pay

## 2022-11-06 DIAGNOSIS — S52602D Unspecified fracture of lower end of left ulna, subsequent encounter for closed fracture with routine healing: Secondary | ICD-10-CM | POA: Diagnosis not present

## 2022-11-06 DIAGNOSIS — S52502D Unspecified fracture of the lower end of left radius, subsequent encounter for closed fracture with routine healing: Secondary | ICD-10-CM | POA: Diagnosis not present

## 2022-11-06 NOTE — Telephone Encounter (Signed)
        Patient  visited [facility] on 10/29/2022  for arm injury.   Telephone encounter attempt :  1st  A HIPAA compliant voice message was left requesting a return call.  Instructed patient to call back at [phone].   [place] Resource Care Guide   [email or web address]  ?? [phone]   Website: [email or web address]  [email or web address]

## 2022-11-11 DIAGNOSIS — Z96612 Presence of left artificial shoulder joint: Secondary | ICD-10-CM | POA: Diagnosis not present

## 2022-11-12 ENCOUNTER — Telehealth: Payer: Self-pay

## 2022-11-12 NOTE — Telephone Encounter (Signed)
     Patient  visit on 10/29/2022  at [facility] was for arm injury.  Have you been able to follow up with your primary care physician? Yes  The patient was or was not able to obtain any needed medicine or equipment. Patient was able to obtain medication.   Are there diet recommendations that you are having difficulty following? No  Patient expresses understanding of discharge instructions and education provided has no other needs at this time. Yes   [place] Resource Care Guide   [email or web address]  ?? [phone]   Website: [email or web address]

## 2022-11-14 DIAGNOSIS — S52602D Unspecified fracture of lower end of left ulna, subsequent encounter for closed fracture with routine healing: Secondary | ICD-10-CM | POA: Diagnosis not present

## 2022-11-14 DIAGNOSIS — S52502D Unspecified fracture of the lower end of left radius, subsequent encounter for closed fracture with routine healing: Secondary | ICD-10-CM | POA: Diagnosis not present

## 2022-11-14 DIAGNOSIS — M25532 Pain in left wrist: Secondary | ICD-10-CM | POA: Diagnosis not present

## 2022-11-22 DIAGNOSIS — L570 Actinic keratosis: Secondary | ICD-10-CM | POA: Diagnosis not present

## 2022-11-22 DIAGNOSIS — D2271 Melanocytic nevi of right lower limb, including hip: Secondary | ICD-10-CM | POA: Diagnosis not present

## 2022-11-22 DIAGNOSIS — D2262 Melanocytic nevi of left upper limb, including shoulder: Secondary | ICD-10-CM | POA: Diagnosis not present

## 2022-11-22 DIAGNOSIS — Z85828 Personal history of other malignant neoplasm of skin: Secondary | ICD-10-CM | POA: Diagnosis not present

## 2022-11-22 DIAGNOSIS — D2261 Melanocytic nevi of right upper limb, including shoulder: Secondary | ICD-10-CM | POA: Diagnosis not present

## 2022-11-22 DIAGNOSIS — D225 Melanocytic nevi of trunk: Secondary | ICD-10-CM | POA: Diagnosis not present

## 2022-11-28 DIAGNOSIS — S52502D Unspecified fracture of the lower end of left radius, subsequent encounter for closed fracture with routine healing: Secondary | ICD-10-CM | POA: Diagnosis not present

## 2022-11-28 DIAGNOSIS — S52602D Unspecified fracture of lower end of left ulna, subsequent encounter for closed fracture with routine healing: Secondary | ICD-10-CM | POA: Diagnosis not present

## 2022-12-17 ENCOUNTER — Ambulatory Visit (INDEPENDENT_AMBULATORY_CARE_PROVIDER_SITE_OTHER): Payer: Medicare Other | Admitting: Internal Medicine

## 2022-12-17 ENCOUNTER — Encounter: Payer: Self-pay | Admitting: Internal Medicine

## 2022-12-17 VITALS — BP 126/76 | HR 76 | Ht 61.0 in | Wt 126.4 lb

## 2022-12-17 DIAGNOSIS — E118 Type 2 diabetes mellitus with unspecified complications: Secondary | ICD-10-CM | POA: Diagnosis not present

## 2022-12-17 DIAGNOSIS — B029 Zoster without complications: Secondary | ICD-10-CM | POA: Diagnosis not present

## 2022-12-17 DIAGNOSIS — S62102S Fracture of unspecified carpal bone, left wrist, sequela: Secondary | ICD-10-CM | POA: Diagnosis not present

## 2022-12-17 DIAGNOSIS — E785 Hyperlipidemia, unspecified: Secondary | ICD-10-CM | POA: Diagnosis not present

## 2022-12-17 DIAGNOSIS — I10 Essential (primary) hypertension: Secondary | ICD-10-CM

## 2022-12-17 DIAGNOSIS — E1169 Type 2 diabetes mellitus with other specified complication: Secondary | ICD-10-CM

## 2022-12-17 DIAGNOSIS — M810 Age-related osteoporosis without current pathological fracture: Secondary | ICD-10-CM | POA: Diagnosis not present

## 2022-12-17 DIAGNOSIS — R7989 Other specified abnormal findings of blood chemistry: Secondary | ICD-10-CM

## 2022-12-17 MED ORDER — ALENDRONATE SODIUM 70 MG PO TABS: 70.0000 mg | ORAL_TABLET | ORAL | 3 refills | Status: DC

## 2022-12-17 NOTE — Assessment & Plan Note (Signed)
Recent wrist fracture from a fall Vitamin D normal last year Should consider Fosamax

## 2022-12-17 NOTE — Assessment & Plan Note (Signed)
Clinically stable exam with well controlled BP on lisinopril and spironolactone. Tolerating medications without side effects. Pt to continue current regimen and low sodium diet.

## 2022-12-17 NOTE — Assessment & Plan Note (Signed)
Tolerating statin medications without concerns LDL is  Lab Results  Component Value Date   LDLCALC 91 12/14/2021   with a goal of < 70. Current dose will be adjusted if needed.

## 2022-12-17 NOTE — Assessment & Plan Note (Signed)
No symptoms currently 

## 2022-12-17 NOTE — Progress Notes (Signed)
Date:  12/17/2022   Name:  Lori Harrell   DOB:  02/19/1941   MRN:  [id number]   Chief Complaint: Annual Exam Lori Harrell is a 82 y.o. female who presents today for her Complete Annual Exam. She feels well. She reports exercising - none. She reports she is sleeping fairly well. Breast complaints - two sores on right breast.  Mammogram: 2020 discontinued DEXA: 01/2022 osteopenia Colonoscopy: none?  Health Maintenance Due  Topic Date Due   DTaP/Tdap/Td (2 - Td or Tdap) 10/01/2020   Diabetic kidney evaluation - Urine ACR  12/15/2022    Immunization History  Administered Date(s) Administered   Fluad Quad(high Dose 65+) 08/12/2019, 08/28/2020, 05/31/2021, 08/27/2022   Influenza, High Dose Seasonal PF 07/07/2017, 07/08/2018   Influenza, Seasonal, Injecte, Preservative Fre 08/19/2013   Influenza,inj,Quad PF,6+ Mos 06/23/2015, 07/04/2016   Influenza-Unspecified 06/18/2011, 08/21/2012   PFIZER(Purple Top)SARS-COV-2 Vaccination 12/24/2019, 01/14/2020, 10/13/2020   Pneumococcal Conjugate-13 06/20/2014   Pneumococcal Polysaccharide-23 10/02/2007   Tdap 10/01/2010    Hypertension This is a chronic problem. The problem is controlled. Pertinent negatives include no chest pain, headaches, palpitations or shortness of breath. Past treatments include ACE inhibitors and diuretics. The current treatment provides significant improvement. There are no compliance problems.  There is no history of kidney disease, CAD/MI or CVA.  Diabetes She presents for her follow-up diabetic visit. She has type 2 diabetes mellitus. Her disease course has been stable. Pertinent negatives for hypoglycemia include no dizziness, headaches, nervousness/anxiousness or tremors. Pertinent negatives for diabetes include no chest pain, no fatigue, no polydipsia and no polyuria. Pertinent negatives for diabetic complications include no CVA.  Hyperlipidemia This is a chronic problem. The problem is controlled.  Pertinent negatives include no chest pain or shortness of breath. Current antihyperlipidemic treatment includes statins. The current treatment provides significant improvement of lipids.    Lab Results  Component Value Date   NA 136 02/06/2022   K 4.3 02/06/2022   CO2 25 02/06/2022   GLUCOSE 96 02/06/2022   BUN 12 02/06/2022   CREATININE 0.65 02/06/2022   CALCIUM 9.5 02/06/2022   EGFR 77 12/14/2021   GFRNONAA >60 02/06/2022   Lab Results  Component Value Date   CHOL 159 12/14/2021   HDL 52 12/14/2021   LDLCALC 91 12/14/2021   TRIG 87 12/14/2021   CHOLHDL 3.1 12/14/2021   Lab Results  Component Value Date   TSH 5.650 (H) 12/14/2021   Lab Results  Component Value Date   HGBA1C 5.8 (A) 08/27/2022   Lab Results  Component Value Date   WBC 6.1 02/06/2022   HGB 12.2 02/06/2022   HCT 37.6 02/06/2022   MCV 93.3 02/06/2022   PLT 210 02/06/2022   Lab Results  Component Value Date   ALT 20 02/06/2022   AST 23 02/06/2022   ALKPHOS 54 02/06/2022   BILITOT 0.7 02/06/2022   Lab Results  Component Value Date   VD25OH 61.0 12/14/2021     Review of Systems  Constitutional:  Negative for chills, fatigue and fever.  HENT:  Negative for congestion, hearing loss, tinnitus, trouble swallowing and voice change.   Eyes:  Negative for visual disturbance.  Respiratory:  Negative for cough, chest tightness, shortness of breath and wheezing.   Cardiovascular:  Negative for chest pain, palpitations and leg swelling.  Gastrointestinal:  Negative for abdominal pain, constipation, diarrhea and vomiting.  Endocrine: Negative for polydipsia and polyuria.  Genitourinary:  Negative for dysuria, frequency, genital sores, vaginal bleeding and vaginal  discharge.  Musculoskeletal:  Positive for arthralgias (fractured left wrist in Jan 2024). Negative for gait problem and joint swelling.  Skin:  Negative for color change and rash.  Neurological:  Negative for dizziness, tremors, light-headedness  and headaches.  Hematological:  Negative for adenopathy. Does not bruise/bleed easily.  Psychiatric/Behavioral:  Negative for dysphoric mood and sleep disturbance. The patient is not nervous/anxious.     Patient Active Problem List   Diagnosis Date Noted   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/27/2022   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   Basal cell carcinoma (BCC) of skin of lip 05/31/2021   Elevated TSH 12/15/2020   S/P lumbar fusion 05/19/2019   Red blood cell antibody positive 02/03/2019   DDD (degenerative disc disease), lumbar 01/07/2019   Carotid artery stenosis, asymptomatic, bilateral 07/07/2017   Primary open angle glaucoma 05/08/2017   Old partial retinal detachment 05/08/2017   Large hiatal hernia 07/04/2016   Sigmoid diverticulitis 07/04/2016   Stress incontinence 07/04/2016   Type II diabetes mellitus with complication ([place]) 60/45/4098   Constipation 10/31/2015   Barrett's esophagus without dysplasia 06/16/2015   Essential (primary) hypertension 06/16/2015   Hyperlipidemia associated with type 2 diabetes mellitus ([place]) 06/16/2015   OP (osteoporosis) 06/16/2015   Hypercalcemia 04/22/2012    No Known Allergies  Past Surgical History:  Procedure Laterality Date   ANTERIOR LATERAL LUMBAR FUSION WITH PERCUTANEOUS SCREW 2 LEVEL N/A 05/19/2019   Procedure: L2-4 LATERAL LUMBAR INTERBODY FUSION (XLIF);  Surgeon: Meade Maw, MD;  Location: [facility] ORS;  Service: Neurosurgery;  Laterality: N/A;   CATARACT EXTRACTION Bilateral    CHOLECYSTECTOMY, LAPAROSCOPIC  02/05/2019   [facility]   COLONOSCOPY  2007   sigmoid diverticulosis   ENDOSCOPIC RETROGRADE CHOLANGIOPANCREATOGRAPHY (ERCP) WITH PROPOFOL N/A 02/02/2019   Procedure: ENDOSCOPIC RETROGRADE CHOLANGIOPANCREATOGRAPHY (ERCP) WITH PROPOFOL;  Surgeon: Lucilla Lame, MD;  Location: [facility] ENDOSCOPY;  Service: Endoscopy;  Laterality: N/A;   ESOPHAGOGASTRODUODENOSCOPY  04/22/2016   ESOPHAGOGASTRODUODENOSCOPY   04/26/2019   ESOPHAGOGASTRODUODENOSCOPY  09/15/2019   REVERSE SHOULDER ARTHROPLASTY Left 02/18/2022   Procedure: Left reverse shoulder arthroplasty, biceps tenodesis;  Surgeon: Leim Fabry, MD;  Location: [facility] ORS;  Service: Orthopedics;  Laterality: Left;   TONSILLECTOMY     UPPER GASTROINTESTINAL ENDOSCOPY  02/2013   Barrett's - done at [facility]      Social History   Tobacco Use   Smoking status: Never   Smokeless tobacco: Never   Tobacco comments:    smoking cessation materials not required  Vaping Use   Vaping Use: Never used  Substance Use Topics   Alcohol use: No    Alcohol/week: 0.0 standard drinks of alcohol   Drug use: No     Medication list has been reviewed and updated.  Current Meds  Medication Sig   acetaminophen (TYLENOL) 500 MG tablet Take 1,000 mg by mouth 2 (two) times daily.   alendronate (FOSAMAX) 70 MG tablet Take 1 tablet (70 mg total) by mouth every 7 (seven) days. Take with a full glass of water on an empty stomach.   atorvastatin (LIPITOR) 20 MG tablet TAKE 1 TABLET DAILY   Calcium-Magnesium-Vitamin D (CALCIUM 1200+D3 PO) Take 1 tablet by mouth every evening.   dorzolamide-timolol (COSOPT) 22.3-6.8 MG/ML ophthalmic solution Place 1 drop into both eyes 2 (two) times daily.   esomeprazole (NEXIUM) 40 MG capsule TAKE 1 CAPSULE TWICE A DAY   FREESTYLE LITE test strip USE TO TEST BLOOD SUGAR DAILY   Lancets (FREESTYLE) lancets USE 1  LANCET DAILY   latanoprost (XALATAN) 0.005 % ophthalmic solution Place 1 drop into both eyes at bedtime.   lisinopril (ZESTRIL) 20 MG tablet Take 1 tablet (20 mg total) by mouth daily.   Menthol, Topical Analgesic, (BIOFREEZE) 10 % LIQD Apply 1 application. topically 3 (three) times daily as needed (pain.).   metFORMIN (GLUCOPHAGE) 500 MG tablet TAKE 1 TABLET DAILY   Multiple Vitamin (MULTIVITAMIN WITH MINERALS) TABS tablet Take 1 tablet by mouth in the morning. Women's Centrum Silver   Multiple  Vitamins-Minerals (PRESERVISION AREDS 2 PO) Take by mouth 2 (two) times daily.   nystatin (MYCOSTATIN) 100000 UNIT/ML suspension 1 tsp - swish, gargle and spit 4 times a day   polycarbophil (FIBERCON) 625 MG tablet Take 625 mg by mouth daily.   spironolactone (ALDACTONE) 25 MG tablet TAKE 1 TABLET DAILY   [DISCONTINUED] dextromethorphan-guaiFENesin (MUCINEX DM) 30-600 MG 12hr tablet Take 1 tablet by mouth 2 (two) times daily.       12/17/2022    9:58 AM 10/04/2022   10:22 AM 08/27/2022    9:29 AM 04/22/2022    9:44 AM  GAD 7 : Generalized Anxiety Score  Nervous, Anxious, on Edge 0 0 0 0  Control/stop worrying 0 0 0 0  Worry too much - different things 0 0 0 0  Trouble relaxing 0 0 0 0  Restless 0 0 0 0  Easily annoyed or irritable 0 0 0 0  Afraid - awful might happen 0 0 0 0  Total GAD 7 Score 0 0 0 0  Anxiety Difficulty Not difficult at all Not difficult at all Not difficult at all Not difficult at all       12/17/2022    9:57 AM 10/04/2022   10:22 AM 08/27/2022    9:29 AM  Depression screen PHQ 2/9  Decreased Interest 0 1 1  Down, Depressed, Hopeless 0 1 1  PHQ - 2 Score 0 2 2  Altered sleeping 0 1 1  Tired, decreased energy 0 1 1  Change in appetite 0 1 0  Feeling bad or failure about yourself  0 0 0  Trouble concentrating 0 0 0  Moving slowly or fidgety/restless 0 0 0  Suicidal thoughts 0 0 0  PHQ-9 Score 0 5 4  Difficult doing work/chores Not difficult at all Not difficult at all Not difficult at all    BP Readings from Last 3 Encounters:  12/17/22 126/76  10/29/22 (!) 161/85  10/04/22 138/84    Physical Exam Vitals and nursing note reviewed.  Constitutional:      General: She is not in acute distress.    Appearance: She is well-developed.  HENT:     Head: Normocephalic and atraumatic.     Right Ear: Tympanic membrane and ear canal normal.     Left Ear: Tympanic membrane and ear canal normal.     Nose:     Right Sinus: No maxillary sinus tenderness.      Left Sinus: No maxillary sinus tenderness.  Eyes:     General: No scleral icterus.       Right eye: No discharge.        Left eye: No discharge.     Conjunctiva/sclera: Conjunctivae normal.  Neck:     Thyroid: No thyromegaly.     Vascular: No carotid bruit.  Cardiovascular:     Rate and Rhythm: Normal rate and regular rhythm.     Pulses: Normal pulses.     Heart sounds: Normal  heart sounds.  Pulmonary:     Effort: Pulmonary effort is normal. No respiratory distress.     Breath sounds: No wheezing.  Chest:  Breasts:    Right: No mass, nipple discharge, skin change or tenderness.     Left: No mass, nipple discharge, skin change or tenderness.       Comments: Healing rash c/w zoster Abdominal:     General: Bowel sounds are normal.     Palpations: Abdomen is soft.     Tenderness: There is no abdominal tenderness.  Musculoskeletal:     Left wrist: Bony tenderness (splint on left wrist) present.     Cervical back: Normal range of motion. No erythema.     Right lower leg: No edema.     Left lower leg: No edema.  Lymphadenopathy:     Cervical: No cervical adenopathy.  Skin:    General: Skin is warm and dry.     Findings: No rash.  Neurological:     Mental Status: She is alert and oriented to person, place, and time.     Cranial Nerves: No cranial nerve deficit.     Sensory: No sensory deficit.     Deep Tendon Reflexes: Reflexes are normal and symmetric.  Psychiatric:        Attention and Perception: Attention normal.        Mood and Affect: Mood normal.     Wt Readings from Last 3 Encounters:  12/17/22 126 lb 6.4 oz (57.3 kg)  10/29/22 125 lb (56.7 kg)  10/04/22 125 lb (56.7 kg)    BP 126/76   Pulse 76   Ht 5\' 1"  (1.549 m)   Wt 126 lb 6.4 oz (57.3 kg)   SpO2 94%   BMI 23.88 kg/m   Assessment and Plan:  Problem List Items Addressed This Visit       Cardiovascular and Mediastinum   Essential (primary) hypertension - Primary (Chronic)    Clinically stable  exam with well controlled BP on lisinopril and spironolactone. Tolerating medications without side effects. Pt to continue current regimen and low sodium diet.       Relevant Orders   CBC with Differential/Platelet     Endocrine   Hyperlipidemia associated with type 2 diabetes mellitus ([place]) (Chronic)    Tolerating statin medications without concerns LDL is  Lab Results  Component Value Date   LDLCALC 91 12/14/2021  with a goal of < 70. Current dose will be adjusted if needed.       Relevant Orders   Lipid panel   Type II diabetes mellitus with complication (HCC) (Chronic)    Clinically stable without s/s of hypoglycemia. Tolerating metformin well without side effects or other concerns. Lab Results  Component Value Date   HGBA1C 5.8 (A) 08/27/2022        Relevant Orders   Comprehensive metabolic panel   Hemoglobin A1c   Microalbumin / creatinine urine ratio     Musculoskeletal and Integument   OP (osteoporosis)    Recent wrist fracture from a fall Vitamin D normal last year Should consider Fosamax      Relevant Medications   alendronate (FOSAMAX) 70 MG tablet     Other   Elevated TSH    No symptoms currently      Relevant Orders   TSH + free T4   Hypercalcemia    Improved after stopping HCTZ Will continue to monitor      Relevant Orders   Comprehensive metabolic panel  Other Visit Diagnoses     Herpes zoster without complication       on the right breast continue local care only   Closed fracture of left wrist, sequela       follow up with Ortho for brace management       Return in about 4 months (around 04/18/2023) for DM, HTN.   Partially dictated using [place], any errors are not intentional.  Glean Hess, MD [place], [place]

## 2022-12-17 NOTE — Assessment & Plan Note (Signed)
Improved after stopping HCTZ Will continue to monitor

## 2022-12-17 NOTE — Assessment & Plan Note (Signed)
Clinically stable without s/s of hypoglycemia. Tolerating metformin well without side effects or other concerns. Lab Results  Component Value Date   HGBA1C 5.8 (A) 08/27/2022

## 2022-12-18 ENCOUNTER — Ambulatory Visit: Payer: Self-pay | Admitting: *Deleted

## 2022-12-18 LAB — CBC WITH DIFFERENTIAL/PLATELET
Basophils Absolute: 0.1 10*3/uL (ref 0.0–0.2)
Basos: 1 %
EOS (ABSOLUTE): 0.1 10*3/uL (ref 0.0–0.4)
Eos: 1 %
Hematocrit: 34.8 % (ref 34.0–46.6)
Hemoglobin: 12 g/dL (ref 11.1–15.9)
Immature Grans (Abs): 0 10*3/uL (ref 0.0–0.1)
Immature Granulocytes: 0 %
Lymphocytes Absolute: 1.4 10*3/uL (ref 0.7–3.1)
Lymphs: 20 %
MCH: 31.7 pg (ref 26.6–33.0)
MCHC: 34.5 g/dL (ref 31.5–35.7)
MCV: 92 fL (ref 79–97)
Monocytes Absolute: 0.4 10*3/uL (ref 0.1–0.9)
Monocytes: 6 %
Neutrophils Absolute: 5.1 10*3/uL (ref 1.4–7.0)
Neutrophils: 72 %
Platelets: 222 10*3/uL (ref 150–450)
RBC: 3.79 x10E6/uL (ref 3.77–5.28)
RDW: 12.2 % (ref 11.7–15.4)
WBC: 7.1 10*3/uL (ref 3.4–10.8)

## 2022-12-18 LAB — COMPREHENSIVE METABOLIC PANEL
ALT: 19 IU/L (ref 0–32)
AST: 23 IU/L (ref 0–40)
Albumin/Globulin Ratio: 1.9 (ref 1.2–2.2)
Albumin: 4.4 g/dL (ref 3.7–4.7)
Alkaline Phosphatase: 83 IU/L (ref 44–121)
BUN/Creatinine Ratio: 21 (ref 12–28)
BUN: 15 mg/dL (ref 8–27)
Bilirubin Total: 0.3 mg/dL (ref 0.0–1.2)
CO2: 20 mmol/L (ref 20–29)
Calcium: 9.8 mg/dL (ref 8.7–10.3)
Chloride: 102 mmol/L (ref 96–106)
Creatinine, Ser: 0.71 mg/dL (ref 0.57–1.00)
Globulin, Total: 2.3 g/dL (ref 1.5–4.5)
Glucose: 102 mg/dL — ABNORMAL HIGH (ref 70–99)
Potassium: 4.7 mmol/L (ref 3.5–5.2)
Sodium: 140 mmol/L (ref 134–144)
Total Protein: 6.7 g/dL (ref 6.0–8.5)
eGFR: 85 mL/min/{1.73_m2} (ref >59)

## 2022-12-18 LAB — LIPID PANEL
Chol/HDL Ratio: 3.2 ratio (ref 0.0–4.4)
Cholesterol, Total: 155 mg/dL (ref 100–199)
HDL: 49 mg/dL (ref >39)
LDL Chol Calc (NIH): 89 mg/dL (ref 0–99)
Triglycerides: 88 mg/dL (ref 0–149)
VLDL Cholesterol Cal: 17 mg/dL (ref 5–40)

## 2022-12-18 LAB — TSH+FREE T4
Free T4: 1.06 ng/dL (ref 0.82–1.77)
TSH: 5.62 u[IU]/mL — ABNORMAL HIGH (ref 0.450–4.500)

## 2022-12-18 LAB — MICROALBUMIN / CREATININE URINE RATIO
Creatinine, Urine: 66.9 mg/dL
Microalb/Creat Ratio: 11 mg/g creat (ref 0–29)
Microalbumin, Urine: 7.5 ug/mL

## 2022-12-18 LAB — HEMOGLOBIN A1C
Est. average glucose Bld gHb Est-mCnc: 131 mg/dL
Hgb A1c MFr Bld: 6.2 % — ABNORMAL HIGH (ref 4.8–5.6)

## 2022-12-18 NOTE — Telephone Encounter (Signed)
Noted  KP 

## 2022-12-18 NOTE — Telephone Encounter (Signed)
  Chief Complaint: lab results- A1c  Disposition: [] ED /[] Urgent Care (no appt availability in office) / [] Appointment(In office/virtual)/ []  [place] [facility]/ [] Home Care/ [] Refused Recommended Disposition /[] [place] [facility]/ [x]  Follow-up with PCP Additional Notes: Patient calling for lab results- advised still pending

## 2022-12-18 NOTE — Telephone Encounter (Signed)
Pt would like to know what her A1C number was from her visit yesterday / please advise   Reason for Disposition . [1] Caller requesting NON-URGENT health information AND [2] PCP's office is the best resource  Answer Assessment - Initial Assessment Questions 1. REASON FOR CALL or QUESTION: "What is your reason for calling today?" or "How can I best help you?" or "What question do you have that I can help answer?"     Patient calling to check lab results- advised still pending  Protocols used: Information Only Call - No Triage-A-AH

## 2022-12-19 ENCOUNTER — Ambulatory Visit (INDEPENDENT_AMBULATORY_CARE_PROVIDER_SITE_OTHER): Payer: Medicare Other | Admitting: Podiatry

## 2022-12-19 ENCOUNTER — Encounter: Payer: Self-pay | Admitting: Podiatry

## 2022-12-19 VITALS — BP 153/75 | HR 61

## 2022-12-19 DIAGNOSIS — M79674 Pain in right toe(s): Secondary | ICD-10-CM

## 2022-12-19 DIAGNOSIS — B351 Tinea unguium: Secondary | ICD-10-CM

## 2022-12-19 DIAGNOSIS — E118 Type 2 diabetes mellitus with unspecified complications: Secondary | ICD-10-CM | POA: Diagnosis not present

## 2022-12-19 DIAGNOSIS — S52602D Unspecified fracture of lower end of left ulna, subsequent encounter for closed fracture with routine healing: Secondary | ICD-10-CM | POA: Diagnosis not present

## 2022-12-19 DIAGNOSIS — M79675 Pain in left toe(s): Secondary | ICD-10-CM | POA: Diagnosis not present

## 2022-12-19 DIAGNOSIS — S52502D Unspecified fracture of the lower end of left radius, subsequent encounter for closed fracture with routine healing: Secondary | ICD-10-CM | POA: Diagnosis not present

## 2022-12-19 NOTE — Progress Notes (Signed)
This patient returns to my office for at risk foot care.  This patient requires this care by a professional since this patient will be at risk due to having diabetes with no complications.  This patient is unable to cut nails herself since the patient cannot reach her nails.These nails are painful walking and wearing shoes.  This patient presents for at risk foot care today.  General Appearance  Alert, conversant and in no acute stress.  Vascular  Dorsalis pedis and posterior tibial  pulses are palpable  bilaterally.  Capillary return is within normal limits  bilaterally. Temperature is within normal limits  bilaterally.  Neurologic  Senn-Weinstein monofilament wire test within normal limits  bilaterally. Muscle power within normal limits bilaterally.  Nails Thick disfigured discolored nails with subungual debris  hallux nails  bilaterally. No evidence of bacterial infection or drainage bilaterally.  Orthopedic  No limitations of motion  feet .  No crepitus or effusions noted.  No bony pathology or digital deformities noted. Contracted fifth toe left foot.  Skin  normotropic skin with no porokeratosis noted bilaterally.  No signs of infections or ulcers noted.     Onychomycosis  Pain in right toes  Pain in left toes  Consent was obtained for treatment procedures.   Mechanical debridement of nails 1-5  bilaterally performed with a nail nipper.  Filed with dremel without incident.    Return office visit    4 months                 Told patient to return for periodic foot care and evaluation due to potential at risk complications.   Yahayra Geis DPM   

## 2022-12-24 ENCOUNTER — Other Ambulatory Visit: Payer: Self-pay | Admitting: Internal Medicine

## 2022-12-24 DIAGNOSIS — E118 Type 2 diabetes mellitus with unspecified complications: Secondary | ICD-10-CM

## 2022-12-24 DIAGNOSIS — E1169 Type 2 diabetes mellitus with other specified complication: Secondary | ICD-10-CM

## 2022-12-24 NOTE — Telephone Encounter (Signed)
Requested Prescriptions  Pending Prescriptions Disp Refills   atorvastatin (LIPITOR) 20 MG tablet [Pharmacy Med Name: ATORVASTATIN TABS 20MG ] 90 tablet 2    Sig: TAKE 1 TABLET DAILY     Cardiovascular:  Antilipid - Statins Failed - 12/24/2022  3:11 AM      Failed - Lipid Panel in normal range within the last 12 months    Cholesterol, Total  Date Value Ref Range Status  12/17/2022 155 100 - 199 mg/dL Final   LDL Chol Calc (NIH)  Date Value Ref Range Status  12/17/2022 89 0 - 99 mg/dL Final   HDL  Date Value Ref Range Status  12/17/2022 49 >39 mg/dL Final   Triglycerides  Date Value Ref Range Status  12/17/2022 88 0 - 149 mg/dL Final         Passed - Patient is not pregnant      Passed - Valid encounter within last 12 months    Recent Outpatient Visits           1 week ago Essential (primary) hypertension   [place] [facility] at [facility], Jesse Sans, MD   2 months ago Upper respiratory tract infection, unspecified type   [place] [facility] at [facility], Jesse Sans, MD   3 months ago Type II diabetes mellitus with complication [facility])   [place] [facility] at [facility], Jesse Sans, MD   8 months ago Essential (primary) hypertension   [place] [facility] at [facility], Jesse Sans, MD   1 year ago Essential (primary) hypertension   [place] [facility] at [facility], Jesse Sans, MD       Future Appointments             In 3 months Army Melia Jesse Sans, MD [facility] at [place]             metFORMIN (GLUCOPHAGE) 500 MG tablet [Pharmacy Med Name: METFORMIN HCL TABS 500MG ] 90 tablet 2    Sig: TAKE 1 TABLET DAILY     Endocrinology:  Diabetes - Biguanides Failed - 12/24/2022  3:11 AM      Failed - B12 Level in normal range and within 720 days    No  results found for: "VITAMINB12"       Passed - Cr in normal range and within 360 days    Creatinine, Ser  Date Value Ref Range Status  12/17/2022 0.71 0.57 - 1.00 mg/dL Final         Passed - HBA1C is between 0 and 7.9 and within 180 days    Hgb A1c MFr Bld  Date Value Ref Range Status  12/17/2022 6.2 (H) 4.8 - 5.6 % Final    Comment:             Prediabetes: 5.7 - 6.4          Diabetes: >6.4          Glycemic control for adults with diabetes: <7.0          Passed - eGFR in normal range and within 360 days    GFR calc Af Amer  Date Value Ref Range Status  08/28/2020 72 >59 mL/min/1.73 Final    Comment:    **In accordance with recommendations from the NKF-ASN Task force,**   [facility] is in the process  of updating its eGFR calculation to the   2021 CKD-EPI creatinine equation that estimates kidney function   without a race variable.    GFR, Estimated  Date Value Ref Range Status  02/06/2022 >60 >60 mL/min Final    Comment:    (NOTE) Calculated using the CKD-EPI Creatinine Equation (2021)    eGFR  Date Value Ref Range Status  12/17/2022 85 >59 mL/min/1.73 Final         Passed - Valid encounter within last 6 months    Recent Outpatient Visits           1 week ago Essential (primary) hypertension   [place] [facility] at [facility], Jesse Sans, MD   2 months ago Upper respiratory tract infection, unspecified type   [facility] at [facility], Jesse Sans, MD   3 months ago Type II diabetes mellitus with complication [facility])   [place] [facility] at [facility], Jesse Sans, MD   8 months ago Essential (primary) hypertension   [place] [facility] at [facility], Jesse Sans, MD   1 year ago Essential (primary) hypertension   [place] [facility] [place] at [facility], Jesse Sans, MD       Future  Appointments             In 3 months Army Melia Jesse Sans, MD [place] at [facility] [place] within normal limits and completed in the last 12 months    WBC  Date Value Ref Range Status  12/17/2022 7.1 3.4 - 10.8 x10E3/uL Final  02/06/2022 6.1 4.0 - 10.5 K/uL Final   RBC  Date Value Ref Range Status  12/17/2022 3.79 3.77 - 5.28 x10E6/uL Final  02/06/2022 4.03 3.87 - 5.11 MIL/uL Final   Hemoglobin  Date Value Ref Range Status  12/17/2022 12.0 11.1 - 15.9 g/dL Final   Hematocrit  Date Value Ref Range Status  12/17/2022 34.8 34.0 - 46.6 % Final   MCHC  Date Value Ref Range Status  12/17/2022 34.5 31.5 - 35.7 g/dL Final  02/06/2022 32.4 30.0 - 36.0 g/dL Final   [facility]  Date Value Ref Range Status  12/17/2022 31.7 26.6 - 33.0 pg Final  02/06/2022 30.3 26.0 - 34.0 pg Final   MCV  Date Value Ref Range Status  12/17/2022 92 79 - 97 fL Final   No results found for: "PLTCOUNTKUC", "LABPLAT", "POCPLA" RDW  Date Value Ref Range Status  12/17/2022 12.2 11.7 - 15.4 % Final

## 2022-12-31 DIAGNOSIS — H338 Other retinal detachments: Secondary | ICD-10-CM | POA: Diagnosis not present

## 2022-12-31 DIAGNOSIS — H04123 Dry eye syndrome of bilateral lacrimal glands: Secondary | ICD-10-CM | POA: Diagnosis not present

## 2022-12-31 DIAGNOSIS — H401121 Primary open-angle glaucoma, left eye, mild stage: Secondary | ICD-10-CM | POA: Diagnosis not present

## 2022-12-31 DIAGNOSIS — Z961 Presence of intraocular lens: Secondary | ICD-10-CM | POA: Diagnosis not present

## 2022-12-31 DIAGNOSIS — E119 Type 2 diabetes mellitus without complications: Secondary | ICD-10-CM | POA: Diagnosis not present

## 2022-12-31 DIAGNOSIS — H353122 Nonexudative age-related macular degeneration, left eye, intermediate dry stage: Secondary | ICD-10-CM | POA: Diagnosis not present

## 2022-12-31 DIAGNOSIS — H401113 Primary open-angle glaucoma, right eye, severe stage: Secondary | ICD-10-CM | POA: Diagnosis not present

## 2023-03-10 DIAGNOSIS — Z96612 Presence of left artificial shoulder joint: Secondary | ICD-10-CM | POA: Diagnosis not present

## 2023-04-18 ENCOUNTER — Ambulatory Visit: Payer: Medicare Other | Admitting: Internal Medicine

## 2023-04-21 ENCOUNTER — Encounter: Payer: Self-pay | Admitting: Internal Medicine

## 2023-04-21 ENCOUNTER — Ambulatory Visit (INDEPENDENT_AMBULATORY_CARE_PROVIDER_SITE_OTHER): Payer: Medicare Other | Admitting: Internal Medicine

## 2023-04-21 ENCOUNTER — Ambulatory Visit (INDEPENDENT_AMBULATORY_CARE_PROVIDER_SITE_OTHER): Payer: Medicare Other | Admitting: Podiatry

## 2023-04-21 ENCOUNTER — Encounter: Payer: Self-pay | Admitting: Podiatry

## 2023-04-21 VITALS — BP 120/68 | HR 77 | Ht 61.0 in | Wt 128.0 lb

## 2023-04-21 DIAGNOSIS — I10 Essential (primary) hypertension: Secondary | ICD-10-CM

## 2023-04-21 DIAGNOSIS — B351 Tinea unguium: Secondary | ICD-10-CM

## 2023-04-21 DIAGNOSIS — B370 Candidal stomatitis: Secondary | ICD-10-CM | POA: Diagnosis not present

## 2023-04-21 DIAGNOSIS — M79674 Pain in right toe(s): Secondary | ICD-10-CM

## 2023-04-21 DIAGNOSIS — E118 Type 2 diabetes mellitus with unspecified complications: Secondary | ICD-10-CM | POA: Diagnosis not present

## 2023-04-21 DIAGNOSIS — M79675 Pain in left toe(s): Secondary | ICD-10-CM | POA: Diagnosis not present

## 2023-04-21 LAB — POCT GLYCOSYLATED HEMOGLOBIN (HGB A1C): Hemoglobin A1C: 5.8 % — AB (ref 4.0–5.6)

## 2023-04-21 MED ORDER — NYSTATIN 100000 UNIT/ML MT SUSP: OROMUCOSAL | 0 refills | Status: AC

## 2023-04-21 NOTE — Progress Notes (Signed)
Date:  04/21/2023   Name:  Lori Harrell   DOB:  [birth year]   MRN:  [id number]   Chief Complaint: Diabetes and Hypertension  Hypertension This is a chronic problem. The problem is controlled. Pertinent negatives include no chest pain, headaches, palpitations or shortness of breath.  Diabetes She presents for her follow-up diabetic visit. She has type 2 diabetes mellitus. Her disease course has been stable. Pertinent negatives for hypoglycemia include no headaches or tremors. Pertinent negatives for diabetes include no chest pain, no fatigue, no polydipsia and no polyuria.    Lab Results  Component Value Date   NA 140 12/17/2022   K 4.7 12/17/2022   CO2 20 12/17/2022   GLUCOSE 102 (H) 12/17/2022   BUN 15 12/17/2022   CREATININE 0.71 12/17/2022   CALCIUM 9.8 12/17/2022   EGFR 85 12/17/2022   GFRNONAA >60 02/06/2022   Lab Results  Component Value Date   CHOL 155 12/17/2022   HDL 49 12/17/2022   LDLCALC 89 12/17/2022   TRIG 88 12/17/2022   CHOLHDL 3.2 12/17/2022   Lab Results  Component Value Date   TSH 5.620 (H) 12/17/2022   Lab Results  Component Value Date   HGBA1C 5.8 (A) 04/21/2023   Lab Results  Component Value Date   WBC 7.1 12/17/2022   HGB 12.0 12/17/2022   HCT 34.8 12/17/2022   MCV 92 12/17/2022   PLT 222 12/17/2022   Lab Results  Component Value Date   ALT 19 12/17/2022   AST 23 12/17/2022   ALKPHOS 83 12/17/2022   BILITOT 0.3 12/17/2022   Lab Results  Component Value Date   VD25OH 61.0 12/14/2021     Review of Systems  Constitutional:  Negative for appetite change, fatigue, fever and unexpected weight change.  HENT:  Positive for mouth sores (tongue tenderness). Negative for tinnitus and trouble swallowing.   Eyes:  Negative for visual disturbance.  Respiratory:  Negative for cough, chest tightness and shortness of breath.   Cardiovascular:  Negative for chest pain, palpitations and leg swelling.  Gastrointestinal:  Negative for  abdominal pain.  Endocrine: Negative for polydipsia and polyuria.  Genitourinary:  Negative for dysuria and hematuria.  Musculoskeletal:  Negative for arthralgias.  Neurological:  Negative for tremors, numbness and headaches.  Psychiatric/Behavioral:  Negative for dysphoric mood.     Patient Active Problem List   Diagnosis Date Noted   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/27/2022   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   Basal cell carcinoma (BCC) of skin of lip 05/31/2021   Elevated TSH 12/15/2020   S/P lumbar fusion 05/19/2019   Red blood cell antibody positive 02/03/2019   DDD (degenerative disc disease), lumbar 01/07/2019   Carotid artery stenosis, asymptomatic, bilateral 07/07/2017   Primary open angle glaucoma 05/08/2017   Old partial retinal detachment 05/08/2017   Large hiatal hernia 07/04/2016   Sigmoid diverticulitis 07/04/2016   Stress incontinence 07/04/2016   Type II diabetes mellitus with complication (HCC) 10/31/2015   Constipation 10/31/2015   Barrett's esophagus without dysplasia 06/16/2015   Essential (primary) hypertension 06/16/2015   Hyperlipidemia associated with type 2 diabetes mellitus (HCC) 06/16/2015   OP (osteoporosis) 06/16/2015   Hypercalcemia 04/22/2012    No Known Allergies  Past Surgical History:  Procedure Laterality Date   ANTERIOR LATERAL LUMBAR FUSION WITH PERCUTANEOUS SCREW 2 LEVEL N/A 05/19/2019   Procedure: L2-4 LATERAL LUMBAR INTERBODY FUSION (XLIF);  Surgeon: Venetia Night, MD;  Location: [facility] ORS;  Service: Neurosurgery;  Laterality: N/A;   CATARACT EXTRACTION Bilateral    CHOLECYSTECTOMY, LAPAROSCOPIC  02/05/2019   [facility]   COLONOSCOPY  2007   sigmoid diverticulosis   ENDOSCOPIC RETROGRADE CHOLANGIOPANCREATOGRAPHY (ERCP) WITH PROPOFOL N/A 02/02/2019   Procedure: ENDOSCOPIC RETROGRADE CHOLANGIOPANCREATOGRAPHY (ERCP) WITH PROPOFOL;  Surgeon: Midge Minium, MD;  Location: [facility] ENDOSCOPY;  Service: Endoscopy;   Laterality: N/A;   ESOPHAGOGASTRODUODENOSCOPY  04/22/2016   ESOPHAGOGASTRODUODENOSCOPY  04/26/2019   ESOPHAGOGASTRODUODENOSCOPY  09/15/2019   REVERSE SHOULDER ARTHROPLASTY Left 02/18/2022   Procedure: Left reverse shoulder arthroplasty, biceps tenodesis;  Surgeon: Signa Kell, MD;  Location: [facility] ORS;  Service: Orthopedics;  Laterality: Left;   TONSILLECTOMY     UPPER GASTROINTESTINAL ENDOSCOPY  02/2013   Barrett's - done at [facility]   VAGINAL HYSTERECTOMY      Social History   Tobacco Use   Smoking status: Never   Smokeless tobacco: Never   Tobacco comments:    smoking cessation materials not required  Vaping Use   Vaping status: Never Used  Substance Use Topics   Alcohol use: No    Alcohol/week: 0.0 standard drinks of alcohol   Drug use: No     Medication list has been reviewed and updated.  Current Meds  Medication Sig   acetaminophen (TYLENOL) 500 MG tablet Take 1,000 mg by mouth 2 (two) times daily.   alendronate (FOSAMAX) 70 MG tablet Take 1 tablet (70 mg total) by mouth every 7 (seven) days. Take with a full glass of water on an empty stomach.   atorvastatin (LIPITOR) 20 MG tablet TAKE 1 TABLET DAILY   dorzolamide-timolol (COSOPT) 22.3-6.8 MG/ML ophthalmic solution Place 1 drop into both eyes 2 (two) times daily.   esomeprazole (NEXIUM) 40 MG capsule TAKE 1 CAPSULE TWICE A DAY   FREESTYLE LITE test strip USE TO TEST BLOOD SUGAR DAILY   Lancets (FREESTYLE) lancets USE 1 LANCET DAILY   latanoprost (XALATAN) 0.005 % ophthalmic solution Place 1 drop into both eyes at bedtime.   lisinopril (ZESTRIL) 20 MG tablet Take 1 tablet (20 mg total) by mouth daily.   Menthol, Topical Analgesic, (BIOFREEZE) 10 % LIQD Apply 1 application. topically 3 (three) times daily as needed (pain.).   metFORMIN (GLUCOPHAGE) 500 MG tablet TAKE 1 TABLET DAILY   Multiple Vitamin (MULTIVITAMIN WITH MINERALS) TABS tablet Take 1 tablet by mouth in the morning. Women's Centrum Silver   Multiple  Vitamins-Minerals (PRESERVISION AREDS 2 PO) Take by mouth 2 (two) times daily.   polycarbophil (FIBERCON) 625 MG tablet Take 625 mg by mouth daily.   spironolactone (ALDACTONE) 25 MG tablet TAKE 1 TABLET DAILY       04/21/2023    3:18 PM 12/17/2022    9:58 AM 10/04/2022   10:22 AM 08/27/2022    9:29 AM  GAD 7 : Generalized Anxiety Score  Nervous, Anxious, on Edge 0 0 0 0  Control/stop worrying 0 0 0 0  Worry too much - different things 0 0 0 0  Trouble relaxing 0 0 0 0  Restless 0 0 0 0  Easily annoyed or irritable 0 0 0 0  Afraid - awful might happen 0 0 0 0  Total GAD 7 Score 0 0 0 0  Anxiety Difficulty Not difficult at all Not difficult at all Not difficult at all Not difficult at all       04/21/2023    3:18 PM 12/17/2022    9:57 AM 10/04/2022   10:22 AM  Depression screen PHQ 2/9  Decreased Interest 0  0 1  Down, Depressed, Hopeless 0 0 1  PHQ - 2 Score 0 0 2  Altered sleeping 1 0 1  Tired, decreased energy 1 0 1  Change in appetite 0 0 1  Feeling bad or failure about yourself  0 0 0  Trouble concentrating 0 0 0  Moving slowly or fidgety/restless 0 0 0  Suicidal thoughts 0 0 0  PHQ-9 Score 2 0 5  Difficult doing work/chores Not difficult at all Not difficult at all Not difficult at all    BP Readings from Last 3 Encounters:  04/21/23 120/68  12/19/22 (!) 153/75  12/17/22 126/76    Physical Exam Vitals and nursing note reviewed.  Constitutional:      General: She is not in acute distress.    Appearance: She is well-developed.  HENT:     Head: Normocephalic and atraumatic.     Mouth/Throat:     Tongue: Lesions (coated) present.     Pharynx: Oropharynx is clear.  Cardiovascular:     Rate and Rhythm: Normal rate and regular rhythm.  Pulmonary:     Effort: Pulmonary effort is normal. No respiratory distress.     Breath sounds: No wheezing or rhonchi.  Musculoskeletal:     Cervical back: Normal range of motion.     Right lower leg: No edema.     Left lower  leg: No edema.  Lymphadenopathy:     Cervical: No cervical adenopathy.  Skin:    General: Skin is warm and dry.     Capillary Refill: Capillary refill takes less than 2 seconds.     Findings: No rash.  Neurological:     General: No focal deficit present.     Mental Status: She is alert and oriented to person, place, and time.  Psychiatric:        Mood and Affect: Mood normal.        Behavior: Behavior normal.     Wt Readings from Last 3 Encounters:  04/21/23 128 lb (58.1 kg)  12/17/22 126 lb 6.4 oz (57.3 kg)  10/29/22 125 lb (56.7 kg)    BP 120/68   Pulse 77   Ht 5\' 1"  (1.549 m)   Wt 128 lb (58.1 kg)   SpO2 96%   BMI 24.19 kg/m   Assessment and Plan:  Problem List Items Addressed This Visit     Type II diabetes mellitus with complication (HCC) (Chronic)    Blood sugars stable without hypoglycemic symptoms or events. Current regimen is metformin. Changes made last visit are none. Lab Results  Component Value Date   HGBA1C 6.2 (H) 12/17/2022  A1C today 5.8 Continue same dose unless hypoglycemic episodes occur then consider reducing metformin to 250 mg/day       Relevant Orders   POCT glycosylated hemoglobin (Hb A1C) (Completed)   Essential (primary) hypertension - Primary (Chronic)    Normal exam with stable BP on spironolactone and lisinopril. No concerns or side effects to current medication. No change in regimen; continue low sodium diet.       Other Visit Diagnoses     Thrush       Relevant Medications   nystatin (MYCOSTATIN) 100000 UNIT/ML suspension       Return in about 4 months (around 08/22/2023) for DM, HTN.    Reubin Milan, MD [facility] [place]

## 2023-04-21 NOTE — Assessment & Plan Note (Addendum)
Blood sugars stable without hypoglycemic symptoms or events. Current regimen is metformin. Changes made last visit are none. Lab Results  Component Value Date   HGBA1C 6.2 (H) 12/17/2022  A1C today 5.8 Continue same dose unless hypoglycemic episodes occur then consider reducing metformin to 250 mg/day

## 2023-04-21 NOTE — Progress Notes (Signed)
  Subjective:  Patient ID: Lori Harrell, female    DOB: [birth year],  MRN: [id number]  Lori Harrell presents to clinic today for: preventative diabetic foot care and painful elongated mycotic toenails 1-5 bilaterally which are tender when wearing enclosed shoe gear. Pain is relieved with periodic professional debridement.  Chief Complaint  Patient presents with   Nail Problem    DFC,Referring Provider Reubin Milan, MD,lov:01/24      Callouses    Left ball of foot   PCP is Reubin Milan, MD.  No Known Allergies  Review of Systems: Negative except as noted in the HPI.  Objective: No changes noted in today's physical examination. There were no vitals filed for this visit.  Lori Harrell is a pleasant 82 y.o. female in NAD. AAO x 3.  Vascular Examination: Capillary refill time <3 seconds b/l LE. Palpable pedal pulses b/l LE. Digital hair decreased b/l. No pedal edema b/l. Skin temperature gradient WNL b/l. No varicosities b/l. No cyanosis or clubbing noted b/l LE.Marland Kitchen  Dermatological Examination: Pedal skin with normal turgor, texture and tone b/l. No open wounds. No interdigital macerations b/l. Toenails 1-5 b/l thickened, discolored, dystrophic with subungual debris. There is pain on palpation to dorsal aspect of nailplates. No corns, calluses nor porokeratotic lesions noted, but she does have plantarflexed metatarsals midfoot left foot.  Neurological Examination: Protective sensation intact with 10 gram monofilament b/l LE.   Musculoskeletal Examination: Normal muscle strength 5/5 to all lower extremity muscle groups bilaterally. No pain or crepitus noted with ROM b/l LE. 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 Plantarflexed metatarsals midfoot left foot. Patient ambulates with cane assistance.     Latest Ref Rng & Units 12/17/2022   10:29 AM 08/27/2022    9:40 AM 04/22/2022    9:57 AM  Hemoglobin A1C  Hemoglobin-A1c 4.8 - 5.6 % 6.2   5.8  5.9    Assessment/Plan: 1. Pain due to onychomycosis of toenails of both feet   2. Type II diabetes mellitus with complication [facility])     -Patient was evaluated and treated. All patient's and/or POA's questions/concerns answered on today's visit. -Continue foot and shoe inspections daily. Monitor blood glucose per PCP/Endocrinologist's recommendations. -Patient to continue soft, supportive shoe gear daily. -Toenails 1-5 b/l were debrided in length and girth with sterile nail nippers and dremel without iatrogenic bleeding.  -Patient/POA to call should there be question/concern in the interim.   Return in about 4 months (around 08/22/2023).  Freddie Breech, DPM

## 2023-04-21 NOTE — Assessment & Plan Note (Signed)
Normal exam with stable BP on spironolactone and lisinopril. No concerns or side effects to current medication. No change in regimen; continue low sodium diet.

## 2023-06-04 ENCOUNTER — Other Ambulatory Visit: Payer: Self-pay | Admitting: Internal Medicine

## 2023-06-04 DIAGNOSIS — E119 Type 2 diabetes mellitus without complications: Secondary | ICD-10-CM

## 2023-07-14 DIAGNOSIS — H353122 Nonexudative age-related macular degeneration, left eye, intermediate dry stage: Secondary | ICD-10-CM | POA: Diagnosis not present

## 2023-07-14 DIAGNOSIS — Z961 Presence of intraocular lens: Secondary | ICD-10-CM | POA: Diagnosis not present

## 2023-07-14 DIAGNOSIS — H401113 Primary open-angle glaucoma, right eye, severe stage: Secondary | ICD-10-CM | POA: Diagnosis not present

## 2023-07-14 DIAGNOSIS — H338 Other retinal detachments: Secondary | ICD-10-CM | POA: Diagnosis not present

## 2023-07-14 DIAGNOSIS — E119 Type 2 diabetes mellitus without complications: Secondary | ICD-10-CM | POA: Diagnosis not present

## 2023-07-14 DIAGNOSIS — H04123 Dry eye syndrome of bilateral lacrimal glands: Secondary | ICD-10-CM | POA: Diagnosis not present

## 2023-07-14 DIAGNOSIS — H401121 Primary open-angle glaucoma, left eye, mild stage: Secondary | ICD-10-CM | POA: Diagnosis not present

## 2023-07-14 LAB — HM DIABETES EYE EXAM

## 2023-07-23 ENCOUNTER — Ambulatory Visit (INDEPENDENT_AMBULATORY_CARE_PROVIDER_SITE_OTHER): Payer: Medicare Other

## 2023-07-23 DIAGNOSIS — Z0000 Encounter for general adult medical examination without abnormal findings: Secondary | ICD-10-CM | POA: Diagnosis not present

## 2023-07-23 NOTE — Progress Notes (Signed)
Subjective:   Lori Harrell is a 82 y.o. female who presents for Medicare Annual (Subsequent) preventive examination.  Visit Complete: Virtual I connected with  Garnette Czech on 07/23/23 by a audio enabled telemedicine application and verified that I am speaking with the correct person using two identifiers.  Patient Location: Home  Provider Location: Office/Clinic  I discussed the limitations of evaluation and management by telemedicine. The patient expressed understanding and agreed to proceed.  Vital Signs: Because this visit was a virtual/telehealth visit, some criteria may be missing or patient reported. Any vitals not documented were not able to be obtained and vitals that have been documented are patient reported.  Cardiac Risk Factors include: advanced age (>63men, >68 women);diabetes mellitus;dyslipidemia;hypertension     Objective:    There were no vitals filed for this visit. There is no height or weight on file to calculate BMI.     07/23/2023   11:02 AM 10/29/2022   12:42 PM 07/19/2022   10:49 AM 04/30/2022    1:39 PM 02/18/2022    6:19 AM 02/06/2022    9:20 AM 10/17/2021    2:52 PM  Advanced Directives  Does Patient Have a Medical Advance Directive? Yes Yes Yes No Yes Yes Yes  Type of Estate agent of [place];Living will Healthcare Power of [place];Living will Healthcare Power of [place];Living will  Living will;Healthcare Power of Attorney Living will;Healthcare Power of Attorney Living will;Healthcare Power of Attorney  Does patient want to make changes to medical advance directive? No - Patient declined    No - Patient declined    Copy of Healthcare Power of Attorney in Chart? Yes - validated most recent copy scanned in chart (See row information)  No - copy requested  Yes - validated most recent copy scanned in chart (See row information) Yes - validated most recent copy scanned in chart (See row information)     Current  Medications (verified) Outpatient Encounter Medications as of 07/23/2023  Medication Sig   acetaminophen (TYLENOL) 500 MG tablet Take 1,000 mg by mouth 2 (two) times daily.   alendronate (FOSAMAX) 70 MG tablet Take 1 tablet (70 mg total) by mouth every 7 (seven) days. Take with a full glass of water on an empty stomach.   atorvastatin (LIPITOR) 20 MG tablet TAKE 1 TABLET DAILY   dorzolamide-timolol (COSOPT) 22.3-6.8 MG/ML ophthalmic solution Place 1 drop into both eyes 2 (two) times daily.   esomeprazole (NEXIUM) 40 MG capsule TAKE 1 CAPSULE TWICE A DAY   FREESTYLE LITE test strip USE TO TEST BLOOD SUGAR DAILY   Lancets (FREESTYLE) lancets USE 1 LANCET DAILY   latanoprost (XALATAN) 0.005 % ophthalmic solution Place 1 drop into both eyes at bedtime.   lisinopril (ZESTRIL) 20 MG tablet Take 1 tablet (20 mg total) by mouth daily.   Menthol, Topical Analgesic, (BIOFREEZE) 10 % LIQD Apply 1 application. topically 3 (three) times daily as needed (pain.).   metFORMIN (GLUCOPHAGE) 500 MG tablet TAKE 1 TABLET DAILY   Multiple Vitamin (MULTIVITAMIN WITH MINERALS) TABS tablet Take 1 tablet by mouth in the morning. Women's Centrum Silver   Multiple Vitamins-Minerals (PRESERVISION AREDS 2 PO) Take by mouth 2 (two) times daily.   nystatin (MYCOSTATIN) 100000 UNIT/ML suspension 1 tsp - swish, gargle and spit 4 times a day   polycarbophil (FIBERCON) 625 MG tablet Take 625 mg by mouth daily.   spironolactone (ALDACTONE) 25 MG tablet TAKE 1 TABLET DAILY   No facility-administered encounter medications on file  as of 07/23/2023.    Allergies (verified) Patient has no known allergies.   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   Barrett's esophagus    Basal cell carcinoma (BCC) of skin of lip 05/31/2021   Choledocholithiasis    Diabetes mellitus without complication (HCC)    type 2    Elevated lipids    Glaucoma    Heart murmur    History of detached retina repair    History of hiatal hernia    Hypercholesterolemia    Hypertension    Osteoporosis    Sigmoid diverticulitis    Unstageable pressure ulcer of back (HCC) 01/07/2019   Vitreous hemorrhage, right eye (HCC) 1992   Past Surgical History:  Procedure Laterality Date   ANTERIOR LATERAL LUMBAR FUSION WITH PERCUTANEOUS SCREW 2 LEVEL N/A 05/19/2019   Procedure: L2-4 LATERAL LUMBAR INTERBODY FUSION (XLIF);  Surgeon: Venetia Night, MD;  Location: [facility];  Service: Neurosurgery;  Laterality: N/A;   CATARACT EXTRACTION Bilateral    CHOLECYSTECTOMY, LAPAROSCOPIC  02/05/2019   [facility]   COLONOSCOPY  2007   sigmoid diverticulosis   ENDOSCOPIC RETROGRADE CHOLANGIOPANCREATOGRAPHY (ERCP) WITH PROPOFOL N/A 02/02/2019   Procedure: ENDOSCOPIC RETROGRADE CHOLANGIOPANCREATOGRAPHY (ERCP) WITH PROPOFOL;  Surgeon: Midge Minium, MD;  Location: [facility];  Service: Endoscopy;  Laterality: N/A;   ESOPHAGOGASTRODUODENOSCOPY  04/22/2016   ESOPHAGOGASTRODUODENOSCOPY  04/26/2019   ESOPHAGOGASTRODUODENOSCOPY  09/15/2019   REVERSE SHOULDER ARTHROPLASTY Left 02/18/2022   Procedure: Left reverse shoulder arthroplasty, biceps tenodesis;  Surgeon: Signa Kell, MD;  Location: [facility];  Service: Orthopedics;  Laterality: Left;   TONSILLECTOMY     UPPER GASTROINTESTINAL ENDOSCOPY  02/2013   Barrett's - done at [facility]   VAGINAL HYSTERECTOMY     Family History  Problem Relation Age of Onset   Diabetes Father    CAD Father    Breast cancer Neg Hx    Social History   Socioeconomic History   Marital status: Widowed    Spouse name: Not on file   Number of children: 2   Years of education: Not on file   Highest education level: 12th grade  Occupational History   Occupation: Retired  Tobacco Use   Smoking status: Never   Smokeless tobacco: Never   Tobacco comments:    smoking cessation materials not required  Vaping Use    Vaping status: Never Used  Substance and Sexual Activity   Alcohol use: No    Alcohol/week: 0.0 standard drinks of alcohol   Drug use: No   Sexual activity: Not Currently  Other Topics Concern   Not on file  Social History Narrative   Lives alone   Social Determinants of Health   Financial Resource Strain: Low Risk  (07/23/2023)   Overall Financial Resource Strain (CARDIA)    Difficulty of Paying Living Expenses: Not hard at all  Food Insecurity: No Food Insecurity (07/23/2023)   Hunger Vital Sign    Worried About Running Out of Food in the Last Year: Never true    Ran Out of Food in the Last Year: Never true  Transportation Needs: No Transportation Needs (07/23/2023)   PRAPARE - Administrator, Civil Service (Medical): No    Lack of Transportation (Non-Medical): No  Physical Activity: Sufficiently Active (07/23/2023)   Exercise Vital Sign    Days of Exercise per Week: 5 days  Minutes of Exercise per Session: 30 min  Stress: No Stress Concern Present (07/23/2023)   [employer] of Occupational Health - Occupational Stress Questionnaire    Feeling of Stress : Not at all  Social Connections: Moderately Isolated (07/23/2023)   Social Connection and Isolation Panel [NHANES]    Frequency of Communication with Friends and Family: More than three times a week    Frequency of Social Gatherings with Friends and Family: Once a week    Attends Religious Services: More than 4 times per year    Active Member of [employer] or Organizations: No    Attends Banker Meetings: Never    Marital Status: Widowed    Tobacco Counseling Counseling given: Not Answered Tobacco comments: smoking cessation materials not required   Clinical Intake:  Pre-visit preparation completed: Yes  Pain : No/denies pain     Nutritional Status: BMI of 19-24  Normal Nutritional Risks: None Diabetes: Yes CBG done?: No Did pt. bring in CBG monitor from home?: No  How often do  you need to have someone help you when you read instructions, pamphlets, or other written materials from your doctor or pharmacy?: 1 - Never  Interpreter Needed?: No  Information entered by :: Kennedy Bucker, LPN   Activities of Daily Living    07/23/2023   11:05 AM  In your present state of health, do you have any difficulty performing the following activities:  Hearing? 0  Vision? 1  Difficulty concentrating or making decisions? 0  Walking or climbing stairs? 1  Dressing or bathing? 0  Doing errands, shopping? 0  Preparing Food and eating ? N  Using the Toilet? N  In the past six months, have you accidently leaked urine? N  Do you have problems with loss of bowel control? N  Managing your Medications? N  Managing your Finances? N  Housekeeping or managing your Housekeeping? N    Patient Care Team: Reubin Milan, MD as PCP - General (Internal Medicine) Marcell Barlow Kinnie Scales, MD as Consulting Physician (Ophthalmology) Theodore Demark, Alden Benjamin, MD as Consulting Physician (Gastroenterology) Lyndle Herrlich, MD as Consulting Physician (Orthopedic Surgery) Venetia Night, MD as Consulting Physician (Neurosurgery)  Indicate any recent Medical Services you may have received from other than [facility] providers in the past year (date may be approximate).     Assessment:   This is a routine wellness examination for Lori Harrell.  Hearing/Vision screen Hearing Screening - Comments:: No aids Vision Screening - Comments:: Wears glasses- glaucoma &amp; macular degeneration- Dr.Lu   Goals Addressed             This Visit's Progress    Cut out extra servings         Depression Screen    07/23/2023   11:01 AM 04/21/2023    3:18 PM 12/17/2022    9:57 AM 10/04/2022   10:22 AM 08/27/2022    9:29 AM 07/19/2022   10:49 AM 04/22/2022    9:44 AM  PHQ 2/9 Scores  PHQ - 2 Score 0 0 0 2 2 0 0  PHQ- 9 Score 0 2 0 5 4 4 2     Fall Risk    07/23/2023   11:04 AM 04/21/2023    3:18 PM  12/17/2022    9:58 AM 10/04/2022   10:22 AM 08/27/2022    9:29 AM  Fall Risk   Falls in the past year? 1 1 1 1 1   Number falls in past yr: 1 0 1 1 0  Injury with Fall? 1 1 1 1 1   Risk for fall due to : History of fall(s) History of fall(s) History of fall(s) History of fall(s) History of fall(s)  Follow up Falls prevention discussed;Falls evaluation completed Falls evaluation completed Falls evaluation completed Falls evaluation completed Falls evaluation completed    MEDICARE RISK AT HOME: Medicare Risk at Home Any stairs in or around the home?: No If so, are there any without handrails?: No Home free of loose throw rugs in walkways, pet beds, electrical cords, etc?: Yes Adequate lighting in your home to reduce risk of falls?: Yes Life alert?: No Use of a cane, walker or w/c?: Yes (cane when out and about) Grab bars in the bathroom?: Yes Shower chair or bench in shower?: Yes Elevated toilet seat or a handicapped toilet?: Yes  TIMED UP AND GO:  Was the test performed?  No    Cognitive Function:        07/23/2023   11:06 AM 07/19/2022   10:51 AM 07/17/2020    8:59 AM 07/14/2019    9:06 AM 07/08/2018   10:34 AM  6CIT Screen  What Year? 0 points 0 points 0 points 0 points 0 points  What month? 0 points 0 points 0 points 0 points 0 points  What time? 0 points 0 points 0 points 0 points 0 points  Count back from 20 0 points 0 points 0 points 0 points 0 points  Months in reverse 0 points 0 points 0 points 0 points 0 points  Repeat phrase 0 points 2 points 0 points 2 points 0 points  Total Score 0 points 2 points 0 points 2 points 0 points    Immunizations Immunization History  Administered Date(s) Administered   Fluad Quad(high Dose 65+) 08/12/2019, 08/28/2020, 05/31/2021, 08/27/2022   Influenza, High Dose Seasonal PF 07/07/2017, 07/08/2018   Influenza, Seasonal, Injecte, Preservative Fre 08/19/2013   Influenza,inj,Quad PF,6+ Mos 06/23/2015, 07/04/2016    Influenza-Unspecified 06/18/2011, 08/21/2012   PFIZER(Purple Top)SARS-COV-2 Vaccination 12/24/2019, 01/14/2020, 10/13/2020   Pneumococcal Conjugate-13 06/20/2014   Pneumococcal Polysaccharide-23 10/02/2007   Tdap 10/01/2010    TDAP status: Due, Education has been provided regarding the importance of this vaccine. Advised may receive this vaccine at local pharmacy or Health Dept. Aware to provide a copy of the vaccination record if obtained from local pharmacy or Health Dept. Verbalized acceptance and understanding.  Flu Vaccine status: Due, Education has been provided regarding the importance of this vaccine. Advised may receive this vaccine at local pharmacy or Health Dept. Aware to provide a copy of the vaccination record if obtained from local pharmacy or Health Dept. Verbalized acceptance and understanding.  Pneumococcal vaccine status: Declined,  Education has been provided regarding the importance of this vaccine but patient still declined. Advised may receive this vaccine at local pharmacy or Health Dept. Aware to provide a copy of the vaccination record if obtained from local pharmacy or Health Dept. Verbalized acceptance and understanding.   Covid-19 vaccine status: Completed vaccines  Qualifies for Shingles Vaccine? Yes   Zostavax completed No   Shingrix Completed?: No.    Education has been provided regarding the importance of this vaccine. Patient has been advised to call insurance company to determine out of pocket expense if they have not yet received this vaccine. Advised may also receive vaccine at local pharmacy or Health Dept. Verbalized acceptance and understanding.  Screening Tests Health Maintenance  Topic Date Due   Zoster Vaccines- Shingrix (1 of 2) Never done  DTaP/Tdap/Td (2 - Td or Tdap) 10/01/2020   INFLUENZA VACCINE  05/01/2023   COVID-19 Vaccine (4 - 2023-24 season) 06/01/2023   OPHTHALMOLOGY EXAM  07/02/2023   HEMOGLOBIN A1C  10/22/2023   Diabetic kidney  evaluation - eGFR measurement  12/17/2023   Diabetic kidney evaluation - Urine ACR  12/17/2023   FOOT EXAM  12/17/2023   Medicare Annual Wellness (AWV)  07/22/2024   Pneumonia Vaccine 28+ Years old  Completed   DEXA SCAN  Completed   HPV VACCINES  Aged Out   Hepatitis C Screening  Discontinued    Health Maintenance  Health Maintenance Due  Topic Date Due   Zoster Vaccines- Shingrix (1 of 2) Never done   DTaP/Tdap/Td (2 - Td or Tdap) 10/01/2020   INFLUENZA VACCINE  05/01/2023   COVID-19 Vaccine (4 - 2023-24 season) 06/01/2023   OPHTHALMOLOGY EXAM  07/02/2023    Colorectal cancer screening: No longer required.   Mammogram status: No longer required due to age.  Bone Density status: Completed 02/04/22. Results reflect: Bone density results: OSTEOPOROSIS. Repeat every 2 years.  Lung Cancer Screening: (Low Dose CT Chest recommended if Age 19-80 years, 20 pack-year currently smoking OR have quit w/in 15years.) does not qualify.    Additional Screening:  Hepatitis C Screening: does not qualify; Completed 04/10/20  Vision Screening: Recommended annual ophthalmology exams for early detection of glaucoma and other disorders of the eye. Is the patient up to date with their annual eye exam?  Yes  Who is the provider or what is the name of the office in which the patient attends annual eye exams? Dr.Lu If pt is not established with a provider, would they like to be referred to a provider to establish care? No .   Dental Screening: Recommended annual dental exams for proper oral hygiene  Diabetic Foot Exam: Diabetic Foot Exam: Completed 12/17/22  Community Resource Referral / Chronic Care Management: CRR required this visit?  No   CCM required this visit?  No     Plan:     I have personally reviewed and noted the following in the patient's chart:   Medical and social history Use of alcohol, tobacco or illicit drugs  Current medications and supplements including opioid  prescriptions. Patient is not currently taking opioid prescriptions. Functional ability and status Nutritional status Physical activity Advanced directives List of other physicians Hospitalizations, surgeries, and ER visits in previous 12 months Vitals Screenings to include cognitive, depression, and falls Referrals and appointments  In addition, I have reviewed and discussed with patient certain preventive protocols, quality metrics, and best practice recommendations. A written personalized care plan for preventive services as well as general preventive health recommendations were provided to patient.     Hal Hope, LPN   16/07/[id number]   After Visit Summary: (MyChart) Due to this being a telephonic visit, the after visit summary with patients personalized plan was offered to patient via MyChart   Nurse Notes: none

## 2023-07-23 NOTE — Patient Instructions (Addendum)
Lori Harrell , Thank you for taking time to come for your Medicare Wellness Visit. I appreciate your ongoing commitment to your health goals. Please review the following plan we discussed and let me know if I can assist you in the future.   Referrals/Orders/Follow-Ups/Clinician Recommendations: none  This is a list of the screening recommended for you and due dates:  Health Maintenance  Topic Date Due   Zoster (Shingles) Vaccine (1 of 2) Never done   DTaP/Tdap/Td vaccine (2 - Td or Tdap) 10/01/2020   Flu Shot  05/01/2023   COVID-19 Vaccine (4 - 2023-24 season) 06/01/2023   Eye exam for diabetics  07/02/2023   Hemoglobin A1C  10/22/2023   Yearly kidney function blood test for diabetes  12/17/2023   Yearly kidney health urinalysis for diabetes  12/17/2023   Complete foot exam   12/17/2023   Medicare Annual Wellness Visit  07/22/2024   Pneumonia Vaccine  Completed   DEXA scan (bone density measurement)  Completed   HPV Vaccine  Aged Out   Hepatitis C Screening  Discontinued    Advanced directives: (In Chart) A copy of your advanced directives are scanned into your chart should your provider ever need it.  Next Medicare Annual Wellness Visit scheduled for next year: Yes   07/28/24 @ 3:00 pm in person

## 2023-08-21 ENCOUNTER — Ambulatory Visit (INDEPENDENT_AMBULATORY_CARE_PROVIDER_SITE_OTHER): Payer: Medicare Other | Admitting: Internal Medicine

## 2023-08-21 ENCOUNTER — Encounter: Payer: Self-pay | Admitting: Internal Medicine

## 2023-08-21 VITALS — BP 128/76 | HR 74 | Ht 61.0 in | Wt 132.0 lb

## 2023-08-21 DIAGNOSIS — Z7984 Long term (current) use of oral hypoglycemic drugs: Secondary | ICD-10-CM | POA: Diagnosis not present

## 2023-08-21 DIAGNOSIS — Z23 Encounter for immunization: Secondary | ICD-10-CM

## 2023-08-21 DIAGNOSIS — K2270 Barrett's esophagus without dysplasia: Secondary | ICD-10-CM | POA: Diagnosis not present

## 2023-08-21 DIAGNOSIS — E118 Type 2 diabetes mellitus with unspecified complications: Secondary | ICD-10-CM

## 2023-08-21 DIAGNOSIS — I10 Essential (primary) hypertension: Secondary | ICD-10-CM

## 2023-08-21 DIAGNOSIS — M461 Sacroiliitis, not elsewhere classified: Secondary | ICD-10-CM | POA: Insufficient documentation

## 2023-08-21 LAB — POCT GLYCOSYLATED HEMOGLOBIN (HGB A1C): Hemoglobin A1C: 5.8 % — AB (ref 4.0–5.6)

## 2023-08-21 NOTE — Assessment & Plan Note (Addendum)
Blood sugars stable without hypoglycemic symptoms or events. Currently managed with metformin daily. Changes made last visit are none. Lab Results  Component Value Date   HGBA1C 5.8 (A) 04/21/2023  A1C stable at 5.8.  continue current medications.

## 2023-08-21 NOTE — Assessment & Plan Note (Signed)
Reflux symptoms are minimal on current therapy - nexium bid. No red flag signs such as weight loss, n/v, melena

## 2023-08-21 NOTE — Assessment & Plan Note (Signed)
 Controlled BP with normal exam. Current regimen is lisinopril. Will continue same medications; encourage continued reduced sodium diet.

## 2023-08-21 NOTE — Progress Notes (Signed)
Date:  08/21/2023   Name:  Lori Harrell   DOB:  [birth year]   MRN:  [id number]   Chief Complaint: Diabetes and Hypertension  Diabetes She presents for her follow-up diabetic visit. She has type 2 diabetes mellitus. Her disease course has been stable. Pertinent negatives for hypoglycemia include no headaches or tremors. Pertinent negatives for diabetes include no chest pain, no fatigue, no polydipsia and no polyuria. Current diabetic treatment includes oral agent (monotherapy) (metformin).  Hypertension Pertinent negatives include no chest pain, headaches, palpitations or shortness of breath.    Review of Systems  Constitutional:  Negative for appetite change, fatigue, fever and unexpected weight change.  HENT:  Negative for tinnitus and trouble swallowing.   Eyes:  Negative for visual disturbance.  Respiratory:  Negative for cough, chest tightness and shortness of breath.   Cardiovascular:  Negative for chest pain, palpitations and leg swelling.  Gastrointestinal:  Negative for abdominal pain.  Endocrine: Negative for polydipsia and polyuria.  Genitourinary:  Negative for dysuria and hematuria.  Musculoskeletal:  Negative for arthralgias.  Neurological:  Negative for tremors, numbness and headaches.  Psychiatric/Behavioral:  Negative for dysphoric mood.      Lab Results  Component Value Date   NA 140 12/17/2022   K 4.7 12/17/2022   CO2 20 12/17/2022   GLUCOSE 102 (H) 12/17/2022   BUN 15 12/17/2022   CREATININE 0.71 12/17/2022   CALCIUM 9.8 12/17/2022   EGFR 85 12/17/2022   GFRNONAA >60 02/06/2022   Lab Results  Component Value Date   CHOL 155 12/17/2022   HDL 49 12/17/2022   LDLCALC 89 12/17/2022   TRIG 88 12/17/2022   CHOLHDL 3.2 12/17/2022   Lab Results  Component Value Date   TSH 5.620 (H) 12/17/2022   Lab Results  Component Value Date   HGBA1C 5.8 (A) 08/21/2023   Lab Results  Component Value Date   WBC 7.1 12/17/2022   HGB 12.0 12/17/2022    HCT 34.8 12/17/2022   MCV 92 12/17/2022   PLT 222 12/17/2022   Lab Results  Component Value Date   ALT 19 12/17/2022   AST 23 12/17/2022   ALKPHOS 83 12/17/2022   BILITOT 0.3 12/17/2022   Lab Results  Component Value Date   VD25OH 61.0 12/14/2021     Patient Active Problem List   Diagnosis Date Noted   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/27/2022   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   Basal cell carcinoma (BCC) of skin of lip 05/31/2021   Elevated TSH 12/15/2020   S/P lumbar fusion 05/19/2019   Red blood cell antibody positive 02/03/2019   DDD (degenerative disc disease), lumbar 01/07/2019   Carotid artery stenosis, asymptomatic, bilateral 07/07/2017   Primary open angle glaucoma 05/08/2017   Old partial retinal detachment 05/08/2017   Large hiatal hernia 07/04/2016   Sigmoid diverticulitis 07/04/2016   Stress incontinence 07/04/2016   Type II diabetes mellitus with complication (HCC) 10/31/2015   Constipation 10/31/2015   Barrett's esophagus without dysplasia 06/16/2015   Essential (primary) hypertension 06/16/2015   Hyperlipidemia associated with type 2 diabetes mellitus (HCC) 06/16/2015   OP (osteoporosis) 06/16/2015   Hypercalcemia 04/22/2012    No Known Allergies  Past Surgical History:  Procedure Laterality Date   ANTERIOR LATERAL LUMBAR FUSION WITH PERCUTANEOUS SCREW 2 LEVEL N/A 05/19/2019   Procedure: L2-4 LATERAL LUMBAR INTERBODY FUSION (XLIF);  Surgeon: Venetia Night, MD;  Location: [facility] ORS;  Service: Neurosurgery;  Laterality: N/A;   CATARACT EXTRACTION Bilateral    CHOLECYSTECTOMY, LAPAROSCOPIC  02/05/2019   [facility]   COLONOSCOPY  2007   sigmoid diverticulosis   ENDOSCOPIC RETROGRADE CHOLANGIOPANCREATOGRAPHY (ERCP) WITH PROPOFOL N/A 02/02/2019   Procedure: ENDOSCOPIC RETROGRADE CHOLANGIOPANCREATOGRAPHY (ERCP) WITH PROPOFOL;  Surgeon: Midge Minium, MD;  Location: [facility];  Service:  Endoscopy;  Laterality: N/A;   ESOPHAGOGASTRODUODENOSCOPY  04/22/2016   ESOPHAGOGASTRODUODENOSCOPY  04/26/2019   ESOPHAGOGASTRODUODENOSCOPY  09/15/2019   REVERSE SHOULDER ARTHROPLASTY Left 02/18/2022   Procedure: Left reverse shoulder arthroplasty, biceps tenodesis;  Surgeon: Signa Kell, MD;  Location: [facility] OR[facility];  Service: Orthopedics;  Laterality: Left;   TONSILLECTOMY     UPPER GASTROINTESTINAL ENDOSCOPY  02/2013   Barrett's - done at [facility]   VAGINAL HYSTERECTOMY      Social History   Tobacco Use   Smoking status: Never   Smokeless tobacco: Never   Tobacco comments:    smoking cessation materials not required  Vaping Use   Vaping status: Never Used  Substance Use Topics   Alcohol use: No    Alcohol/week: 0.0 standard drinks of alcohol   Drug use: No     Medication list has been reviewed and updated.  Current Meds  Medication Sig   acetaminophen (TYLENOL) 500 MG tablet Take 1,000 mg by mouth 2 (two) times daily.   alendronate (FOSAMAX) 70 MG tablet Take 1 tablet (70 mg total) by mouth every 7 (seven) days. Take with a full glass of water on an empty stomach.   atorvastatin (LIPITOR) 20 MG tablet TAKE 1 TABLET DAILY   dorzolamide-timolol (COSOPT) 22.3-6.8 MG/ML ophthalmic solution Place 1 drop into both eyes 2 (two) times daily.   esomeprazole (NEXIUM) 40 MG capsule TAKE 1 CAPSULE TWICE A DAY   FREESTYLE LITE test strip USE TO TEST BLOOD SUGAR DAILY   Lancets (FREESTYLE) lancets USE 1 LANCET DAILY   latanoprost (XALATAN) 0.005 % ophthalmic solution Place 1 drop into both eyes at bedtime.   lisinopril (ZESTRIL) 20 MG tablet Take 1 tablet (20 mg total) by mouth daily.   Menthol, Topical Analgesic, (BIOFREEZE) 10 % LIQD Apply 1 application. topically 3 (three) times daily as needed (pain.).   metFORMIN (GLUCOPHAGE) 500 MG tablet TAKE 1 TABLET DAILY   Multiple Vitamin (MULTIVITAMIN WITH MINERALS) TABS tablet Take 1 tablet by mouth in the morning. Women's Centrum Silver    Multiple Vitamins-Minerals (PRESERVISION AREDS 2 PO) Take by mouth 2 (two) times daily.   nystatin (MYCOSTATIN) 100000 UNIT/ML suspension 1 tsp - swish, gargle and spit 4 times a day   polycarbophil (FIBERCON) 625 MG tablet Take 625 mg by mouth daily.   spironolactone (ALDACTONE) 25 MG tablet TAKE 1 TABLET DAILY       04/21/2023    3:18 PM 12/17/2022    9:58 AM 10/04/2022   10:22 AM 08/27/2022    9:29 AM  GAD 7 : Generalized Anxiety Score  Nervous, Anxious, on Edge 0 0 0 0  Control/stop worrying 0 0 0 0  Worry too much - different things 0 0 0 0  Trouble relaxing 0 0 0 0  Restless 0 0 0 0  Easily annoyed or irritable 0 0 0 0  Afraid - awful might happen 0 0 0 0  Total GAD 7 Score 0 0 0 0  Anxiety Difficulty Not difficult at all Not difficult at all Not difficult at all Not difficult at all       07/23/2023   11:01 AM 04/21/2023  3:18 PM 12/17/2022    9:57 AM  Depression screen PHQ 2/9  Decreased Interest 0 0 0  Down, Depressed, Hopeless 0 0 0  PHQ - 2 Score 0 0 0  Altered sleeping 0 1 0  Tired, decreased energy 0 1 0  Change in appetite 0 0 0  Feeling bad or failure about yourself  0 0 0  Trouble concentrating 0 0 0  Moving slowly or fidgety/restless 0 0 0  Suicidal thoughts 0 0 0  PHQ-9 Score 0 2 0  Difficult doing work/chores Not difficult at all Not difficult at all Not difficult at all    BP Readings from Last 3 Encounters:  08/21/23 128/76  04/21/23 120/68  12/19/22 (!) 153/75    Physical Exam Vitals and nursing note reviewed.  Constitutional:      General: She is not in acute distress.    Appearance: She is well-developed.  HENT:     Head: Normocephalic and atraumatic.  Neck:     Vascular: No carotid bruit.  Cardiovascular:     Rate and Rhythm: Normal rate and regular rhythm.     Heart sounds: No murmur heard. Pulmonary:     Effort: Pulmonary effort is normal. No respiratory distress.     Breath sounds: No wheezing or rhonchi.  Musculoskeletal:      Cervical back: Normal range of motion.     Right lower leg: No edema.     Left lower leg: No edema.  Lymphadenopathy:     Cervical: No cervical adenopathy.  Skin:    General: Skin is warm and dry.     Findings: No rash.  Neurological:     General: No focal deficit present.     Mental Status: She is alert and oriented to person, place, and time.     Gait: Gait abnormal (uses cane).  Psychiatric:        Mood and Affect: Mood normal.        Behavior: Behavior normal.     Wt Readings from Last 3 Encounters:  08/21/23 132 lb (59.9 kg)  04/21/23 128 lb (58.1 kg)  12/17/22 126 lb 6.4 oz (57.3 kg)    BP 128/76   Pulse 74   Ht 5\' 1"  (1.549 m)   Wt 132 lb (59.9 kg)   SpO2 95%   BMI 24.94 kg/m   Assessment and Plan:  Problem List Items Addressed This Visit       Unprioritized   Barrett's esophagus without dysplasia (Chronic)    Reflux symptoms are minimal on current therapy - nexium bid. No red flag signs such as weight loss, n/v, melena       Essential (primary) hypertension (Chronic)    Controlled BP with normal exam. Current regimen is lisinopril. Will continue same medications; encourage continued reduced sodium diet.       SI joint arthritis (HCC)   Type II diabetes mellitus with complication (HCC) - Primary (Chronic)    Blood sugars stable without hypoglycemic symptoms or events. Currently managed with metformin daily. Changes made last visit are none. Lab Results  Component Value Date   HGBA1C 5.8 (A) 04/21/2023  A1C stable at 5.8.  continue current medications.       Relevant Orders   POCT glycosylated hemoglobin (Hb A1C) (Completed)   Other Visit Diagnoses     Need for influenza vaccination       Relevant Orders   Flu Vaccine Trivalent High Dose (Fluad) (Completed)   Long term current  use of oral hypoglycemic drug           Return in about 4 months (around 12/19/2023) for Medicare annual.    Reubin Milan, MD [facility] [place]

## 2023-08-22 ENCOUNTER — Encounter: Payer: Self-pay | Admitting: Podiatry

## 2023-08-22 ENCOUNTER — Ambulatory Visit: Payer: Medicare Other | Admitting: Podiatry

## 2023-08-22 VITALS — Ht 61.0 in | Wt 132.0 lb

## 2023-08-22 DIAGNOSIS — M79675 Pain in left toe(s): Secondary | ICD-10-CM | POA: Diagnosis not present

## 2023-08-22 DIAGNOSIS — B351 Tinea unguium: Secondary | ICD-10-CM

## 2023-08-22 DIAGNOSIS — E118 Type 2 diabetes mellitus with unspecified complications: Secondary | ICD-10-CM | POA: Diagnosis not present

## 2023-08-22 DIAGNOSIS — M79674 Pain in right toe(s): Secondary | ICD-10-CM

## 2023-08-22 NOTE — Progress Notes (Addendum)
  Subjective:  Patient ID: Lori Harrell, female    DOB: Jan 31, 1941,  MRN: [id number]  82 y.o. female presents to clinic with  preventative diabetic foot care and painful thick toenails that are difficult to trim. Pain interferes with ambulation. Aggravating factors include wearing enclosed shoe gear. Pain is relieved with periodic professional debridement.  Chief Complaint  Patient presents with   Nail Problem    Pt is here for [facility], last A1C was 5.8, PCP is Dr Judithann Graves and LOV was yesterday.    Patient states her left 5th toe is sore today.  PCP is Reubin Milan, MD.  No Known Allergies  Review of Systems: Negative except as noted in the HPI.   Objective:  Lori Harrell is a pleasant 82 y.o. female WD, WN in NAD.Marland Kitchen  Vascular Examination: Vascular status intact b/l with palpable pedal pulses. CFT immediate b/l. No edema. No pain with calf compression b/l. Skin temperature gradient WNL b/l. No cyanosis or clubbing noted b/l LE.  Neurological Examination: Sensation grossly intact b/l with 10 gram monofilament. Vibratory sensation intact b/l.   Dermatological Examination: Pedal skin with normal turgor, texture and tone b/l. Toenails 1-5 b/l thick, discolored, elongated with subungual debris and pain on dorsal palpation. No hyperkeratotic lesions noted b/l.   Incurvated nailplate left 5th toe medial border(s) with tenderness to palpation. No erythema, no edema, no drainage noted.   Musculoskeletal Examination: Muscle strength 5/5 to b/l LE. 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 Plantarflexed metatarsals midfoot left foot. Patient ambulates with cane assistance.  Radiographs: None  Last A1c:      Latest Ref Rng & Units 08/21/2023   11:01 AM 04/21/2023    3:28 PM 12/17/2022   10:29 AM  Hemoglobin A1C  Hemoglobin-A1c 4.0 - 5.6 % 5.8  5.8  6.2      Assessment:   1. Pain due to onychomycosis of toenails of both feet   2. Type II diabetes mellitus  with complication (HCC)    Plan:  -Patient was evaluated today. All questions/concerns addressed on today's visit. -Continue foot and shoe inspections daily. Monitor blood glucose per PCP/Endocrinologist's recommendations. -Continue supportive shoe gear daily. -Mycotic toenails 1-5 bilaterally were debrided in length and girth with sterile nail nippers and dremel without incident. -No invasive procedure(s) performed. Offending nail border debrided and curretaged L 5th toe utilizing sterile nail nipper and currette. Border cleansed with alcohol and triple antibiotic ointment applied. No further treatment required by patient/caregiver. Call office if there are any concerns. -Patient/POA to call should there be question/concern in the interim.  Return in about 3 months (around 11/22/2023).  Freddie Breech, DPM      [place] LOCATION: [address] [place] [address]                   Office [phone]   [facility] LOCATION: [address] [place], [place] [address] Office [phone]

## 2023-10-15 ENCOUNTER — Other Ambulatory Visit: Payer: Self-pay | Admitting: Internal Medicine

## 2023-10-16 NOTE — Telephone Encounter (Signed)
Requested Prescriptions  Pending Prescriptions Disp Refills   FREESTYLE LITE test strip [Pharmacy Med Name: FREESTYLE LITE STRIPS 50'S] 100 strip 3    Sig: USE TO TEST BLOOD SUGAR DAILY     Endocrinology: Diabetes - Testing Supplies Passed - 10/16/2023 12:12 PM      Passed - Valid encounter within last 12 months    Recent Outpatient Visits           1 month ago Type II diabetes mellitus with complication [facility])   [place] [facility] at [facility], Nyoka Cowden, MD   5 months ago Essential (primary) hypertension   [place] [facility] at [facility], Nyoka Cowden, MD   10 months ago Essential (primary) hypertension   [place] [facility] at [facility], Nyoka Cowden, MD   1 year ago Upper respiratory tract infection, unspecified type   [facility] at [facility], Nyoka Cowden, MD   1 year ago Type II diabetes mellitus with complication [facility])   [place] [facility] at [facility] [place], Nyoka Cowden, MD       Future Appointments             In 2 months Judithann Graves, Nyoka Cowden, MD [facility] at [facility]

## 2023-10-22 ENCOUNTER — Other Ambulatory Visit: Payer: Self-pay | Admitting: Internal Medicine

## 2023-10-22 DIAGNOSIS — E118 Type 2 diabetes mellitus with unspecified complications: Secondary | ICD-10-CM

## 2023-10-22 DIAGNOSIS — E1169 Type 2 diabetes mellitus with other specified complication: Secondary | ICD-10-CM

## 2023-10-22 DIAGNOSIS — K2270 Barrett's esophagus without dysplasia: Secondary | ICD-10-CM

## 2023-10-22 DIAGNOSIS — I10 Essential (primary) hypertension: Secondary | ICD-10-CM

## 2023-10-22 MED ORDER — ATORVASTATIN CALCIUM 20 MG PO TABS: 20.0000 mg | ORAL_TABLET | Freq: Every day | ORAL | 0 refills | Status: DC

## 2023-10-22 MED ORDER — ESOMEPRAZOLE MAGNESIUM 40 MG PO CPDR: 40.0000 mg | DELAYED_RELEASE_CAPSULE | Freq: Two times a day (BID) | ORAL | 0 refills | Status: DC

## 2023-10-22 MED ORDER — METFORMIN HCL 500 MG PO TABS: 500.0000 mg | ORAL_TABLET | Freq: Every day | ORAL | 1 refills | Status: DC

## 2023-10-22 MED ORDER — SPIRONOLACTONE 25 MG PO TABS: 25.0000 mg | ORAL_TABLET | Freq: Every day | ORAL | 3 refills | Status: DC

## 2023-10-22 MED ORDER — LISINOPRIL 20 MG PO TABS: 20.0000 mg | ORAL_TABLET | Freq: Every day | ORAL | 3 refills | Status: DC

## 2023-10-22 NOTE — Telephone Encounter (Signed)
Requested medication (s) are due for refill today: yes  Requested medication (s) are on the active medication list: yes  Last refill:  spironolactone 09/23/22 #90/3, lisinopril 10/04/22 #90/3  /Future visit scheduled: yes  Notes to clinic:  Unable to refill per protocol due to failed labs, no updated results.      Requested Prescriptions  Pending Prescriptions Disp Refills   spironolactone (ALDACTONE) 25 MG tablet 90 tablet 3    Sig: Take 1 tablet (25 mg total) by mouth daily.     Cardiovascular: Diuretics - Aldosterone Antagonist Failed - 10/22/2023  2:49 PM      Failed - Cr in normal range and within 180 days    Creatinine, Ser  Date Value Ref Range Status  12/17/2022 0.71 0.57 - 1.00 mg/dL Final         Failed - K in normal range and within 180 days    Potassium  Date Value Ref Range Status  12/17/2022 4.7 3.5 - 5.2 mmol/L Final         Failed - Na in normal range and within 180 days    Sodium  Date Value Ref Range Status  12/17/2022 140 134 - 144 mmol/L Final         Failed - eGFR is 30 or above and within 180 days    GFR calc Af Amer  Date Value Ref Range Status  08/28/2020 72 >59 mL/min/1.73 Final    Comment:    **In accordance with recommendations from the NKF-ASN Task force,**   [facility] is in the process of updating its eGFR calculation to the   2021 CKD-EPI creatinine equation that estimates kidney function   without a race variable.    GFR, Estimated  Date Value Ref Range Status  02/06/2022 >60 >60 mL/min Final    Comment:    (NOTE) Calculated using the CKD-EPI Creatinine Equation (2021)    eGFR  Date Value Ref Range Status  12/17/2022 85 >59 mL/min/1.73 Final         Passed - Last BP in normal range    BP Readings from Last 1 Encounters:  08/21/23 128/76         Passed - Valid encounter within last 6 months    Recent Outpatient Visits           2 months ago Type II diabetes mellitus with complication [facility])   [place] [facility]  Sports Medicine at [facility], Nyoka Cowden, MD   6 months ago Essential (primary) hypertension   [place] Primary Care & Sports Medicine at [facility], Nyoka Cowden, MD   10 months ago Essential (primary) hypertension   [place] Primary [facility] & Sports [facility] at [facility], Nyoka Cowden, MD   1 year ago Upper respiratory tract infection, unspecified type   [facility] & Sports [facility] at [facility], Nyoka Cowden, MD   1 year ago Type II diabetes mellitus with complication [facility])    Primary Care & Sports Medicine at [facility], Nyoka Cowden, MD       Future Appointments             In 2 months Reubin Milan, MD [facility] Health Primary Care & Sports [facility] at [facility]             lisinopril (ZESTRIL) 20 MG tablet 90 tablet 3    Sig: Take 1 tablet (20 mg total) by mouth daily.  Cardiovascular:  ACE Inhibitors Failed - 10/22/2023  2:49 PM      Failed - Cr in normal range and within 180 days    Creatinine, Ser  Date Value Ref Range Status  12/17/2022 0.71 0.57 - 1.00 mg/dL Final         Failed - K in normal range and within 180 days    Potassium  Date Value Ref Range Status  12/17/2022 4.7 3.5 - 5.2 mmol/L Final         Passed - Patient is not pregnant      Passed - Last BP in normal range    BP Readings from Last 1 Encounters:  08/21/23 128/76         Passed - Valid encounter within last 6 months    Recent Outpatient Visits           2 months ago Type II diabetes mellitus with complication [facility])   [place] [facility] & Sports Medicine at [facility], Nyoka Cowden, MD   6 months ago Essential (primary) hypertension   [place] Primary Care & Sports Medicine at [facility], Nyoka Cowden, MD   10 months ago Essential (primary) hypertension   [place] Primary Care & Sports Medicine at [facility], Nyoka Cowden, MD   1 year ago Upper  respiratory tract infection, unspecified type   [facility] Primary Care & Sports Medicine at [facility], Nyoka Cowden, MD   1 year ago Type II diabetes mellitus with complication [facility])   [place] Primary Care & Sports Medicine at [facility], Nyoka Cowden, MD       Future Appointments             In 2 months Reubin Milan, MD [facility] Primary Care & Sports Medicine at [facility], [facility]            Signed Prescriptions Disp Refills   metFORMIN (GLUCOPHAGE) 500 MG tablet 90 tablet 1    Sig: Take 1 tablet (500 mg total) by mouth daily.     Endocrinology:  Diabetes - Biguanides Failed - 10/22/2023  2:49 PM      Failed - B12 Level in normal range and within 720 days    No results found for: "VITAMINB12"       Passed - Cr in normal range and within 360 days    Creatinine, Ser  Date Value Ref Range Status  12/17/2022 0.71 0.57 - 1.00 mg/dL Final         Passed - HBA1C is between 0 and 7.9 and within 180 days    Hemoglobin A1C  Date Value Ref Range Status  08/21/2023 5.8 (A) 4.0 - 5.6 % Final   Hgb A1c MFr Bld  Date Value Ref Range Status  12/17/2022 6.2 (H) 4.8 - 5.6 % Final    Comment:             Prediabetes: 5.7 - 6.4          Diabetes: >6.4          Glycemic control for adults with diabetes: <7.0          Passed - eGFR in normal range and within 360 days    GFR calc Af Amer  Date Value Ref Range Status  08/28/2020 72 >59 mL/min/1.73 Final    Comment:    **In accordance with recommendations from the NKF-ASN Task force,**   [facility] is in the process of  updating its eGFR calculation to the   2021 CKD-EPI creatinine equation that estimates kidney function   without a race variable.    GFR, Estimated  Date Value Ref Range Status  02/06/2022 >60 >60 mL/min Final    Comment:    (NOTE) Calculated using the CKD-EPI Creatinine Equation (2021)    eGFR  Date Value Ref Range Status  12/17/2022 85 >59 mL/min/1.73 Final          Passed - Valid encounter within last 6 months    Recent Outpatient Visits           2 months ago Type II diabetes mellitus with complication (HCC)   [place] [facility] Medicine at [facility], Nyoka Cowden, MD   6 months ago Essential (primary) hypertension   [place] Primary Care & Sports Medicine at [facility], Nyoka Cowden, MD   10 months ago Essential (primary) hypertension   [place] Primary Care & Sports Medicine at [facility], Nyoka Cowden, MD   1 year ago Upper respiratory tract infection, unspecified type   [place] Primary Care & Sports Medicine at [facility], Nyoka Cowden, MD   1 year ago Type II diabetes mellitus with complication [facility])    Primary Care & Sports Medicine at [facility], Nyoka Cowden, MD       Future Appointments             In 2 months Judithann Graves Nyoka Cowden, MD [facility] Health Primary Care & Sports Medicine at [facility], [facility]            Passed - CBC within normal limits and completed in the last 12 months    WBC  Date Value Ref Range Status  12/17/2022 7.1 3.4 - 10.8 x10E3/uL Final  02/06/2022 6.1 4.0 - 10.5 K/uL Final   RBC  Date Value Ref Range Status  12/17/2022 3.79 3.77 - 5.28 x10E6/uL Final  02/06/2022 4.03 3.87 - 5.11 MIL/uL Final   Hemoglobin  Date Value Ref Range Status  12/17/2022 12.0 11.1 - 15.9 g/dL Final   Hematocrit  Date Value Ref Range Status  12/17/2022 34.8 34.0 - 46.6 % Final   MCHC  Date Value Ref Range Status  12/17/2022 34.5 31.5 - 35.7 g/dL Final  16/07/9603 54.0 30.0 - 36.0 g/dL Final   [facility]  Date Value Ref Range Status  12/17/2022 31.7 26.6 - 33.0 pg Final  02/06/2022 30.3 26.0 - 34.0 pg Final   MCV  Date Value Ref Range Status  12/17/2022 92 79 - 97 fL Final   No results found for: "PLTCOUNTKUC", "LABPLAT", "POCPLA" RDW  Date Value Ref Range Status  12/17/2022 12.2 11.7 - 15.4 % Final          atorvastatin (LIPITOR)  20 MG tablet 90 tablet 0    Sig: Take 1 tablet (20 mg total) by mouth daily.     Cardiovascular:  Antilipid - Statins Failed - 10/22/2023  2:49 PM      Failed - Lipid Panel in normal range within the last 12 months    Cholesterol, Total  Date Value Ref Range Status  12/17/2022 155 100 - 199 mg/dL Final   LDL Chol Calc (NIH)  Date Value Ref Range Status  12/17/2022 89 0 - 99 mg/dL Final   HDL  Date Value Ref Range Status  12/17/2022 49 >39 mg/dL Final   Triglycerides  Date Value Ref Range Status  12/17/2022 88 0 -  149 mg/dL Final         Passed - Patient is not pregnant      Passed - Valid encounter within last 12 months    Recent Outpatient Visits           2 months ago Type II diabetes mellitus with complication [facility])   [place] [facility] Sports Medicine at [facility], Nyoka Cowden, MD   6 months ago Essential (primary) hypertension   [place] Primary Care & Sports Medicine at [facility], Nyoka Cowden, MD   10 months ago Essential (primary) hypertension   [place] [facility] & Sports Medicine at [facility], Nyoka Cowden, MD   1 year ago Upper respiratory tract infection, unspecified type   [facility] Primary Care & Sports Medicine at [facility], Nyoka Cowden, MD   1 year ago Type II diabetes mellitus with complication [facility])   [place] Primary Care & Sports Medicine at [facility], Nyoka Cowden, MD       Future Appointments             In 2 months Reubin Milan, MD [facility] Primary Care & Sports Medicine at [facility], [facility]             esomeprazole (NEXIUM) 40 MG capsule 180 capsule 0    Sig: Take 1 capsule (40 mg total) by mouth 2 (two) times daily.     Gastroenterology: Proton Pump Inhibitors 2 Passed - 10/22/2023  2:49 PM      Passed - ALT in normal range and within 360 days    ALT  Date Value Ref Range Status  12/17/2022 19 0 - 32 IU/L Final         Passed - AST in normal  range and within 360 days    AST  Date Value Ref Range Status  12/17/2022 23 0 - 40 IU/L Final         Passed - Valid encounter within last 12 months    Recent Outpatient Visits           2 months ago Type II diabetes mellitus with complication [facility])   [place] [facility] Care & Sports Medicine at [facility], Nyoka Cowden, MD   6 months ago Essential (primary) hypertension   [place] [facility] Care & Sports Medicine at [facility], Nyoka Cowden, MD   10 months ago Essential (primary) hypertension   [place] Primary Care & Sports Medicine at [facility], Nyoka Cowden, MD   1 year ago Upper respiratory tract infection, unspecified type   [facility] Health Primary Care & Sports [facility] at [facility], Nyoka Cowden, MD   1 year ago Type II diabetes mellitus with complication [facility])    Primary Care & Sports Medicine at [facility], Nyoka Cowden, MD       Future Appointments             In 2 months Judithann Graves, Nyoka Cowden, MD [facility] Primary Care & Sports Medicine at [facility], [facility]

## 2023-10-22 NOTE — Telephone Encounter (Signed)
Medication Refill -  Most Recent Primary Care Visit:  Provider: Reubin Milan  Department: [facility]  Visit Type: OFFICE VISIT  Date: 08/21/2023  Medication:  spironolactone (ALDACTONE) 25 MG tablet  atorvastatin (LIPITOR) 20 MG tablet   metFORMIN (GLUCOPHAGE) 500 MG tablet   lisinopril (ZESTRIL) 20 MG tablet   esomeprazole (NEXIUM) 40 MG capsule    Has the patient contacted their pharmacy? Yes   Is this the correct pharmacy for this prescription? Yes [facility] - [place], [place] - [address] [place] [address] Phone: [phone] Fax: [phone]  This is the patient's preferred pharmacy:   Has the prescription been filled recently? Yes  Is the patient out of the medication? Yes  Has the patient been seen for an appointment in the last year OR does the patient have an upcoming appointment? Yes  Can we respond through MyChart? No  Agent: Please be advised that Rx refills may take up to 3 business days. We ask that you follow-up with your pharmacy.

## 2023-10-22 NOTE — Telephone Encounter (Signed)
Requested Prescriptions  Pending Prescriptions Disp Refills   spironolactone (ALDACTONE) 25 MG tablet 90 tablet 3    Sig: Take 1 tablet (25 mg total) by mouth daily.     Cardiovascular: Diuretics - Aldosterone Antagonist Failed - 10/22/2023  2:48 PM      Failed - Cr in normal range and within 180 days    Creatinine, Ser  Date Value Ref Range Status  12/17/2022 0.71 0.57 - 1.00 mg/dL Final         Failed - K in normal range and within 180 days    Potassium  Date Value Ref Range Status  12/17/2022 4.7 3.5 - 5.2 mmol/L Final         Failed - Na in normal range and within 180 days    Sodium  Date Value Ref Range Status  12/17/2022 140 134 - 144 mmol/L Final         Failed - eGFR is 30 or above and within 180 days    GFR calc Af Amer  Date Value Ref Range Status  08/28/2020 72 >59 mL/min/1.73 Final    Comment:    **In accordance with recommendations from the NKF-ASN Task force,**   [facility] is in the process of updating its eGFR calculation to the   2021 CKD-EPI creatinine equation that estimates kidney function   without a race variable.    GFR, Estimated  Date Value Ref Range Status  02/06/2022 >60 >60 mL/min Final    Comment:    (NOTE) Calculated using the CKD-EPI Creatinine Equation (2021)    eGFR  Date Value Ref Range Status  12/17/2022 85 >59 mL/min/1.73 Final         Passed - Last BP in normal range    BP Readings from Last 1 Encounters:  08/21/23 128/76         Passed - Valid encounter within last 6 months    Recent Outpatient Visits           2 months ago Type II diabetes mellitus with complication [facility])   [place] Primary Care & Sports Medicine at [facility], Nyoka Cowden, MD   6 months ago Essential (primary) hypertension   [place] Primary Care & Sports Medicine at [facility], Nyoka Cowden, MD   10 months ago Essential (primary) hypertension   [place] Primary Care & Sports Medicine at [facility], Nyoka Cowden,  MD   1 year ago Upper respiratory tract infection, unspecified type   [facility] Primary [facility] & Sports [facility] at [facility], Nyoka Cowden, MD   1 year ago Type II diabetes mellitus with complication [facility])   [place] Primary Care & Sports Medicine at [facility], Nyoka Cowden, MD       Future Appointments             In 2 months Reubin Milan, MD [facility] at [facility], [facility]             metFORMIN (GLUCOPHAGE) 500 MG tablet 90 tablet 2    Sig: Take 1 tablet (500 mg total) by mouth daily.     Endocrinology:  Diabetes - Biguanides Failed - 10/22/2023  2:48 PM      Failed - B12 Level in normal range and within 720 days    No results found for: "VITAMINB12"       Passed - Cr in normal range and within 360 days  Creatinine, Ser  Date Value Ref Range Status  12/17/2022 0.71 0.57 - 1.00 mg/dL Final         Passed - HBA1C is between 0 and 7.9 and within 180 days    Hemoglobin A1C  Date Value Ref Range Status  08/21/2023 5.8 (A) 4.0 - 5.6 % Final   Hgb A1c MFr Bld  Date Value Ref Range Status  12/17/2022 6.2 (H) 4.8 - 5.6 % Final    Comment:             Prediabetes: 5.7 - 6.4          Diabetes: >6.4          Glycemic control for adults with diabetes: <7.0          Passed - eGFR in normal range and within 360 days    GFR calc Af Amer  Date Value Ref Range Status  08/28/2020 72 >59 mL/min/1.73 Final    Comment:    **In accordance with recommendations from the NKF-ASN Task force,**   [facility] is in the process of updating its eGFR calculation to the   2021 CKD-EPI creatinine equation that estimates kidney function   without a race variable.    GFR, Estimated  Date Value Ref Range Status  02/06/2022 >60 >60 mL/min Final    Comment:    (NOTE) Calculated using the CKD-EPI Creatinine Equation (2021)    eGFR  Date Value Ref Range Status  12/17/2022 85 >59 mL/min/1.73 Final         Passed - Valid  encounter within last 6 months    Recent Outpatient Visits           2 months ago Type II diabetes mellitus with complication [facility])   [place] [facility] Care [facility] Sports Medicine at [facility], Nyoka Cowden, MD   6 months ago Essential (primary) hypertension   [place] Primary Care & Sports Medicine at [facility], Nyoka Cowden, MD   10 months ago Essential (primary) hypertension   [place] Primary Care & Sports Medicine at [facility], Nyoka Cowden, MD   1 year ago Upper respiratory tract infection, unspecified type   [facility] Health Primary Care & Sports Medicine at [facility], Nyoka Cowden, MD   1 year ago Type II diabetes mellitus with complication [facility])   [place] Primary Care & Sports Medicine at [facility], Nyoka Cowden, MD       Future Appointments             In 2 months Judithann Graves Nyoka Cowden, MD [facility] Health Primary Care & Sports Medicine at [facility], [facility]            Passed - CBC within normal limits and completed in the last 12 months    WBC  Date Value Ref Range Status  12/17/2022 7.1 3.4 - 10.8 x10E3/uL Final  02/06/2022 6.1 4.0 - 10.5 K/uL Final   RBC  Date Value Ref Range Status  12/17/2022 3.79 3.77 - 5.28 x10E6/uL Final  02/06/2022 4.03 3.87 - 5.11 MIL/uL Final   Hemoglobin  Date Value Ref Range Status  12/17/2022 12.0 11.1 - 15.9 g/dL Final   Hematocrit  Date Value Ref Range Status  12/17/2022 34.8 34.0 - 46.6 % Final   MCHC  Date Value Ref Range Status  12/17/2022 34.5 31.5 - 35.7 g/dL Final  09/81/1914 78.2 30.0 - 36.0 g/dL Final   [facility]  Date Value Ref Range Status  12/17/2022 31.7 26.6 - 33.0 pg Final  02/06/2022 30.3 26.0 - 34.0 pg Final   MCV  Date Value Ref Range Status  12/17/2022 92 79 - 97 fL Final   No results found for: "PLTCOUNTKUC", "LABPLAT", "POCPLA" RDW  Date Value Ref Range Status  12/17/2022 12.2 11.7 - 15.4 % Final          atorvastatin (LIPITOR) 20 MG tablet 90  tablet 2    Sig: Take 1 tablet (20 mg total) by mouth daily.     Cardiovascular:  Antilipid - Statins Failed - 10/22/2023  2:48 PM      Failed - Lipid Panel in normal range within the last 12 months    Cholesterol, Total  Date Value Ref Range Status  12/17/2022 155 100 - 199 mg/dL Final   LDL Chol Calc (NIH)  Date Value Ref Range Status  12/17/2022 89 0 - 99 mg/dL Final   HDL  Date Value Ref Range Status  12/17/2022 49 >39 mg/dL Final   Triglycerides  Date Value Ref Range Status  12/17/2022 88 0 - 149 mg/dL Final         Passed - Patient is not pregnant      Passed - Valid encounter within last 12 months    Recent Outpatient Visits           2 months ago Type II diabetes mellitus with complication [facility])   [place] [facility] Sports Medicine at [facility], Nyoka Cowden, MD   6 months ago Essential (primary) hypertension   [place] Primary Care & Sports Medicine at [facility], Nyoka Cowden, MD   10 months ago Essential (primary) hypertension   [place] Primary Care & Sports Medicine at [facility], Nyoka Cowden, MD   1 year ago Upper respiratory tract infection, unspecified type   [facility] Primary [facility] & Sports [facility] at [facility], Nyoka Cowden, MD   1 year ago Type II diabetes mellitus with complication [facility])   [place] Primary Care & Sports Medicine at [facility], Nyoka Cowden, MD       Future Appointments             In 2 months Reubin Milan, MD [facility] Health Primary Care & Sports Medicine at [facility], [facility]             esomeprazole (NEXIUM) 40 MG capsule 180 capsule 3    Sig: Take 1 capsule (40 mg total) by mouth 2 (two) times daily.     Gastroenterology: Proton Pump Inhibitors 2 Passed - 10/22/2023  2:48 PM      Passed - ALT in normal range and within 360 days    ALT  Date Value Ref Range Status  12/17/2022 19 0 - 32 IU/L Final         Passed - AST in normal range and  within 360 days    AST  Date Value Ref Range Status  12/17/2022 23 0 - 40 IU/L Final         Passed - Valid encounter within last 12 months    Recent Outpatient Visits           2 months ago Type II diabetes mellitus with complication [facility])   [place] Primary Care & Sports Medicine at [facility], Nyoka Cowden, MD   6 months ago Essential (primary) hypertension   [facility] Health Primary Care & Sports Medicine at [facility],  Nyoka Cowden, MD   10 months ago Essential (primary) hypertension   [place] Primary Care & Sports Medicine at [facility], Nyoka Cowden, MD   1 year ago Upper respiratory tract infection, unspecified type   [facility] Health Primary Care & Sports Medicine at [facility], Nyoka Cowden, MD   1 year ago Type II diabetes mellitus with complication [facility])   [place] Primary Care & Sports Medicine at [facility], Nyoka Cowden, MD       Future Appointments             In 2 months Reubin Milan, MD [facility] Health Primary Care & Sports Medicine at [facility], [facility]             lisinopril (ZESTRIL) 20 MG tablet 90 tablet 3    Sig: Take 1 tablet (20 mg total) by mouth daily.     Cardiovascular:  ACE Inhibitors Failed - 10/22/2023  2:48 PM      Failed - Cr in normal range and within 180 days    Creatinine, Ser  Date Value Ref Range Status  12/17/2022 0.71 0.57 - 1.00 mg/dL Final         Failed - K in normal range and within 180 days    Potassium  Date Value Ref Range Status  12/17/2022 4.7 3.5 - 5.2 mmol/L Final         Passed - Patient is not pregnant      Passed - Last BP in normal range    BP Readings from Last 1 Encounters:  08/21/23 128/76         Passed - Valid encounter within last 6 months    Recent Outpatient Visits           2 months ago Type II diabetes mellitus with complication [facility])   [place] Primary Care & Sports Medicine at [facility], Nyoka Cowden, MD   6 months ago  Essential (primary) hypertension   [place] Primary Care & Sports Medicine at [facility], Nyoka Cowden, MD   10 months ago Essential (primary) hypertension   [place] Primary Care & Sports Medicine at [facility], Nyoka Cowden, MD   1 year ago Upper respiratory tract infection, unspecified type   [facility] Primary [facility] & Sports Medicine at [facility], Nyoka Cowden, MD   1 year ago Type II diabetes mellitus with complication [facility])   [place] Primary Care & Sports Medicine at [facility], Nyoka Cowden, MD       Future Appointments             In 2 months Judithann Graves, Nyoka Cowden, MD [facility] Health Primary Care & Sports Medicine at [facility], [facility]

## 2023-10-31 ENCOUNTER — Other Ambulatory Visit: Payer: Self-pay | Admitting: Internal Medicine

## 2023-10-31 DIAGNOSIS — M810 Age-related osteoporosis without current pathological fracture: Secondary | ICD-10-CM

## 2023-10-31 NOTE — Telephone Encounter (Signed)
Rx 12/17/22 #12 3RF- too soon Requested Prescriptions  Pending Prescriptions Disp Refills   alendronate (FOSAMAX) 70 MG tablet [Pharmacy Med Name: ALENDRONATE SODIUM TABS 4'S 70MG ] 12 tablet 3    Sig: TAKE 1 TABLET EVERY 7 DAYS WITH A FULL GLASS OF WATER ON AN EMPTY STOMACH     Endocrinology:  Bisphosphonates Failed - 10/31/2023  2:46 PM      Failed - Vitamin D in normal range and within 360 days    Vit D, 25-Hydroxy  Date Value Ref Range Status  12/14/2021 61.0 30.0 - 100.0 ng/mL Final    Comment:    Vitamin D deficiency has been defined by the Institute of Medicine and an Endocrine Society practice guideline as a level of serum 25-OH vitamin D less than 20 ng/mL (1,2). The Endocrine Society went on to further define vitamin D insufficiency as a level between 21 and 29 ng/mL (2). 1. IOM (Institute of Medicine). 2010. Dietary reference    intakes for calcium and D. [place]: The    [employer]. 2. Holick MF, Binkley Clare, Bischoff-Ferrari HA, et al.    Evaluation, treatment, and prevention of vitamin D    deficiency: an Endocrine Society clinical practice    guideline. JCEM. 2011 Jul; 96(7):1911-30.          Failed - Mg Level in normal range and within 360 days    Magnesium  Date Value Ref Range Status  02/15/2019 1.5 (L) 1.6 - 2.3 mg/dL Final         Failed - Phosphate in normal range and within 360 days    Phosphorus  Date Value Ref Range Status  02/02/2019 2.5 2.5 - 4.6 mg/dL Final    Comment:    Performed at [facility], [address] [place], [place] [address]         Passed - Ca in normal range and within 360 days    Calcium  Date Value Ref Range Status  12/17/2022 9.8 8.7 - 10.3 mg/dL Final         Passed - Cr in normal range and within 360 days    Creatinine, Ser  Date Value Ref Range Status  12/17/2022 0.71 0.57 - 1.00 mg/dL Final         Passed - eGFR is 30 or above and within 360 days    GFR calc Af Amer  Date Value Ref  Range Status  08/28/2020 72 >59 mL/min/1.73 Final    Comment:    **In accordance with recommendations from the NKF-ASN Task force,**   [facility] is in the process of updating its eGFR calculation to the   2021 CKD-EPI creatinine equation that estimates kidney function   without a race variable.    GFR, Estimated  Date Value Ref Range Status  02/06/2022 >60 >60 mL/min Final    Comment:    (NOTE) Calculated using the CKD-EPI Creatinine Equation (2021)    eGFR  Date Value Ref Range Status  12/17/2022 85 >59 mL/min/1.73 Final         Passed - Valid encounter within last 12 months    Recent Outpatient Visits           2 months ago Type II diabetes mellitus with complication [facility])   [place] [facility] at [facility], Nyoka Cowden, MD   6 months ago Essential (primary) hypertension   [facility] at [facility], Nyoka Cowden, MD  10 months ago Essential (primary) hypertension   [place] [facility] at [facility], Nyoka Cowden, MD   1 year ago Upper respiratory tract infection, unspecified type   [facility] at [facility], Nyoka Cowden, MD   1 year ago Type II diabetes mellitus with complication [facility])   [place] [facility] at [facility], Nyoka Cowden, MD       Future Appointments             In 2 months Judithann Graves Nyoka Cowden, MD [facility] at [facility]            Passed - Bone Mineral Density or Dexa Scan completed in the last 2 years

## 2023-11-21 ENCOUNTER — Ambulatory Visit: Payer: Medicare Other | Admitting: Podiatry

## 2023-11-21 ENCOUNTER — Encounter: Payer: Self-pay | Admitting: Podiatry

## 2023-11-21 VITALS — Ht 61.0 in | Wt 132.0 lb

## 2023-11-21 DIAGNOSIS — E118 Type 2 diabetes mellitus with unspecified complications: Secondary | ICD-10-CM

## 2023-11-21 DIAGNOSIS — M79674 Pain in right toe(s): Secondary | ICD-10-CM | POA: Diagnosis not present

## 2023-11-21 DIAGNOSIS — M79675 Pain in left toe(s): Secondary | ICD-10-CM | POA: Diagnosis not present

## 2023-11-21 DIAGNOSIS — B351 Tinea unguium: Secondary | ICD-10-CM

## 2023-11-21 NOTE — Progress Notes (Signed)
  Subjective:  Patient ID: Lori Harrell, female    DOB: 11-Jul-1941,  MRN: [id number]  83 y.o. female presents to clinic with  preventative diabetic foot care and painful, elongated thickened toenails x 10 which are symptomatic when wearing enclosed shoe gear. This interferes with his/her daily activities.  Chief Complaint  Patient presents with   Nail Problem    Pt is here for [facility] last A1C was 5.8 PCP is Dr Judithann Graves and LOV was in January.   New problem(s): None   PCP is Reubin Milan, MD.  No Known Allergies  Review of Systems: Negative except as noted in the HPI.   Objective:  Lori Harrell is a pleasant 83 y.o. female WD, WN in NAD. AAO x 3.  Vascular Examination: Vascular status intact b/l with palpable pedal pulses. CFT immediate b/l. No edema. No pain with calf compression b/l. Skin temperature gradient WNL b/l. Pedal hair sparse.  Neurological Examination: Sensation grossly intact b/l with 10 gram monofilament. Vibratory sensation intact b/l.   Dermatological Examination: Pedal skin with normal turgor, texture and tone b/l. Toenails 1-5 b/l thick, discolored, elongated with subungual debris and pain on dorsal palpation. No hyperkeratotic lesions noted b/l.   Musculoskeletal Examination: Muscle strength 5/5 to b/l LE.  DJD dorsomedial midfoot with palpable exostosis. Utilizes cane for ambulation assistance.  Radiographs: None  Last A1c:      Latest Ref Rng & Units 08/21/2023   11:01 AM 04/21/2023    3:28 PM 12/17/2022   10:29 AM  Hemoglobin A1C  Hemoglobin-A1c 4.0 - 5.6 % 5.8  5.8  6.2      Assessment:   1. Pain due to onychomycosis of toenails of both feet   2. Type II diabetes mellitus with complication [facility])    Plan:  Patient was evaluated and treated. All patient's and/or POA's questions/concerns addressed on today's visit. Toenails 1-5 debrided in length and girth without incident. Continue foot and shoe inspections daily. Monitor blood  glucose per PCP/Endocrinologist's recommendations. Continue soft, supportive shoe gear daily. Report any pedal injuries to medical professional. Call office if there are any questions/concerns. -Patient/POA to call should there be question/concern in the interim.  Return in about 3 months (around 02/18/2024).  Freddie Breech, DPM      [place] LOCATION: [address] [place] [address]                   Office [phone]   [facility] LOCATION: [address] [place], [place] [address] Office [phone]

## 2023-11-25 DIAGNOSIS — D2271 Melanocytic nevi of right lower limb, including hip: Secondary | ICD-10-CM | POA: Diagnosis not present

## 2023-11-25 DIAGNOSIS — D2262 Melanocytic nevi of left upper limb, including shoulder: Secondary | ICD-10-CM | POA: Diagnosis not present

## 2023-11-25 DIAGNOSIS — Z85828 Personal history of other malignant neoplasm of skin: Secondary | ICD-10-CM | POA: Diagnosis not present

## 2023-11-25 DIAGNOSIS — D2261 Melanocytic nevi of right upper limb, including shoulder: Secondary | ICD-10-CM | POA: Diagnosis not present

## 2023-11-25 DIAGNOSIS — D2272 Melanocytic nevi of left lower limb, including hip: Secondary | ICD-10-CM | POA: Diagnosis not present

## 2023-12-31 ENCOUNTER — Encounter: Payer: Self-pay | Admitting: Internal Medicine

## 2023-12-31 ENCOUNTER — Ambulatory Visit (INDEPENDENT_AMBULATORY_CARE_PROVIDER_SITE_OTHER): Payer: Self-pay | Admitting: Internal Medicine

## 2023-12-31 VITALS — BP 134/76 | HR 59 | Ht 61.0 in | Wt 133.5 lb

## 2023-12-31 DIAGNOSIS — E785 Hyperlipidemia, unspecified: Secondary | ICD-10-CM

## 2023-12-31 DIAGNOSIS — Z7984 Long term (current) use of oral hypoglycemic drugs: Secondary | ICD-10-CM

## 2023-12-31 DIAGNOSIS — E1169 Type 2 diabetes mellitus with other specified complication: Secondary | ICD-10-CM

## 2023-12-31 DIAGNOSIS — I6523 Occlusion and stenosis of bilateral carotid arteries: Secondary | ICD-10-CM | POA: Diagnosis not present

## 2023-12-31 DIAGNOSIS — M810 Age-related osteoporosis without current pathological fracture: Secondary | ICD-10-CM | POA: Diagnosis not present

## 2023-12-31 DIAGNOSIS — I10 Essential (primary) hypertension: Secondary | ICD-10-CM | POA: Diagnosis not present

## 2023-12-31 DIAGNOSIS — K2270 Barrett's esophagus without dysplasia: Secondary | ICD-10-CM

## 2023-12-31 DIAGNOSIS — E118 Type 2 diabetes mellitus with unspecified complications: Secondary | ICD-10-CM | POA: Diagnosis not present

## 2023-12-31 DIAGNOSIS — R7989 Other specified abnormal findings of blood chemistry: Secondary | ICD-10-CM | POA: Diagnosis not present

## 2023-12-31 MED ORDER — ALENDRONATE SODIUM 70 MG PO TABS: 70.0000 mg | ORAL_TABLET | ORAL | 3 refills | Status: DC

## 2023-12-31 NOTE — Assessment & Plan Note (Addendum)
 Doing well on Nexium.  No new symptoms but due for repeat EGD Will refer to [facility]

## 2023-12-31 NOTE — Assessment & Plan Note (Addendum)
 On Fosamax without side effects. Last DEXA 2023 no change

## 2023-12-31 NOTE — Assessment & Plan Note (Signed)
 Blood sugars have been stable.  No recent hypoglycemic events requiring assistance. Currently medications are MTF. Lab Results  Component Value Date   HGBA1C 5.8 (A) 08/21/2023   Last visit no changes were made.

## 2023-12-31 NOTE — Progress Notes (Signed)
 Date:  12/31/2023   Name:  Lori Harrell   DOB:  Mar 10, 1941   MRN:  [id number]   Chief Complaint: Annual Exam Lori Harrell is a 83 y.o. female who presents today for her Complete Annual Exam. She feels fairly well. She reports exercising 3 days a week, walks 30 minutes at local recreation center. She reports she is sleeping fairly well. Breast complaints none.  Health Maintenance  Topic Date Due   DTaP/Tdap/Td vaccine (2 - Td or Tdap) 10/01/2020   Yearly kidney function blood test for diabetes  12/17/2023   Yearly kidney health urinalysis for diabetes  12/17/2023   COVID-19 Vaccine (4 - 2024-25 season) 01/16/2024*   Zoster (Shingles) Vaccine (1 of 2) 03/31/2024*   Hemoglobin A1C  02/18/2024   Flu Shot  04/30/2024   Eye exam for diabetics  07/13/2024   Medicare Annual Wellness Visit  07/22/2024   Complete foot exam   12/30/2024   Pneumonia Vaccine  Completed   DEXA scan (bone density measurement)  Completed   HPV Vaccine  Aged Out   Hepatitis C Screening  Discontinued  *Topic was postponed. The date shown is not the original due date.    Hypertension This is a chronic problem. The problem is controlled. Pertinent negatives include no chest pain, headaches, palpitations or shortness of breath. Past treatments include ACE inhibitors and diuretics. There are no compliance problems.  There is no history of kidney disease.  Diabetes She presents for her follow-up diabetic visit. She has type 2 diabetes mellitus. Her disease course has been stable. Pertinent negatives for hypoglycemia include no dizziness, headaches or tremors. Pertinent negatives for diabetes include no chest pain, no fatigue, no polydipsia, no polyuria and no weakness. Current diabetic treatments: MTF.  Hyperlipidemia This is a chronic problem. Pertinent negatives include no chest pain, myalgias or shortness of breath. Current antihyperlipidemic treatment includes statins.  Gastroesophageal Reflux She  complains of heartburn. She reports no abdominal pain, no chest pain, no coughing or no wheezing. This is a recurrent problem. The problem occurs rarely. Pertinent negatives include no fatigue. Risk factors include Barrett's esophagus. She has tried a PPI for the symptoms. The treatment provided significant relief. Past procedures include an EGD.    Review of Systems  Constitutional:  Negative for appetite change, fatigue, fever and unexpected weight change.  HENT:  Negative for tinnitus and trouble swallowing.   Eyes:  Negative for visual disturbance.  Respiratory:  Negative for cough, chest tightness, shortness of breath and wheezing.   Cardiovascular:  Negative for chest pain, palpitations and leg swelling.  Gastrointestinal:  Positive for heartburn. Negative for abdominal pain, constipation and diarrhea.  Endocrine: Negative for polydipsia and polyuria.  Genitourinary:  Negative for dysuria and hematuria.  Musculoskeletal:  Negative for arthralgias and myalgias.  Neurological:  Negative for dizziness, tremors, weakness, light-headedness, numbness and headaches.  Psychiatric/Behavioral:  Negative for dysphoric mood.      Lab Results  Component Value Date   NA 140 12/17/2022   K 4.7 12/17/2022   CO2 20 12/17/2022   GLUCOSE 102 (H) 12/17/2022   BUN 15 12/17/2022   CREATININE 0.71 12/17/2022   CALCIUM 9.8 12/17/2022   EGFR 85 12/17/2022   GFRNONAA >60 02/06/2022   Lab Results  Component Value Date   CHOL 155 12/17/2022   HDL 49 12/17/2022   LDLCALC 89 12/17/2022   TRIG 88 12/17/2022   CHOLHDL 3.2 12/17/2022   Lab Results  Component Value Date  TSH 5.620 (H) 12/17/2022   Lab Results  Component Value Date   HGBA1C 5.8 (A) 08/21/2023   Lab Results  Component Value Date   WBC 7.1 12/17/2022   HGB 12.0 12/17/2022   HCT 34.8 12/17/2022   MCV 92 12/17/2022   PLT 222 12/17/2022   Lab Results  Component Value Date   ALT 19 12/17/2022   AST 23 12/17/2022   ALKPHOS 83  12/17/2022   BILITOT 0.3 12/17/2022   Lab Results  Component Value Date   VD25OH 61.0 12/14/2021     Patient Active Problem List   Diagnosis Date Noted   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/27/2022   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   Basal cell carcinoma (BCC) of skin of lip 05/31/2021   Elevated TSH 12/15/2020   S/P lumbar fusion 05/19/2019   Red blood cell antibody positive 02/03/2019   DDD (degenerative disc disease), lumbar 01/07/2019   Carotid artery stenosis, asymptomatic, bilateral 07/07/2017   Primary open angle glaucoma 05/08/2017   Old partial retinal detachment 05/08/2017   Large hiatal hernia 07/04/2016   Sigmoid diverticulitis 07/04/2016   Stress incontinence 07/04/2016   Type II diabetes mellitus with complication (HCC) 10/31/2015   Constipation 10/31/2015   Barrett's esophagus without dysplasia 06/16/2015   Essential (primary) hypertension 06/16/2015   Hyperlipidemia associated with type 2 diabetes mellitus (HCC) 06/16/2015   Age-related osteoporosis without current pathological fracture 06/16/2015   Hypercalcemia 04/22/2012    No Known Allergies  Past Surgical History:  Procedure Laterality Date   ANTERIOR LATERAL LUMBAR FUSION WITH PERCUTANEOUS SCREW 2 LEVEL N/A 05/19/2019   Procedure: L2-4 LATERAL LUMBAR INTERBODY FUSION (XLIF);  Surgeon: Venetia Night, MD;  Location: [facility] ORS;  Service: Neurosurgery;  Laterality: N/A;   CATARACT EXTRACTION Bilateral    CHOLECYSTECTOMY, LAPAROSCOPIC  02/05/2019   [facility]   COLONOSCOPY  2007   sigmoid diverticulosis   ENDOSCOPIC RETROGRADE CHOLANGIOPANCREATOGRAPHY (ERCP) WITH PROPOFOL N/A 02/02/2019   Procedure: ENDOSCOPIC RETROGRADE CHOLANGIOPANCREATOGRAPHY (ERCP) WITH PROPOFOL;  Surgeon: Midge Minium, MD;  Location: [facility] ENDOSCOPY;  Service: Endoscopy;  Laterality: N/A;   ESOPHAGOGASTRODUODENOSCOPY  04/22/2016   ESOPHAGOGASTRODUODENOSCOPY  04/26/2019    ESOPHAGOGASTRODUODENOSCOPY  09/15/2019   REVERSE SHOULDER ARTHROPLASTY Left 02/18/2022   Procedure: Left reverse shoulder arthroplasty, biceps tenodesis;  Surgeon: Signa Kell, MD;  Location: [facility] ORS;  Service: Orthopedics;  Laterality: Left;   TONSILLECTOMY     UPPER GASTROINTESTINAL ENDOSCOPY  02/2013   Barrett's - done at [facility]   VAGINAL HYSTERECTOMY      Social History   Tobacco Use   Smoking status: Never   Smokeless tobacco: Never   Tobacco comments:    smoking cessation materials not required  Vaping Use   Vaping status: Never Used  Substance Use Topics   Alcohol use: No    Alcohol/week: 0.0 standard drinks of alcohol   Drug use: No     Medication list has been reviewed and updated.  Current Meds  Medication Sig   acetaminophen (TYLENOL) 500 MG tablet Take 1,000 mg by mouth 2 (two) times daily.   alendronate (FOSAMAX) 70 MG tablet Take 1 tablet (70 mg total) by mouth every 7 (seven) days. Take with a full glass of water on an empty stomach.   atorvastatin (LIPITOR) 20 MG tablet Take 1 tablet (20 mg total) by mouth daily.   dorzolamide-timolol (COSOPT) 22.3-6.8 MG/ML ophthalmic solution Place 1 drop into both eyes 2 (two) times daily.  esomeprazole (NEXIUM) 40 MG capsule Take 1 capsule (40 mg total) by mouth 2 (two) times daily.   FREESTYLE LITE test strip USE TO TEST BLOOD SUGAR DAILY   Lancets (FREESTYLE) lancets USE 1 LANCET DAILY   latanoprost (XALATAN) 0.005 % ophthalmic solution Place 1 drop into both eyes at bedtime.   lisinopril (ZESTRIL) 20 MG tablet Take 1 tablet (20 mg total) by mouth daily.   Menthol, Topical Analgesic, (BIOFREEZE) 10 % LIQD Apply 1 application. topically 3 (three) times daily as needed (pain.).   metFORMIN (GLUCOPHAGE) 500 MG tablet Take 1 tablet (500 mg total) by mouth daily.   Multiple Vitamin (MULTIVITAMIN WITH MINERALS) TABS tablet Take 1 tablet by mouth in the morning. Women's Centrum Silver   Multiple Vitamins-Minerals  (PRESERVISION AREDS 2 PO) Take by mouth 2 (two) times daily.   nystatin (MYCOSTATIN) 100000 UNIT/ML suspension 1 tsp - swish, gargle and spit 4 times a day   polycarbophil (FIBERCON) 625 MG tablet Take 625 mg by mouth daily.   spironolactone (ALDACTONE) 25 MG tablet Take 1 tablet (25 mg total) by mouth daily.       12/31/2023    9:11 AM 04/21/2023    3:18 PM 12/17/2022    9:58 AM 10/04/2022   10:22 AM  GAD 7 : Generalized Anxiety Score  Nervous, Anxious, on Edge 0 0 0 0  Control/stop worrying 0 0 0 0  Worry too much - different things 0 0 0 0  Trouble relaxing 0 0 0 0  Restless 0 0 0 0  Easily annoyed or irritable 0 0 0 0  Afraid - awful might happen 0 0 0 0  Total GAD 7 Score 0 0 0 0  Anxiety Difficulty Not difficult at all Not difficult at all Not difficult at all Not difficult at all       12/31/2023    9:11 AM 07/23/2023   11:01 AM 04/21/2023    3:18 PM  Depression screen PHQ 2/9  Decreased Interest 0 0 0  Down, Depressed, Hopeless 0 0 0  PHQ - 2 Score 0 0 0  Altered sleeping 2 0 1  Tired, decreased energy 1 0 1  Change in appetite 0 0 0  Feeling bad or failure about yourself  0 0 0  Trouble concentrating 0 0 0  Moving slowly or fidgety/restless 0 0 0  Suicidal thoughts 0 0 0  PHQ-9 Score 3 0 2  Difficult doing work/chores Not difficult at all Not difficult at all Not difficult at all    BP Readings from Last 3 Encounters:  12/31/23 134/76  08/21/23 128/76  04/21/23 120/68    Physical Exam Vitals and nursing note reviewed.  Constitutional:      General: She is not in acute distress.    Appearance: She is well-developed.  HENT:     Head: Normocephalic and atraumatic.     Right Ear: Tympanic membrane and ear canal normal.     Left Ear: Tympanic membrane and ear canal normal.     Nose:     Right Sinus: No maxillary sinus tenderness.     Left Sinus: No maxillary sinus tenderness.  Eyes:     General: No scleral icterus.       Right eye: No discharge.        Left  eye: No discharge.     Conjunctiva/sclera: Conjunctivae normal.  Neck:     Thyroid: No thyromegaly.     Vascular: No carotid bruit.  Cardiovascular:  Rate and Rhythm: Normal rate and regular rhythm.     Pulses: Normal pulses.     Heart sounds: Normal heart sounds.  Pulmonary:     Effort: Pulmonary effort is normal. No respiratory distress.     Breath sounds: No wheezing.  Chest:  Breasts:    Right: No mass, nipple discharge, skin change or tenderness.     Left: No mass, nipple discharge, skin change or tenderness.  Abdominal:     General: Bowel sounds are normal.     Palpations: Abdomen is soft.     Tenderness: There is no abdominal tenderness.  Musculoskeletal:     Cervical back: Normal range of motion. No erythema.     Right lower leg: No edema.     Left lower leg: No edema.  Lymphadenopathy:     Cervical: No cervical adenopathy.  Skin:    General: Skin is warm and dry.     Findings: No rash.  Neurological:     Mental Status: She is alert and oriented to person, place, and time.     Cranial Nerves: No cranial nerve deficit.     Sensory: No sensory deficit.     Deep Tendon Reflexes: Reflexes are normal and symmetric.  Psychiatric:        Attention and Perception: Attention normal.        Mood and Affect: Mood normal.    Diabetic Foot Exam - Simple   Simple Foot Form Diabetic Foot exam was performed with the following findings: Yes 12/31/2023  9:34 AM  Visual Inspection No deformities, no ulcerations, no other skin breakdown bilaterally: Yes Sensation Testing Intact to touch and monofilament testing bilaterally: Yes Pulse Check Posterior Tibialis and Dorsalis pulse intact bilaterally: Yes Comments Toes dusky and cool but DP 2+ bilaterally      Wt Readings from Last 3 Encounters:  12/31/23 133 lb 8 oz (60.6 kg)  11/21/23 132 lb (59.9 kg)  08/22/23 132 lb (59.9 kg)    BP 134/76   Pulse (!) 59   Ht 5\' 1"  (1.549 m)   Wt 133 lb 8 oz (60.6 kg)   SpO2 100%    BMI 25.22 kg/m   Assessment and Plan:  Problem List Items Addressed This Visit       Unprioritized   Barrett's esophagus without dysplasia (Chronic)   Doing well on Nexium.  No new symptoms but due for repeat EGD Will refer to [facility]      Relevant Orders   CBC with Differential/Platelet   Ambulatory referral to Gastroenterology   Essential (primary) hypertension - Primary (Chronic)   Blood pressure is well controlled.  Current medications lisinopril and spironolactone. Will continue same regimen along with efforts to limit dietary sodium.       Relevant Orders   CBC with Differential/Platelet   Hyperlipidemia associated with type 2 diabetes mellitus (HCC) (Chronic)   LDL is  Lab Results  Component Value Date   LDLCALC 89 12/17/2022   Current regimen is atorvastatin 20 mg.  No medication side effects noted. Goal LDL is <70.       Relevant Orders   Lipid panel   Age-related osteoporosis without current pathological fracture   On Fosamax without side effects. Last DEXA 2023 no change      Relevant Medications   alendronate (FOSAMAX) 70 MG tablet   Other Relevant Orders   VITAMIN D 25 Hydroxy (Vit-D Deficiency, Fractures)   Type II diabetes mellitus with complication (HCC) (Chronic)  Blood sugars have been stable.  No recent hypoglycemic events requiring assistance. Currently medications are MTF. Lab Results  Component Value Date   HGBA1C 5.8 (A) 08/21/2023   Last visit no changes were made.       Relevant Orders   Comprehensive metabolic panel with GFR   Hemoglobin A1c   Microalbumin / creatinine urine ratio   Carotid artery stenosis, asymptomatic, bilateral   On statin daily. Last [place] in 2020 showed mild bilateral stenosis       Elevated TSH   Continue to monitor for change.      Relevant Orders   TSH + free T4   Other Visit Diagnoses       Long term current use of oral hypoglycemic drug           Return in about 4 months (around  05/01/2024) for DM, HTN.    Reubin Milan, MD [facility] [place]

## 2023-12-31 NOTE — Assessment & Plan Note (Signed)
Continue to monitor for change.

## 2023-12-31 NOTE — Assessment & Plan Note (Signed)
 LDL is  Lab Results  Component Value Date   LDLCALC 89 12/17/2022   Current regimen is atorvastatin 20 mg.  No medication side effects noted. Goal LDL is <70.

## 2023-12-31 NOTE — Assessment & Plan Note (Signed)
 On statin daily. Last [place] in 2020 showed mild bilateral stenosis

## 2023-12-31 NOTE — Assessment & Plan Note (Signed)
 Blood pressure is well controlled.  Current medications lisinopril and spironolactone. Will continue same regimen along with efforts to limit dietary sodium.

## 2024-01-01 LAB — HEMOGLOBIN A1C
Est. average glucose Bld gHb Est-mCnc: 134 mg/dL
Hgb A1c MFr Bld: 6.3 % — ABNORMAL HIGH (ref 4.8–5.6)

## 2024-01-01 LAB — LIPID PANEL
Chol/HDL Ratio: 3.6 ratio (ref 0.0–4.4)
Cholesterol, Total: 152 mg/dL (ref 100–199)
HDL: 42 mg/dL (ref >39)
LDL Chol Calc (NIH): 88 mg/dL (ref 0–99)
Triglycerides: 122 mg/dL (ref 0–149)
VLDL Cholesterol Cal: 22 mg/dL (ref 5–40)

## 2024-01-01 LAB — MICROALBUMIN / CREATININE URINE RATIO
Creatinine, Urine: 62.4 mg/dL
Microalb/Creat Ratio: 13 mg/g{creat} (ref 0–29)
Microalbumin, Urine: 8 ug/mL

## 2024-01-01 LAB — COMPREHENSIVE METABOLIC PANEL WITH GFR
ALT: 24 IU/L (ref 0–32)
AST: 24 IU/L (ref 0–40)
Albumin: 4.7 g/dL (ref 3.7–4.7)
Alkaline Phosphatase: 55 IU/L (ref 44–121)
BUN/Creatinine Ratio: 20 (ref 12–28)
BUN: 17 mg/dL (ref 8–27)
Bilirubin Total: 0.4 mg/dL (ref 0.0–1.2)
CO2: 21 mmol/L (ref 20–29)
Calcium: 10 mg/dL (ref 8.7–10.3)
Chloride: 102 mmol/L (ref 96–106)
Creatinine, Ser: 0.84 mg/dL (ref 0.57–1.00)
Globulin, Total: 2.1 g/dL (ref 1.5–4.5)
Glucose: 110 mg/dL — ABNORMAL HIGH (ref 70–99)
Potassium: 4.9 mmol/L (ref 3.5–5.2)
Sodium: 139 mmol/L (ref 134–144)
Total Protein: 6.8 g/dL (ref 6.0–8.5)
eGFR: 69 mL/min/{1.73_m2} (ref >59)

## 2024-01-01 LAB — CBC WITH DIFFERENTIAL/PLATELET
Basophils Absolute: 0.1 10*3/uL (ref 0.0–0.2)
Basos: 1 %
EOS (ABSOLUTE): 0.1 10*3/uL (ref 0.0–0.4)
Eos: 2 %
Hematocrit: 38 % (ref 34.0–46.6)
Hemoglobin: 12.7 g/dL (ref 11.1–15.9)
Immature Grans (Abs): 0.1 10*3/uL (ref 0.0–0.1)
Immature Granulocytes: 1 %
Lymphocytes Absolute: 1.4 10*3/uL (ref 0.7–3.1)
Lymphs: 18 %
MCH: 32.8 pg (ref 26.6–33.0)
MCHC: 33.4 g/dL (ref 31.5–35.7)
MCV: 98 fL — ABNORMAL HIGH (ref 79–97)
Monocytes Absolute: 0.5 10*3/uL (ref 0.1–0.9)
Monocytes: 6 %
Neutrophils Absolute: 5.4 10*3/uL (ref 1.4–7.0)
Neutrophils: 72 %
Platelets: 242 10*3/uL (ref 150–450)
RBC: 3.87 x10E6/uL (ref 3.77–5.28)
RDW: 11.9 % (ref 11.7–15.4)
WBC: 7.5 10*3/uL (ref 3.4–10.8)

## 2024-01-01 LAB — TSH+FREE T4
Free T4: 1.09 ng/dL (ref 0.82–1.77)
TSH: 7.93 u[IU]/mL — ABNORMAL HIGH (ref 0.450–4.500)

## 2024-01-01 LAB — VITAMIN D 25 HYDROXY (VIT D DEFICIENCY, FRACTURES): Vit D, 25-Hydroxy: 39.1 ng/mL (ref 30.0–100.0)

## 2024-01-02 ENCOUNTER — Encounter: Payer: Self-pay | Admitting: Internal Medicine

## 2024-01-05 ENCOUNTER — Telehealth: Payer: Self-pay

## 2024-01-05 NOTE — Telephone Encounter (Signed)
 Noted  KP  Copied from CRM [id number]. Topic: Clinical - Lab/Test Results >> Jan 05, 2024 10:45 AM Alessandra Bevels wrote: Reason for CRM: Patient called for lab results. Agent read Results. Patient expressed understanding. No questions at this time.

## 2024-01-07 ENCOUNTER — Other Ambulatory Visit: Payer: Self-pay | Admitting: Internal Medicine

## 2024-01-07 DIAGNOSIS — E785 Hyperlipidemia, unspecified: Secondary | ICD-10-CM

## 2024-01-07 DIAGNOSIS — E1169 Type 2 diabetes mellitus with other specified complication: Secondary | ICD-10-CM

## 2024-01-07 DIAGNOSIS — K2270 Barrett's esophagus without dysplasia: Secondary | ICD-10-CM

## 2024-01-08 NOTE — Telephone Encounter (Signed)
 Requested Prescriptions  Pending Prescriptions Disp Refills   esomeprazole (NEXIUM) 40 MG capsule [Pharmacy Med Name: ESOMEPRAZOLE MAGNESIUM DR CAPS 40MG ] 180 capsule 1    Sig: TAKE 1 CAPSULE TWICE A DAY     Gastroenterology: Proton Pump Inhibitors 2 Passed - 01/08/2024 12:52 PM      Passed - ALT in normal range and within 360 days    ALT  Date Value Ref Range Status  12/31/2023 24 0 - 32 IU/L Final         Passed - AST in normal range and within 360 days    AST  Date Value Ref Range Status  12/31/2023 24 0 - 40 IU/L Final         Passed - Valid encounter within last 12 months    Recent Outpatient Visits           1 week ago Essential (primary) hypertension   [place] [facility] at [facility], Nyoka Cowden, MD               atorvastatin (LIPITOR) 20 MG tablet [Pharmacy Med Name: ATORVASTATIN TABS 20MG ] 90 tablet 1    Sig: TAKE 1 TABLET DAILY     Cardiovascular:  Antilipid - Statins Failed - 01/08/2024 12:52 PM      Failed - Lipid Panel in normal range within the last 12 months    Cholesterol, Total  Date Value Ref Range Status  12/31/2023 152 100 - 199 mg/dL Final   LDL Chol Calc (NIH)  Date Value Ref Range Status  12/31/2023 88 0 - 99 mg/dL Final   HDL  Date Value Ref Range Status  12/31/2023 42 >39 mg/dL Final   Triglycerides  Date Value Ref Range Status  12/31/2023 122 0 - 149 mg/dL Final         Passed - Patient is not pregnant      Passed - Valid encounter within last 12 months    Recent Outpatient Visits           1 week ago Essential (primary) hypertension   [place] [facility] at [facility], Nyoka Cowden, MD

## 2024-01-12 DIAGNOSIS — H401113 Primary open-angle glaucoma, right eye, severe stage: Secondary | ICD-10-CM | POA: Diagnosis not present

## 2024-01-12 DIAGNOSIS — H401121 Primary open-angle glaucoma, left eye, mild stage: Secondary | ICD-10-CM | POA: Diagnosis not present

## 2024-01-12 LAB — HM DIABETES EYE EXAM

## 2024-02-20 ENCOUNTER — Ambulatory Visit (INDEPENDENT_AMBULATORY_CARE_PROVIDER_SITE_OTHER): Payer: Medicare Other | Admitting: Podiatry

## 2024-02-20 DIAGNOSIS — M79675 Pain in left toe(s): Secondary | ICD-10-CM | POA: Diagnosis not present

## 2024-02-20 DIAGNOSIS — B351 Tinea unguium: Secondary | ICD-10-CM | POA: Diagnosis not present

## 2024-02-20 DIAGNOSIS — M79674 Pain in right toe(s): Secondary | ICD-10-CM

## 2024-02-20 DIAGNOSIS — E118 Type 2 diabetes mellitus with unspecified complications: Secondary | ICD-10-CM | POA: Diagnosis not present

## 2024-02-20 NOTE — Progress Notes (Unsigned)
  Subjective:  Patient ID: Lori Harrell, female    DOB: [birth year],  MRN: [id number]  83 y.o. female presents to clinic with  preventative diabetic foot care and painful elongated mycotic toenails 1-5 bilaterally which are tender when wearing enclosed shoe gear. Pain is relieved with periodic professional debridement. No chief complaint on file.   New problem(s): None   PCP is Sheron Dixons, MD.  No Known Allergies  Review of Systems: Negative except as noted in the HPI.   Objective:  Lori Harrell is a pleasant 83 y.o. female WD, WN in NAD. AAO x 3.  Vascular Examination: Vascular status intact b/l with palpable pedal pulses. CFT immediate b/l. No edema. No pain with calf compression b/l. Skin temperature gradient WNL b/l. Pedal hair sparse. No cyanosis or clubbing noted b/l LE.  Neurological Examination: Sensation grossly intact b/l with 10 gram monofilament. Vibratory sensation intact b/l.   Dermatological Examination: Pedal skin with normal turgor, texture and tone b/l. Toenails 1-5 b/l thick, discolored, elongated with subungual debris and pain on dorsal palpation. No hyperkeratotic lesions noted b/l.   Musculoskeletal Examination: Muscle strength 5/5 to b/l LE. Palpable exostosis noted dorsomedial midfoot joint of b/l feet.  Radiographs: None  Last A1c:      Latest Ref Rng & Units 12/31/2023    9:49 AM 08/21/2023   11:01 AM 04/21/2023    3:28 PM  Hemoglobin A1C  Hemoglobin-A1c 4.8 - 5.6 % 6.3  5.8  5.8      Assessment:   1. Pain due to onychomycosis of toenails of both feet   2. Type II diabetes mellitus with complication [facility])    Plan:  Consent given for treatment. Patient examined. All patient's and/or POA's questions/concerns addressed on today's visit. Toenails 1-5 debrided in length and girth without incident. Continue foot and shoe inspections daily. Monitor blood glucose per PCP/Endocrinologist's recommendations. Continue soft, supportive  shoe gear daily. Report any pedal injuries to medical professional. Call office if there are any questions/concerns. -Patient/POA to call should there be question/concern in the interim.  Return in about 3 months (around 05/22/2024).  Luella Sager, DPM      [place] LOCATION: [address], [place] [address]                   Office [phone]   [facility] LOCATION: [address] [place], [place] [address] Office [phone]

## 2024-02-24 ENCOUNTER — Encounter: Payer: Self-pay | Admitting: Podiatry

## 2024-03-29 DIAGNOSIS — K2270 Barrett's esophagus without dysplasia: Secondary | ICD-10-CM | POA: Diagnosis not present

## 2024-03-30 DIAGNOSIS — E113211 Type 2 diabetes mellitus with mild nonproliferative diabetic retinopathy with macular edema, right eye: Secondary | ICD-10-CM | POA: Diagnosis not present

## 2024-03-30 DIAGNOSIS — H26493 Other secondary cataract, bilateral: Secondary | ICD-10-CM | POA: Diagnosis not present

## 2024-03-30 DIAGNOSIS — E119 Type 2 diabetes mellitus without complications: Secondary | ICD-10-CM | POA: Diagnosis not present

## 2024-03-30 DIAGNOSIS — H35342 Macular cyst, hole, or pseudohole, left eye: Secondary | ICD-10-CM | POA: Diagnosis not present

## 2024-03-30 DIAGNOSIS — H401134 Primary open-angle glaucoma, bilateral, indeterminate stage: Secondary | ICD-10-CM | POA: Diagnosis not present

## 2024-03-30 DIAGNOSIS — H3581 Retinal edema: Secondary | ICD-10-CM | POA: Diagnosis not present

## 2024-05-21 ENCOUNTER — Ambulatory Visit (INDEPENDENT_AMBULATORY_CARE_PROVIDER_SITE_OTHER): Admitting: Podiatry

## 2024-05-21 ENCOUNTER — Encounter: Payer: Self-pay | Admitting: Podiatry

## 2024-05-21 DIAGNOSIS — M79674 Pain in right toe(s): Secondary | ICD-10-CM | POA: Diagnosis not present

## 2024-05-21 DIAGNOSIS — M79675 Pain in left toe(s): Secondary | ICD-10-CM

## 2024-05-21 DIAGNOSIS — B351 Tinea unguium: Secondary | ICD-10-CM | POA: Diagnosis not present

## 2024-05-21 DIAGNOSIS — E118 Type 2 diabetes mellitus with unspecified complications: Secondary | ICD-10-CM

## 2024-05-25 NOTE — Progress Notes (Signed)
  Subjective:  Patient ID: Lori Harrell, female    DOB: 1941-09-22,  MRN: [id number]  Lori Harrell presents to clinic today for preventative diabetic foot care for painful thick toenails that are difficult to trim. Pain interferes with ambulation. Aggravating factors include wearing enclosed shoe gear. Pain is relieved with periodic professional debridement.  Chief Complaint  Patient presents with   [facility]    Rm1 Diabetic foot care/ Dr. Leita Adie last visit April 2, 20025 /A1c 6.3/blood sugar 101   New problem(s): None.   PCP is Adie Leita DEL, MD.  No Known Allergies  Review of Systems: Negative except as noted in the HPI.  Objective: No changes noted in today's physical examination. There were no vitals filed for this visit. Lori Harrell is a pleasant 83 y.o. female WD, WN in NAD. AAO x 3.  Vascular Examination: Vascular status intact b/l with palpable pedal pulses. CFT immediate b/l. No edema. No pain with calf compression b/l. Skin temperature gradient WNL b/l. Pedal hair sparse. No cyanosis or clubbing noted b/l LE.  Neurological Examination: Sensation grossly intact b/l with 10 gram monofilament. Vibratory sensation intact b/l.   Dermatological Examination: Pedal skin with normal turgor, texture and tone b/l. Toenails 1-5 b/l thick, discolored, elongated with subungual debris and pain on dorsal palpation. No hyperkeratotic lesions noted b/l.   Musculoskeletal Examination: Muscle strength 5/5 to b/l LE. Palpable exostosis noted dorsomedial midfoot joint of b/l feet.  Radiographs: None  Assessment/Plan: 1. Pain due to onychomycosis of toenails of both feet   2. Type II diabetes mellitus with complication [facility])   Patient was evaluated and treated. All patient's and/or POA's questions/concerns addressed on today's visit. Mycotic toenails 1-5 debrided in length and girth without incident.  Continue daily foot inspections and monitor blood glucose  per PCP/Endocrinologist's recommendations.Continue soft, supportive shoe gear daily. Report any pedal injuries to medical professional. Call office if there are any quesitons/concerns.  Return in about 3 months (around 08/21/2024).  Delon LITTIE Merlin, DPM      [place] LOCATION: [address] [place] [address]                   Office [phone]   [facility] LOCATION: [address] [place], [place] [address] Office [phone]

## 2024-05-31 ENCOUNTER — Other Ambulatory Visit: Payer: Self-pay | Admitting: Internal Medicine

## 2024-05-31 DIAGNOSIS — E118 Type 2 diabetes mellitus with unspecified complications: Secondary | ICD-10-CM

## 2024-06-01 ENCOUNTER — Encounter: Payer: Self-pay | Admitting: Internal Medicine

## 2024-06-01 ENCOUNTER — Ambulatory Visit (INDEPENDENT_AMBULATORY_CARE_PROVIDER_SITE_OTHER): Admitting: Internal Medicine

## 2024-06-01 VITALS — BP 112/74 | HR 70 | Ht 61.0 in | Wt 131.0 lb

## 2024-06-01 DIAGNOSIS — Z23 Encounter for immunization: Secondary | ICD-10-CM

## 2024-06-01 DIAGNOSIS — Z7984 Long term (current) use of oral hypoglycemic drugs: Secondary | ICD-10-CM

## 2024-06-01 DIAGNOSIS — I10 Essential (primary) hypertension: Secondary | ICD-10-CM

## 2024-06-01 DIAGNOSIS — E118 Type 2 diabetes mellitus with unspecified complications: Secondary | ICD-10-CM

## 2024-06-01 LAB — POCT GLYCOSYLATED HEMOGLOBIN (HGB A1C): Hemoglobin A1C: 5.7 % — AB (ref 4.0–5.6)

## 2024-06-01 NOTE — Telephone Encounter (Signed)
 Requested Prescriptions  Pending Prescriptions Disp Refills   metFORMIN  (GLUCOPHAGE ) 500 MG tablet [Pharmacy Med Name: METFORMIN  HCL TABS 500MG ] 90 tablet 0    Sig: TAKE 1 TABLET DAILY     Endocrinology:  Diabetes - Biguanides Failed - 06/01/2024  2:33 PM      Failed - B12 Level in normal range and within 720 days    No results found for: VITAMINB12       Passed - Cr in normal range and within 360 days    Creatinine, Ser  Date Value Ref Range Status  12/31/2023 0.84 0.57 - 1.00 mg/dL Final         Passed - HBA1C is between 0 and 7.9 and within 180 days    Hemoglobin A1C  Date Value Ref Range Status  06/01/2024 5.7 (A) 4.0 - 5.6 % Final   Hgb A1c MFr Bld  Date Value Ref Range Status  12/31/2023 6.3 (H) 4.8 - 5.6 % Final    Comment:             Prediabetes: 5.7 - 6.4          Diabetes: >6.4          Glycemic control for adults with diabetes: <7.0          Passed - eGFR in normal range and within 360 days    GFR calc Af Amer  Date Value Ref Range Status  08/28/2020 72 >59 mL/min/1.73 Final    Comment:    **In accordance with recommendations from the NKF-ASN Task force,**   [facility] is in the process of updating its eGFR calculation to the   2021 CKD-EPI creatinine equation that estimates kidney function   without a race variable.    GFR, Estimated  Date Value Ref Range Status  02/06/2022 >60 >60 mL/min Final    Comment:    (NOTE) Calculated using the CKD-EPI Creatinine Equation (2021)    eGFR  Date Value Ref Range Status  12/31/2023 69 >59 mL/min/1.73 Final         Passed - Valid encounter within last 6 months    Recent Outpatient Visits           Today Type II diabetes mellitus with complication [facility])   [place] [facility] at [facility], Leita DEL, MD   5 months ago Essential (primary) hypertension   [place] [facility] at [facility], Leita DEL, MD              Passed - CBC within  normal limits and completed in the last 12 months    WBC  Date Value Ref Range Status  12/31/2023 7.5 3.4 - 10.8 x10E3/uL Final  02/06/2022 6.1 4.0 - 10.5 K/uL Final   RBC  Date Value Ref Range Status  12/31/2023 3.87 3.77 - 5.28 x10E6/uL Final  02/06/2022 4.03 3.87 - 5.11 MIL/uL Final   Hemoglobin  Date Value Ref Range Status  12/31/2023 12.7 11.1 - 15.9 g/dL Final   Hematocrit  Date Value Ref Range Status  12/31/2023 38.0 34.0 - 46.6 % Final   MCHC  Date Value Ref Range Status  12/31/2023 33.4 31.5 - 35.7 g/dL Final  94/89/7976 67.5 30.0 - 36.0 g/dL Final   [facility]  Date Value Ref Range Status  12/31/2023 32.8 26.6 - 33.0 pg Final  02/06/2022 30.3 26.0 - 34.0 pg Final   MCV  Date Value Ref Range Status  12/31/2023 98 (  H) 79 - 97 fL Final   No results found for: PLTCOUNTKUC, LABPLAT, POCPLA RDW  Date Value Ref Range Status  12/31/2023 11.9 11.7 - 15.4 % Final

## 2024-06-01 NOTE — Assessment & Plan Note (Addendum)
 Blood sugars have been stable.  No hypoglycemic events since last visit.  Lowest blood sugar 90 without sx. Currently medications are MTF. Last visit medical regimen changes were none. Lab Results  Component Value Date   HGBA1C 6.3 (H) 12/31/2023   A1C today = 5.7.  She can liberalize her diet some if desired.

## 2024-06-01 NOTE — Assessment & Plan Note (Signed)
 Blood pressure is well controlled on lisinopril  and spironolactone . No medication side effects noted. Plan to continue current medications.

## 2024-06-01 NOTE — Progress Notes (Signed)
 Date:  06/01/2024   Name:  Lori Harrell   DOB:  May 09, 1941   MRN:  [id number]   Chief Complaint: Diabetes and Hypertension  Diabetes She presents for her follow-up diabetic visit. She has type 2 diabetes mellitus. Her disease course has been stable. Pertinent negatives for hypoglycemia include no dizziness or headaches. Pertinent negatives for diabetes include no chest pain, no fatigue and no weakness. Current diabetic treatment includes oral agent (monotherapy). She is compliant with treatment all of the time.  Hypertension This is a chronic problem. The problem is controlled. Pertinent negatives include no chest pain, headaches, palpitations or shortness of breath. Past treatments include ACE inhibitors and diuretics. The current treatment provides significant improvement.    Review of Systems  Constitutional:  Negative for fatigue and unexpected weight change.  HENT:  Negative for trouble swallowing.   Eyes:  Negative for visual disturbance.  Respiratory:  Negative for cough, chest tightness, shortness of breath and wheezing.   Cardiovascular:  Negative for chest pain, palpitations and leg swelling.  Gastrointestinal:  Negative for abdominal pain, constipation and diarrhea.  Musculoskeletal:  Negative for arthralgias and myalgias.  Neurological:  Negative for dizziness, weakness, light-headedness and headaches.     Lab Results  Component Value Date   NA 139 12/31/2023   K 4.9 12/31/2023   CO2 21 12/31/2023   GLUCOSE 110 (H) 12/31/2023   BUN 17 12/31/2023   CREATININE 0.84 12/31/2023   CALCIUM  10.0 12/31/2023   EGFR 69 12/31/2023   GFRNONAA >60 02/06/2022   Lab Results  Component Value Date   CHOL 152 12/31/2023   HDL 42 12/31/2023   LDLCALC 88 12/31/2023   TRIG 122 12/31/2023   CHOLHDL 3.6 12/31/2023   Lab Results  Component Value Date   TSH 7.930 (H) 12/31/2023   Lab Results  Component Value Date   HGBA1C 5.7 (A) 06/01/2024   Lab Results  Component  Value Date   WBC 7.5 12/31/2023   HGB 12.7 12/31/2023   HCT 38.0 12/31/2023   MCV 98 (H) 12/31/2023   PLT 242 12/31/2023   Lab Results  Component Value Date   ALT 24 12/31/2023   AST 24 12/31/2023   ALKPHOS 55 12/31/2023   BILITOT 0.4 12/31/2023   Lab Results  Component Value Date   VD25OH 39.1 12/31/2023     Patient Active Problem List   Diagnosis Date Noted   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/27/2022   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   Basal cell carcinoma (BCC) of skin of lip 05/31/2021   Elevated TSH 12/15/2020   S/P lumbar fusion 05/19/2019   Red blood cell antibody positive 02/03/2019   DDD (degenerative disc disease), lumbar 01/07/2019   Carotid artery stenosis, asymptomatic, bilateral 07/07/2017   Primary open angle glaucoma 05/08/2017   Old partial retinal detachment 05/08/2017   Large hiatal hernia 07/04/2016   Sigmoid diverticulitis 07/04/2016   Stress incontinence 07/04/2016   Type II diabetes mellitus with complication (HCC) 10/31/2015   Constipation 10/31/2015   Barrett's esophagus without dysplasia 06/16/2015   Essential (primary) hypertension 06/16/2015   Hyperlipidemia associated with type 2 diabetes mellitus (HCC) 06/16/2015   Age-related osteoporosis without current pathological fracture 06/16/2015   Hypercalcemia 04/22/2012    No Known Allergies  Past Surgical History:  Procedure Laterality Date   ANTERIOR LATERAL LUMBAR FUSION WITH PERCUTANEOUS SCREW 2 LEVEL N/A 05/19/2019   Procedure: L2-4 LATERAL LUMBAR INTERBODY FUSION (XLIF);  Surgeon: Clois Fret, MD;  Location: [facility] ORS;  Service: Neurosurgery;  Laterality: N/A;   CATARACT EXTRACTION Bilateral    CHOLECYSTECTOMY, LAPAROSCOPIC  02/05/2019   [facility]   COLONOSCOPY  2007   sigmoid diverticulosis   ENDOSCOPIC RETROGRADE CHOLANGIOPANCREATOGRAPHY (ERCP) WITH PROPOFOL  N/A 02/02/2019   Procedure: ENDOSCOPIC RETROGRADE  CHOLANGIOPANCREATOGRAPHY (ERCP) WITH PROPOFOL ;  Surgeon: Jinny Carmine, MD;  Location: [facility] ENDOSCOPY;  Service: Endoscopy;  Laterality: N/A;   ESOPHAGOGASTRODUODENOSCOPY  04/22/2016   ESOPHAGOGASTRODUODENOSCOPY  04/26/2019   ESOPHAGOGASTRODUODENOSCOPY  09/15/2019   REVERSE SHOULDER ARTHROPLASTY Left 02/18/2022   Procedure: Left reverse shoulder arthroplasty, biceps tenodesis;  Surgeon: Tobie Priest, MD;  Location: [facility] ORS;  Service: Orthopedics;  Laterality: Left;   TONSILLECTOMY     UPPER GASTROINTESTINAL ENDOSCOPY  02/2013   Barrett's - done at [facility]   VAGINAL HYSTERECTOMY      Social History   Tobacco Use   Smoking status: Never   Smokeless tobacco: Never   Tobacco comments:    smoking cessation materials not required  Vaping Use   Vaping status: Never Used  Substance Use Topics   Alcohol use: No    Alcohol/week: 0.0 standard drinks of alcohol   Drug use: No     Medication list has been reviewed and updated.  Current Meds  Medication Sig   acetaminophen  (TYLENOL ) 500 MG tablet Take 1,000 mg by mouth 2 (two) times daily.   alendronate  (FOSAMAX ) 70 MG tablet Take 1 tablet (70 mg total) by mouth every 7 (seven) days. Take with a full glass of water  on an empty stomach.   atorvastatin  (LIPITOR) 20 MG tablet TAKE 1 TABLET DAILY   dorzolamide -timolol  (COSOPT ) 22.3-6.8 MG/ML ophthalmic solution Place 1 drop into both eyes 2 (two) times daily.   esomeprazole  (NEXIUM ) 40 MG capsule TAKE 1 CAPSULE TWICE A DAY   FREESTYLE LITE test strip USE TO TEST BLOOD SUGAR DAILY   Lancets (FREESTYLE) lancets USE 1 LANCET DAILY   latanoprost  (XALATAN ) 0.005 % ophthalmic solution Place 1 drop into both eyes at bedtime.   lisinopril  (ZESTRIL ) 20 MG tablet Take 1 tablet (20 mg total) by mouth daily.   Menthol , Topical Analgesic, (BIOFREEZE) 10 % LIQD Apply 1 application. topically 3 (three) times daily as needed (pain.).   metFORMIN  (GLUCOPHAGE ) 500 MG tablet Take 1 tablet (500 mg total) by mouth  daily.   Multiple Vitamin (MULTIVITAMIN WITH MINERALS) TABS tablet Take 1 tablet by mouth in the morning. Women's Centrum Silver   Multiple Vitamins-Minerals (PRESERVISION AREDS 2 PO) Take by mouth 2 (two) times daily.   nystatin  (MYCOSTATIN ) 100000 UNIT/ML suspension 1 tsp - swish, gargle and spit 4 times a day   polycarbophil (FIBERCON) 625 MG tablet Take 625 mg by mouth daily.   spironolactone  (ALDACTONE ) 25 MG tablet Take 1 tablet (25 mg total) by mouth daily.       06/01/2024    8:39 AM 12/31/2023    9:11 AM 04/21/2023    3:18 PM 12/17/2022    9:58 AM  GAD 7 : Generalized Anxiety Score  Nervous, Anxious, on Edge 0 0 0 0  Control/stop worrying 0 0 0 0  Worry too much - different things 0 0 0 0  Trouble relaxing 0 0 0 0  Restless 0 0 0 0  Easily annoyed or irritable 0 0 0 0  Afraid - awful might happen 0 0 0 0  Total GAD 7 Score 0 0 0 0  Anxiety Difficulty Not difficult at all Not difficult at  all Not difficult at all Not difficult at all       06/01/2024    8:39 AM 12/31/2023    9:11 AM 07/23/2023   11:01 AM  Depression screen PHQ 2/9  Decreased Interest 0 0 0  Down, Depressed, Hopeless 0 0 0  PHQ - 2 Score 0 0 0  Altered sleeping 1 2 0  Tired, decreased energy 1 1 0  Change in appetite 0 0 0  Feeling bad or failure about yourself  0 0 0  Trouble concentrating 0 0 0  Moving slowly or fidgety/restless 0 0 0  Suicidal thoughts 0 0 0  PHQ-9 Score 2 3 0  Difficult doing work/chores Not difficult at all Not difficult at all Not difficult at all    BP Readings from Last 3 Encounters:  06/01/24 112/74  12/31/23 134/76  08/21/23 128/76    Physical Exam Vitals and nursing note reviewed.  Constitutional:      General: She is not in acute distress.    Appearance: Normal appearance. She is well-developed.  HENT:     Head: Normocephalic and atraumatic.  Cardiovascular:     Rate and Rhythm: Normal rate and regular rhythm.     Heart sounds: No murmur heard. Pulmonary:      Effort: Pulmonary effort is normal. No respiratory distress.     Breath sounds: No wheezing or rhonchi.  Musculoskeletal:     Cervical back: Normal range of motion.     Right lower leg: No edema.     Left lower leg: No edema.  Lymphadenopathy:     Cervical: No cervical adenopathy.  Skin:    General: Skin is warm and dry.     Findings: No rash.  Neurological:     General: No focal deficit present.     Mental Status: She is alert and oriented to person, place, and time.  Psychiatric:        Mood and Affect: Mood normal.        Behavior: Behavior normal.     Wt Readings from Last 3 Encounters:  06/01/24 131 lb (59.4 kg)  12/31/23 133 lb 8 oz (60.6 kg)  11/21/23 132 lb (59.9 kg)    BP 112/74   Pulse 70   Ht 5' 1 (1.549 m)   Wt 131 lb (59.4 kg)   SpO2 97%   BMI 24.75 kg/m   Assessment and Plan:  Problem List Items Addressed This Visit       Unprioritized   Essential (primary) hypertension (Chronic)   Blood pressure is well controlled on lisinopril  and spironolactone . No medication side effects noted. Plan to continue current medications.       Type II diabetes mellitus with complication (HCC) - Primary (Chronic)   Blood sugars have been stable.  No hypoglycemic events since last visit.  Lowest blood sugar 90 without sx. Currently medications are MTF. Last visit medical regimen changes were none. Lab Results  Component Value Date   HGBA1C 6.3 (H) 12/31/2023   A1C today = 5.7.  She can liberalize her diet some if desired.       Relevant Orders   POCT glycosylated hemoglobin (Hb A1C) (Completed)   Other Visit Diagnoses       Long term current use of oral hypoglycemic drug         Encounter for immunization       Relevant Orders   Flu vaccine HIGH DOSE PF(Fluzone Trivalent) (Completed)  Return in about 4 months (around 10/01/2024) for DM, HTN.    Leita HILARIO Adie, MD [facility] [place]

## 2024-06-07 ENCOUNTER — Other Ambulatory Visit: Payer: Self-pay | Admitting: Internal Medicine

## 2024-06-07 DIAGNOSIS — E1169 Type 2 diabetes mellitus with other specified complication: Secondary | ICD-10-CM

## 2024-06-08 NOTE — Telephone Encounter (Signed)
 Requested Prescriptions  Pending Prescriptions Disp Refills   atorvastatin  (LIPITOR) 20 MG tablet [Pharmacy Med Name: ATORVASTATIN  TABS 20MG ] 90 tablet 1    Sig: TAKE 1 TABLET DAILY     Cardiovascular:  Antilipid - Statins Failed - 06/08/2024  3:23 PM      Failed - Lipid Panel in normal range within the last 12 months    Cholesterol, Total  Date Value Ref Range Status  12/31/2023 152 100 - 199 mg/dL Final   LDL Chol Calc (NIH)  Date Value Ref Range Status  12/31/2023 88 0 - 99 mg/dL Final   HDL  Date Value Ref Range Status  12/31/2023 42 >39 mg/dL Final   Triglycerides  Date Value Ref Range Status  12/31/2023 122 0 - 149 mg/dL Final         Passed - Patient is not pregnant      Passed - Valid encounter within last 12 months    Recent Outpatient Visits           1 week ago Type II diabetes mellitus with complication [facility])   [place] [facility] at [facility], Leita DEL, MD   5 months ago Essential (primary) hypertension   [facility] at [facility], Leita DEL, MD

## 2024-06-18 ENCOUNTER — Ambulatory Visit: Admitting: Anesthesiology

## 2024-06-18 ENCOUNTER — Encounter
Admission: RE | Disposition: A | Discharge status: Home / Self Care | Payer: Self-pay | Source: Home / Self Care | Attending: Gastroenterology

## 2024-06-18 ENCOUNTER — Ambulatory Visit
Admission: RE | Admit: 2024-06-18 | Discharge: 2024-06-18 | Disposition: A | Discharge status: Home / Self Care | Attending: Gastroenterology | Admitting: Gastroenterology

## 2024-06-18 ENCOUNTER — Other Ambulatory Visit: Payer: Self-pay

## 2024-06-18 DIAGNOSIS — K2289 Other specified disease of esophagus: Secondary | ICD-10-CM | POA: Diagnosis not present

## 2024-06-18 DIAGNOSIS — K2270 Barrett's esophagus without dysplasia: Secondary | ICD-10-CM | POA: Diagnosis not present

## 2024-06-18 DIAGNOSIS — Z7984 Long term (current) use of oral hypoglycemic drugs: Secondary | ICD-10-CM | POA: Insufficient documentation

## 2024-06-18 DIAGNOSIS — I6523 Occlusion and stenosis of bilateral carotid arteries: Secondary | ICD-10-CM | POA: Diagnosis not present

## 2024-06-18 DIAGNOSIS — K449 Diaphragmatic hernia without obstruction or gangrene: Secondary | ICD-10-CM | POA: Diagnosis not present

## 2024-06-18 DIAGNOSIS — K5710 Diverticulosis of small intestine without perforation or abscess without bleeding: Secondary | ICD-10-CM | POA: Diagnosis not present

## 2024-06-18 DIAGNOSIS — I10 Essential (primary) hypertension: Secondary | ICD-10-CM | POA: Insufficient documentation

## 2024-06-18 DIAGNOSIS — E7800 Pure hypercholesterolemia, unspecified: Secondary | ICD-10-CM | POA: Diagnosis not present

## 2024-06-18 DIAGNOSIS — K2950 Unspecified chronic gastritis without bleeding: Secondary | ICD-10-CM | POA: Diagnosis not present

## 2024-06-18 DIAGNOSIS — Z85828 Personal history of other malignant neoplasm of skin: Secondary | ICD-10-CM | POA: Insufficient documentation

## 2024-06-18 DIAGNOSIS — E119 Type 2 diabetes mellitus without complications: Secondary | ICD-10-CM | POA: Insufficient documentation

## 2024-06-18 HISTORY — PX: ESOPHAGOGASTRODUODENOSCOPY: SHX5428

## 2024-06-18 LAB — GLUCOSE, CAPILLARY: Glucose-Capillary: 108 mg/dL — ABNORMAL HIGH (ref 70–99)

## 2024-06-18 SURGERY — EGD (ESOPHAGOGASTRODUODENOSCOPY)
Anesthesia: General

## 2024-06-18 MED ORDER — LIDOCAINE HCL (PF) 2 % IJ SOLN
INTRAMUSCULAR | Status: AC
  Filled 2024-06-18: qty 5

## 2024-06-18 MED ORDER — GLYCOPYRROLATE 0.2 MG/ML IJ SOLN
INTRAMUSCULAR | Status: DC | PRN
  Administered 2024-06-18: .2 mg via INTRAVENOUS

## 2024-06-18 MED ORDER — PROPOFOL 500 MG/50ML IV EMUL
INTRAVENOUS | Status: DC | PRN
  Administered 2024-06-18: 50 ug/kg/min via INTRAVENOUS

## 2024-06-18 MED ORDER — LIDOCAINE HCL (CARDIAC) PF 100 MG/5ML IV SOSY
PREFILLED_SYRINGE | INTRAVENOUS | Status: DC | PRN
  Administered 2024-06-18: 60 mg via INTRAVENOUS

## 2024-06-18 MED ORDER — PROPOFOL 10 MG/ML IV BOLUS
INTRAVENOUS | Status: DC | PRN
  Administered 2024-06-18: 40 mg via INTRAVENOUS
  Administered 2024-06-18: 20 mg via INTRAVENOUS

## 2024-06-18 MED ORDER — GLYCOPYRROLATE 0.2 MG/ML IJ SOLN
INTRAMUSCULAR | Status: AC
  Filled 2024-06-18: qty 1

## 2024-06-18 MED ORDER — SODIUM CHLORIDE 0.9 % IV SOLN: INTRAVENOUS | Status: DC

## 2024-06-18 NOTE — Transfer of Care (Signed)
 Immediate Anesthesia Transfer of Care Note  Patient: Lori Harrell  Procedure(s) Performed: EGD (ESOPHAGOGASTRODUODENOSCOPY)  Patient Location: PACU  Anesthesia Type:General  Level of Consciousness: sedated  Airway & Oxygen Therapy: Patient Spontanous Breathing  Post-op Assessment: Report given to RN and Post -op Vital signs reviewed and stable  Post vital signs: Reviewed and stable  Last Vitals:  Vitals Value Taken Time  BP    Temp    Pulse    Resp 28 06/18/24 11:02  SpO2    Vitals shown include unfiled device data.  Last Pain:  Vitals:   06/18/24 1023  TempSrc: Temporal  PainSc: 0-No pain         Complications: No notable events documented.

## 2024-06-18 NOTE — Anesthesia Postprocedure Evaluation (Signed)
 Anesthesia Post Note  Patient: Lori Harrell  Procedure(s) Performed: EGD (ESOPHAGOGASTRODUODENOSCOPY)  Patient location during evaluation: Endoscopy Anesthesia Type: General Level of consciousness: awake and alert Pain management: pain level controlled Vital Signs Assessment: post-procedure vital signs reviewed and stable Respiratory status: spontaneous breathing, nonlabored ventilation and respiratory function stable Cardiovascular status: blood pressure returned to baseline and stable Postop Assessment: no apparent nausea or vomiting Anesthetic complications: no   No notable events documented.   Last Vitals:  Vitals:   06/18/24 1113 06/18/24 1123  BP: (!) 149/85 (!) 148/80  Pulse: 70 (!) 59  Resp: (!) 21 18  Temp:    SpO2: 100% 100%    Last Pain:  Vitals:   06/18/24 1123  TempSrc:   PainSc: 0-No pain                 Fairy POUR Jozi Malachi

## 2024-06-18 NOTE — Interval H&P Note (Signed)
 History and Physical Interval Note:  06/18/2024 10:45 AM  Lori Harrell  has presented today for surgery, with the diagnosis of BARRETT'S ESOPHAGUS WITHOUT DYSPLASIA.  The various methods of treatment have been discussed with the patient and family. After consideration of risks, benefits and other options for treatment, the patient has consented to  Procedure(s): EGD (ESOPHAGOGASTRODUODENOSCOPY) (N/A) as a surgical intervention.  The patient's history has been reviewed, patient examined, no change in status, stable for surgery.  I have reviewed the patient's chart and labs.  Questions were answered to the patient's satisfaction.     Ole ONEIDA Schick  Ok to proceed with EGD

## 2024-06-18 NOTE — Anesthesia Preprocedure Evaluation (Signed)
 Anesthesia Evaluation  Patient identified by MRN, date of birth, ID band Patient awake    Reviewed: Allergy & Precautions, NPO status , Patient's Chart, lab work & pertinent test results  History of Anesthesia Complications Negative for: history of anesthetic complications  Airway Mallampati: III  TM Distance: <3 FB Neck ROM: full    Dental  (+) Chipped   Pulmonary neg pulmonary ROS, neg shortness of breath   Pulmonary exam normal        Cardiovascular Exercise Tolerance: Good hypertension, (-) angina Normal cardiovascular exam     Neuro/Psych  Neuromuscular disease  negative psych ROS   GI/Hepatic Neg liver ROS, hiatal hernia,GERD  Controlled,,  Endo/Other  diabetes, Type 2    Renal/GU negative Renal ROS  negative genitourinary   Musculoskeletal   Abdominal   Peds  Hematology negative hematology ROS (+)   Anesthesia Other Findings Past Medical History: No date: Acid reflux 02/01/2019: Acute cholangitis No date: Anemia 07/07/2017: AVN (avascular necrosis of bone) (HCC) No date: Barrett's esophagus 05/31/2021: Basal cell carcinoma (BCC) of skin of lip No date: Choledocholithiasis No date: Diabetes mellitus without complication (HCC)     Comment:  type 2 No date: Elevated lipids No date: Glaucoma No date: Heart murmur No date: History of detached retina repair No date: History of hiatal hernia No date: Hypercholesterolemia No date: Hypertension No date: Osteoporosis No date: Sigmoid diverticulitis 01/07/2019: Unstageable pressure ulcer of back (HCC) 1992: Vitreous hemorrhage, right eye (HCC)  Past Surgical History: 05/19/2019: ANTERIOR LATERAL LUMBAR FUSION WITH PERCUTANEOUS SCREW 2  LEVEL; N/A     Comment:  Procedure: L2-4 LATERAL LUMBAR INTERBODY FUSION (XLIF);               Surgeon: Clois Fret, MD;  Location: [facility] ORS;                Service: Neurosurgery;  Laterality: N/A; No date:  CATARACT EXTRACTION; Bilateral 02/05/2019: CHOLECYSTECTOMY, LAPAROSCOPIC     Comment:  [facility] 2007: COLONOSCOPY     Comment:  sigmoid diverticulosis 02/02/2019: ENDOSCOPIC RETROGRADE CHOLANGIOPANCREATOGRAPHY (ERCP)  WITH PROPOFOL ; N/A     Comment:  Procedure: ENDOSCOPIC RETROGRADE               CHOLANGIOPANCREATOGRAPHY (ERCP) WITH PROPOFOL ;  Surgeon:               Jinny Carmine, MD;  Location: [facility] ENDOSCOPY;  Service:               Endoscopy;  Laterality: N/A; 04/22/2016: ESOPHAGOGASTRODUODENOSCOPY 04/26/2019: ESOPHAGOGASTRODUODENOSCOPY 09/15/2019: ESOPHAGOGASTRODUODENOSCOPY 02/18/2022: REVERSE SHOULDER ARTHROPLASTY; Left     Comment:  Procedure: Left reverse shoulder arthroplasty, biceps               tenodesis;  Surgeon: Tobie Priest, MD;  Location: [facility]               ORS;  Service: Orthopedics;  Laterality: Left; No date: TONSILLECTOMY 02/2013: UPPER GASTROINTESTINAL ENDOSCOPY     Comment:  Barrett's - done at [facility] No date: VAGINAL HYSTERECTOMY     Reproductive/Obstetrics negative OB ROS                              Anesthesia Physical Anesthesia Plan  ASA: 3  Anesthesia Plan: General   Post-op Pain Management:    Induction: Intravenous  PONV Risk Score and Plan: Propofol  infusion and TIVA  Airway Management Planned: Natural Airway and Nasal Cannula  Additional Equipment:  Intra-op Plan:   Post-operative Plan:   Informed Consent: I have reviewed the patients History and Physical, chart, labs and discussed the procedure including the risks, benefits and alternatives for the proposed anesthesia with the patient or authorized representative who has indicated his/her understanding and acceptance.     Dental Advisory Given  Plan Discussed with: Anesthesiologist, CRNA and Surgeon  Anesthesia Plan Comments: (Patient consented for risks of anesthesia including but not limited to:  - adverse reactions to medications - risk of airway  placement if required - damage to eyes, teeth, lips or other oral mucosa - nerve damage due to positioning  - sore throat or hoarseness - Damage to heart, brain, nerves, lungs, other parts of body or loss of life  Patient voiced understanding and assent.)        Anesthesia Quick Evaluation

## 2024-06-18 NOTE — H&P (Signed)
 Outpatient short stay form Pre-procedure 06/18/2024  Ole ONEIDA Schick, MD  Primary Physician: Justus Leita DEL, MD  Reason for visit:  Barrett's esophagus without dysplasia  History of present illness:    83 y/o lady with history of BE without dysplasia, DM II, and hypertension here for EGD. No blood thinners. No family history of GI malignancies. No neck surgeries.    Current Facility-Administered Medications:    0.9 %  sodium chloride  infusion, , Intravenous, Continuous, Sakshi Sermons, Ole ONEIDA, MD, Last Rate: 20 mL/hr at 06/18/24 1037, Continued from Pre-op at 06/18/24 1037  Medications Prior to Admission  Medication Sig Dispense Refill Last Dose/Taking   acetaminophen  (TYLENOL ) 500 MG tablet Take 1,000 mg by mouth 2 (two) times daily.   06/17/2024   atorvastatin  (LIPITOR) 20 MG tablet TAKE 1 TABLET DAILY 90 tablet 1 06/17/2024   dorzolamide -timolol  (COSOPT ) 22.3-6.8 MG/ML ophthalmic solution Place 1 drop into both eyes 2 (two) times daily.   06/17/2024   esomeprazole  (NEXIUM ) 40 MG capsule TAKE 1 CAPSULE TWICE A DAY 180 capsule 1 06/17/2024   latanoprost  (XALATAN ) 0.005 % ophthalmic solution Place 1 drop into both eyes at bedtime.   06/17/2024   lisinopril  (ZESTRIL ) 20 MG tablet Take 1 tablet (20 mg total) by mouth daily. 90 tablet 3 06/18/2024 Morning   metFORMIN  (GLUCOPHAGE ) 500 MG tablet TAKE 1 TABLET DAILY 90 tablet 0 06/17/2024   Multiple Vitamin (MULTIVITAMIN WITH MINERALS) TABS tablet Take 1 tablet by mouth in the morning. Women's Centrum Silver   06/17/2024   Multiple Vitamins-Minerals (PRESERVISION AREDS 2 PO) Take by mouth 2 (two) times daily.   06/17/2024   spironolactone  (ALDACTONE ) 25 MG tablet Take 1 tablet (25 mg total) by mouth daily. 90 tablet 3 06/17/2024   alendronate  (FOSAMAX ) 70 MG tablet Take 1 tablet (70 mg total) by mouth every 7 (seven) days. Take with a full glass of water  on an empty stomach. 12 tablet 3 06/12/2024   FREESTYLE LITE test strip USE TO TEST BLOOD SUGAR  DAILY 100 strip 3    Lancets (FREESTYLE) lancets USE 1 LANCET DAILY 100 each 3    Menthol , Topical Analgesic, (BIOFREEZE) 10 % LIQD Apply 1 application. topically 3 (three) times daily as needed (pain.).      nystatin  (MYCOSTATIN ) 100000 UNIT/ML suspension 1 tsp - swish, gargle and spit 4 times a day 60 mL 0    polycarbophil (FIBERCON) 625 MG tablet Take 625 mg by mouth daily.        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    Physical Exam  Gen: Alert, oriented. Appears stated age.  HEENT: PERRLA. Lungs: No respiratory distress CV: RRR Abd: soft, benign, no masses Ext: No edema    Planned procedures: Proceed  with EGD. The patient understands the nature of the planned procedure, indications, risks, alternatives and potential complications including but not limited to bleeding, infection, perforation, damage to internal organs and possible oversedation/side effects from anesthesia. The patient agrees and gives consent to proceed.  Please refer to procedure notes for findings, recommendations and patient disposition/instructions.     Ole ONEIDA Schick, MD [facility]

## 2024-06-18 NOTE — Op Note (Signed)
 [facility] Patient Name: Lori Harrell Procedure Date: 06/18/2024 10:45 AM MRN: [id number] Account #: [id number] Date of Birth: 11/06/1940 Admit Type: Outpatient Age: 83 Room: [facility] ENDO ROOM 3 Gender: Female Note Status: Finalized Instrument Name: [facility] GI Scope [id number] Procedure:             Upper GI endoscopy Indications:           Barrett's esophagus Providers:             Ole Schick MD, MD Referring MD:          Leita Adie, MD (Referring MD) Medicines:             Monitored Anesthesia Care Complications:         No immediate complications. Estimated blood loss:                         Minimal. Procedure:             Pre-Anesthesia Assessment:                        - Prior to the procedure, a History and Physical was                         performed, and patient medications and allergies were                         reviewed. The patient is competent. The risks and                         benefits of the procedure and the sedation options and                         risks were discussed with the patient. All questions                         were answered and informed consent was obtained.                         Patient identification and proposed procedure were                         verified by the physician, the nurse, the                         anesthesiologist, the anesthetist and the technician                         in the endoscopy suite. Mental Status Examination:                         alert and oriented. Airway Examination: normal                         oropharyngeal airway and neck mobility. Respiratory                         Examination: clear to auscultation. CV Examination:  normal. Prophylactic Antibiotics: The patient does not                         require prophylactic antibiotics. Prior                         Anticoagulants: The patient has taken no anticoagulant                          or antiplatelet agents. ASA Grade Assessment: III - A                         patient with severe systemic disease. After reviewing                         the risks and benefits, the patient was deemed in                         satisfactory condition to undergo the procedure. The                         anesthesia plan was to use monitored anesthesia care                         (MAC). Immediately prior to administration of                         medications, the patient was re-assessed for adequacy                         to receive sedatives. The heart rate, respiratory                         rate, oxygen saturations, blood pressure, adequacy of                         pulmonary ventilation, and response to care were                         monitored throughout the procedure. The physical                         status of the patient was re-assessed after the                         procedure.                        After obtaining informed consent, the endoscope was                         passed under direct vision. Throughout the procedure,                         the patient's blood pressure, pulse, and oxygen                         saturations were monitored continuously. The Endoscope  was introduced through the mouth, and advanced to the                         second part of duodenum. The upper GI endoscopy was                         somewhat difficult due to abnormal anatomy. The                         patient tolerated the procedure well. Findings:      A large hiatal hernia was present. Appeared that much of the stomach was       in the chest which made it difficult to delineate landmarks.      The lower third of the esophagus was significantly tortuous.      There were esophageal mucosal changes secondary to established       long-segment Barrett's disease present in the lower third of the       esophagus. The maximum longitudinal extent  of these mucosal changes was       3 cm in length. Mucosa was biopsied with a cold forceps for histology in       4 quadrants at intervals of 2 cm in the lower third of the esophagus. A       total of 2 specimen bottles were sent to pathology. Estimated blood loss       was minimal.      The entire examined stomach was normal.      A large non-bleeding diverticulum was found in the second portion of the       duodenum. Impression:            - Large hiatal hernia.                        - Tortuous esophagus.                        - Esophageal mucosal changes secondary to established                         long-segment Barrett's disease. Biopsied.                        - Normal stomach.                        - Non-bleeding duodenal diverticulum. Recommendation:        - Discharge patient to home.                        - Resume previous diet.                        - Continue present medications.                        - Await pathology results.                        - Repeat upper endoscopy is not recommended for  surveillance of Barrett's esophagus.                        - Return to referring physician as previously                         scheduled. Procedure Code(s):     --- Professional ---                        [phone], Esophagogastroduodenoscopy, flexible,                         transoral; with biopsy, single or multiple Diagnosis Code(s):     --- Professional ---                        K44.9, Diaphragmatic hernia without obstruction or                         gangrene                        Q39.9, Congenital malformation of esophagus,                         unspecified                        K22.70, Barrett's esophagus without dysplasia                        K57.10, Diverticulosis of small intestine without                         perforation or abscess without bleeding CPT copyright 2022 American Medical Association. All rights reserved. The  codes documented in this report are preliminary and upon coder review may  be revised to meet current compliance requirements. Ole Schick MD, MD 06/18/2024 11:04:09 AM Number of Addenda: 0 Note Initiated On: 06/18/2024 10:45 AM Estimated Blood Loss:  Estimated blood loss was minimal.      [facility]

## 2024-06-21 LAB — SURGICAL PATHOLOGY

## 2024-06-30 DIAGNOSIS — H3581 Retinal edema: Secondary | ICD-10-CM | POA: Diagnosis not present

## 2024-06-30 DIAGNOSIS — E113213 Type 2 diabetes mellitus with mild nonproliferative diabetic retinopathy with macular edema, bilateral: Secondary | ICD-10-CM | POA: Diagnosis not present

## 2024-06-30 DIAGNOSIS — H26493 Other secondary cataract, bilateral: Secondary | ICD-10-CM | POA: Diagnosis not present

## 2024-06-30 DIAGNOSIS — H401133 Primary open-angle glaucoma, bilateral, severe stage: Secondary | ICD-10-CM | POA: Diagnosis not present

## 2024-06-30 DIAGNOSIS — H35342 Macular cyst, hole, or pseudohole, left eye: Secondary | ICD-10-CM | POA: Diagnosis not present

## 2024-07-14 ENCOUNTER — Other Ambulatory Visit: Payer: Self-pay | Admitting: Internal Medicine

## 2024-07-14 DIAGNOSIS — K2270 Barrett's esophagus without dysplasia: Secondary | ICD-10-CM

## 2024-07-16 NOTE — Telephone Encounter (Signed)
 Requested Prescriptions  Pending Prescriptions Disp Refills   esomeprazole  (NEXIUM ) 40 MG capsule [Pharmacy Med Name: ESOMEPRAZOLE  MAGNESIUM  DR CAPS 40MG ] 180 capsule 1    Sig: TAKE 1 CAPSULE TWICE A DAY     Gastroenterology: Proton Pump Inhibitors 2 Passed - 07/16/2024 12:13 PM      Passed - ALT in normal range and within 360 days    ALT  Date Value Ref Range Status  12/31/2023 24 0 - 32 IU/L Final         Passed - AST in normal range and within 360 days    AST  Date Value Ref Range Status  12/31/2023 24 0 - 40 IU/L Final         Passed - Valid encounter within last 12 months    Recent Outpatient Visits           1 month ago Type II diabetes mellitus with complication [facility])   [place] [facility] at [facility], Leita DEL, MD   6 months ago Essential (primary) hypertension   [facility] at [facility], Leita DEL, MD

## 2024-07-21 ENCOUNTER — Telehealth: Payer: Self-pay

## 2024-07-21 NOTE — Telephone Encounter (Signed)
 Copied from CRM #[id number]. Topic: Appointments - Appointment Scheduling >> Jul 21, 2024 12:16 PM Travis F wrote: Patient is calling in because she wants to know if her Annual Wellness Visit can be changed to an over the phone visit on 07/28/24 because she is unsure if her son will be able to bring her in person that day. Please follow up with patient.

## 2024-07-26 ENCOUNTER — Encounter (INDEPENDENT_AMBULATORY_CARE_PROVIDER_SITE_OTHER): Admitting: Ophthalmology

## 2024-07-26 DIAGNOSIS — H43813 Vitreous degeneration, bilateral: Secondary | ICD-10-CM

## 2024-07-26 DIAGNOSIS — I10 Essential (primary) hypertension: Secondary | ICD-10-CM | POA: Diagnosis not present

## 2024-07-26 DIAGNOSIS — H353132 Nonexudative age-related macular degeneration, bilateral, intermediate dry stage: Secondary | ICD-10-CM | POA: Diagnosis not present

## 2024-07-26 DIAGNOSIS — H338 Other retinal detachments: Secondary | ICD-10-CM | POA: Diagnosis not present

## 2024-07-26 DIAGNOSIS — H35033 Hypertensive retinopathy, bilateral: Secondary | ICD-10-CM | POA: Diagnosis not present

## 2024-07-26 DIAGNOSIS — H33302 Unspecified retinal break, left eye: Secondary | ICD-10-CM | POA: Diagnosis not present

## 2024-08-14 ENCOUNTER — Other Ambulatory Visit: Payer: Self-pay | Admitting: Internal Medicine

## 2024-08-14 DIAGNOSIS — E118 Type 2 diabetes mellitus with unspecified complications: Secondary | ICD-10-CM

## 2024-08-14 DIAGNOSIS — E119 Type 2 diabetes mellitus without complications: Secondary | ICD-10-CM

## 2024-08-17 NOTE — Telephone Encounter (Signed)
 Requested Prescriptions  Pending Prescriptions Disp Refills   metFORMIN  (GLUCOPHAGE ) 500 MG tablet [Pharmacy Med Name: METFORMIN  HCL TABS 500MG ] 90 tablet 1    Sig: TAKE 1 TABLET DAILY     Endocrinology:  Diabetes - Biguanides Failed - 08/17/2024 11:49 AM      Failed - B12 Level in normal range and within 720 days    No results found for: VITAMINB12       Passed - Cr in normal range and within 360 days    Creatinine, Ser  Date Value Ref Range Status  12/31/2023 0.84 0.57 - 1.00 mg/dL Final         Passed - HBA1C is between 0 and 7.9 and within 180 days    Hemoglobin A1C  Date Value Ref Range Status  06/01/2024 5.7 (A) 4.0 - 5.6 % Final   Hgb A1c MFr Bld  Date Value Ref Range Status  12/31/2023 6.3 (H) 4.8 - 5.6 % Final    Comment:             Prediabetes: 5.7 - 6.4          Diabetes: >6.4          Glycemic control for adults with diabetes: <7.0          Passed - eGFR in normal range and within 360 days    GFR calc Af Amer  Date Value Ref Range Status  08/28/2020 72 >59 mL/min/1.73 Final    Comment:    **In accordance with recommendations from the NKF-ASN Task force,**   [facility] is in the process of updating its eGFR calculation to the   2021 CKD-EPI creatinine equation that estimates kidney function   without a race variable.    GFR, Estimated  Date Value Ref Range Status  02/06/2022 >60 >60 mL/min Final    Comment:    (NOTE) Calculated using the CKD-EPI Creatinine Equation (2021)    eGFR  Date Value Ref Range Status  12/31/2023 69 >59 mL/min/1.73 Final         Passed - Valid encounter within last 6 months    Recent Outpatient Visits           2 months ago Type II diabetes mellitus with complication [facility])   [place] [facility] at [facility], Leita DEL, MD   7 months ago Essential (primary) hypertension   [place] [facility] at [facility], Leita DEL, MD              Passed -  CBC within normal limits and completed in the last 12 months    WBC  Date Value Ref Range Status  12/31/2023 7.5 3.4 - 10.8 x10E3/uL Final  02/06/2022 6.1 4.0 - 10.5 K/uL Final   RBC  Date Value Ref Range Status  12/31/2023 3.87 3.77 - 5.28 x10E6/uL Final  02/06/2022 4.03 3.87 - 5.11 MIL/uL Final   Hemoglobin  Date Value Ref Range Status  12/31/2023 12.7 11.1 - 15.9 g/dL Final   Hematocrit  Date Value Ref Range Status  12/31/2023 38.0 34.0 - 46.6 % Final   MCHC  Date Value Ref Range Status  12/31/2023 33.4 31.5 - 35.7 g/dL Final  94/89/7976 67.5 30.0 - 36.0 g/dL Final   [facility]  Date Value Ref Range Status  12/31/2023 32.8 26.6 - 33.0 pg Final  02/06/2022 30.3 26.0 - 34.0 pg Final   MCV  Date Value Ref Range Status  12/31/2023  98 (H) 79 - 97 fL Final   No results found for: PLTCOUNTKUC, LABPLAT, POCPLA RDW  Date Value Ref Range Status  12/31/2023 11.9 11.7 - 15.4 % Final          Lancets (FREESTYLE) lancets [Pharmacy Med Name: LANCETS FREESTYLE 100'S 28G] 100 each 3    Sig: USE 1 LANCET DAILY     Endocrinology: Diabetes - Testing Supplies Passed - 08/17/2024 11:49 AM      Passed - Valid encounter within last 12 months    Recent Outpatient Visits           2 months ago Type II diabetes mellitus with complication [facility])   [place] [facility] at [facility], Leita DEL, MD   7 months ago Essential (primary) hypertension   [facility] at [facility], Leita DEL, MD

## 2024-09-03 ENCOUNTER — Ambulatory Visit: Admitting: Podiatry

## 2024-09-03 ENCOUNTER — Encounter: Payer: Self-pay | Admitting: Podiatry

## 2024-09-03 DIAGNOSIS — M79675 Pain in left toe(s): Secondary | ICD-10-CM

## 2024-09-03 DIAGNOSIS — E1142 Type 2 diabetes mellitus with diabetic polyneuropathy: Secondary | ICD-10-CM

## 2024-09-03 DIAGNOSIS — M79674 Pain in right toe(s): Secondary | ICD-10-CM | POA: Diagnosis not present

## 2024-09-03 DIAGNOSIS — B351 Tinea unguium: Secondary | ICD-10-CM | POA: Diagnosis not present

## 2024-09-03 NOTE — Progress Notes (Signed)
  Subjective:  Patient ID: Lori Harrell, female    DOB: 08/10/1941,  MRN: [id number]  Lori Harrell presents to clinic today for preventative diabetic foot care for painful elongated mycotic toenails 1-5 bilaterally which are tender when wearing enclosed shoe gear. Pain is relieved with periodic professional debridement. Patient states she thinks she is starting to have some neuropathy in her feet as she has been experiencing burning pain in her feet at times. It does not keep her awake at night. Chief Complaint  Patient presents with   Nail Problem    DFC. She saw Dr. Justus in Sept. A1c 5.6 per patient report    In January 2026, new PCP will be Lemon Raisin, MD. Her longtime PCP, Dr. Leita Justus is retiring.  No Known Allergies  Review of Systems: Negative except as noted in the HPI.  Objective: No changes noted in today's physical examination. There were no vitals filed for this visit. Lori Harrell is a pleasant 83 y.o. female WD, WN in NAD. AAO x 3.  Vascular Examination: Vascular status intact b/l with palpable pedal pulses. CFT immediate b/l. No edema. No pain with calf compression b/l. Skin temperature gradient WNL b/l. Pedal hair sparse. No cyanosis or clubbing noted b/l LE.  Neurological Examination: Pt has subjective symptoms of neuropathy. Sensation grossly intact b/l with 10 gram monofilament. Vibratory sensation intact b/l.   Dermatological Examination: Pedal skin with normal turgor, texture and tone b/l. Toenails 1-5 b/l thick, discolored, elongated with subungual debris and pain on dorsal palpation. No hyperkeratotic lesions noted b/l.   Musculoskeletal Examination: Muscle strength 5/5 to b/l LE. Palpable exostosis noted dorsomedial midfoot joint of b/l feet. Hammertoes 2-5 b/l  Patient ambulates with cane assistance.  Radiographs: None  Assessment/Plan: 1. Pain due to onychomycosis of toenails of both feet   2. Diabetic peripheral  neuropathy associated with type 2 diabetes mellitus (HCC)   -Consent given for treatment as described below: -Examined patient. -Discussed neuropathy causes, symptoms and treatment options. Advised patient/POA to purchase Nervive Pain Cream. Apply to feet before bedtime. If cream does not relieve symptoms, she may discuss with her new PCP, Dr. Raisin Lemon, in January. -Continue foot and shoe inspections daily. Monitor blood glucose per PCP/Endocrinologist's recommendations. -Patient to continue soft, supportive shoe gear daily. -Toenails 1-5 b/l were debrided in length and girth with sterile nail nippers and dremel without iatrogenic bleeding.  -Patient/POA to call should there be question/concern in the interim.   Return in about 3 months (around 12/02/2024).  Delon LITTIE Merlin, DPM      [place] LOCATION: [address], [place] [address]                   Office [phone]   [facility] LOCATION: [address] [place], [place] [address] Office [phone]

## 2024-09-08 ENCOUNTER — Ambulatory Visit

## 2024-09-08 DIAGNOSIS — Z0000 Encounter for general adult medical examination without abnormal findings: Secondary | ICD-10-CM | POA: Diagnosis not present

## 2024-09-08 NOTE — Progress Notes (Signed)
 No chief complaint on file.    Subjective:   Lori Harrell is a 83 y.o. female who presents for a Medicare Annual Wellness Visit.  Fall Screening Falls in the past year?: 0 Number of falls in past year: 0 Was there an injury with Fall?: 0 Fall Risk Category Calculator: 0 Patient Fall Risk Level: Moderate fall risk  Fall Risk Patient at Risk for Falls Due to: No Fall Risks Fall risk Follow up: Falls evaluation completed  Advance Directives (For Healthcare) Does Patient Have a Medical Advance Directive?: Yes Type of Advance Directive: Living will; Healthcare Power of Attorney    Allergies (verified) Patient has no known allergies.   Current Medications (verified) Outpatient Encounter Medications as of 09/08/2024  Medication Sig   acetaminophen  (TYLENOL ) 500 MG tablet Take 1,000 mg by mouth 2 (two) times daily.   alendronate  (FOSAMAX ) 70 MG tablet Take 1 tablet (70 mg total) by mouth every 7 (seven) days. Take with a full glass of water  on an empty stomach.   atorvastatin  (LIPITOR) 20 MG tablet TAKE 1 TABLET DAILY   dorzolamide -timolol  (COSOPT ) 22.3-6.8 MG/ML ophthalmic solution Place 1 drop into both eyes 2 (two) times daily.   esomeprazole  (NEXIUM ) 40 MG capsule TAKE 1 CAPSULE TWICE A DAY   FREESTYLE LITE test strip USE TO TEST BLOOD SUGAR DAILY   Lancets (FREESTYLE) lancets USE 1 LANCET DAILY   latanoprost  (XALATAN ) 0.005 % ophthalmic solution Place 1 drop into both eyes at bedtime.   lisinopril  (ZESTRIL ) 20 MG tablet Take 1 tablet (20 mg total) by mouth daily.   Menthol , Topical Analgesic, (BIOFREEZE) 10 % LIQD Apply 1 application. topically 3 (three) times daily as needed (pain.).   metFORMIN  (GLUCOPHAGE ) 500 MG tablet TAKE 1 TABLET DAILY   Multiple Vitamin (MULTIVITAMIN WITH MINERALS) TABS tablet Take 1 tablet by mouth in the morning. Women's Centrum Silver   Multiple Vitamins-Minerals (PRESERVISION AREDS 2 PO) Take by mouth 2 (two) times daily.   nystatin   (MYCOSTATIN ) 100000 UNIT/ML suspension 1 tsp - swish, gargle and spit 4 times a day   polycarbophil (FIBERCON) 625 MG tablet Take 625 mg by mouth daily.   spironolactone  (ALDACTONE ) 25 MG tablet Take 1 tablet (25 mg total) by mouth daily.   No facility-administered encounter medications on file as of 09/08/2024.    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   Barrett's esophagus    Basal cell carcinoma (BCC) of skin of lip 05/31/2021   Choledocholithiasis    Diabetes mellitus without complication (HCC)    type 2   Elevated lipids    Glaucoma    Heart murmur    History of detached retina repair    History of hiatal hernia    Hypercholesterolemia    Hypertension    Osteoporosis    Sigmoid diverticulitis    Unstageable pressure ulcer of back (HCC) 01/07/2019   Vitreous hemorrhage, right eye (HCC) 1992   Past Surgical History:  Procedure Laterality Date   ANTERIOR LATERAL LUMBAR FUSION WITH PERCUTANEOUS SCREW 2 LEVEL N/A 05/19/2019   Procedure: L2-4 LATERAL LUMBAR INTERBODY FUSION (XLIF);  Surgeon: Clois Fret, MD;  Location: [facility] ORS;  Service: Neurosurgery;  Laterality: N/A;   CATARACT EXTRACTION Bilateral    CHOLECYSTECTOMY, LAPAROSCOPIC  02/05/2019   [facility]   COLONOSCOPY  2007   sigmoid diverticulosis   ENDOSCOPIC RETROGRADE CHOLANGIOPANCREATOGRAPHY (  ERCP) WITH PROPOFOL  N/A 02/02/2019   Procedure: ENDOSCOPIC RETROGRADE CHOLANGIOPANCREATOGRAPHY (ERCP) WITH PROPOFOL ;  Surgeon: Jinny Carmine, MD;  Location: [facility] ENDOSCOPY;  Service: Endoscopy;  Laterality: N/A;   ESOPHAGOGASTRODUODENOSCOPY  04/22/2016   ESOPHAGOGASTRODUODENOSCOPY  04/26/2019   ESOPHAGOGASTRODUODENOSCOPY  09/15/2019   ESOPHAGOGASTRODUODENOSCOPY N/A 06/18/2024   Procedure: EGD (ESOPHAGOGASTRODUODENOSCOPY);  Surgeon: Maryruth Ole DASEN, MD;  Location[facility] END[facility];  Service: Endoscopy;   Laterality: N/A;   REVERSE SHOULDER ARTHROPLASTY Left 02/18/2022   Procedure: Left reverse shoulder arthroplasty, biceps tenodesis;  Surgeon: Tobie Priest, MD;  Location: [facility] ORS;  Service: Orthopedics;  Laterality: Left;   TONSILLECTOMY     UPPER GASTROINTESTINAL ENDOSCOPY  02/2013   Barrett's - done at [facility]   VAGINAL HYSTERECTOMY     Family History  Problem Relation Age of Onset   Diabetes Father    CAD Father    Breast cancer Neg Hx    Social History   Occupational History   Occupation: Retired  Tobacco Use   Smoking status: Never   Smokeless tobacco: Never   Tobacco comments:    smoking cessation materials not required  Vaping Use   Vaping status: Never Used  Substance and Sexual Activity   Alcohol use: No    Alcohol/week: 0.0 standard drinks of alcohol   Drug use: No   Sexual activity: Not Currently   Tobacco Counseling Counseling given: Not Answered Tobacco comments: smoking cessation materials not required  SDOH Screenings   Food Insecurity: No Food Insecurity (03/29/2024)   Received from [facility]  Housing: Low Risk  (03/29/2024)   Received from [facility]  Transportation Needs: No Transportation Needs (03/29/2024)   Received from [facility]  Utilities: Not At Risk (03/29/2024)   [facility]  Alcohol Screen: Low Risk  (07/23/2023)  Depression (PHQ2-9): Low Risk  (06/01/2024)  Financial Resource Strain: Low Risk  (03/29/2024)   Received from [facility]  Physical Activity: Sufficiently Active (07/23/2023)  Social Connections: Moderately Isolated (07/23/2023)  Stress: No Stress Concern Present (07/23/2023)  Tobacco Use: Low Risk  (09/03/2024)  Health Literacy: Adequate Health Literacy (07/23/2023)   See flowsheets for full screening details  Depression Screen PHQ 2 & 9 Depression Scale- Over the past 2 weeks, how often have you been bothered by any of  the following problems? Little interest or pleasure in doing things: 0 Feeling down, depressed, or hopeless (PHQ Adolescent also includes...irritable): 0 PHQ-2 Total Score: 0 Trouble falling or staying asleep, or sleeping too much: 1 Feeling tired or having little energy: 1 Poor appetite or overeating (PHQ Adolescent also includes...weight loss): 0 Feeling bad about yourself - or that you are a failure or have let yourself or your family down: 0 Trouble concentrating on things, such as reading the newspaper or watching television (PHQ Adolescent also includes...like school work): 0 Moving or speaking so slowly that other people could have noticed. Or the opposite - being so fidgety or restless that you have been moving around a lot more than usual: 0 Thoughts that you would be better off dead, or of hurting yourself in some way: 0 PHQ-9 Total Score: 2 If you checked off any problems, how difficult have these problems made it for you to do your work, take care of things at home, or get along with other people?: Not difficult at all     Goals Addressed   None          Objective:  There were no vitals filed for this visit. There is no height or weight on file to calculate BMI.  Hearing/Vision screen No results found. Immunizations and Health Maintenance Health Maintenance  Topic Date Due   Zoster Vaccines- Shingrix (1 of 2) Never done   DTaP/Tdap/Td (2 - Td or Tdap) 10/01/2020   COVID-19 Vaccine (4 - 2025-26 season) 05/31/2024   Medicare Annual Wellness (AWV)  07/22/2024   HEMOGLOBIN A1C  11/29/2024   Diabetic kidney evaluation - eGFR measurement  12/30/2024   Diabetic kidney evaluation - Urine ACR  12/30/2024   FOOT EXAM  12/30/2024   OPHTHALMOLOGY EXAM  06/30/2025   Pneumococcal Vaccine: 50+ Years  Completed   Influenza Vaccine  Completed   Bone Density Scan  Completed   Meningococcal B Vaccine  Aged Out   Mammogram  Discontinued   Hepatitis C Screening  Discontinued         Assessment/Plan:  This is a routine wellness examination for Lori Harrell.  Patient Care Team: Lemon Raisin, MD as PCP - General (Internal Medicine) Charlean, Toribio Elbe, MD as Consulting Physician (Gastroenterology) Leora Lynwood SAUNDERS, MD as Consulting Physician (Orthopedic Surgery) Clois Fret, MD as Consulting Physician (Neurosurgery) Amy Lu (Ophthalmology)  I have personally reviewed and noted the following in the patients chart:   Medical and social history Use of alcohol, tobacco or illicit drugs  Current medications and supplements including opioid prescriptions. Functional ability and status Nutritional status Physical activity Advanced directives List of other physicians Hospitalizations, surgeries, and ER visits in previous 12 months Vitals Screenings to include cognitive, depression, and falls Referrals and appointments  No orders of the defined types were placed in this encounter.  In addition, I have reviewed and discussed with patient certain preventive protocols, quality metrics, and best practice recommendations. A written personalized care plan for preventive services as well as general preventive health recommendations were provided to patient.   Lori GORMAN Das, LPN   [id number]   No follow-ups on file.  After Visit Summary: printed & mailed to patient  Nurse Notes: NEEDS TDAP; DECLINES SHINGRIX;AGED OUT OF  MAMMOGRAM, COLONOSCOPY; DECLINES BDS

## 2024-09-08 NOTE — Patient Instructions (Addendum)
 Lori Harrell,  Thank you for taking the time for your Medicare Wellness Visit. I appreciate your continued commitment to your health goals. Please review the care plan we discussed, and feel free to reach out if I can assist you further.  Please note that Annual Wellness Visits do not include a physical exam. Some assessments may be limited, especially if the visit was conducted virtually. If needed, we may recommend an in-person follow-up with your provider.  Ongoing Care Seeing your primary care provider every 3 to 6 months helps us  monitor your health and provide consistent, personalized care. You have an appointment w/ Dr.Liang on 10/01/24 @ 8:40 a.m.  Referrals If a referral was made during today's visit and you haven't received any updates within two weeks, please contact the referred provider directly to check on the status.  Recommended Screenings: NEEDS TDAP  Health Maintenance  Topic Date Due   Zoster (Shingles) Vaccine (1 of 2) Never done   DTaP/Tdap/Td vaccine (2 - Td or Tdap) 10/01/2020   COVID-19 Vaccine (4 - 2025-26 season) 05/31/2024   Hemoglobin A1C  11/29/2024   Yearly kidney function blood test for diabetes  12/30/2024   Yearly kidney health urinalysis for diabetes  12/30/2024   Complete foot exam   12/30/2024   Eye exam for diabetics  06/30/2025   Medicare Annual Wellness Visit  09/08/2025   Pneumococcal Vaccine for age over 21  Completed   Flu Shot  Completed   Osteoporosis screening with Bone Density Scan  Completed   Meningitis B Vaccine  Aged Out   Breast Cancer Screening  Discontinued   Hepatitis C Screening  Discontinued       09/08/2024   11:36 AM  Advanced Directives  Does Patient Have a Medical Advance Directive? Yes  Type of Estate Agent of [place];Living will  Does patient want to make changes to medical advance directive? No - Patient declined  Copy of Healthcare Power of Attorney in Chart? Yes - validated most recent copy  scanned in chart (See row information)    Vision: Annual vision screenings are recommended for early detection of glaucoma, cataracts, and diabetic retinopathy. These exams can also reveal signs of chronic conditions such as diabetes and high blood pressure.  Dental: Annual dental screenings help detect early signs of oral cancer, gum disease, and other conditions linked to overall health, including heart disease and diabetes.  Please see the attached documents for additional preventive care recommendations.   NEXT AWV 09/29/25 @ 11:20 AM IN PERSON

## 2024-09-16 NOTE — Progress Notes (Addendum)
 Chief Complaint  Patient presents with   Medicare Wellness     Subjective:   Lori Harrell is a 83 y.o. female who presents for a Medicare Annual Wellness Visit.  Visit info / Clinical Intake: Medicare Wellness Visit Type:: Subsequent Annual Wellness Visit Persons participating in visit and providing information:: patient Medicare Wellness Visit Mode:: Telephone If telephone:: video declined Since this visit was completed virtually, some vitals may be partially provided or unavailable. Missing vitals are due to the limitations of the virtual format.: Unable to obtain vitals - no equipment If Telephone or Video please confirm:: I connected with patient using audio/video enable telemedicine. I verified patient identity with two identifiers, discussed telehealth limitations, and patient agreed to proceed. Patient Location:: HOME Provider Location:: OFFICE Interpreter Needed?: No Pre-visit prep was completed: yes AWV questionnaire completed by patient prior to visit?: no Living arrangements:: (!) lives alone Patient's Overall Health Status Rating: good Typical amount of pain: some (STOMACH PAIN) Does pain affect daily life?: no Are you currently prescribed opioids?: no  Dietary Habits and Nutritional Risks How many meals a day?: 3 Eats fruit and vegetables daily?: yes Most meals are obtained by: preparing own meals (MOSTLY FROZEN MEALS) In the last 2 weeks, have you had any of the following?: none Diabetic:: (!) yes (FRUIT ONLY) Any non-healing wounds?: no How often do you check your BS?: 1 (EVERY MORNING) Would you like to be referred to a Nutritionist or for Diabetic Management? : no  Functional Status Activities of Daily Living (to include ambulation/medication): Independent Ambulation: Independent with device- listed below Home Assistive Devices/Equipment: Cane Medication Administration: Independent Home Management (perform basic housework or laundry):  Independent Manage your own finances?: yes Primary transportation is: driving Concerns about vision?: no *vision screening is required for WT[facility]* ([facility]- DR.WOODARD) Concerns about hearing?: no  Fall Screening Falls in the past year?: 0 Number of falls in past year: 0 Was there an injury with Fall?: 0 Fall Risk Category Calculator: 0 Patient Fall Risk Level: Low Fall Risk  Fall Risk Patient at Risk for Falls Due to: No Fall Risks Fall risk Follow up: Falls evaluation completed; Falls prevention discussed  Home and Transportation Safety: All rugs have non-skid backing?: yes All stairs or steps have railings?: N/A, no stairs Grab bars in the bathtub or shower?: yes Have non-skid surface in bathtub or shower?: yes Good home lighting?: yes Regular seat belt use?: yes Hospital stays in the last year:: no  Cognitive Assessment Difficulty concentrating, remembering, or making decisions? : no Will 6CIT or Mini Cog be Completed: yes What year is it?: 0 points What month is it?: 0 points Give patient an address phrase to remember (5 components): [address] [place], [place] About what time is it?: 0 points Count backwards from 20 to 1: 0 points Say the months of the year in reverse: 0 points Repeat the address phrase from earlier: 2 points 6 CIT Score: 2 points  Advance Directives (For Healthcare) Does Patient Have a Medical Advance Directive?: Yes Does patient want to make changes to medical advance directive?: No - Patient declined Type of Advance Directive: Healthcare Power of [place]; Living will Copy of Healthcare Power of Attorney in Chart?: Yes - validated most recent copy scanned in chart (See row information) Copy of Living Will in Chart?: Yes - validated most recent copy scanned in chart (See row information)  Reviewed/Updated  Reviewed/Updated: Reviewed All (Medical, Surgical, Family, Medications, Allergies, Care Teams, Patient Goals)  Allergies  (verified) Patient has no known allergies.   Current Medications (verified) Outpatient Encounter Medications as of 09/08/2024  Medication Sig   acetaminophen  (TYLENOL ) 500 MG tablet Take 1,000 mg by mouth 2 (two) times daily.   alendronate  (FOSAMAX ) 70 MG tablet Take 1 tablet (70 mg total) by mouth every 7 (seven) days. Take with a full glass of water  on an empty stomach.   atorvastatin  (LIPITOR) 20 MG tablet TAKE 1 TABLET DAILY   dorzolamide -timolol  (COSOPT ) 22.3-6.8 MG/ML ophthalmic solution Place 1 drop into both eyes 2 (two) times daily.   esomeprazole  (NEXIUM ) 40 MG capsule TAKE 1 CAPSULE TWICE A DAY   FREESTYLE LITE test strip USE TO TEST BLOOD SUGAR DAILY   Lancets (FREESTYLE) lancets USE 1 LANCET DAILY   latanoprost  (XALATAN ) 0.005 % ophthalmic solution Place 1 drop into both eyes at bedtime.   lisinopril  (ZESTRIL ) 20 MG tablet Take 1 tablet (20 mg total) by mouth daily.   Menthol , Topical Analgesic, (BIOFREEZE) 10 % LIQD Apply 1 application. topically 3 (three) times daily as needed (pain.).   metFORMIN  (GLUCOPHAGE ) 500 MG tablet TAKE 1 TABLET DAILY   Multiple Vitamin (MULTIVITAMIN WITH MINERALS) TABS tablet Take 1 tablet by mouth in the morning. Women's Centrum Silver   Multiple Vitamins-Minerals (PRESERVISION AREDS 2 PO) Take by mouth 2 (two) times daily.   nystatin  (MYCOSTATIN ) 100000 UNIT/ML suspension 1 tsp - swish, gargle and spit 4 times a day   spironolactone  (ALDACTONE ) 25 MG tablet Take 1 tablet (25 mg total) by mouth daily.   polycarbophil (FIBERCON) 625 MG tablet Take 625 mg by mouth daily. (Patient not taking: Reported on 09/08/2024)   No facility-administered encounter medications on file as of 09/08/2024.    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   Barrett's esophagus    Basal cell carcinoma (BCC) of skin of lip 05/31/2021    Choledocholithiasis    Diabetes mellitus without complication (HCC)    type 2   Elevated lipids    Glaucoma    Heart murmur    History of detached retina repair    History of hiatal hernia    Hypercholesterolemia    Hypertension    Osteoporosis    Sigmoid diverticulitis    Unstageable pressure ulcer of back (HCC) 01/07/2019   Vitreous hemorrhage, right eye (HCC) 1992   Past Surgical History:  Procedure Laterality Date   ANTERIOR LATERAL LUMBAR FUSION WITH PERCUTANEOUS SCREW 2 LEVEL N/A 05/19/2019   Procedure: L2-4 LATERAL LUMBAR INTERBODY FUSION (XLIF);  Surgeon: Clois Fret, MD;  Location: [facility] ORS;  Service: Neurosurgery;  Laterality: N/A;   CATARACT EXTRACTION Bilateral    CHOLECYSTECTOMY, LAPAROSCOPIC  02/05/2019   [facility]   COLONOSCOPY  2007   sigmoid diverticulosis   ENDOSCOPIC RETROGRADE CHOLANGIOPANCREATOGRAPHY (ERCP) WITH PROPOFOL  N/A 02/02/2019   Procedure: ENDOSCOPIC RETROGRADE CHOLANGIOPANCREATOGRAPHY (ERCP) WITH PROPOFOL ;  Surgeon: Jinny Carmine, MD;  Location: [facility] ENDOSCOPY;  Service: Endoscopy;  Laterality: N/A;   ESOPHAGOGASTRODUODENOSCOPY  04/22/2016   ESOPHAGOGASTRODUODENOSCOPY  04/26/2019   ESOPHAGOGASTRODUODENOSCOPY  09/15/2019   ESOPHAGOGASTRODUODENOSCOPY N/A 06/18/2024   Procedure: EGD (ESOPHAGOGASTRODUODENOSCOPY);  Surgeon: Maryruth Ole DASEN, MD;  [facility];  Service: Endoscopy;  Laterality: N/A;   REVERSE SHOULDER ARTHROPLASTY Left 02/18/2022   Procedure: Left reverse shoulder arthroplasty, biceps tenodesis;  Surgeon: Tobie Priest, MD;  Location: [facility] ORS;  Service: Orthopedics;  Laterality: Left;  TONSILLECTOMY     UPPER GASTROINTESTINAL ENDOSCOPY  02/2013   Barrett's - done at [facility]   VAGINAL HYSTERECTOMY     Family History  Problem Relation Age of Onset   Diabetes Father    CAD Father    Breast cancer Neg Hx    Social History   Occupational History   Occupation: Retired  Tobacco Use   Smoking status: Never   Smokeless  tobacco: Never   Tobacco comments:    smoking cessation materials not required  Vaping Use   Vaping status: Never Used  Substance and Sexual Activity   Alcohol use: No    Alcohol/week: 0.0 standard drinks of alcohol   Drug use: No   Sexual activity: Not Currently   Tobacco Counseling Counseling given: Not Answered Tobacco comments: smoking cessation materials not required  SDOH Screenings   Food Insecurity: No Food Insecurity (09/08/2024)  Housing: Unknown (09/08/2024)  Transportation Needs: No Transportation Needs (09/08/2024)  Utilities: Not At Risk (09/08/2024)  Alcohol Screen: Low Risk (07/23/2023)  Depression (PHQ2-9): Low Risk (09/08/2024)  Financial Resource Strain: Low Risk  (03/29/2024)   Received from [facility]  Physical Activity: Sufficiently Active (09/08/2024)  Social Connections: Moderately Isolated (09/08/2024)  Stress: No Stress Concern Present (09/08/2024)  Tobacco Use: Low Risk (09/08/2024)  Health Literacy: Adequate Health Literacy (09/08/2024)   See flowsheets for full screening details  Depression Screen PHQ 2 & 9 Depression Scale- Over the past 2 weeks, how often have you been bothered by any of the following problems? Little interest or pleasure in doing things: 0 Feeling down, depressed, or hopeless (PHQ Adolescent also includes...irritable): 0 PHQ-2 Total Score: 0 Trouble falling or staying asleep, or sleeping too much: 0 Feeling tired or having little energy: 0 Poor appetite or overeating (PHQ Adolescent also includes...weight loss): 0 Feeling bad about yourself - or that you are a failure or have let yourself or your family down: 0 Trouble concentrating on things, such as reading the newspaper or watching television (PHQ Adolescent also includes...like school work): 0 Moving or speaking so slowly that other people could have noticed. Or the opposite - being so fidgety or restless that you have been moving around a lot more than  usual: 0 Thoughts that you would be better off dead, or of hurting yourself in some way: 0 PHQ-9 Total Score: 0 If you checked off any problems, how difficult have these problems made it for you to do your work, take care of things at home, or get along with other people?: Not difficult at all  Depression Treatment Depression Interventions/Treatment : EYV7-0 Score <4 Follow-up Not Indicated     Goals Addressed             This Visit's Progress    DIET - REDUCE SUGAR INTAKE               Objective:    There were no vitals filed for this visit. There is no height or weight on file to calculate BMI.  Hearing/Vision screen Hearing Screening - Comments:: NO AIDS Vision Screening - Comments:: [facility]- DR.IVEY WOODARD Immunizations and Health Maintenance Health Maintenance  Topic Date Due   Zoster Vaccines- Shingrix (1 of 2) Never done   DTaP/Tdap/Td (2 - Td or Tdap) 10/01/2020   COVID-19 Vaccine (4 - 2025-26 season) 05/31/2024   HEMOGLOBIN A1C  11/29/2024   Diabetic kidney evaluation - eGFR measurement  12/30/2024   Diabetic kidney evaluation - Urine ACR  12/30/2024  FOOT EXAM  12/30/2024   OPHTHALMOLOGY EXAM  06/30/2025   Medicare Annual Wellness (AWV)  09/08/2025   Pneumococcal Vaccine: 50+ Years  Completed   Influenza Vaccine  Completed   Bone Density Scan  Completed   Meningococcal B Vaccine  Aged Out   Mammogram  Discontinued   Hepatitis C Screening  Discontinued        Assessment/Plan:  This is a routine wellness examination for Shontae.  Patient Care Team: Lemon Raisin, MD as PCP - General (Internal Medicine) Charlean, Toribio Elbe, MD as Consulting Physician (Gastroenterology) Leora Lynwood SAUNDERS, MD as Consulting Physician (Orthopedic Surgery) Clois Fret, MD as Consulting Physician (Neurosurgery) Amy Lu (Ophthalmology) [facility], [facility]  I have personally reviewed and noted the following in the patients chart:   Medical and social  history Use of alcohol, tobacco or illicit drugs  Current medications and supplements including opioid prescriptions. Functional ability and status Nutritional status Physical activity Advanced directives List of other physicians Hospitalizations, surgeries, and ER visits in previous 12 months Vitals Screenings to include cognitive, depression, and falls Referrals and appointments  No orders of the defined types were placed in this encounter.  In addition, I have reviewed and discussed with patient certain preventive protocols, quality metrics, and best practice recommendations. A written personalized care plan for preventive services as well as general preventive health recommendations were provided to patient.   Jhonnie GORMAN Das, LPN   87/89/74   Return in 1 year (on 09/08/2025).  After Visit Summary: printed & mailed to patient  Nurse Notes: NEEDS TDAP; DECLINES SHINGRIX;AGED OUT OF  MAMMOGRAM, COLONOSCOPY; DECLINES BDS

## 2024-10-01 ENCOUNTER — Ambulatory Visit: Admitting: Student

## 2024-10-01 ENCOUNTER — Encounter: Payer: Self-pay | Admitting: Student

## 2024-10-01 VITALS — BP 138/74 | HR 60 | Temp 98.3°F | Ht 61.0 in | Wt 127.0 lb

## 2024-10-01 DIAGNOSIS — Z7984 Long term (current) use of oral hypoglycemic drugs: Secondary | ICD-10-CM | POA: Diagnosis not present

## 2024-10-01 DIAGNOSIS — H401130 Primary open-angle glaucoma, bilateral, stage unspecified: Secondary | ICD-10-CM

## 2024-10-01 DIAGNOSIS — E1169 Type 2 diabetes mellitus with other specified complication: Secondary | ICD-10-CM

## 2024-10-01 DIAGNOSIS — E118 Type 2 diabetes mellitus with unspecified complications: Secondary | ICD-10-CM | POA: Diagnosis not present

## 2024-10-01 DIAGNOSIS — M810 Age-related osteoporosis without current pathological fracture: Secondary | ICD-10-CM

## 2024-10-01 DIAGNOSIS — E785 Hyperlipidemia, unspecified: Secondary | ICD-10-CM

## 2024-10-01 DIAGNOSIS — I10 Essential (primary) hypertension: Secondary | ICD-10-CM | POA: Diagnosis not present

## 2024-10-01 DIAGNOSIS — K2270 Barrett's esophagus without dysplasia: Secondary | ICD-10-CM

## 2024-10-01 NOTE — Assessment & Plan Note (Addendum)
 Well controlled spironolactone  25 mg daily and lisinopril  20 mg daily. Does not check BP at home. No significant side effects to medication. CMP today.

## 2024-10-01 NOTE — Patient Instructions (Addendum)
 Please make sure you are taking vitamin D  1000 units in addition to calcium  1200 mg daily to help with osteoporosis You can looks at this video on exercises to help strengthen your bones [email or web address].

## 2024-10-01 NOTE — Assessment & Plan Note (Addendum)
 Lipid Panel     Component Value Date/Time   CHOL 152 12/31/2023 0949   TRIG 122 12/31/2023 0949   HDL 42 12/31/2023 0949   CHOLHDL 3.6 12/31/2023 0949   LDLCALC 88 12/31/2023 0949   LABVLDL 22 12/31/2023 0949  Currently on atorvastatin  20 mg daily. Repeat lipid panel at next visit.

## 2024-10-01 NOTE — Assessment & Plan Note (Addendum)
 Currently on on alendronate  70 mg daily, tolerating well. Reviewed adminsitration instruction. Is taking caclium, not sure if she is taking vitamin D  will check and restart if not current taking this.

## 2024-10-01 NOTE — Progress Notes (Signed)
 "  Established Patient Office Visit  Subjective   Patient ID: Lori Harrell, female    DOB: 29-Feb-1941  Age: 84 y.o. MRN: [id number]  Chief Complaint  Patient presents with   Diabetes   Hypertension   Cough    Since 12/24, getting better, worse in the morning after waking up, taking cough syrup     Lori Harrell is a 84 y.o. person with medical hx listed below who presents today for follow up of diabetes and hypertension. Feels she is getting over a cold and feeling near baseline. Please refer to problem based charting for further details and assessment and plan of current problem and chronic medical conditions.   Patient Active Problem List   Diagnosis Date Noted   SI joint arthritis 08/21/2023   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/27/2022   Long term current use of aspirin  02/21/2022   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   Basal cell carcinoma (BCC) of skin of lip 05/31/2021   Elevated TSH 12/15/2020   S/P lumbar fusion 05/19/2019   Red blood cell antibody positive 02/03/2019   DDD (degenerative disc disease), lumbar 01/07/2019   Carotid artery stenosis, asymptomatic, bilateral 07/07/2017   Primary open angle glaucoma 05/08/2017   Old partial retinal detachment 05/08/2017   Large hiatal hernia 07/04/2016   Sigmoid diverticulitis 07/04/2016   Stress incontinence 07/04/2016   Type II diabetes mellitus with complication (HCC) 10/31/2015   Constipation 10/31/2015   Barrett's esophagus without dysplasia 06/16/2015   Essential (primary) hypertension 06/16/2015   Hyperlipidemia associated with type 2 diabetes mellitus (HCC) 06/16/2015   Age-related osteoporosis without current pathological fracture 06/16/2015      ROS Refer to HPI    Objective:     Outpatient Encounter Medications as of 10/01/2024  Medication Sig Note   acetaminophen  (TYLENOL ) 500 MG tablet Take 1,000 mg by mouth 2 (two) times daily.    alendronate  (FOSAMAX ) 70 MG tablet  Take 1 tablet (70 mg total) by mouth every 7 (seven) days. Take with a full glass of water  on an empty stomach.    atorvastatin  (LIPITOR) 20 MG tablet TAKE 1 TABLET DAILY    dorzolamide -timolol  (COSOPT ) 22.3-6.8 MG/ML ophthalmic solution Place 1 drop into both eyes 2 (two) times daily.    esomeprazole  (NEXIUM ) 40 MG capsule TAKE 1 CAPSULE TWICE A DAY    FREESTYLE LITE test strip USE TO TEST BLOOD SUGAR DAILY    Lancets (FREESTYLE) lancets USE 1 LANCET DAILY    latanoprost  (XALATAN ) 0.005 % ophthalmic solution Place 1 drop into both eyes at bedtime.    lisinopril  (ZESTRIL ) 20 MG tablet Take 1 tablet (20 mg total) by mouth daily.    Menthol , Topical Analgesic, (BIOFREEZE) 10 % LIQD Apply 1 application. topically 3 (three) times daily as needed (pain.).    metFORMIN  (GLUCOPHAGE ) 500 MG tablet TAKE 1 TABLET DAILY    Multiple Vitamin (MULTIVITAMIN WITH MINERALS) TABS tablet Take 1 tablet by mouth in the morning. Women's Centrum Silver    Multiple Vitamins-Minerals (PRESERVISION AREDS 2 PO) Take by mouth 2 (two) times daily.    nystatin  (MYCOSTATIN ) 100000 UNIT/ML suspension 1 tsp - swish, gargle and spit 4 times a day    polycarbophil (FIBERCON) 625 MG tablet Take 625 mg by mouth daily. (Patient taking differently: Take 625 mg by mouth as needed for moderate constipation.) 07/23/2023: Takes PRN   spironolactone  (ALDACTONE ) 25 MG tablet Take 1 tablet (25 mg total) by mouth daily.    No facility-administered  encounter medications on file as of 10/01/2024.    BP 138/74   Pulse 60   Temp 98.3 F (36.8 C) (Oral)   Ht 5' 1 (1.549 m)   Wt 127 lb (57.6 kg)   SpO2 99%   BMI 24.00 kg/m  BP Readings from Last 3 Encounters:  10/01/24 138/74  06/18/24 (!) 148/80  06/01/24 112/74    Physical Exam Constitutional:      Appearance: Normal appearance.  HENT:     Head: Normocephalic and atraumatic.     Mouth/Throat:     Mouth: Mucous membranes are moist.     Pharynx: Oropharynx is clear.   Cardiovascular:     Rate and Rhythm: Normal rate and regular rhythm.     Heart sounds: No murmur heard. Pulmonary:     Effort: Pulmonary effort is normal.     Breath sounds: No rhonchi or rales.  Abdominal:     General: Abdomen is flat. Bowel sounds are normal. There is no distension.     Palpations: Abdomen is soft.     Tenderness: There is no abdominal tenderness.  Musculoskeletal:        General: Normal range of motion.     Right lower leg: No edema.     Left lower leg: No edema.  Skin:    General: Skin is warm and dry.     Capillary Refill: Capillary refill takes less than 2 seconds.  Neurological:     General: No focal deficit present.     Mental Status: She is alert and oriented to person, place, and time.  Psychiatric:        Mood and Affect: Mood normal.        Behavior: Behavior normal.        10/01/2024    8:31 AM 09/08/2024   11:43 AM 06/01/2024    8:39 AM  Depression screen PHQ 2/9  Decreased Interest 0 0 0  Down, Depressed, Hopeless 0 0 0  PHQ - 2 Score 0 0 0  Altered sleeping  0 1  Tired, decreased energy  0 1  Change in appetite  0 0  Feeling bad or failure about yourself   0 0  Trouble concentrating  0 0  Moving slowly or fidgety/restless  0 0  Suicidal thoughts  0 0  PHQ-9 Score  0 2   Difficult doing work/chores  Not difficult at all Not difficult at all     Data saved with a previous flowsheet row definition       10/01/2024    8:31 AM 06/01/2024    8:39 AM 12/31/2023    9:11 AM 04/21/2023    3:18 PM  GAD 7 : Generalized Anxiety Score  Nervous, Anxious, on Edge 0 0 0 0  Control/stop worrying 0 0 0 0  Worry too much - different things  0 0 0  Trouble relaxing  0 0 0  Restless  0 0 0  Easily annoyed or irritable  0 0 0  Afraid - awful might happen  0 0 0  Total GAD 7 Score  0 0 0  Anxiety Difficulty  Not difficult at all Not difficult at all Not difficult at all    Results for orders placed or performed in visit on 10/01/24  Comprehensive  Metabolic Panel (CMET)  Result Value Ref Range   Glucose 102 (H) 70 - 99 mg/dL   BUN 10 8 - 27 mg/dL   Creatinine, Ser 9.24 0.57 - 1.00  mg/dL   eGFR 78 >40 fO/fpw/8.26   BUN/Creatinine Ratio 13 12 - 28   Sodium 137 134 - 144 mmol/L   Potassium 4.8 3.5 - 5.2 mmol/L   Chloride 101 96 - 106 mmol/L   CO2 20 20 - 29 mmol/L   Calcium  9.6 8.7 - 10.3 mg/dL   Total Protein 6.6 6.0 - 8.5 g/dL   Albumin 4.2 3.7 - 4.7 g/dL   Globulin, Total 2.4 1.5 - 4.5 g/dL   Bilirubin Total 0.4 0.0 - 1.2 mg/dL   Alkaline Phosphatase 56 48 - 129 IU/L   AST 25 0 - 40 IU/L   ALT 18 0 - 32 IU/L  HgB A1c  Result Value Ref Range   Hgb A1c MFr Bld 6.3 (H) 4.8 - 5.6 %   Est. average glucose Bld gHb Est-mCnc 134 mg/dL    Last CBC Lab Results  Component Value Date   WBC 7.5 12/31/2023   HGB 12.7 12/31/2023   HCT 38.0 12/31/2023   MCV 98 (H) 12/31/2023   MCH 32.8 12/31/2023   RDW 11.9 12/31/2023   PLT 242 12/31/2023   Last metabolic panel Lab Results  Component Value Date   GLUCOSE 102 (H) 10/01/2024   NA 137 10/01/2024   K 4.8 10/01/2024   CL 101 10/01/2024   CO2 20 10/01/2024   BUN 10 10/01/2024   CREATININE 0.75 10/01/2024   EGFR 78 10/01/2024   CALCIUM  9.6 10/01/2024   PHOS 2.5 02/02/2019   PROT 6.6 10/01/2024   ALBUMIN 4.2 10/01/2024   LABGLOB 2.4 10/01/2024   AGRATIO 1.9 12/17/2022   BILITOT 0.4 10/01/2024   ALKPHOS 56 10/01/2024   AST 25 10/01/2024   ALT 18 10/01/2024   ANIONGAP 5 02/06/2022   Last lipids Lab Results  Component Value Date   CHOL 152 12/31/2023   HDL 42 12/31/2023   LDLCALC 88 12/31/2023   TRIG 122 12/31/2023   CHOLHDL 3.6 12/31/2023   Last hemoglobin A1c Lab Results  Component Value Date   HGBA1C 6.3 (H) 10/01/2024   Last thyroid  functions Lab Results  Component Value Date   TSH 7.930 (H) 12/31/2023   T3TOTAL 98 12/12/2020   T4TOTAL 6.6 12/12/2020   FREET4 1.09 12/31/2023      The ASCVD Risk score (Arnett DK, et al., 2019) failed to calculate for  the following reasons:   The 2019 ASCVD risk score is only valid for ages 62 to 38   * - Cholesterol units were assumed    Assessment & Plan:  Type II diabetes mellitus with complication (HCC) Assessment & Plan: Stable well controlled on metformin  500 mg daily. A1c today.   Orders: -     Hemoglobin A1c  Essential (primary) hypertension Assessment & Plan: Well controlled spironolactone  25 mg daily and lisinopril  20 mg daily. Does not check BP at home. No significant side effects to medication. CMP today.   Orders: -     Comprehensive metabolic panel with GFR  Age-related osteoporosis without current pathological fracture Assessment & Plan: Currently on on alendronate  70 mg daily, tolerating well. Reviewed adminsitration instruction. Is taking caclium, not sure if she is taking vitamin D  will check and restart if not current taking this.    Hyperlipidemia associated with type 2 diabetes mellitus [facility]) Assessment & Plan: Lipid Panel     Component Value Date/Time   CHOL 152 12/31/2023 0949   TRIG 122 12/31/2023 0949   HDL 42 12/31/2023 0949   CHOLHDL 3.6 12/31/2023 0949  LDLCALC 88 12/31/2023 0949   LABVLDL 22 12/31/2023 0949  Currently on atorvastatin  20 mg daily. Repeat lipid panel at next visit.    Long term current use of oral hypoglycemic drug  Barrett's esophagus without dysplasia Assessment & Plan: EDG on 06/18/2024 Barrett's esophagus without dysplasia, hiatal hernia  on pathology, no further EGD surveillance  recommended.       Return in about 4 weeks (around 10/29/2024) for chronic f/u, physical.    Harlene Saddler, MD "

## 2024-10-01 NOTE — Assessment & Plan Note (Addendum)
 Stable well controlled on metformin  500 mg daily. A1c today.

## 2024-10-02 LAB — COMPREHENSIVE METABOLIC PANEL WITH GFR
ALT: 18 IU/L (ref 0–32)
AST: 25 IU/L (ref 0–40)
Albumin: 4.2 g/dL (ref 3.7–4.7)
Alkaline Phosphatase: 56 IU/L (ref 48–129)
BUN/Creatinine Ratio: 13 (ref 12–28)
BUN: 10 mg/dL (ref 8–27)
Bilirubin Total: 0.4 mg/dL (ref 0.0–1.2)
CO2: 20 mmol/L (ref 20–29)
Calcium: 9.6 mg/dL (ref 8.7–10.3)
Chloride: 101 mmol/L (ref 96–106)
Creatinine, Ser: 0.75 mg/dL (ref 0.57–1.00)
Globulin, Total: 2.4 g/dL (ref 1.5–4.5)
Glucose: 102 mg/dL — ABNORMAL HIGH (ref 70–99)
Potassium: 4.8 mmol/L (ref 3.5–5.2)
Sodium: 137 mmol/L (ref 134–144)
Total Protein: 6.6 g/dL (ref 6.0–8.5)
eGFR: 78 mL/min/1.73

## 2024-10-02 LAB — HEMOGLOBIN A1C
Est. average glucose Bld gHb Est-mCnc: 134 mg/dL
Hgb A1c MFr Bld: 6.3 % — ABNORMAL HIGH (ref 4.8–5.6)

## 2024-10-04 ENCOUNTER — Ambulatory Visit: Payer: Self-pay | Admitting: Student

## 2024-10-06 NOTE — Assessment & Plan Note (Signed)
 EDG on 06/18/2024 Barrett's esophagus without dysplasia, hiatal hernia  on pathology, no further EGD surveillance  recommended.

## 2024-10-22 ENCOUNTER — Other Ambulatory Visit: Payer: Self-pay

## 2024-10-22 DIAGNOSIS — M810 Age-related osteoporosis without current pathological fracture: Secondary | ICD-10-CM

## 2024-10-22 MED ORDER — ALENDRONATE SODIUM 70 MG PO TABS: 70.0000 mg | ORAL_TABLET | ORAL | 3 refills | Status: AC

## 2024-11-05 ENCOUNTER — Other Ambulatory Visit: Payer: Self-pay

## 2024-11-05 DIAGNOSIS — I10 Essential (primary) hypertension: Secondary | ICD-10-CM

## 2024-11-05 MED ORDER — SPIRONOLACTONE 25 MG PO TABS: 25.0000 mg | ORAL_TABLET | Freq: Every day | ORAL | 0 refills | Status: AC

## 2024-11-05 MED ORDER — LISINOPRIL 20 MG PO TABS: 20.0000 mg | ORAL_TABLET | Freq: Every day | ORAL | 0 refills | Status: AC

## 2024-12-03 ENCOUNTER — Ambulatory Visit: Admitting: Podiatry

## 2025-01-07 ENCOUNTER — Encounter: Admitting: Student

## 2025-07-25 ENCOUNTER — Encounter (INDEPENDENT_AMBULATORY_CARE_PROVIDER_SITE_OTHER): Admitting: Ophthalmology

## 2025-09-29 ENCOUNTER — Ambulatory Visit
